# Patient Record
Sex: Male | Born: 1935 | Race: White | Hispanic: No | Smoking: Former smoker
Health system: Southern US, Community
[De-identification: ages and names within clinical notes are randomized; demographics above are authoritative.]

## PROBLEM LIST (undated history)

## (undated) DIAGNOSIS — N400 Enlarged prostate without lower urinary tract symptoms: Secondary | ICD-10-CM

## (undated) DIAGNOSIS — I10 Essential (primary) hypertension: Secondary | ICD-10-CM

## (undated) DIAGNOSIS — Z9981 Dependence on supplemental oxygen: Secondary | ICD-10-CM

## (undated) DIAGNOSIS — F32A Depression, unspecified: Secondary | ICD-10-CM

## (undated) DIAGNOSIS — K219 Gastro-esophageal reflux disease without esophagitis: Secondary | ICD-10-CM

## (undated) DIAGNOSIS — F329 Major depressive disorder, single episode, unspecified: Secondary | ICD-10-CM

## (undated) DIAGNOSIS — K269 Duodenal ulcer, unspecified as acute or chronic, without hemorrhage or perforation: Secondary | ICD-10-CM

## (undated) DIAGNOSIS — J449 Chronic obstructive pulmonary disease, unspecified: Secondary | ICD-10-CM

## (undated) DIAGNOSIS — J45909 Unspecified asthma, uncomplicated: Secondary | ICD-10-CM

## (undated) HISTORY — PX: HIP HARDWARE REMOVAL: SUR1127

## (undated) HISTORY — PX: GASTRIC ROUX-EN-Y: SHX5262

## (undated) HISTORY — PX: PARTIAL GASTRECTOMY: SHX2172

## (undated) HISTORY — PX: FRACTURE SURGERY: SHX138

## (undated) HISTORY — PX: CATARACT EXTRACTION, BILATERAL: SHX1313

---

## 1998-03-27 ENCOUNTER — Encounter: Payer: Self-pay | Admitting: Emergency Medicine

## 1998-03-28 ENCOUNTER — Encounter: Payer: Self-pay | Admitting: Specialist

## 1998-03-28 ENCOUNTER — Inpatient Hospital Stay (HOSPITAL_COMMUNITY): Admission: EM | Admit: 1998-03-28 | Discharge: 1998-03-30 | Payer: Self-pay | Admitting: Emergency Medicine

## 1998-03-30 ENCOUNTER — Inpatient Hospital Stay (HOSPITAL_COMMUNITY)
Admission: RE | Admit: 1998-03-30 | Discharge: 1998-04-12 | Payer: Self-pay | Admitting: Physical Medicine and Rehabilitation

## 1998-03-30 ENCOUNTER — Encounter: Payer: Self-pay | Admitting: Physical Medicine and Rehabilitation

## 1998-04-02 ENCOUNTER — Encounter: Payer: Self-pay | Admitting: Physical Medicine and Rehabilitation

## 1998-04-04 ENCOUNTER — Encounter: Payer: Self-pay | Admitting: Specialist

## 1998-04-10 ENCOUNTER — Encounter: Payer: Self-pay | Admitting: Physical Medicine and Rehabilitation

## 1998-08-29 ENCOUNTER — Encounter: Admission: RE | Admit: 1998-08-29 | Discharge: 1998-09-28 | Payer: Self-pay | Admitting: Specialist

## 1998-11-02 HISTORY — PX: HIP FRACTURE SURGERY: SHX118

## 2000-08-26 ENCOUNTER — Encounter: Payer: Self-pay | Admitting: Specialist

## 2000-08-28 ENCOUNTER — Encounter: Payer: Self-pay | Admitting: Specialist

## 2000-08-28 ENCOUNTER — Inpatient Hospital Stay (HOSPITAL_COMMUNITY): Admission: RE | Admit: 2000-08-28 | Discharge: 2000-09-01 | Payer: Self-pay | Admitting: Specialist

## 2000-10-13 ENCOUNTER — Encounter: Admission: RE | Admit: 2000-10-13 | Discharge: 2000-10-29 | Payer: Self-pay | Admitting: Specialist

## 2005-08-18 ENCOUNTER — Encounter: Admission: RE | Admit: 2005-08-18 | Discharge: 2005-08-18 | Payer: Self-pay | Admitting: Orthopaedic Surgery

## 2005-09-17 ENCOUNTER — Encounter (INDEPENDENT_AMBULATORY_CARE_PROVIDER_SITE_OTHER): Payer: Self-pay | Admitting: Specialist

## 2005-09-17 ENCOUNTER — Ambulatory Visit (HOSPITAL_COMMUNITY): Admission: RE | Admit: 2005-09-17 | Discharge: 2005-09-18 | Payer: Self-pay | Admitting: Orthopaedic Surgery

## 2007-02-12 ENCOUNTER — Encounter: Admission: RE | Admit: 2007-02-12 | Discharge: 2007-02-12 | Payer: Self-pay | Admitting: Unknown Physician Specialty

## 2009-01-09 ENCOUNTER — Encounter: Admission: RE | Admit: 2009-01-09 | Discharge: 2009-02-21 | Payer: Self-pay | Admitting: Sports Medicine

## 2010-03-24 ENCOUNTER — Encounter: Payer: Self-pay | Admitting: Unknown Physician Specialty

## 2010-05-30 ENCOUNTER — Other Ambulatory Visit: Payer: Self-pay | Admitting: Sports Medicine

## 2010-06-05 ENCOUNTER — Ambulatory Visit
Admission: RE | Admit: 2010-06-05 | Discharge: 2010-06-05 | Disposition: A | Discharge status: Home / Self Care | Payer: Medicare Other | Source: Ambulatory Visit | Attending: Sports Medicine | Admitting: Sports Medicine

## 2010-07-19 NOTE — H&P (Signed)
[place]. [facility]  Patient:    Neil Terry, Neil Terry                         MRN: [id number] Adm. Date:  08/28/00 Attending:  Kerrin Champagne, M.D. Dictator:   Verlin Fester, P.A.-C. CC:         Minna Antis, P.A.-C.; [facility]   History and Physical  DATE OF BIRTH:  12/21/1927  CHIEF COMPLAINT:  Left hip pain.  HISTORY OF PRESENT ILLNESS:  Left hip pain secondary to a compression screw and sideplate placement about 2 years and 4 months ago.  This has been causing him significant pain and irritation since that point and has not improved significantly.  It is now interfering with the quality of his living and he desires to have it removed.  ALLERGIES:  No known drug allergies.  MEDICATIONS:  Darvocet p.r.n. pain, over-the-counter laxatives.  PAST MEDICAL HISTORY:  Patient is healthy.  Denies any medical problems.  PAST SURGICAL HISTORY:  Hernia repair 30-40 years ago.  SOCIAL HISTORY:  He denies current tobacco use; however, he did quit smoking two years ago.  Previous to that he had smoked three to four packs per day. He denies any alcohol use.  He lives locally.  He has five children who live locally and he rotates between living with all of them.  They will all be around and willing to help him and support him after surgery with his needs.  FAMILY HISTORY:  Unknown secondary to his family living in [place] and it is unknown as to what the causes of death of his parents were.  All of his children are healthy.  REVIEW OF SYSTEMS:  Patient denies fever, chills, night sweats, or bleeding tendencies.  He denies any recent blurred vision, double vision, seizures, headaches, or paralysis.  Denies any shortness of breath, productive cough, or hemoptysis.  Denies chest pain, angina, orthopnea, or claudication.  Denies nausea, vomiting, diarrhea, constipation, melena, or blood stool.  Denies dysuria, hematuria, discharge.  Denies any  paraesthesias or numbness in the extremities or any weakness in his extremities.  PHYSICAL EXAMINATION  GENERAL:  Patient is a 77 year old male who is alert and oriented in no acute distress cooperative and pleasant to examination.  VITAL SIGNS:  Blood pressure 122/82, respirations 14 and unlabored, pulse 60 and irregular.  HEENT:  Head is normocephalic, nontraumatic.  Extraocular movements are intact.  NECK:  No bruits appreciated.  Soft and supple to palpation.  No thyromegaly.  CHEST:  Clear to auscultation bilaterally anterior and posterior.  BREASTS:  Not pertinent and not performed.  HEART:  Irregularly irregular rhythm.  No murmurs, gallops, or rubs appreciated.  Patient denied any knowledge of any irregular heartbeat.  ABDOMEN:  Positive bowel sounds throughout, nontender, nondistended.  No organomegaly noted.  Soft and supple to palpation.  GENITOURINARY:  Not pertinent and not performed.  EXTREMITIES:  Range of motion of the left hip is pain-free.  He is tender directly over the area of left compression screw and sideplate.  Reflexes at the knee are 2+ and equal bilaterally and at the ankle are present and equal bilaterally.  There is no motor deficits appreciated.  SKIN:  Intact without any rashes or lesions.  LABORATORIES:  X-rays reveal very prominent of plate over the lateral aspect of proximal femur on the hip and a healed intertrochanteric fracture.  PLAN:  Admit to [facility] for  removal of plate and screws and Allograft cancellous bone grafting to the hip.  This is to be done by Dr. Vira Browns.  I spoke with the patient and his son regarding the irregular heart rhythm and asked him to contact their primary care physician who I believe is Minna Antis, P.A.-C. to have his recent EKG faxed to [place] here so we can compare that to his pre admission EKG.  IMPRESSION: 1. Irritation secondary to a left hip compression screw and sideplate. 2.  Irregularly irregular heartbeat which was previously undiagnosed as far as    the patient is aware. DD:  08/24/00 TD:  08/24/00 Job: [id number]

## 2010-07-19 NOTE — Discharge Summary (Signed)
[place]. [facility]  Patient:    Neil Terry, Neil Terry                         MRN: [id number] Adm. Date:  08/28/00 Disc. Date: 09/01/00 Attending:  Kerrin Champagne, M.D. Dictator:   Dorie Rank, P.A.                           Discharge Summary  ADMISSION DIAGNOSES: 1. Painful hardware to the left lateral hip. 2. Gastroesophageal reflux disease.  DISCHARGE DIAGNOSES: 1. Status post removal of hardware to the left hip with allograft bone graft    to the left proximal femur. 2. Gastroesophageal reflux disease.  PROCEDURES:  On August 28, 2000, the patient underwent removal of deep hardware to the left proximal femur, compression screw and sideplate with allograft bone graft to the proximal femur.  The surgeon was Kerrin Champagne, M.D.  The anesthesia was general.  The assistant was ______, P.A.-C.  The estimated blood loss was 50 cc.  COMPLICATIONS:  None.  CONSULTS:  None.  BRIEF HISTORY:  The patient is a 77 year old male who sustained a fracture of the left hip almost two years ago.  He was treated initially with open reduction and internal fixation using compression screw and sideplate.  He did well postoperatively [place]  Several months later, he returned back with compression fractures and was noted to have osteoporosis.  He was then treated with IV ______.  He was admitted for the above-stated procedure due to the fact that he had persistent pain over the left proximal thigh in the region of his internal fixation with compression screw and sideplate.  After radiographs revealed healing of the intertrochanteric fracture, he was brought to the operating room for removal.  HOSPITAL COURSE:  The patient was brought to the operating room on August 28, 2000, and had the above-stated procedure without complications. Postoperatively, he was utilizing IV morphine for pain.  Prior to discharge, he was utilizing Percocet, which was  adequately controlling his pain.  He was 50% partial weightbearing to the left lower extremity postoperatively. Initially an IV of D5 normal saline was utilized.  This was KVO by postoperative day #2 when he was taking p.o. well.  He was given a regular diet and tolerated this well.  He utilized TED hose for DVT prophylaxis.  He was also placed on Lovenox for DVT prophylaxis while in the hospital. Physical therapy worked with him for ambulation.  The wound was dressed in a sterile fashion while in the operating room.  On postoperative day #1, the dressing was clean, dry, and intact.  The dressing was changed postoperative day #2 and daily thereafter and found to be free of any erythema or drainage. IV Ancef was used for 24 hours for postoperative infection control.  The patient progressed well with physical therapy and was ambulating up to 100 feet prior to discharge.  He did have some swelling to the left thigh, so a bilateral venous Doppler was obtained and was found to be negative. Discharge planning worked with him to obtain a home health agency for physical therapy and durable medical equipment.  On September 01, 2000, he was felt to be medically and orthopedically stable for discharge.  LABORATORY VALUES:  A CBC on August 26, 2000, was within normal limits.  PT and INR on August 26, 2000, were within normal  limits.  A BMET on August 26, 2000, was normal.  Liver functions on August 26, 2000, were normal.  A UA on August 26, 2000, was negative.  RADIOLOGY:  Left hip x-ray status post hardware removal on August 28, 2000, revealed satisfactory appearance of the left hip after hardware removal.  No EKG on chart.  DISCHARGE PLANS AND MEDICATIONS:  The patient was to be discharged home.  He was to have home health physical therapy for 50% partial weightbearing to the left lower extremity.  He is to follow up with Kerrin Champagne, M.D., in 10 days; call for an appointment.  Diet was regular.  He was  given prescriptions for Percocet and Robaxin.  He was to take over-the-counter aspirin for DVT prophylaxis.  Daily dressing changes.  He was to apply the Ace wrap to the left thigh as needed for swelling.  For any drainage or foul smell to the wound to the left hip, call the office immediately.  He was to take Tylenol for fevers and if greater than 101 degrees, call the office.  CONDITION ON DISCHARGE:  Improved. DD:  09/14/00 TD:  09/14/00 Job: [id number]

## 2010-07-19 NOTE — Op Note (Signed)
[name], [name] NO.:  [id number]   MEDICAL RECORD NO.:  [id number]          PATIENT TYPE:  OIB   LOCATION:  5041                         FACILITY:  [facility]   PHYSICIAN:  Sharolyn Douglas, M.D.        DATE OF BIRTH:  09-03-1927   DATE OF PROCEDURE:  DATE OF DISCHARGE:                                 OPERATIVE REPORT   DIAGNOSIS:  Lumbar compression fractures L2, L3 and L4.   PROCEDURE:  1.  Kyphoplasty L2, L3 and L4.  2.  Radiographic interpretation of fluoroscopic images used for instrument      placement during kyphoplasty.   SURGEON:  Sharolyn Douglas, MD   ASSISTANT:  None.   ANESTHESIA:  General endotracheal.   ESTIMATED BLOOD LOSS:  Minimal.   COMPLICATIONS:  None.   NEEDLE AND SPONGE COUNT:  Correct.   SPECIMEN TO PATHOLOGY:  From the L2, L3 and L4 vertebral bodies.   INDICATIONS:  The patient is a pleasant 77 year old ethnic male with  persistent back pain.  His radiographs and MRI scan show subacute  compression fractures at L2, L3 and L4.  Presents for kyphoplasty at these  levels in hopes of improving his symptoms.  Risk and benefits were reviewed.   PROCEDURE:  The patient was identified in the holding area, taken to the  operating room underwent general endotracheal anesthesia without difficulty,  given prophylactic IV antibiotics.  Carefully turned prone with bolsters  placed under his hips and pelvis and the chest to allow for a postural  reduction of his deformities.  The back was prepped, draped usual sterile  fashion.  Biplanar fluoroscopy was brought into the field.  True AP and  lateral images of the L2, L3 and L4 vertebral bodies were obtained.  A small  stab incisions were made to the lateral aspect of the left L3-L4 pedicles  and to the lateral aspect of the right L2 pedicle.  Jamshidi needles were  used to cannulate the pedicles of the L2-L3 and L4 vertebral bodies using  biplanar fluoroscopy.  A guidewire was placed through the  Jamshidi needle  and then a working cannula established.  Core bone biopsies were taken  through the working cannula and sent to pathology at each level.  We then  inserted the intervertebral bone tamp to the working cannula and inflated  each tamp to 6 mL.  We observed a partial reduction of vertebral endplates.  Partially hardened methyl methacrylate cement was then pushed through the  working cannulas into the vertebral bodies.  A total of 6 mL of bone cement  was utilized at each level.  The working cannulas were removed and final AP  and lateral images saved.  The skin was closed using a simple nylon suture at each stab incision site.  Sterile dressing was applied.  The patient was turned supine, extubated  without difficulty and transferred to recovery in stable condition able to  move his upper and lower extremities.      Sharolyn Douglas, M.D.  Electronically Signed     MC/MEDQ  D:  09/17/2005  T:  09/18/2005  Job:  [id number]

## 2010-07-19 NOTE — Op Note (Signed)
[place]. [facility]  Patient:    Neil Terry, Neil Terry                         MRN: [id number] Proc. Date: 08/28/00 Adm. Date:  [id number] Attending:  Lubertha South                           Operative Report  PREOPERATIVE DIAGNOSIS:  Left intertrochanteric hip fracture open reduction internal fixation on 03/28/00, now healed with painful hardware.  POSTOPERATIVE DIAGNOSIS:  Bursa overlying left intertrochanteric hip fracture open reduction internal fixation with plate and screws, healed left intertrochanteric hip fracture.  PROCEDURE PERFORMED: 1. Removal of deep hardware left proximal femur (compression plate    and side screw with five screws). 2. Allograft bone grafting to the proximal femur.  SURGEON:  Kerrin Champagne, M.D.  ASSISTANT:  Haze Rushing, P.A.-C.  ANESTHESIA:  General orotracheal tube.  ANESTHESIOLOGIST:  Halford Decamp, M.D.  ESTIMATED BLOOD LOSS:  50 cc.  INDICATIONS:  A 77 year old male who sustained a fracture of his left hip in a fall almost two years ago.  He was treated initially with open reduction internal fixation using compression screw and side plate.  He did well postoperatively [place] and several months later returned back with compression fractures and noted to have osteoporosis.  He is now treated with IV biphosphanate treatment.  He is brought to the operating room because of persistent pain over the left proximal thigh in the region of his internal fixation with compression screw and side plate.  He has an injection, which only temporarized his discomfort.  He is brought back to the operating room to have removal of hardware after radiographic demonstration of his healed intertrochanteric fracture.  FINDINGS:  The patient was found to have bursa overlying the left intertrochanteric fixation device.  This was resected as best as possible. Plates and screws were removed along the  compression screw without difficulty.  DESCRIPTION OF PROCEDURE:  After adequate general anesthesia with the patient in the right lateral decubitus position, left side up, chest roll, all pressure points well padded.  The standard prep with Duraprep solution from the left knee to the lower rib margin and draped in the usual manner.  The incision through the old previous incision scar approximately 10-12 cm in length.  Down through the skin and subcutaneous layers down to the tensor fascia lata laterally.  This was then incised in line with its fibers laterally.  The interval developed between the fascia lata and the superficial fascia layer of the vastus lateralis.  The vastus lateralis then incised along its posterior border and then blunt dissection used to carefully reflect the vastus lateralis anteriorly off the plate and screws.  An incision was then made over the plate and a screw through the bursa found to be present using electrocautery, and then a Cobb elevator used to elevate this circumferentially.  Each of the five screws were then individually removed with 4.5 screwdriver.  The plate then loosed off the lateral aspect of the femur using a Cobb elevator and mallet.  Care was taken not to cause any bony abnormality with this.  The plate was then carefully removed.  The compression screw was removed using the T-handle inserting screwdriver.  This was first turned one turn and then backed out of the proximal large hole in the femur without difficulty.  Irrigation was then performed, then the bursa cavity overlying the previous plate was resected using electrocautery and each of the individual screw holes were debrided using a bare rongeur.  The edges that had bone that had built around the plate were then smoothed with the bare rongeur, but basically left intact as they as they provided some degree of longitudinal support and I-beam type tensioning to the lateral column of the  hip.  The proximal hole for the compression screw was then treated with curettage and then irrigation, and then allograft bone graft packed through a 10 cc syringe with the end of it cut off, and impacted into place.  Excess bone then removed.  Irrigation performed and then the superficial fascia layer of the vastus lateralis reapproximated with a running stitch of 0 Vicryl.  The tensor fascia lata was approximated with a running stitch of #1 Vicryl.  The subcu layer was approximated with interrupted 0 Vicryl suture and the more superficial layers with interrupted 2-0 Vicryl sutures.  The skin was closed with a running stitch of 4-0 Monocryl buried at each end.  Tincture of Benzoin and Steri-Strips were applied with 4 x 4s, ABD pads fixed to the skin with tape.  All instrument and sponge counts were correct.  The plates and screws were flashed and then placed on the patients chart to be returned to the patient.  The patient was then reactivated and returned to the recovery room in satisfactory condition. DD:  08/28/00 TD:  08/28/00 Job: [id number]

## 2010-07-19 NOTE — Op Note (Signed)
[name], Neil Terry NO.:  [id number]   MEDICAL RECORD NO.:  [id number]          PATIENT TYPE:  OIB   LOCATION:  5041                         FACILITY:  [facility]   PHYSICIAN:  Sharolyn Douglas, M.D.        DATE OF BIRTH:  10-21-1927   DATE OF PROCEDURE:  DATE OF DISCHARGE:                                 OPERATIVE REPORT      Max Noel Gerold, M.D.  Electronically Signed     MC/MEDQ  D:  09/17/2005  T:  09/18/2005  Job:  [id number]

## 2011-04-25 DIAGNOSIS — H04129 Dry eye syndrome of unspecified lacrimal gland: Secondary | ICD-10-CM | POA: Diagnosis not present

## 2011-05-28 DIAGNOSIS — M25569 Pain in unspecified knee: Secondary | ICD-10-CM | POA: Diagnosis not present

## 2011-05-28 DIAGNOSIS — M1710 Unilateral primary osteoarthritis, unspecified knee: Secondary | ICD-10-CM | POA: Diagnosis not present

## 2011-07-12 DIAGNOSIS — M13169 Monoarthritis, not elsewhere classified, unspecified knee: Secondary | ICD-10-CM | POA: Diagnosis not present

## 2012-02-23 HISTORY — PX: ABDOMINAL EXPLORATION SURGERY: SHX538

## 2013-03-10 DIAGNOSIS — R141 Gas pain: Secondary | ICD-10-CM | POA: Diagnosis not present

## 2013-03-10 DIAGNOSIS — K439 Ventral hernia without obstruction or gangrene: Secondary | ICD-10-CM | POA: Diagnosis not present

## 2013-03-17 DIAGNOSIS — R109 Unspecified abdominal pain: Secondary | ICD-10-CM | POA: Diagnosis not present

## 2013-03-17 DIAGNOSIS — K432 Incisional hernia without obstruction or gangrene: Secondary | ICD-10-CM | POA: Diagnosis not present

## 2013-03-25 DIAGNOSIS — N1330 Unspecified hydronephrosis: Secondary | ICD-10-CM | POA: Diagnosis not present

## 2013-03-25 DIAGNOSIS — R109 Unspecified abdominal pain: Secondary | ICD-10-CM | POA: Diagnosis not present

## 2013-03-25 DIAGNOSIS — K432 Incisional hernia without obstruction or gangrene: Secondary | ICD-10-CM | POA: Diagnosis not present

## 2013-04-01 DIAGNOSIS — N134 Hydroureter: Secondary | ICD-10-CM | POA: Diagnosis not present

## 2013-04-06 DIAGNOSIS — R339 Retention of urine, unspecified: Secondary | ICD-10-CM | POA: Diagnosis not present

## 2013-04-06 DIAGNOSIS — N401 Enlarged prostate with lower urinary tract symptoms: Secondary | ICD-10-CM | POA: Diagnosis not present

## 2013-04-06 DIAGNOSIS — N138 Other obstructive and reflux uropathy: Secondary | ICD-10-CM | POA: Diagnosis not present

## 2013-04-20 DIAGNOSIS — N390 Urinary tract infection, site not specified: Secondary | ICD-10-CM | POA: Diagnosis not present

## 2013-04-20 DIAGNOSIS — N401 Enlarged prostate with lower urinary tract symptoms: Secondary | ICD-10-CM | POA: Diagnosis not present

## 2013-04-26 DIAGNOSIS — R339 Retention of urine, unspecified: Secondary | ICD-10-CM | POA: Diagnosis not present

## 2013-05-06 DIAGNOSIS — R339 Retention of urine, unspecified: Secondary | ICD-10-CM | POA: Diagnosis not present

## 2013-05-06 DIAGNOSIS — N401 Enlarged prostate with lower urinary tract symptoms: Secondary | ICD-10-CM | POA: Diagnosis not present

## 2013-05-24 DIAGNOSIS — J449 Chronic obstructive pulmonary disease, unspecified: Secondary | ICD-10-CM | POA: Diagnosis not present

## 2013-05-24 DIAGNOSIS — Z87891 Personal history of nicotine dependence: Secondary | ICD-10-CM | POA: Diagnosis not present

## 2013-05-24 DIAGNOSIS — N401 Enlarged prostate with lower urinary tract symptoms: Secondary | ICD-10-CM | POA: Diagnosis not present

## 2013-05-24 DIAGNOSIS — G4730 Sleep apnea, unspecified: Secondary | ICD-10-CM | POA: Diagnosis not present

## 2013-05-24 DIAGNOSIS — R339 Retention of urine, unspecified: Secondary | ICD-10-CM | POA: Diagnosis not present

## 2013-05-24 DIAGNOSIS — K259 Gastric ulcer, unspecified as acute or chronic, without hemorrhage or perforation: Secondary | ICD-10-CM | POA: Diagnosis not present

## 2013-05-24 DIAGNOSIS — N3289 Other specified disorders of bladder: Secondary | ICD-10-CM | POA: Diagnosis not present

## 2013-05-24 DIAGNOSIS — N390 Urinary tract infection, site not specified: Secondary | ICD-10-CM | POA: Diagnosis not present

## 2013-05-24 DIAGNOSIS — N323 Diverticulum of bladder: Secondary | ICD-10-CM | POA: Diagnosis not present

## 2013-05-26 DIAGNOSIS — N390 Urinary tract infection, site not specified: Secondary | ICD-10-CM | POA: Diagnosis not present

## 2013-05-26 DIAGNOSIS — R339 Retention of urine, unspecified: Secondary | ICD-10-CM | POA: Diagnosis not present

## 2013-05-26 DIAGNOSIS — N401 Enlarged prostate with lower urinary tract symptoms: Secondary | ICD-10-CM | POA: Diagnosis not present

## 2013-05-26 DIAGNOSIS — K259 Gastric ulcer, unspecified as acute or chronic, without hemorrhage or perforation: Secondary | ICD-10-CM | POA: Diagnosis not present

## 2013-05-26 DIAGNOSIS — N138 Other obstructive and reflux uropathy: Secondary | ICD-10-CM | POA: Diagnosis not present

## 2013-05-26 DIAGNOSIS — N323 Diverticulum of bladder: Secondary | ICD-10-CM | POA: Diagnosis not present

## 2013-05-26 DIAGNOSIS — G4730 Sleep apnea, unspecified: Secondary | ICD-10-CM | POA: Diagnosis not present

## 2013-05-26 DIAGNOSIS — N3289 Other specified disorders of bladder: Secondary | ICD-10-CM | POA: Diagnosis not present

## 2013-05-27 DIAGNOSIS — N3289 Other specified disorders of bladder: Secondary | ICD-10-CM | POA: Diagnosis not present

## 2013-05-27 DIAGNOSIS — N401 Enlarged prostate with lower urinary tract symptoms: Secondary | ICD-10-CM | POA: Diagnosis not present

## 2013-05-27 DIAGNOSIS — N323 Diverticulum of bladder: Secondary | ICD-10-CM | POA: Diagnosis not present

## 2013-05-27 DIAGNOSIS — R339 Retention of urine, unspecified: Secondary | ICD-10-CM | POA: Diagnosis not present

## 2013-05-27 DIAGNOSIS — K259 Gastric ulcer, unspecified as acute or chronic, without hemorrhage or perforation: Secondary | ICD-10-CM | POA: Diagnosis not present

## 2013-05-27 DIAGNOSIS — G4730 Sleep apnea, unspecified: Secondary | ICD-10-CM | POA: Diagnosis not present

## 2013-06-14 DIAGNOSIS — N401 Enlarged prostate with lower urinary tract symptoms: Secondary | ICD-10-CM | POA: Diagnosis not present

## 2013-06-14 DIAGNOSIS — N138 Other obstructive and reflux uropathy: Secondary | ICD-10-CM | POA: Diagnosis not present

## 2013-06-14 DIAGNOSIS — N390 Urinary tract infection, site not specified: Secondary | ICD-10-CM | POA: Diagnosis not present

## 2013-06-14 DIAGNOSIS — N320 Bladder-neck obstruction: Secondary | ICD-10-CM | POA: Diagnosis not present

## 2013-07-05 DIAGNOSIS — H01029 Squamous blepharitis unspecified eye, unspecified eyelid: Secondary | ICD-10-CM | POA: Diagnosis not present

## 2013-07-05 DIAGNOSIS — H353190 Nonexudative age-related macular degeneration, unspecified eye, stage unspecified: Secondary | ICD-10-CM | POA: Diagnosis not present

## 2013-07-05 DIAGNOSIS — H04129 Dry eye syndrome of unspecified lacrimal gland: Secondary | ICD-10-CM | POA: Diagnosis not present

## 2013-07-05 DIAGNOSIS — H26499 Other secondary cataract, unspecified eye: Secondary | ICD-10-CM | POA: Diagnosis not present

## 2013-07-21 DIAGNOSIS — R339 Retention of urine, unspecified: Secondary | ICD-10-CM | POA: Diagnosis not present

## 2013-10-12 DIAGNOSIS — R109 Unspecified abdominal pain: Secondary | ICD-10-CM | POA: Diagnosis not present

## 2013-10-12 DIAGNOSIS — K432 Incisional hernia without obstruction or gangrene: Secondary | ICD-10-CM | POA: Diagnosis not present

## 2013-10-18 DIAGNOSIS — K432 Incisional hernia without obstruction or gangrene: Secondary | ICD-10-CM | POA: Diagnosis not present

## 2013-10-18 DIAGNOSIS — J449 Chronic obstructive pulmonary disease, unspecified: Secondary | ICD-10-CM | POA: Diagnosis not present

## 2013-10-18 DIAGNOSIS — R109 Unspecified abdominal pain: Secondary | ICD-10-CM | POA: Diagnosis not present

## 2013-10-18 DIAGNOSIS — Z8711 Personal history of peptic ulcer disease: Secondary | ICD-10-CM | POA: Diagnosis not present

## 2013-10-18 DIAGNOSIS — R1013 Epigastric pain: Secondary | ICD-10-CM | POA: Diagnosis not present

## 2013-10-18 DIAGNOSIS — Z87891 Personal history of nicotine dependence: Secondary | ICD-10-CM | POA: Diagnosis not present

## 2013-10-18 DIAGNOSIS — Z79899 Other long term (current) drug therapy: Secondary | ICD-10-CM | POA: Diagnosis not present

## 2013-10-18 DIAGNOSIS — R112 Nausea with vomiting, unspecified: Secondary | ICD-10-CM | POA: Diagnosis not present

## 2013-10-18 DIAGNOSIS — M810 Age-related osteoporosis without current pathological fracture: Secondary | ICD-10-CM | POA: Diagnosis not present

## 2013-10-18 DIAGNOSIS — R1310 Dysphagia, unspecified: Secondary | ICD-10-CM | POA: Diagnosis not present

## 2013-10-18 DIAGNOSIS — Z9889 Other specified postprocedural states: Secondary | ICD-10-CM | POA: Diagnosis not present

## 2013-11-17 DIAGNOSIS — R0601 Orthopnea: Secondary | ICD-10-CM | POA: Diagnosis not present

## 2013-11-17 DIAGNOSIS — K219 Gastro-esophageal reflux disease without esophagitis: Secondary | ICD-10-CM | POA: Diagnosis not present

## 2013-11-17 DIAGNOSIS — K432 Incisional hernia without obstruction or gangrene: Secondary | ICD-10-CM | POA: Diagnosis not present

## 2013-11-17 DIAGNOSIS — K5900 Constipation, unspecified: Secondary | ICD-10-CM | POA: Diagnosis not present

## 2014-02-16 DIAGNOSIS — M79604 Pain in right leg: Secondary | ICD-10-CM | POA: Diagnosis not present

## 2014-02-16 DIAGNOSIS — M25561 Pain in right knee: Secondary | ICD-10-CM | POA: Diagnosis not present

## 2014-03-03 HISTORY — PX: TRANSURETHRAL RESECTION OF PROSTATE: SHX73

## 2014-03-17 DIAGNOSIS — M25561 Pain in right knee: Secondary | ICD-10-CM | POA: Diagnosis not present

## 2014-03-17 DIAGNOSIS — M79604 Pain in right leg: Secondary | ICD-10-CM | POA: Diagnosis not present

## 2014-03-17 DIAGNOSIS — Z8781 Personal history of (healed) traumatic fracture: Secondary | ICD-10-CM | POA: Diagnosis not present

## 2014-04-13 DIAGNOSIS — F329 Major depressive disorder, single episode, unspecified: Secondary | ICD-10-CM | POA: Diagnosis not present

## 2014-04-13 DIAGNOSIS — I10 Essential (primary) hypertension: Secondary | ICD-10-CM | POA: Diagnosis not present

## 2014-04-13 DIAGNOSIS — R319 Hematuria, unspecified: Secondary | ICD-10-CM | POA: Diagnosis not present

## 2014-05-11 DIAGNOSIS — I10 Essential (primary) hypertension: Secondary | ICD-10-CM | POA: Diagnosis not present

## 2014-05-11 DIAGNOSIS — J449 Chronic obstructive pulmonary disease, unspecified: Secondary | ICD-10-CM | POA: Diagnosis not present

## 2014-08-01 DIAGNOSIS — H01004 Unspecified blepharitis left upper eyelid: Secondary | ICD-10-CM | POA: Diagnosis not present

## 2014-08-01 DIAGNOSIS — H01005 Unspecified blepharitis left lower eyelid: Secondary | ICD-10-CM | POA: Diagnosis not present

## 2014-08-16 DIAGNOSIS — H01001 Unspecified blepharitis right upper eyelid: Secondary | ICD-10-CM | POA: Diagnosis not present

## 2014-08-16 DIAGNOSIS — H01002 Unspecified blepharitis right lower eyelid: Secondary | ICD-10-CM | POA: Diagnosis not present

## 2014-08-16 DIAGNOSIS — H01004 Unspecified blepharitis left upper eyelid: Secondary | ICD-10-CM | POA: Diagnosis not present

## 2014-08-16 DIAGNOSIS — H01005 Unspecified blepharitis left lower eyelid: Secondary | ICD-10-CM | POA: Diagnosis not present

## 2014-08-16 DIAGNOSIS — H3531 Nonexudative age-related macular degeneration: Secondary | ICD-10-CM | POA: Diagnosis not present

## 2014-11-07 ENCOUNTER — Emergency Department (HOSPITAL_COMMUNITY): Payer: Medicare Other

## 2014-11-07 ENCOUNTER — Inpatient Hospital Stay (HOSPITAL_COMMUNITY)
Admission: EM | Admit: 2014-11-07 | Discharge: 2014-11-12 | DRG: 555 | Disposition: A | Discharge status: Home Health | Payer: Medicare Other | Attending: Internal Medicine | Admitting: Internal Medicine

## 2014-11-07 ENCOUNTER — Encounter (HOSPITAL_COMMUNITY): Payer: Self-pay | Admitting: Emergency Medicine

## 2014-11-07 DIAGNOSIS — N400 Enlarged prostate without lower urinary tract symptoms: Secondary | ICD-10-CM | POA: Diagnosis present

## 2014-11-07 DIAGNOSIS — K8020 Calculus of gallbladder without cholecystitis without obstruction: Secondary | ICD-10-CM | POA: Diagnosis not present

## 2014-11-07 DIAGNOSIS — R918 Other nonspecific abnormal finding of lung field: Secondary | ICD-10-CM | POA: Diagnosis not present

## 2014-11-07 DIAGNOSIS — R330 Drug induced retention of urine: Secondary | ICD-10-CM | POA: Diagnosis present

## 2014-11-07 DIAGNOSIS — I959 Hypotension, unspecified: Secondary | ICD-10-CM | POA: Diagnosis not present

## 2014-11-07 DIAGNOSIS — R109 Unspecified abdominal pain: Secondary | ICD-10-CM

## 2014-11-07 DIAGNOSIS — J9621 Acute and chronic respiratory failure with hypoxia: Secondary | ICD-10-CM

## 2014-11-07 DIAGNOSIS — I248 Other forms of acute ischemic heart disease: Secondary | ICD-10-CM | POA: Diagnosis present

## 2014-11-07 DIAGNOSIS — T40605A Adverse effect of unspecified narcotics, initial encounter: Secondary | ICD-10-CM | POA: Diagnosis present

## 2014-11-07 DIAGNOSIS — R0789 Other chest pain: Secondary | ICD-10-CM

## 2014-11-07 DIAGNOSIS — K56609 Unspecified intestinal obstruction, unspecified as to partial versus complete obstruction: Secondary | ICD-10-CM

## 2014-11-07 DIAGNOSIS — D72829 Elevated white blood cell count, unspecified: Secondary | ICD-10-CM | POA: Diagnosis present

## 2014-11-07 DIAGNOSIS — I714 Abdominal aortic aneurysm, without rupture, unspecified: Secondary | ICD-10-CM

## 2014-11-07 DIAGNOSIS — E871 Hypo-osmolality and hyponatremia: Secondary | ICD-10-CM | POA: Diagnosis not present

## 2014-11-07 DIAGNOSIS — M79604 Pain in right leg: Secondary | ICD-10-CM

## 2014-11-07 DIAGNOSIS — Z23 Encounter for immunization: Secondary | ICD-10-CM

## 2014-11-07 DIAGNOSIS — Z87891 Personal history of nicotine dependence: Secondary | ICD-10-CM

## 2014-11-07 DIAGNOSIS — I493 Ventricular premature depolarization: Secondary | ICD-10-CM | POA: Diagnosis not present

## 2014-11-07 DIAGNOSIS — I509 Heart failure, unspecified: Secondary | ICD-10-CM | POA: Diagnosis not present

## 2014-11-07 DIAGNOSIS — R262 Difficulty in walking, not elsewhere classified: Secondary | ICD-10-CM | POA: Diagnosis present

## 2014-11-07 DIAGNOSIS — K219 Gastro-esophageal reflux disease without esophagitis: Secondary | ICD-10-CM | POA: Diagnosis present

## 2014-11-07 DIAGNOSIS — R079 Chest pain, unspecified: Secondary | ICD-10-CM

## 2014-11-07 DIAGNOSIS — K7689 Other specified diseases of liver: Secondary | ICD-10-CM | POA: Diagnosis not present

## 2014-11-07 DIAGNOSIS — J9601 Acute respiratory failure with hypoxia: Secondary | ICD-10-CM

## 2014-11-07 DIAGNOSIS — R338 Other retention of urine: Secondary | ICD-10-CM

## 2014-11-07 DIAGNOSIS — J441 Chronic obstructive pulmonary disease with (acute) exacerbation: Secondary | ICD-10-CM | POA: Diagnosis present

## 2014-11-07 DIAGNOSIS — I10 Essential (primary) hypertension: Secondary | ICD-10-CM | POA: Diagnosis present

## 2014-11-07 DIAGNOSIS — J189 Pneumonia, unspecified organism: Secondary | ICD-10-CM | POA: Diagnosis not present

## 2014-11-07 DIAGNOSIS — I480 Paroxysmal atrial fibrillation: Secondary | ICD-10-CM | POA: Diagnosis not present

## 2014-11-07 DIAGNOSIS — N281 Cyst of kidney, acquired: Secondary | ICD-10-CM | POA: Diagnosis present

## 2014-11-07 DIAGNOSIS — R1011 Right upper quadrant pain: Secondary | ICD-10-CM | POA: Diagnosis not present

## 2014-11-07 DIAGNOSIS — R112 Nausea with vomiting, unspecified: Secondary | ICD-10-CM

## 2014-11-07 DIAGNOSIS — R1013 Epigastric pain: Secondary | ICD-10-CM | POA: Diagnosis not present

## 2014-11-07 DIAGNOSIS — K439 Ventral hernia without obstruction or gangrene: Secondary | ICD-10-CM

## 2014-11-07 DIAGNOSIS — R0902 Hypoxemia: Secondary | ICD-10-CM | POA: Insufficient documentation

## 2014-11-07 DIAGNOSIS — T380X5A Adverse effect of glucocorticoids and synthetic analogues, initial encounter: Secondary | ICD-10-CM | POA: Diagnosis present

## 2014-11-07 DIAGNOSIS — K838 Other specified diseases of biliary tract: Secondary | ICD-10-CM

## 2014-11-07 DIAGNOSIS — N1330 Unspecified hydronephrosis: Secondary | ICD-10-CM | POA: Diagnosis not present

## 2014-11-07 DIAGNOSIS — E860 Dehydration: Secondary | ICD-10-CM | POA: Diagnosis not present

## 2014-11-07 DIAGNOSIS — W19XXXA Unspecified fall, initial encounter: Secondary | ICD-10-CM | POA: Diagnosis present

## 2014-11-07 DIAGNOSIS — M25551 Pain in right hip: Secondary | ICD-10-CM | POA: Diagnosis not present

## 2014-11-07 DIAGNOSIS — I472 Ventricular tachycardia: Secondary | ICD-10-CM | POA: Diagnosis not present

## 2014-11-07 DIAGNOSIS — I4891 Unspecified atrial fibrillation: Secondary | ICD-10-CM

## 2014-11-07 DIAGNOSIS — R072 Precordial pain: Secondary | ICD-10-CM | POA: Diagnosis not present

## 2014-11-07 HISTORY — DX: Depression, unspecified: F32.A

## 2014-11-07 HISTORY — DX: Benign prostatic hyperplasia without lower urinary tract symptoms: N40.0

## 2014-11-07 HISTORY — DX: Dependence on supplemental oxygen: Z99.81

## 2014-11-07 HISTORY — DX: Gastro-esophageal reflux disease without esophagitis: K21.9

## 2014-11-07 HISTORY — DX: Chronic obstructive pulmonary disease, unspecified: J44.9

## 2014-11-07 HISTORY — DX: Duodenal ulcer, unspecified as acute or chronic, without hemorrhage or perforation: K26.9

## 2014-11-07 HISTORY — DX: Major depressive disorder, single episode, unspecified: F32.9

## 2014-11-07 HISTORY — DX: Essential (primary) hypertension: I10

## 2014-11-07 HISTORY — DX: Unspecified asthma, uncomplicated: J45.909

## 2014-11-07 LAB — COMPREHENSIVE METABOLIC PANEL
ALBUMIN: 4.1 g/dL (ref 3.5–5.0)
ALK PHOS: 89 U/L (ref 38–126)
ALT: 14 U/L — ABNORMAL LOW (ref 17–63)
AST: 26 U/L (ref 15–41)
Anion gap: 12 (ref 5–15)
BILIRUBIN TOTAL: 0.9 mg/dL (ref 0.3–1.2)
BUN: 13 mg/dL (ref 6–20)
CALCIUM: 9.1 mg/dL (ref 8.9–10.3)
CO2: 25 mmol/L (ref 22–32)
Chloride: 98 mmol/L — ABNORMAL LOW (ref 101–111)
Creatinine, Ser: 0.95 mg/dL (ref 0.61–1.24)
GFR calc Af Amer: >60 mL/min (ref >60)
GFR calc non Af Amer: >60 mL/min (ref >60)
GLUCOSE: 85 mg/dL (ref 65–99)
Potassium: 3.8 mmol/L (ref 3.5–5.1)
Sodium: 135 mmol/L (ref 135–145)
TOTAL PROTEIN: 8.2 g/dL — AB (ref 6.5–8.1)

## 2014-11-07 LAB — CBC WITH DIFFERENTIAL/PLATELET
BASOS ABS: 0 10*3/uL (ref 0.0–0.1)
BASOS PCT: 0 % (ref 0–1)
Eosinophils Absolute: 0 10*3/uL (ref 0.0–0.7)
Eosinophils Relative: 0 % (ref 0–5)
HEMATOCRIT: 49.1 % (ref 39.0–52.0)
HEMOGLOBIN: 16.1 g/dL (ref 13.0–17.0)
Lymphocytes Relative: 8 % — ABNORMAL LOW (ref 12–46)
Lymphs Abs: 1.2 10*3/uL (ref 0.7–4.0)
MCH: 29.4 pg (ref 26.0–34.0)
MCHC: 32.8 g/dL (ref 30.0–36.0)
MCV: 89.6 fL (ref 78.0–100.0)
MONOS PCT: 6 % (ref 3–12)
Monocytes Absolute: 0.8 10*3/uL (ref 0.1–1.0)
NEUTROS ABS: 13.1 10*3/uL — AB (ref 1.7–7.7)
NEUTROS PCT: 86 % — AB (ref 43–77)
Platelets: 225 10*3/uL (ref 150–400)
RBC: 5.48 MIL/uL (ref 4.22–5.81)
RDW: 13.5 % (ref 11.5–15.5)
WBC: 15.1 10*3/uL — ABNORMAL HIGH (ref 4.0–10.5)

## 2014-11-07 MED ORDER — HYDROMORPHONE HCL 1 MG/ML IJ SOLN
0.5000 mg | Freq: Once | INTRAMUSCULAR | Status: AC
  Administered 2014-11-07: 0.5 mg via INTRAVENOUS
  Filled 2014-11-07: qty 1

## 2014-11-07 MED ORDER — HYDROMORPHONE HCL 1 MG/ML IJ SOLN
1.0000 mg | Freq: Once | INTRAMUSCULAR | Status: AC
  Administered 2014-11-07: 1 mg via INTRAVENOUS
  Filled 2014-11-07: qty 1

## 2014-11-07 MED ORDER — FENTANYL CITRATE (PF) 100 MCG/2ML IJ SOLN
100.0000 ug | Freq: Once | INTRAMUSCULAR | Status: AC
  Administered 2014-11-07: 100 ug via INTRAVENOUS
  Filled 2014-11-07: qty 2

## 2014-11-07 MED ORDER — DIAZEPAM 5 MG/ML IJ SOLN
2.5000 mg | Freq: Once | INTRAMUSCULAR | Status: AC
  Administered 2014-11-07: 2.5 mg via INTRAVENOUS
  Filled 2014-11-07: qty 2

## 2014-11-07 NOTE — ED Notes (Signed)
PT unable to beear wt and take a step on hurt extrimity

## 2014-11-07 NOTE — ED Notes (Signed)
Patient transported to CT by CT tech

## 2014-11-07 NOTE — ED Provider Notes (Signed)
CSN: [id number]     Arrival date & time 11/07/14  1557 History   First MD Initiated Contact with Patient 11/07/14 1908     Chief Complaint  Patient presents with  . Hip Pain  . Fall     (Consider location/radiation/quality/duration/timing/severity/associated sxs/prior Treatment) Patient is a 81 y.o. male presenting with hip pain. The history is provided by the patient.  Hip Pain This is a new problem. The problem occurs constantly. The problem has not changed since onset.Pertinent negatives include no chest pain and no shortness of breath. Nothing aggravates the symptoms. Nothing relieves the symptoms. He has tried nothing for the symptoms. The treatment provided mild relief.    History reviewed. No pertinent past medical history. Past Surgical History  Procedure Laterality Date  . Joint replacement     History reviewed. No pertinent family history. Social History  Substance Use Topics  . Smoking status: Never Smoker   . Smokeless tobacco: None  . Alcohol Use: No    Review of Systems  Constitutional: Negative for activity change.  HENT: Negative for congestion, mouth sores and nosebleeds.   Eyes: Negative for pain and discharge.  Respiratory: Negative for shortness of breath.   Cardiovascular: Negative for chest pain.  Gastrointestinal: Negative for nausea and vomiting.  Endocrine: Negative for polydipsia and polyuria.  Musculoskeletal: Negative for myalgias and back pain.       Right hip and femur pain  Skin: Negative for pallor and rash.  Neurological: Negative for dizziness and numbness.      Allergies  Review of patient's allergies indicates no known allergies.  Home Medications   Prior to Admission medications   Medication Sig Start Date End Date Taking? Authorizing Provider  tobramycin-dexamethasone [facility]) ophthalmic solution Place 1 drop into both eyes every 4 (four) hours while awake.   Yes Historical Provider, MD   BP 125/61 mmHg  Pulse 82   Temp(Src) 97.7 F (36.5 C) (Oral)  Resp 23  SpO2 90% Physical Exam  Constitutional: He is oriented to person, place, and time. He appears well-developed and well-nourished.  HENT:  Head: Normocephalic and atraumatic.  Eyes: Conjunctivae and EOM are normal.  Neck: Normal range of motion. Neck supple.  Cardiovascular: Normal rate and regular rhythm.   Pulmonary/Chest: Effort normal. No respiratory distress.  Abdominal: Soft. There is no tenderness.  Musculoskeletal: Normal range of motion. He exhibits tenderness (right lateral hip and femur). He exhibits no edema.  Neurological: He is alert and oriented to person, place, and time.  Skin: Skin is warm and dry.  Nursing note and vitals reviewed.   ED Course  Procedures (including critical care time) Labs Review Labs Reviewed  CBC WITH DIFFERENTIAL/PLATELET - Abnormal; Notable for the following:    WBC 15.1 (*)    Neutrophils Relative % 86 (*)    Neutro Abs 13.1 (*)    Lymphocytes Relative 8 (*)    All other components within normal limits  COMPREHENSIVE METABOLIC PANEL - Abnormal; Notable for the following:    Chloride 98 (*)    Total Protein 8.2 (*)    ALT 14 (*)    All other components within normal limits    Imaging Review Ct Pelvis Wo Contrast  11/07/2014   CLINICAL DATA:  Right hip pain after a fall today.  EXAM: CT PELVIS AND RIGHT LOWER EXTREMITY WITHOUT CONTRAST  TECHNIQUE: Multidetector CT imaging of the pelvis and right lower extremity was performed according to the standard protocol without intravenous contrast. Multiplanar CT image  reconstructions were also generated.  COMPARISON:  Pelvis and right hip radiographs 11/07/2014  FINDINGS: CT PELVIS FINDINGS  Diffuse bone demineralization. The sacrum and SI joints appear intact and symmetrical. Pelvis appears intact. No acute displaced fractures identified. Symphysis pubis is not displaced. No focal bone lesion or bone destruction. Incidental note of cement at L3 consistent  with prior kyphoplasty. Compression of the superior and inferior endplates at L4. Changes are likely due to osteoporosis.  Soft tissue pelvis demonstrates vascular calcification in the abdominal aorta. There are multiple ventral abdominal wall hernias containing colon and small bowel. No visualized bowel obstruction. Bladder wall is mildly thickened which may be due to cystitis or hypertrophy from outlet obstruction. Prostate gland is enlarged and prostate calcifications are present.  CT RIGHT LOWER EXTREMITY FINDINGS  Diffuse bone demineralization. Previous intra medullary rod fixation of the femur with distal locking screw. No evidence of displacement of hardware components. Old healed fracture deformity of the inter trochanteric region. No evidence of acute fracture or dislocation of the right femur. No focal bone lesion or bone destruction. No significant effusion at the knee. Soft tissues of the right leg demonstrate diffuse muscular fatty atrophy. Scattered vascular calcifications in the superficial femoral artery. No focal fluid collection or hematoma.  IMPRESSION: Diffuse bone demineralization. Lower lumbar compression fractures consistent with osteoporosis. No acute fractures demonstrated in the pelvis or right femur. Previous intra medullary rod fixation of the right femur with and old healed fracture of the inter trochanteric region. Diffuse fatty atrophy of the right leg muscles.   Electronically Signed   By: Burman Nieves M.D.   On: 11/07/2014 22:16   Ct Femur Right Wo Contrast  11/07/2014   CLINICAL DATA:  Right hip pain after a fall today.  EXAM: CT PELVIS AND RIGHT LOWER EXTREMITY WITHOUT CONTRAST  TECHNIQUE: Multidetector CT imaging of the pelvis and right lower extremity was performed according to the standard protocol without intravenous contrast. Multiplanar CT image reconstructions were also generated.  COMPARISON:  Pelvis and right hip radiographs 11/07/2014  FINDINGS: CT PELVIS FINDINGS   Diffuse bone demineralization. The sacrum and SI joints appear intact and symmetrical. Pelvis appears intact. No acute displaced fractures identified. Symphysis pubis is not displaced. No focal bone lesion or bone destruction. Incidental note of cement at L3 consistent with prior kyphoplasty. Compression of the superior and inferior endplates at L4. Changes are likely due to osteoporosis.  Soft tissue pelvis demonstrates vascular calcification in the abdominal aorta. There are multiple ventral abdominal wall hernias containing colon and small bowel. No visualized bowel obstruction. Bladder wall is mildly thickened which may be due to cystitis or hypertrophy from outlet obstruction. Prostate gland is enlarged and prostate calcifications are present.  CT RIGHT LOWER EXTREMITY FINDINGS  Diffuse bone demineralization. Previous intra medullary rod fixation of the femur with distal locking screw. No evidence of displacement of hardware components. Old healed fracture deformity of the inter trochanteric region. No evidence of acute fracture or dislocation of the right femur. No focal bone lesion or bone destruction. No significant effusion at the knee. Soft tissues of the right leg demonstrate diffuse muscular fatty atrophy. Scattered vascular calcifications in the superficial femoral artery. No focal fluid collection or hematoma.  IMPRESSION: Diffuse bone demineralization. Lower lumbar compression fractures consistent with osteoporosis. No acute fractures demonstrated in the pelvis or right femur. Previous intra medullary rod fixation of the right femur with and old healed fracture of the inter trochanteric region. Diffuse fatty atrophy of the right  leg muscles.   Electronically Signed   By: Burman Nieves M.D.   On: 11/07/2014 22:16   Dg Hip Unilat With Pelvis 2-3 Views Right  11/07/2014   CLINICAL DATA:  Status post fall today with right-sided hip pain, history of hip fracture 6 years ago.  EXAM: DG HIP (WITH OR  WITHOUT PELVIS) 2-3V RIGHT  COMPARISON:  None.  FINDINGS: The bones are osteopenic. There is old deformity of the intertrochanteric region of the right hip. The femoral head is intact. The metallic rod is in reasonable position. The observed portions of the right hemipelvis exhibit no acute fracture. There is chronic deformity of the superior pubic ramus on the left.  IMPRESSION: There is no acute bony abnormality of the right hip or the visualized portions of the right hemipelvis.   Electronically Signed   By: David  [place] M.D.   On: 11/07/2014 17:25   I have personally reviewed and evaluated these images and lab results as part of my medical decision-making.   EKG Interpretation None      MDM   Final diagnoses:  Right leg pain    81 year old male with a right testes is secondary to her previous intertrochanteric fracture had a witnessed mechanical fall today landing on his right side severe pain since that time where he can't stand. On exam he has significant hip and upper right leg pain with any range of motion of his hip but no pain with axial loading. No obvious tendon defects. X-rays were negative, discussed the case with radiology to see if an MRI would be appropriate and this is CT would be better and have less scatter so CT scan was done which did not show any periprosthetic fractures. Gave 4 separate pain medications and the pain is improved when he is relaxed, but he cannot still bear weight. Discussed medicine about admission and orthopedics to see him in the AM.    Marily Memos, MD 11/08/14 0120

## 2014-11-07 NOTE — ED Notes (Signed)
Rt leg appears to be externally rotated and shortened.

## 2014-11-07 NOTE — ED Notes (Signed)
Pt with right hip pain after fall today per family; pt with hx of hip replacement

## 2014-11-08 ENCOUNTER — Observation Stay (HOSPITAL_COMMUNITY): Payer: Medicare Other

## 2014-11-08 DIAGNOSIS — R338 Other retention of urine: Secondary | ICD-10-CM

## 2014-11-08 DIAGNOSIS — W19XXXA Unspecified fall, initial encounter: Secondary | ICD-10-CM | POA: Insufficient documentation

## 2014-11-08 DIAGNOSIS — R0902 Hypoxemia: Secondary | ICD-10-CM | POA: Diagnosis not present

## 2014-11-08 DIAGNOSIS — J9621 Acute and chronic respiratory failure with hypoxia: Secondary | ICD-10-CM

## 2014-11-08 DIAGNOSIS — M25562 Pain in left knee: Secondary | ICD-10-CM | POA: Diagnosis not present

## 2014-11-08 DIAGNOSIS — W19XXXD Unspecified fall, subsequent encounter: Secondary | ICD-10-CM | POA: Diagnosis not present

## 2014-11-08 DIAGNOSIS — R2689 Other abnormalities of gait and mobility: Secondary | ICD-10-CM | POA: Diagnosis not present

## 2014-11-08 DIAGNOSIS — M25551 Pain in right hip: Secondary | ICD-10-CM | POA: Diagnosis present

## 2014-11-08 LAB — URINE MICROSCOPIC-ADD ON

## 2014-11-08 LAB — URINALYSIS, ROUTINE W REFLEX MICROSCOPIC
Glucose, UA: NEGATIVE mg/dL
KETONES UR: NEGATIVE mg/dL
NITRITE: NEGATIVE
Protein, ur: 100 mg/dL — AB
Specific Gravity, Urine: 1.026 (ref 1.005–1.030)
UROBILINOGEN UA: 1 mg/dL (ref 0.0–1.0)
pH: 5 (ref 5.0–8.0)

## 2014-11-08 LAB — LIPASE, BLOOD: LIPASE: 17 U/L — AB (ref 22–51)

## 2014-11-08 MED ORDER — HEPARIN SODIUM (PORCINE) 5000 UNIT/ML IJ SOLN
5000.0000 [IU] | Freq: Three times a day (TID) | INTRAMUSCULAR | Status: DC
  Administered 2014-11-08 – 2014-11-10 (×6): 5000 [IU] via SUBCUTANEOUS
  Filled 2014-11-08 (×6): qty 1

## 2014-11-08 MED ORDER — TAMSULOSIN HCL 0.4 MG PO CAPS
0.4000 mg | ORAL_CAPSULE | Freq: Every day | ORAL | Status: DC
  Administered 2014-11-08 – 2014-11-12 (×3): 0.4 mg via ORAL
  Filled 2014-11-08 (×5): qty 1

## 2014-11-08 MED ORDER — HYDROMORPHONE HCL 1 MG/ML IJ SOLN
0.5000 mg | INTRAMUSCULAR | Status: DC | PRN
  Administered 2014-11-08 (×2): 0.5 mg via INTRAVENOUS
  Filled 2014-11-08 (×2): qty 1

## 2014-11-08 MED ORDER — KETOROLAC TROMETHAMINE 30 MG/ML IJ SOLN
15.0000 mg | Freq: Once | INTRAMUSCULAR | Status: AC
  Administered 2014-11-08: 15 mg via INTRAVENOUS
  Filled 2014-11-08: qty 1

## 2014-11-08 MED ORDER — ACETAMINOPHEN 500 MG PO TABS
1000.0000 mg | ORAL_TABLET | Freq: Three times a day (TID) | ORAL | Status: DC
  Administered 2014-11-08 (×2): 1000 mg via ORAL
  Filled 2014-11-08 (×2): qty 2

## 2014-11-08 MED ORDER — HYDROCODONE-ACETAMINOPHEN 5-325 MG PO TABS: 1.0000 | ORAL_TABLET | ORAL | Status: DC | PRN

## 2014-11-08 MED ORDER — HYDROMORPHONE HCL 1 MG/ML IJ SOLN
1.0000 mg | Freq: Once | INTRAMUSCULAR | Status: AC
  Administered 2014-11-08: 1 mg via INTRAVENOUS
  Filled 2014-11-08: qty 1

## 2014-11-08 MED ORDER — MELOXICAM 7.5 MG PO TABS
7.5000 mg | ORAL_TABLET | Freq: Every day | ORAL | Status: DC
  Administered 2014-11-08: 7.5 mg via ORAL
  Filled 2014-11-08 (×2): qty 1

## 2014-11-08 MED ORDER — PNEUMOCOCCAL VAC POLYVALENT 25 MCG/0.5ML IJ INJ: 0.5000 mL | INJECTION | INTRAMUSCULAR | Status: DC

## 2014-11-08 MED ORDER — ONDANSETRON HCL 4 MG/2ML IJ SOLN
4.0000 mg | Freq: Three times a day (TID) | INTRAMUSCULAR | Status: DC | PRN
  Administered 2014-11-08: 4 mg via INTRAVENOUS
  Filled 2014-11-08: qty 2

## 2014-11-08 MED ORDER — OXYCODONE HCL 5 MG PO TABS
10.0000 mg | ORAL_TABLET | ORAL | Status: DC | PRN
  Administered 2014-11-08 – 2014-11-09 (×4): 10 mg via ORAL
  Filled 2014-11-08 (×4): qty 2

## 2014-11-08 NOTE — ED Notes (Signed)
Wallet given to son to take home.

## 2014-11-08 NOTE — H&P (Signed)
[facility] History and Physical  Neil Terry WUJ:[id number] DOB: [birth year] DOA: 11/07/2014  Referring physician: EDP PCP: No primary care provider on file.   Chief Complaint: Fall, Right hip pain   HPI: Neil Terry is a 81 y.o. male with history of orthopaedic procedure to his R femur.  Patient suffered a mechanical fall today at home and landed on his R hip.  Since the fall he has been having severe R hip pain, worse with attempt to move the right hip, nothing makes symptoms better.  He is unable to bear weight on the R hip.  R leg appears to be externally rotated and shortened.  Surprisingly on X ray there was no hip fracture.  His pain persisted so a CT scan was performed which also showed no fracture.  He is being admitted due to inability to walk.  Review of Systems: Systems reviewed.  As above, otherwise negative  History reviewed. No pertinent past medical history. Past Surgical History  Procedure Laterality Date  . Joint replacement     Social History:  reports that he has never smoked. He does not have any smokeless tobacco history on file. He reports that he does not drink alcohol or use illicit drugs.  No Known Allergies  History reviewed. No pertinent family history.   Prior to Admission medications   Medication Sig Start Date End Date Taking? Authorizing Provider  tobramycin-dexamethasone [facility]) ophthalmic solution Place 1 drop into both eyes every 4 (four) hours while awake.   Yes Historical Provider, MD   Physical Exam: Filed Vitals:   11/08/14 0130  BP: 112/69  Pulse: 75  Temp:   Resp:     BP 112/69 mmHg  Pulse 75  Temp(Src) 97.7 F (36.5 C) (Oral)  Resp 18  SpO2 88%  General Appearance:    Alert, oriented, no distress, appears stated age  Head:    Normocephalic, atraumatic  Eyes:    PERRL, EOMI, sclera non-icteric        Nose:   Nares without drainage or epistaxis. Mucosa, turbinates normal  Throat:   Moist mucous membranes.  Oropharynx without erythema or exudate.  Neck:   Supple. No carotid bruits.  No thyromegaly.  No lymphadenopathy.   Back:     No CVA tenderness, no spinal tenderness  Lungs:     Clear to auscultation bilaterally, without wheezes, rhonchi or rales  Chest wall:    No tenderness to palpitation  Heart:    Regular rate and rhythm without murmurs, gallops, rubs  Abdomen:     Soft, non-tender, nondistended, normal bowel sounds, no organomegaly  Genitalia:    deferred  Rectal:    deferred  Extremities:   Severe pain with attempted motion of the R hip.  Pulses:   2+ and symmetric all extremities  Skin:   Skin color, texture, turgor normal, no rashes or lesions  Lymph nodes:   Cervical, supraclavicular, and axillary nodes normal  Neurologic:   CNII-XII intact. Normal strength, sensation and reflexes      throughout    Labs on Admission:  Basic Metabolic Panel:  Recent Labs Lab 11/07/14 1945  NA 135  K 3.8  CL 98*  CO2 25  GLUCOSE 85  BUN 13  CREATININE 0.95  CALCIUM 9.1   Liver Function Tests:  Recent Labs Lab 11/07/14 1945  AST 26  ALT 14*  ALKPHOS 89  BILITOT 0.9  PROT 8.2*  ALBUMIN 4.1   No results for input(s): LIPASE, AMYLASE in the last  168 hours. No results for input(s): AMMONIA in the last 168 hours. CBC:  Recent Labs Lab 11/07/14 1945  WBC 15.1*  NEUTROABS 13.1*  HGB 16.1  HCT 49.1  MCV 89.6  PLT 225   Cardiac Enzymes: No results for input(s): CKTOTAL, CKMB, CKMBINDEX, TROPONINI in the last 168 hours.  BNP (last 3 results) No results for input(s): PROBNP in the last 8760 hours. CBG: No results for input(s): GLUCAP in the last 168 hours.  Radiological Exams on Admission: Ct Pelvis Wo Contrast  11/07/2014   CLINICAL DATA:  Right hip pain after a fall today.  EXAM: CT PELVIS AND RIGHT LOWER EXTREMITY WITHOUT CONTRAST  TECHNIQUE: Multidetector CT imaging of the pelvis and right lower extremity was performed according to the standard protocol without  intravenous contrast. Multiplanar CT image reconstructions were also generated.  COMPARISON:  Pelvis and right hip radiographs 11/07/2014  FINDINGS: CT PELVIS FINDINGS  Diffuse bone demineralization. The sacrum and SI joints appear intact and symmetrical. Pelvis appears intact. No acute displaced fractures identified. Symphysis pubis is not displaced. No focal bone lesion or bone destruction. Incidental note of cement at L3 consistent with prior kyphoplasty. Compression of the superior and inferior endplates at L4. Changes are likely due to osteoporosis.  Soft tissue pelvis demonstrates vascular calcification in the abdominal aorta. There are multiple ventral abdominal wall hernias containing colon and small bowel. No visualized bowel obstruction. Bladder wall is mildly thickened which may be due to cystitis or hypertrophy from outlet obstruction. Prostate gland is enlarged and prostate calcifications are present.  CT RIGHT LOWER EXTREMITY FINDINGS  Diffuse bone demineralization. Previous intra medullary rod fixation of the femur with distal locking screw. No evidence of displacement of hardware components. Old healed fracture deformity of the inter trochanteric region. No evidence of acute fracture or dislocation of the right femur. No focal bone lesion or bone destruction. No significant effusion at the knee. Soft tissues of the right leg demonstrate diffuse muscular fatty atrophy. Scattered vascular calcifications in the superficial femoral artery. No focal fluid collection or hematoma.  IMPRESSION: Diffuse bone demineralization. Lower lumbar compression fractures consistent with osteoporosis. No acute fractures demonstrated in the pelvis or right femur. Previous intra medullary rod fixation of the right femur with and old healed fracture of the inter trochanteric region. Diffuse fatty atrophy of the right leg muscles.   Electronically Signed   By: Burman Nieves M.D.   On: 11/07/2014 22:16   Ct Femur Right  Wo Contrast  11/07/2014   CLINICAL DATA:  Right hip pain after a fall today.  EXAM: CT PELVIS AND RIGHT LOWER EXTREMITY WITHOUT CONTRAST  TECHNIQUE: Multidetector CT imaging of the pelvis and right lower extremity was performed according to the standard protocol without intravenous contrast. Multiplanar CT image reconstructions were also generated.  COMPARISON:  Pelvis and right hip radiographs 11/07/2014  FINDINGS: CT PELVIS FINDINGS  Diffuse bone demineralization. The sacrum and SI joints appear intact and symmetrical. Pelvis appears intact. No acute displaced fractures identified. Symphysis pubis is not displaced. No focal bone lesion or bone destruction. Incidental note of cement at L3 consistent with prior kyphoplasty. Compression of the superior and inferior endplates at L4. Changes are likely due to osteoporosis.  Soft tissue pelvis demonstrates vascular calcification in the abdominal aorta. There are multiple ventral abdominal wall hernias containing colon and small bowel. No visualized bowel obstruction. Bladder wall is mildly thickened which may be due to cystitis or hypertrophy from outlet obstruction. Prostate gland is enlarged and  prostate calcifications are present.  CT RIGHT LOWER EXTREMITY FINDINGS  Diffuse bone demineralization. Previous intra medullary rod fixation of the femur with distal locking screw. No evidence of displacement of hardware components. Old healed fracture deformity of the inter trochanteric region. No evidence of acute fracture or dislocation of the right femur. No focal bone lesion or bone destruction. No significant effusion at the knee. Soft tissues of the right leg demonstrate diffuse muscular fatty atrophy. Scattered vascular calcifications in the superficial femoral artery. No focal fluid collection or hematoma.  IMPRESSION: Diffuse bone demineralization. Lower lumbar compression fractures consistent with osteoporosis. No acute fractures demonstrated in the pelvis or right  femur. Previous intra medullary rod fixation of the right femur with and old healed fracture of the inter trochanteric region. Diffuse fatty atrophy of the right leg muscles.   Electronically Signed   By: Burman Nieves M.D.   On: 11/07/2014 22:16   Dg Hip Unilat With Pelvis 2-3 Views Right  11/07/2014   CLINICAL DATA:  Status post fall today with right-sided hip pain, history of hip fracture 6 years ago.  EXAM: DG HIP (WITH OR WITHOUT PELVIS) 2-3V RIGHT  COMPARISON:  None.  FINDINGS: The bones are osteopenic. There is old deformity of the intertrochanteric region of the right hip. The femoral head is intact. The metallic rod is in reasonable position. The observed portions of the right hemipelvis exhibit no acute fracture. There is chronic deformity of the superior pubic ramus on the left.  IMPRESSION: There is no acute bony abnormality of the right hip or the visualized portions of the right hemipelvis.   Electronically Signed   By: David  [place] M.D.   On: 11/07/2014 17:25    EKG: Independently reviewed.  Assessment/Plan Principal Problem:   Right hip pain   1. R hip pain - 1. No evidence of fracture on X ray nor CT scan 2. Will do pain control with dilaudid at this point and admit 3. Orthopaedic surgery to see patient in AM for further evaluation.  Orthopaedics consulted and will see patient in AM.  Code Status: Full  Family Communication: Family at bedside Disposition Plan: Admit to obs   Time spent: 50 min  GARDNER, JARED M. [facility] Pager [phone]  If 7AM-7PM, please contact the day team taking care of the patient [email or web address] Password [email or web address] 11/08/2014, 1:40 AM

## 2014-11-08 NOTE — Consult Note (Signed)
No acute signs of fracture noted.  Will transition to weight bearing as tolerated and see how he mobilizes with PT.  No plans for surgical intervention at this time.  ORTHOPAEDIC CONSULTATION  REQUESTING PHYSICIAN: Etta Quill, DO  Chief Complaint: R hip pain HPI: Neil Terry is a 81 y.o. male who complains of R hip pain and inability to ambulate after a mechanical fall at home.  Per the patient's report he landed on the R hip.  Prior to that he was ambulatory without assistance at home and a walker in the community.  He has a hx of a [place] of the R [place]  He reports that he had no pain or difficulty with the leg until he fell.  He denies numbness or tingling that radiate into the foot.   History reviewed. No pertinent past medical history. Past Surgical History  Procedure Laterality Date  . Joint replacement     Social History   Social History  . Marital Status: Married    Spouse Name: N/A  . Number of Children: N/A  . Years of Education: N/A   Social History Main Topics  . Smoking status: Never Smoker   . Smokeless tobacco: None  . Alcohol Use: No  . Drug Use: No  . Sexual Activity: Not Asked   Other Topics Concern  . None   Social History Narrative   History reviewed. No pertinent family history. No Known Allergies Prior to Admission medications   Medication Sig Start Date End Date Taking? Authorizing Provider  tobramycin-dexamethasone [facility]) ophthalmic solution Place 1 drop into both eyes every 4 (four) hours while awake.   Yes Historical Provider, MD   Ct Pelvis Wo Contrast  11/07/2014   CLINICAL DATA:  Right hip pain after a fall today.  EXAM: CT PELVIS AND RIGHT LOWER EXTREMITY WITHOUT CONTRAST  TECHNIQUE: Multidetector CT imaging of the pelvis and right lower extremity was performed according to the standard protocol without intravenous contrast. Multiplanar CT image reconstructions were also generated.  COMPARISON:   Pelvis and right hip radiographs 11/07/2014  FINDINGS: CT PELVIS FINDINGS  Diffuse bone demineralization. The sacrum and SI joints appear intact and symmetrical. Pelvis appears intact. No acute displaced fractures identified. Symphysis pubis is not displaced. No focal bone lesion or bone destruction. Incidental note of cement at L3 consistent with prior kyphoplasty. Compression of the superior and inferior endplates at L4. Changes are likely due to osteoporosis.  Soft tissue pelvis demonstrates vascular calcification in the abdominal aorta. There are multiple ventral abdominal wall hernias containing colon and small bowel. No visualized bowel obstruction. Bladder wall is mildly thickened which may be due to cystitis or hypertrophy from outlet obstruction. Prostate gland is enlarged and prostate calcifications are present.  CT RIGHT LOWER EXTREMITY FINDINGS  Diffuse bone demineralization. Previous intra medullary rod fixation of the femur with distal locking screw. No evidence of displacement of hardware components. Old healed fracture deformity of the inter trochanteric region. No evidence of acute fracture or dislocation of the right femur. No focal bone lesion or bone destruction. No significant effusion at the knee. Soft tissues of the right leg demonstrate diffuse muscular fatty atrophy. Scattered vascular calcifications in the superficial femoral artery. No focal fluid collection or hematoma.  IMPRESSION: Diffuse bone demineralization. Lower lumbar compression fractures consistent with osteoporosis. No acute fractures demonstrated in the pelvis or right femur. Previous intra medullary rod fixation of the right femur with and old healed fracture  of the inter trochanteric region. Diffuse fatty atrophy of the right leg muscles.   Electronically Signed   By: Lucienne Capers M.D.   On: 11/07/2014 22:16   Ct Femur Right Wo Contrast  11/07/2014   CLINICAL DATA:  Right hip pain after a fall today.  EXAM: CT PELVIS  AND RIGHT LOWER EXTREMITY WITHOUT CONTRAST  TECHNIQUE: Multidetector CT imaging of the pelvis and right lower extremity was performed according to the standard protocol without intravenous contrast. Multiplanar CT image reconstructions were also generated.  COMPARISON:  Pelvis and right hip radiographs 11/07/2014  FINDINGS: CT PELVIS FINDINGS  Diffuse bone demineralization. The sacrum and SI joints appear intact and symmetrical. Pelvis appears intact. No acute displaced fractures identified. Symphysis pubis is not displaced. No focal bone lesion or bone destruction. Incidental note of cement at L3 consistent with prior kyphoplasty. Compression of the superior and inferior endplates at L4. Changes are likely due to osteoporosis.  Soft tissue pelvis demonstrates vascular calcification in the abdominal aorta. There are multiple ventral abdominal wall hernias containing colon and small bowel. No visualized bowel obstruction. Bladder wall is mildly thickened which may be due to cystitis or hypertrophy from outlet obstruction. Prostate gland is enlarged and prostate calcifications are present.  CT RIGHT LOWER EXTREMITY FINDINGS  Diffuse bone demineralization. Previous intra medullary rod fixation of the femur with distal locking screw. No evidence of displacement of hardware components. Old healed fracture deformity of the inter trochanteric region. No evidence of acute fracture or dislocation of the right femur. No focal bone lesion or bone destruction. No significant effusion at the knee. Soft tissues of the right leg demonstrate diffuse muscular fatty atrophy. Scattered vascular calcifications in the superficial femoral artery. No focal fluid collection or hematoma.  IMPRESSION: Diffuse bone demineralization. Lower lumbar compression fractures consistent with osteoporosis. No acute fractures demonstrated in the pelvis or right femur. Previous intra medullary rod fixation of the right femur with and old healed fracture  of the inter trochanteric region. Diffuse fatty atrophy of the right leg muscles.   Electronically Signed   By: Lucienne Capers M.D.   On: 11/07/2014 22:16   Dg Hip Unilat With Pelvis 2-3 Views Right  11/07/2014   CLINICAL DATA:  Status post fall today with right-sided hip pain, history of hip fracture 6 years ago.  EXAM: DG HIP (WITH OR WITHOUT PELVIS) 2-3V RIGHT  COMPARISON:  None.  FINDINGS: The bones are osteopenic. There is old deformity of the intertrochanteric region of the right hip. The femoral head is intact. The metallic rod is in reasonable position. The observed portions of the right hemipelvis exhibit no acute fracture. There is chronic deformity of the superior pubic ramus on the left.  IMPRESSION: There is no acute bony abnormality of the right hip or the visualized portions of the right hemipelvis.   Electronically Signed   By: David  Martinique M.D.   On: 11/07/2014 17:25   Dg Femur, Min 2 Views Right  11/08/2014   CLINICAL DATA:  81 year old male unable to bear weight on the right leg.  EXAM: RIGHT FEMUR 2 VIEWS  COMPARISON:  CT of the right lower extremity dated 11/07/2014  FINDINGS: An intra medullary rod is noted in the right femur. There is old healed fracture of the right intertrochanteric region. The bones are osteopenic. No acute fracture identified.  IMPRESSION: No acute fracture or dislocation. Right femoral intra medullary rod.   Electronically Signed   By: Anner Crete M.D.   On:  11/08/2014 02:01    Positive ROS: All other systems have been reviewed and were otherwise negative with the exception of those mentioned in the HPI and as above.  Labs cbc  Recent Labs  11/07/14 1945  WBC 15.1*  HGB 16.1  HCT 49.1  PLT 225    Labs inflam No results for input(s): CRP in the last 72 hours.  Invalid input(s): ESR  Labs coag No results for input(s): INR, PTT in the last 72 hours.  Invalid input(s): PT   Recent Labs  11/07/14 1945  NA 135  K 3.8  CL 98*  CO2 25    GLUCOSE 85  BUN 13  CREATININE 0.95  CALCIUM 9.1    Physical Exam: Filed Vitals:   11/08/14 0233  BP: 158/96  Pulse: 87  Temp: 97.9 F (36.6 C)  Resp: 17   General: Alert, no acute distress Cardiovascular: No pedal edema Respiratory: No cyanosis, no use of accessory musculature GI: No organomegaly, abdomen is soft and non-tender Skin: No lesions in the area of chief complaint other than those listed below in MSK exam.  Neurologic: Sensation intact distally Psychiatric: Patient is competent for consent with normal mood and affect Lymphatic: No axillary or cervical lymphadenopathy  MUSCULOSKELETAL:  Right hip has no swelling or erythema. Leg appears to be shortened and externally rotated.  Mild tenderness to palpation over the greater troch region.  Very limited ROM of the hip due to pain.  Pain with log roll.  Sensation intact with 2+ distal pulses.  Other extremities are atraumatic with painless ROM and NVI.  Assessment: Right hip pain with a history of a retrograde nail to the R femur 6 years ago  Plan: CT and xray are negative for hip fracture despite the clinical picture of pain with ROM and WB.  Patient reports that he was have no difficulties with the R leg until the fall.  Will remain on bedrest until cause of pain can be determined.   Gae Dry, PA-C Cell [phone]   11/08/2014 7:32 AM

## 2014-11-08 NOTE — Progress Notes (Signed)
[facility] PROGRESS NOTE  Neil Terry WUJ:[id number] DOB: 02/17/1928 DOA: 11/07/2014 PCP: No primary care provider on file.  Brief Summary  Neil Terry is a 81 y.o. male with history of orthopaedic procedure to his R femur. Patient suffered a mechanical fall and landed on his R hip resulting in severe R hip pain, worse with attempt to move the right hip. He was unable to bear weight on the R hip. Surprisingly on X ray there was no hip fracture. His pain persisted so a CT scan was performed which also showed no fracture. He was admitted due to inability to walk.  Assessment/Plan  Right hip pain without obvious fracture on x-ray or CT scan.  Despite limited range of motion of the right hip and leukocytosis, I doubt that he has septic arthritis given the tenderness to palpation over the left lateral hip, and pain with attempts at flexion of the knee.  -  Physical therapy evaluation -  Appreciate orthopedics assistance, awaiting official recommendations -  Start tylenol, mobic -  Consider addition of gabapentin -  Continue pain control with emphasis on oral pain medication  Acute urinary retention likely secondary to narcotics -  Post void residual was greater than 500 mL -  Foley catheter placement -  Minimize narcotics if possible -  Start Flomax  Intermittent hypoxia with evidence of bilateral lower lobe scarring on chest x-ray -  IS -  OOB to chair as tolerated  Leukocytosis, no evidence of pneumonia on chest x-ray -  Check urine, urinalysis pending  Abdominal pain, likely related to urinary retention -  Foley catheter placed -  Liver function tests within normal limits, lipase added, also within normal limits -  Urinalysis pending  Diet:  Regular Access:  PIV IVF:  off Proph:  heparin  Code Status: full Family Communication: patient and his niece Disposition Plan: pending PT evaluation and able to tolerate oral pain medication. Likely will be unable to  transition directly to skilled nursing facility secondary to observation status, no definite fracture.   Consultants:   Orthopedics  Procedures:   Hip x-ray  CT femur right side  CT pelvis  X-ray femur right  Chest x-ray  Antibiotics:   None   HPI/Subjective:  States that he is continuing to have severe pain in the right lower extremity from the lateral right hip down to the knee on the lateral side. He denies swelling, calf pain, redness. He has also noticed some abdominal pain which happens intermittently at home. Pain is located in the epigastrium and slightly to the left and lasts for approximately 6 hours when it comes on. Nothing obviously triggers his pain or relieves it.    Objective: Filed Vitals:   11/08/14 0130 11/08/14 0233 11/08/14 0929 11/08/14 1306  BP: 112/69 158/96    Pulse: 75 87    Temp:  97.9 F (36.6 C) 98.1 F (36.7 C) 98.1 F (36.7 C)  TempSrc:  Oral Oral Oral  Resp:  17    SpO2: 88% 92%     No intake or output data in the 24 hours ending 11/08/14 1355 There were no vitals filed for this visit. There is no height or weight on file to calculate BMI.  Exam:   General:  Adult male, No acute distress  HEENT:  NCAT, MMM  Cardiovascular:  RRR, nl S1, S2 no mrg, 2+ pulses, warm extremities  Respiratory:  CTAB, no increased WOB  Abdomen:   NABS, soft, ventral hernias which are reducible,  tender to palpation in the epigastrium and slightly to the left without rebound or guarding. Also has some mild tenderness with a palpable bladder and the suprapubic area   MSK:   Normal tone and bulk, no LEE. Limited range of motion of the right hip both flexion and extension and also the right knee flexion secondary to pain. No obvious swelling, joint effusion, calf tenderness, cords. He is tender to palpation over the left lateral hip/greater trochanter.    Neuro:  Grossly intact  Data Reviewed: Basic Metabolic Panel:  Recent Labs Lab 11/07/14 1945   NA 135  K 3.8  CL 98*  CO2 25  GLUCOSE 85  BUN 13  CREATININE 0.95  CALCIUM 9.1   Liver Function Tests:  Recent Labs Lab 11/07/14 1945  AST 26  ALT 14*  ALKPHOS 89  BILITOT 0.9  PROT 8.2*  ALBUMIN 4.1    Recent Labs Lab 11/08/14 1110  LIPASE 17*   No results for input(s): AMMONIA in the last 168 hours. CBC:  Recent Labs Lab 11/07/14 1945  WBC 15.1*  NEUTROABS 13.1*  HGB 16.1  HCT 49.1  MCV 89.6  PLT 225    No results found for this or any previous visit (from the past 240 hour(s)).   Studies: Ct Pelvis Wo Contrast  11/07/2014   CLINICAL DATA:  Right hip pain after a fall today.  EXAM: CT PELVIS AND RIGHT LOWER EXTREMITY WITHOUT CONTRAST  TECHNIQUE: Multidetector CT imaging of the pelvis and right lower extremity was performed according to the standard protocol without intravenous contrast. Multiplanar CT image reconstructions were also generated.  COMPARISON:  Pelvis and right hip radiographs 11/07/2014  FINDINGS: CT PELVIS FINDINGS  Diffuse bone demineralization. The sacrum and SI joints appear intact and symmetrical. Pelvis appears intact. No acute displaced fractures identified. Symphysis pubis is not displaced. No focal bone lesion or bone destruction. Incidental note of cement at L3 consistent with prior kyphoplasty. Compression of the superior and inferior endplates at L4. Changes are likely due to osteoporosis.  Soft tissue pelvis demonstrates vascular calcification in the abdominal aorta. There are multiple ventral abdominal wall hernias containing colon and small bowel. No visualized bowel obstruction. Bladder wall is mildly thickened which may be due to cystitis or hypertrophy from outlet obstruction. Prostate gland is enlarged and prostate calcifications are present.  CT RIGHT LOWER EXTREMITY FINDINGS  Diffuse bone demineralization. Previous intra medullary rod fixation of the femur with distal locking screw. No evidence of displacement of hardware components.  Old healed fracture deformity of the inter trochanteric region. No evidence of acute fracture or dislocation of the right femur. No focal bone lesion or bone destruction. No significant effusion at the knee. Soft tissues of the right leg demonstrate diffuse muscular fatty atrophy. Scattered vascular calcifications in the superficial femoral artery. No focal fluid collection or hematoma.  IMPRESSION: Diffuse bone demineralization. Lower lumbar compression fractures consistent with osteoporosis. No acute fractures demonstrated in the pelvis or right femur. Previous intra medullary rod fixation of the right femur with and old healed fracture of the inter trochanteric region. Diffuse fatty atrophy of the right leg muscles.   Electronically Signed   By: Burman Nieves M.D.   On: 11/07/2014 22:16   Ct Femur Right Wo Contrast  11/07/2014   CLINICAL DATA:  Right hip pain after a fall today.  EXAM: CT PELVIS AND RIGHT LOWER EXTREMITY WITHOUT CONTRAST  TECHNIQUE: Multidetector CT imaging of the pelvis and right lower extremity was performed according  to the standard protocol without intravenous contrast. Multiplanar CT image reconstructions were also generated.  COMPARISON:  Pelvis and right hip radiographs 11/07/2014  FINDINGS: CT PELVIS FINDINGS  Diffuse bone demineralization. The sacrum and SI joints appear intact and symmetrical. Pelvis appears intact. No acute displaced fractures identified. Symphysis pubis is not displaced. No focal bone lesion or bone destruction. Incidental note of cement at L3 consistent with prior kyphoplasty. Compression of the superior and inferior endplates at L4. Changes are likely due to osteoporosis.  Soft tissue pelvis demonstrates vascular calcification in the abdominal aorta. There are multiple ventral abdominal wall hernias containing colon and small bowel. No visualized bowel obstruction. Bladder wall is mildly thickened which may be due to cystitis or hypertrophy from outlet  obstruction. Prostate gland is enlarged and prostate calcifications are present.  CT RIGHT LOWER EXTREMITY FINDINGS  Diffuse bone demineralization. Previous intra medullary rod fixation of the femur with distal locking screw. No evidence of displacement of hardware components. Old healed fracture deformity of the inter trochanteric region. No evidence of acute fracture or dislocation of the right femur. No focal bone lesion or bone destruction. No significant effusion at the knee. Soft tissues of the right leg demonstrate diffuse muscular fatty atrophy. Scattered vascular calcifications in the superficial femoral artery. No focal fluid collection or hematoma.  IMPRESSION: Diffuse bone demineralization. Lower lumbar compression fractures consistent with osteoporosis. No acute fractures demonstrated in the pelvis or right femur. Previous intra medullary rod fixation of the right femur with and old healed fracture of the inter trochanteric region. Diffuse fatty atrophy of the right leg muscles.   Electronically Signed   By: Burman Nieves M.D.   On: 11/07/2014 22:16   Dg Chest Port 1 View  11/08/2014   CLINICAL DATA:  Patient with hypoxia.  EXAM: PORTABLE CHEST - 1 VIEW  COMPARISON:  Chest radiograph 09/17/2005  FINDINGS: Monitoring leads overlie the patient. Stable enlarged cardiac and mediastinal contours. Tortuosity of the thoracic aorta. Elevation right hemidiaphragm. No consolidative pulmonary opacities. Chronic bilateral predominantly lower lung interstitial opacities, likely represent scarring. Abnormal appearance to the proximal humerus/humeral head, incompletely visualized.  IMPRESSION: Abnormal appearance to the proximal humerus/humeral which is incompletely visualized. Recommend dedicated views as this may potentially represent degenerative changes, abnormal appearance from positioning, old fracture or acute injury.  No acute cardiopulmonary process.   Electronically Signed   By: Annia Belt M.D.   On:  11/08/2014 11:30   Dg Hip Unilat With Pelvis 2-3 Views Right  11/07/2014   CLINICAL DATA:  Status post fall today with right-sided hip pain, history of hip fracture 6 years ago.  EXAM: DG HIP (WITH OR WITHOUT PELVIS) 2-3V RIGHT  COMPARISON:  None.  FINDINGS: The bones are osteopenic. There is old deformity of the intertrochanteric region of the right hip. The femoral head is intact. The metallic rod is in reasonable position. The observed portions of the right hemipelvis exhibit no acute fracture. There is chronic deformity of the superior pubic ramus on the left.  IMPRESSION: There is no acute bony abnormality of the right hip or the visualized portions of the right hemipelvis.   Electronically Signed   By: David  [place] M.D.   On: 11/07/2014 17:25   Dg Femur, Min 2 Views Right  11/08/2014   CLINICAL DATA:  81 year old male unable to bear weight on the right leg.  EXAM: RIGHT FEMUR 2 VIEWS  COMPARISON:  CT of the right lower extremity dated 11/07/2014  FINDINGS: An intra medullary  rod is noted in the right femur. There is old healed fracture of the right intertrochanteric region. The bones are osteopenic. No acute fracture identified.  IMPRESSION: No acute fracture or dislocation. Right femoral intra medullary rod.   Electronically Signed   By: Elgie Collard M.D.   On: 11/08/2014 02:01    Scheduled Meds: . heparin  5,000 Units Subcutaneous 3 times per day  . tamsulosin  0.4 mg Oral Daily   Continuous Infusions:   Principal Problem:   Right hip pain    Time spent: 30 min    Dublin Grayer, [facility]  [facility] Pager [phone]. If 7PM-7AM, please contact night-coverage at [email or web address], password [email or web address] 11/08/2014, 1:55 PM

## 2014-11-09 ENCOUNTER — Encounter (HOSPITAL_COMMUNITY): Payer: Self-pay

## 2014-11-09 ENCOUNTER — Inpatient Hospital Stay (HOSPITAL_COMMUNITY): Payer: Medicare Other

## 2014-11-09 DIAGNOSIS — R112 Nausea with vomiting, unspecified: Secondary | ICD-10-CM

## 2014-11-09 DIAGNOSIS — J189 Pneumonia, unspecified organism: Secondary | ICD-10-CM

## 2014-11-09 DIAGNOSIS — I248 Other forms of acute ischemic heart disease: Secondary | ICD-10-CM | POA: Diagnosis present

## 2014-11-09 DIAGNOSIS — N400 Enlarged prostate without lower urinary tract symptoms: Secondary | ICD-10-CM | POA: Diagnosis present

## 2014-11-09 DIAGNOSIS — J441 Chronic obstructive pulmonary disease with (acute) exacerbation: Secondary | ICD-10-CM | POA: Diagnosis present

## 2014-11-09 DIAGNOSIS — J9601 Acute respiratory failure with hypoxia: Secondary | ICD-10-CM | POA: Diagnosis not present

## 2014-11-09 DIAGNOSIS — I480 Paroxysmal atrial fibrillation: Secondary | ICD-10-CM | POA: Diagnosis not present

## 2014-11-09 DIAGNOSIS — I714 Abdominal aortic aneurysm, without rupture: Secondary | ICD-10-CM | POA: Diagnosis not present

## 2014-11-09 DIAGNOSIS — R1013 Epigastric pain: Secondary | ICD-10-CM

## 2014-11-09 DIAGNOSIS — I959 Hypotension, unspecified: Secondary | ICD-10-CM | POA: Diagnosis not present

## 2014-11-09 DIAGNOSIS — I4891 Unspecified atrial fibrillation: Secondary | ICD-10-CM | POA: Diagnosis not present

## 2014-11-09 DIAGNOSIS — R338 Other retention of urine: Secondary | ICD-10-CM

## 2014-11-09 DIAGNOSIS — I472 Ventricular tachycardia: Secondary | ICD-10-CM | POA: Diagnosis not present

## 2014-11-09 DIAGNOSIS — K219 Gastro-esophageal reflux disease without esophagitis: Secondary | ICD-10-CM | POA: Diagnosis present

## 2014-11-09 DIAGNOSIS — T40605A Adverse effect of unspecified narcotics, initial encounter: Secondary | ICD-10-CM | POA: Diagnosis present

## 2014-11-09 DIAGNOSIS — R262 Difficulty in walking, not elsewhere classified: Secondary | ICD-10-CM | POA: Diagnosis present

## 2014-11-09 DIAGNOSIS — K8020 Calculus of gallbladder without cholecystitis without obstruction: Secondary | ICD-10-CM | POA: Diagnosis not present

## 2014-11-09 DIAGNOSIS — E860 Dehydration: Secondary | ICD-10-CM | POA: Diagnosis not present

## 2014-11-09 DIAGNOSIS — I509 Heart failure, unspecified: Secondary | ICD-10-CM | POA: Diagnosis not present

## 2014-11-09 DIAGNOSIS — E871 Hypo-osmolality and hyponatremia: Secondary | ICD-10-CM | POA: Diagnosis not present

## 2014-11-09 DIAGNOSIS — R079 Chest pain, unspecified: Secondary | ICD-10-CM | POA: Diagnosis not present

## 2014-11-09 DIAGNOSIS — R330 Drug induced retention of urine: Secondary | ICD-10-CM | POA: Diagnosis present

## 2014-11-09 DIAGNOSIS — M25551 Pain in right hip: Secondary | ICD-10-CM | POA: Diagnosis not present

## 2014-11-09 DIAGNOSIS — D72829 Elevated white blood cell count, unspecified: Secondary | ICD-10-CM | POA: Diagnosis present

## 2014-11-09 DIAGNOSIS — N281 Cyst of kidney, acquired: Secondary | ICD-10-CM | POA: Diagnosis present

## 2014-11-09 DIAGNOSIS — Z23 Encounter for immunization: Secondary | ICD-10-CM | POA: Diagnosis not present

## 2014-11-09 DIAGNOSIS — R072 Precordial pain: Secondary | ICD-10-CM | POA: Diagnosis not present

## 2014-11-09 DIAGNOSIS — R918 Other nonspecific abnormal finding of lung field: Secondary | ICD-10-CM | POA: Diagnosis not present

## 2014-11-09 DIAGNOSIS — R1011 Right upper quadrant pain: Secondary | ICD-10-CM | POA: Diagnosis not present

## 2014-11-09 DIAGNOSIS — W19XXXA Unspecified fall, initial encounter: Secondary | ICD-10-CM | POA: Diagnosis present

## 2014-11-09 DIAGNOSIS — I493 Ventricular premature depolarization: Secondary | ICD-10-CM | POA: Diagnosis not present

## 2014-11-09 DIAGNOSIS — Z87891 Personal history of nicotine dependence: Secondary | ICD-10-CM | POA: Diagnosis not present

## 2014-11-09 DIAGNOSIS — T380X5A Adverse effect of glucocorticoids and synthetic analogues, initial encounter: Secondary | ICD-10-CM | POA: Diagnosis present

## 2014-11-09 DIAGNOSIS — K7689 Other specified diseases of liver: Secondary | ICD-10-CM | POA: Diagnosis not present

## 2014-11-09 DIAGNOSIS — N1330 Unspecified hydronephrosis: Secondary | ICD-10-CM | POA: Diagnosis not present

## 2014-11-09 DIAGNOSIS — I10 Essential (primary) hypertension: Secondary | ICD-10-CM | POA: Diagnosis present

## 2014-11-09 LAB — URINE CULTURE: Culture: NO GROWTH

## 2014-11-09 LAB — CBC
HEMATOCRIT: 41.1 % (ref 39.0–52.0)
HEMOGLOBIN: 12.9 g/dL — AB (ref 13.0–17.0)
MCH: 28.7 pg (ref 26.0–34.0)
MCHC: 31.4 g/dL (ref 30.0–36.0)
MCV: 91.5 fL (ref 78.0–100.0)
Platelets: 176 10*3/uL (ref 150–400)
RBC: 4.49 MIL/uL (ref 4.22–5.81)
RDW: 13.8 % (ref 11.5–15.5)
WBC: 9.1 10*3/uL (ref 4.0–10.5)

## 2014-11-09 LAB — BLOOD GAS, ARTERIAL
ACID-BASE EXCESS: 3.8 mmol/L — AB (ref 0.0–2.0)
Bicarbonate: 29 mEq/L — ABNORMAL HIGH (ref 20.0–24.0)
DRAWN BY: 445891
O2 CONTENT: 3 L/min
O2 SAT: 91.1 %
PATIENT TEMPERATURE: 98.6
PCO2 ART: 54.5 mmHg — AB (ref 35.0–45.0)
TCO2: 30.7 mmol/L (ref 0–100)
pH, Arterial: 7.346 — ABNORMAL LOW (ref 7.350–7.450)
pO2, Arterial: 60.8 mmHg — ABNORMAL LOW (ref 80.0–100.0)

## 2014-11-09 LAB — BASIC METABOLIC PANEL
ANION GAP: 5 (ref 5–15)
BUN: 19 mg/dL (ref 6–20)
CO2: 32 mmol/L (ref 22–32)
Calcium: 8.2 mg/dL — ABNORMAL LOW (ref 8.9–10.3)
Chloride: 96 mmol/L — ABNORMAL LOW (ref 101–111)
Creatinine, Ser: 1.16 mg/dL (ref 0.61–1.24)
GFR, EST NON AFRICAN AMERICAN: 56 mL/min — AB (ref >60)
GLUCOSE: 95 mg/dL (ref 65–99)
POTASSIUM: 4.2 mmol/L (ref 3.5–5.1)
Sodium: 133 mmol/L — ABNORMAL LOW (ref 135–145)

## 2014-11-09 LAB — BRAIN NATRIURETIC PEPTIDE: B NATRIURETIC PEPTIDE 5: 172.8 pg/mL — AB (ref 0.0–100.0)

## 2014-11-09 MED ORDER — IPRATROPIUM-ALBUTEROL 0.5-2.5 (3) MG/3ML IN SOLN
3.0000 mL | Freq: Three times a day (TID) | RESPIRATORY_TRACT | Status: DC
  Administered 2014-11-09 – 2014-11-10 (×4): 3 mL via RESPIRATORY_TRACT
  Filled 2014-11-09 (×4): qty 3

## 2014-11-09 MED ORDER — DOXYCYCLINE HYCLATE 100 MG PO TABS: 100.0000 mg | ORAL_TABLET | Freq: Two times a day (BID) | ORAL | Status: DC

## 2014-11-09 MED ORDER — ONDANSETRON HCL 4 MG/2ML IJ SOLN
4.0000 mg | Freq: Four times a day (QID) | INTRAMUSCULAR | Status: DC | PRN
  Administered 2014-11-09: 4 mg via INTRAVENOUS
  Filled 2014-11-09: qty 2

## 2014-11-09 MED ORDER — IOHEXOL 300 MG/ML  SOLN
100.0000 mL | Freq: Once | INTRAMUSCULAR | Status: AC | PRN
  Administered 2014-11-09: 100 mL via INTRAVENOUS

## 2014-11-09 MED ORDER — AZITHROMYCIN 500 MG PO TABS
500.0000 mg | ORAL_TABLET | Freq: Every day | ORAL | Status: DC
  Filled 2014-11-09: qty 1

## 2014-11-09 MED ORDER — PANTOPRAZOLE SODIUM 40 MG IV SOLR
40.0000 mg | Freq: Two times a day (BID) | INTRAVENOUS | Status: DC
  Administered 2014-11-09 – 2014-11-10 (×3): 40 mg via INTRAVENOUS
  Filled 2014-11-09 (×5): qty 40

## 2014-11-09 MED ORDER — KCL IN DEXTROSE-NACL 10-5-0.45 MEQ/L-%-% IV SOLN
INTRAVENOUS | Status: DC
  Administered 2014-11-09: 11:00:00 via INTRAVENOUS
  Administered 2014-11-10 (×2): 100 mL/h via INTRAVENOUS
  Administered 2014-11-10: 01:00:00 via INTRAVENOUS
  Administered 2014-11-11: 1000 mL via INTRAVENOUS
  Administered 2014-11-11 – 2014-11-12 (×2): via INTRAVENOUS
  Filled 2014-11-09 (×11): qty 1000

## 2014-11-09 MED ORDER — METHYLPREDNISOLONE SODIUM SUCC 125 MG IJ SOLR
60.0000 mg | Freq: Two times a day (BID) | INTRAMUSCULAR | Status: DC
  Administered 2014-11-09 – 2014-11-11 (×5): 60 mg via INTRAVENOUS
  Filled 2014-11-09 (×5): qty 2

## 2014-11-09 MED ORDER — DEXTROSE 5 % IV SOLN
500.0000 mg | INTRAVENOUS | Status: AC
  Administered 2014-11-09 – 2014-11-11 (×3): 500 mg via INTRAVENOUS
  Filled 2014-11-09 (×3): qty 500

## 2014-11-09 MED ORDER — BUDESONIDE 0.25 MG/2ML IN SUSP
0.2500 mg | Freq: Two times a day (BID) | RESPIRATORY_TRACT | Status: DC
  Administered 2014-11-09 – 2014-11-12 (×6): 0.25 mg via RESPIRATORY_TRACT
  Filled 2014-11-09 (×8): qty 2

## 2014-11-09 MED ORDER — SODIUM CHLORIDE 0.9 % IV SOLN
1.5000 g | Freq: Four times a day (QID) | INTRAVENOUS | Status: DC
  Administered 2014-11-09 – 2014-11-12 (×14): 1.5 g via INTRAVENOUS
  Filled 2014-11-09 (×18): qty 1.5

## 2014-11-09 MED ORDER — IOHEXOL 300 MG/ML  SOLN: 25.0000 mL | Freq: Once | INTRAMUSCULAR | Status: DC | PRN

## 2014-11-09 MED ORDER — ARFORMOTEROL TARTRATE 15 MCG/2ML IN NEBU
15.0000 ug | INHALATION_SOLUTION | Freq: Two times a day (BID) | RESPIRATORY_TRACT | Status: DC
  Administered 2014-11-09 – 2014-11-10 (×2): 15 ug via RESPIRATORY_TRACT
  Filled 2014-11-09 (×6): qty 2

## 2014-11-09 MED ORDER — MORPHINE SULFATE (PF) 2 MG/ML IV SOLN
1.0000 mg | INTRAVENOUS | Status: DC | PRN
  Administered 2014-11-09 (×3): 1 mg via INTRAVENOUS
  Filled 2014-11-09 (×3): qty 1

## 2014-11-09 NOTE — Progress Notes (Signed)
[facility] PROGRESS NOTE  Neil Terry XBJ:[id number] DOB: [birth year] DOA: 11/07/2014 PCP: No primary care provider on file.  Brief Summary  Neil Terry is a 81 y.o. male with history of orthopaedic procedure to his R femur. Patient suffered a mechanical fall and landed on his R hip resulting in severe R hip pain, worse with attempt to move the right hip. He was unable to bear weight on the R hip. Surprisingly on X ray there was no hip fracture. His pain persisted so a CT scan was performed which also showed no fracture. He was admitted due to inability to walk.  9/8:  Progressive SOB and hypoxia.  Started on tx for pneumonia, COPD.  Also with increasing abdominal pain.  CT c/a/p ordered.     Assessment/Plan  Right hip pain without obvious fracture on x-ray or CT scan.  Despite limited range of motion of the right hip and leukocytosis, I doubt that he has septic arthritis given the tenderness to palpation over the left lateral hip, and pain with attempts at flexion of the knee.  -  Physical therapy evaluation  -  Appreciate orthopedics assistance, awaiting official recommendations -  D/c tylenol, mobic -  Convert to IV pain medication pending further treatment of breathing  Acute hypoxic respiratory failure with evidence of bilateral lower lobe "scarring" on CXR.  Per my interpretation, he is hyperinflated and on exam he has course rales at teh bilateral bases.  ddx includes CAP, atypical pneumonia including PCP, COPD with exac, acute heart failure exacerbation (less likely since no JVP or LEE), pulmonary fibrosis -  IS -  OOB to chair as tolerated -  Solumedrol -  Start brovana, budesonide, and duonebs -  Start unasyn (for aspiration given vomiting) and azithromycin (CAP) -  Sputum culture -  Legionella/s. pneumo -  HIV -  CT chest to eval "scarring" -  ABG -  ECHO and BNP  Abdominal pain, worsening and now having vomiting.  Hx of duodenal ulcer and extensive abdominal  [facility].  No flatus or BM in 48 hours, gassy on exam concerning for SBO -  Foley catheter placed without relief of pain -  Liver function tests and lipase within normal limits -  Urinalysis without obvious infection -  CT abd/pelvis   Acute urinary retention likely secondary to narcotics -  Post void residual was greater than 500 mL -  Foley catheter placement -  Minimize narcotics if possible -  Start Flomax  Leukocytosis, no evidence of pneumonia on chest x-ray, resolved   Mild hyponatremia due to dehydration -  Start IVF  Diet:  CLD Access:  PIV IVF:  yes Proph:  heparin  Code Status: full Family Communication: patient and his niece Disposition Plan:  Work up for respiratory distress ongoing  Consultants:   Orthopedics  Procedures:   Hip x-ray  CT femur right side  CT pelvis  X-ray femur right  Chest x-ray  Antibiotics:   None   HPI/Subjective:  (used pacific interpreter Arabic)  Continues to have severe pain in the right lower extremity and abdomen.  More SOB and states he does not think that he can work with PT today because he is so SOB.  No BM in 2 days.    Objective: Filed Vitals:   11/08/14 0929 11/08/14 1306 11/08/14 2107 11/09/14 0519  BP:   98/61 92/55  Pulse:   80 62  Temp: 98.1 F (36.7 C) 98.1 F (36.7 C) 98.2 F (36.8  C) 98.3 F (36.8 C)  TempSrc: Oral Oral    Resp:   17 18  SpO2:   90% 82%    Intake/Output Summary (Last 24 hours) at 11/09/14 0957 Last data filed at 11/09/14 1610  Gross per 24 hour  Intake    585 ml  Output    750 ml  Net   -165 ml   There were no vitals filed for this visit. There is no height or weight on file to calculate BMI.  Exam:   General:  Adult male, ill-appearing, mild respiratory distress with SCM, subcostal and supraclavicular retractions.    HEENT:  NCAT, MMM  Cardiovascular:  RRR, nl S1, S2 no mrg, 2+ pulses, warm extremities  Respiratory:  Course rales at the bilateral  bases, no wheezes, + rhonchi  Abdomen:   NABS, soft, ventral hernias which are reducible, tender to palpation in the epigastrium and slightly to the left without rebound or guarding.  MSK:   Normal tone and bulk, no LEE. Pain with ROM of the right hip, but no swelling  Neuro:  Grossly intact  Data Reviewed: Basic Metabolic Panel:  Recent Labs Lab 11/07/14 1945 11/09/14 0530  NA 135 133*  K 3.8 4.2  CL 98* 96*  CO2 25 32  GLUCOSE 85 95  BUN 13 19  CREATININE 0.95 1.16  CALCIUM 9.1 8.2*   Liver Function Tests:  Recent Labs Lab 11/07/14 1945  AST 26  ALT 14*  ALKPHOS 89  BILITOT 0.9  PROT 8.2*  ALBUMIN 4.1    Recent Labs Lab 11/08/14 1110  LIPASE 17*   No results for input(s): AMMONIA in the last 168 hours. CBC:  Recent Labs Lab 11/07/14 1945 11/09/14 0530  WBC 15.1* 9.1  NEUTROABS 13.1*  --   HGB 16.1 12.9*  HCT 49.1 41.1  MCV 89.6 91.5  PLT 225 176    No results found for this or any previous visit (from the past 240 hour(s)).   Studies: Ct Pelvis Wo Contrast  11/07/2014   CLINICAL DATA:  Right hip pain after a fall today.  EXAM: CT PELVIS AND RIGHT LOWER EXTREMITY WITHOUT CONTRAST  TECHNIQUE: Multidetector CT imaging of the pelvis and right lower extremity was performed according to the standard protocol without intravenous contrast. Multiplanar CT image reconstructions were also generated.  COMPARISON:  Pelvis and right hip radiographs 11/07/2014  FINDINGS: CT PELVIS FINDINGS  Diffuse bone demineralization. The sacrum and SI joints appear intact and symmetrical. Pelvis appears intact. No acute displaced fractures identified. Symphysis pubis is not displaced. No focal bone lesion or bone destruction. Incidental note of cement at L3 consistent with prior kyphoplasty. Compression of the superior and inferior endplates at L4. Changes are likely due to osteoporosis.  Soft tissue pelvis demonstrates vascular calcification in the abdominal aorta. There are  multiple ventral abdominal wall hernias containing colon and small bowel. No visualized bowel obstruction. Bladder wall is mildly thickened which may be due to cystitis or hypertrophy from outlet obstruction. Prostate gland is enlarged and prostate calcifications are present.  CT RIGHT LOWER EXTREMITY FINDINGS  Diffuse bone demineralization. Previous intra medullary rod fixation of the femur with distal locking screw. No evidence of displacement of hardware components. Old healed fracture deformity of the inter trochanteric region. No evidence of acute fracture or dislocation of the right femur. No focal bone lesion or bone destruction. No significant effusion at the knee. Soft tissues of the right leg demonstrate diffuse muscular fatty atrophy. Scattered vascular calcifications  in the superficial femoral artery. No focal fluid collection or hematoma.  IMPRESSION: Diffuse bone demineralization. Lower lumbar compression fractures consistent with osteoporosis. No acute fractures demonstrated in the pelvis or right femur. Previous intra medullary rod fixation of the right femur with and old healed fracture of the inter trochanteric region. Diffuse fatty atrophy of the right leg muscles.   Electronically Signed   By: Burman Nieves M.D.   On: 11/07/2014 22:16   Ct Femur Right Wo Contrast  11/07/2014   CLINICAL DATA:  Right hip pain after a fall today.  EXAM: CT PELVIS AND RIGHT LOWER EXTREMITY WITHOUT CONTRAST  TECHNIQUE: Multidetector CT imaging of the pelvis and right lower extremity was performed according to the standard protocol without intravenous contrast. Multiplanar CT image reconstructions were also generated.  COMPARISON:  Pelvis and right hip radiographs 11/07/2014  FINDINGS: CT PELVIS FINDINGS  Diffuse bone demineralization. The sacrum and SI joints appear intact and symmetrical. Pelvis appears intact. No acute displaced fractures identified. Symphysis pubis is not displaced. No focal bone lesion or  bone destruction. Incidental note of cement at L3 consistent with prior kyphoplasty. Compression of the superior and inferior endplates at L4. Changes are likely due to osteoporosis.  Soft tissue pelvis demonstrates vascular calcification in the abdominal aorta. There are multiple ventral abdominal wall hernias containing colon and small bowel. No visualized bowel obstruction. Bladder wall is mildly thickened which may be due to cystitis or hypertrophy from outlet obstruction. Prostate gland is enlarged and prostate calcifications are present.  CT RIGHT LOWER EXTREMITY FINDINGS  Diffuse bone demineralization. Previous intra medullary rod fixation of the femur with distal locking screw. No evidence of displacement of hardware components. Old healed fracture deformity of the inter trochanteric region. No evidence of acute fracture or dislocation of the right femur. No focal bone lesion or bone destruction. No significant effusion at the knee. Soft tissues of the right leg demonstrate diffuse muscular fatty atrophy. Scattered vascular calcifications in the superficial femoral artery. No focal fluid collection or hematoma.  IMPRESSION: Diffuse bone demineralization. Lower lumbar compression fractures consistent with osteoporosis. No acute fractures demonstrated in the pelvis or right femur. Previous intra medullary rod fixation of the right femur with and old healed fracture of the inter trochanteric region. Diffuse fatty atrophy of the right leg muscles.   Electronically Signed   By: Burman Nieves M.D.   On: 11/07/2014 22:16   Dg Chest Port 1 View  11/08/2014   CLINICAL DATA:  Patient with hypoxia.  EXAM: PORTABLE CHEST - 1 VIEW  COMPARISON:  Chest radiograph 09/17/2005  FINDINGS: Monitoring leads overlie the patient. Stable enlarged cardiac and mediastinal contours. Tortuosity of the thoracic aorta. Elevation right hemidiaphragm. No consolidative pulmonary opacities. Chronic bilateral predominantly lower lung  interstitial opacities, likely represent scarring. Abnormal appearance to the proximal humerus/humeral head, incompletely visualized.  IMPRESSION: Abnormal appearance to the proximal humerus/humeral which is incompletely visualized. Recommend dedicated views as this may potentially represent degenerative changes, abnormal appearance from positioning, old fracture or acute injury.  No acute cardiopulmonary process.   Electronically Signed   By: Annia Belt M.D.   On: 11/08/2014 11:30   Dg Hip Unilat With Pelvis 2-3 Views Right  11/07/2014   CLINICAL DATA:  Status post fall today with right-sided hip pain, history of hip fracture 6 years ago.  EXAM: DG HIP (WITH OR WITHOUT PELVIS) 2-3V RIGHT  COMPARISON:  None.  FINDINGS: The bones are osteopenic. There is old deformity of the intertrochanteric region  of the right hip. The femoral head is intact. The metallic rod is in reasonable position. The observed portions of the right hemipelvis exhibit no acute fracture. There is chronic deformity of the superior pubic ramus on the left.  IMPRESSION: There is no acute bony abnormality of the right hip or the visualized portions of the right hemipelvis.   Electronically Signed   By: David  [place] M.D.   On: 11/07/2014 17:25   Dg Femur, Min 2 Views Right  11/08/2014   CLINICAL DATA:  81 year old male unable to bear weight on the right leg.  EXAM: RIGHT FEMUR 2 VIEWS  COMPARISON:  CT of the right lower extremity dated 11/07/2014  FINDINGS: An intra medullary rod is noted in the right femur. There is old healed fracture of the right intertrochanteric region. The bones are osteopenic. No acute fracture identified.  IMPRESSION: No acute fracture or dislocation. Right femoral intra medullary rod.   Electronically Signed   By: Elgie Collard M.D.   On: 11/08/2014 02:01    Scheduled Meds: . acetaminophen  1,000 mg Oral TID  . arformoterol  15 mcg Nebulization BID  . azithromycin  500 mg Oral Daily  . budesonide (PULMICORT)  nebulizer solution  0.25 mg Nebulization BID  . heparin  5,000 Units Subcutaneous 3 times per day  . ipratropium-albuterol  3 mL Nebulization TID  . meloxicam  7.5 mg Oral Daily  . methylPREDNISolone (SOLU-MEDROL) injection  60 mg Intravenous BID  . pneumococcal 23 valent vaccine  0.5 mL Intramuscular Tomorrow-1000  . tamsulosin  0.4 mg Oral Daily   Continuous Infusions:   Principal Problem:   Right hip pain Active Problems:   Acute urinary retention   Acute respiratory failure with hypoxia   Fall   Hypoxia    Time spent: 30 min    Keyoni Lapinski, [facility]  [facility] Pager [phone]. If 7PM-7AM, please contact night-coverage at [email or web address], password [email or web address] 11/09/2014, 9:57 AM

## 2014-11-09 NOTE — Consult Note (Signed)
ANTIBIOTIC CONSULT NOTE - INITIAL  Pharmacy Consult for Unasyn Indication: pneumonia  No Known Allergies    Vital Signs: Temp: 98.3 F (36.8 C) (09/08 0519) BP: 92/55 mmHg (09/08 0519) Pulse Rate: 62 (09/08 0519) Intake/Output from previous day: 09/07 0701 - 09/08 0700 In: 135 [P.O.:135] Out: 750 [Urine:750] Intake/Output from this shift: Total I/O In: 450 [P.O.:450] Out: -   Labs:  Recent Labs  11/07/14 1945 11/09/14 0530  WBC 15.1* 9.1  HGB 16.1 12.9*  PLT 225 176  CREATININE 0.95 1.16   CrCl cannot be calculated (Unknown ideal weight.). No results for input(s): VANCOTROUGH, VANCOPEAK, VANCORANDOM, GENTTROUGH, GENTPEAK, GENTRANDOM, TOBRATROUGH, TOBRAPEAK, TOBRARND, AMIKACINPEAK, AMIKACINTROU, AMIKACIN in the last 72 hours.   Microbiology: No results found for this or any previous visit (from the past 720 hour(s)).  Medical History: History reviewed. No pertinent past medical history.  Assessment: 81 yo male presenting after a wall with right hip pain  PMH: Joint replacement  ID: aspiration pna with intermittent hypoxia. WBC 9.1. AF  Unasyn 9/8>>  Blood x 2  9/7 Urine  Renal: SCr 1.16  Plan:  Unasyn 1.5 gm q6h Monitor clinical status, culture results, LOT  Neil Terry, PharmD, BCPS Clinical Pharmacist Pager [phone] 11/09/2014 10:24 AM

## 2014-11-09 NOTE — Progress Notes (Signed)
PT Cancellation Note  Patient Details Name: Sayan Aldava MRN: [id number] DOB: 07-30-1927   Cancelled Treatment:    Reason Eval/Treat Not Completed: Medical issues which prohibited therapy. Nursing refusing PT at this time due to hypoxia and pain. Will continue to follow.    Christiane Ha, PT, CSCS Pager [phone] Office [phone]  11/09/2014, 11:27 AM

## 2014-11-09 NOTE — Progress Notes (Signed)
     Subjective:  Patient had R hip pain after a mechanical fall at home.  No fracture on xray or CT scan.  Patient reports pain as moderate.  Resting comfortably in bed.  Has not been evaluated by PT yet, but will allow for WBAT at this time given no obvious fractures on imaging studies.  Objective:   VITALS:   Filed Vitals:   11/08/14 0929 11/08/14 1306 11/08/14 2107 11/09/14 0519  BP:   98/61 92/55  Pulse:   80 62  Temp: 98.1 F (36.7 C) 98.1 F (36.7 C) 98.2 F (36.8 C) 98.3 F (36.8 C)  TempSrc: Oral Oral    Resp:   17 18  SpO2:   90% 82%    Neurologically intact ABD soft Neurovascular intact Sensation intact distally Intact pulses distally Dorsiflexion/Plantar flexion intact R leg is shortened and externally rotated but the patient's son reports that this is his baseline  Lab Results  Component Value Date   WBC 9.1 11/09/2014   HGB 12.9* 11/09/2014   HCT 41.1 11/09/2014   MCV 91.5 11/09/2014   PLT 176 11/09/2014   BMET    Component Value Date/Time   NA 133* 11/09/2014 0530   K 4.2 11/09/2014 0530   CL 96* 11/09/2014 0530   CO2 32 11/09/2014 0530   GLUCOSE 95 11/09/2014 0530   BUN 19 11/09/2014 0530   CREATININE 1.16 11/09/2014 0530   CALCIUM 8.2* 11/09/2014 0530   GFRNONAA 56* 11/09/2014 0530   GFRAA >60 11/09/2014 0530     Assessment/Plan:     Principal Problem:   Right hip pain Active Problems:   Acute urinary retention   Acute respiratory failure with hypoxia   Fall   Hypoxia   Up with therapy WBAT in the RLE No fracture noted on imaging studies.  No indication for orthopedic surgical intervention.  Will see how things go with PT today.    Jessie Cowher Hilda Lias 11/09/2014, 8:50 AM Cell [phone]

## 2014-11-10 ENCOUNTER — Encounter (HOSPITAL_COMMUNITY): Payer: Self-pay | Admitting: Internal Medicine

## 2014-11-10 ENCOUNTER — Ambulatory Visit (HOSPITAL_COMMUNITY): Payer: Medicare Other

## 2014-11-10 ENCOUNTER — Inpatient Hospital Stay (HOSPITAL_COMMUNITY): Payer: Medicare Other

## 2014-11-10 DIAGNOSIS — I714 Abdominal aortic aneurysm, without rupture, unspecified: Secondary | ICD-10-CM

## 2014-11-10 DIAGNOSIS — R1011 Right upper quadrant pain: Secondary | ICD-10-CM

## 2014-11-10 DIAGNOSIS — R1013 Epigastric pain: Secondary | ICD-10-CM

## 2014-11-10 DIAGNOSIS — R079 Chest pain, unspecified: Secondary | ICD-10-CM

## 2014-11-10 DIAGNOSIS — N400 Enlarged prostate without lower urinary tract symptoms: Secondary | ICD-10-CM

## 2014-11-10 DIAGNOSIS — N281 Cyst of kidney, acquired: Secondary | ICD-10-CM

## 2014-11-10 DIAGNOSIS — J441 Chronic obstructive pulmonary disease with (acute) exacerbation: Secondary | ICD-10-CM

## 2014-11-10 DIAGNOSIS — R072 Precordial pain: Secondary | ICD-10-CM

## 2014-11-10 DIAGNOSIS — J189 Pneumonia, unspecified organism: Secondary | ICD-10-CM

## 2014-11-10 DIAGNOSIS — I480 Paroxysmal atrial fibrillation: Secondary | ICD-10-CM

## 2014-11-10 DIAGNOSIS — I4891 Unspecified atrial fibrillation: Secondary | ICD-10-CM

## 2014-11-10 DIAGNOSIS — K439 Ventral hernia without obstruction or gangrene: Secondary | ICD-10-CM

## 2014-11-10 LAB — LIPID PANEL
Cholesterol: 185 mg/dL (ref 0–200)
HDL: 48 mg/dL (ref >40)
LDL Cholesterol: 121 mg/dL — ABNORMAL HIGH (ref 0–99)
TRIGLYCERIDES: 78 mg/dL (ref <150)
Total CHOL/HDL Ratio: 3.9 RATIO
VLDL: 16 mg/dL (ref 0–40)

## 2014-11-10 LAB — BASIC METABOLIC PANEL
ANION GAP: 6 (ref 5–15)
BUN: 10 mg/dL (ref 6–20)
CALCIUM: 8.5 mg/dL — AB (ref 8.9–10.3)
CO2: 29 mmol/L (ref 22–32)
Chloride: 99 mmol/L — ABNORMAL LOW (ref 101–111)
Creatinine, Ser: 0.81 mg/dL (ref 0.61–1.24)
Glucose, Bld: 185 mg/dL — ABNORMAL HIGH (ref 65–99)
Potassium: 4.5 mmol/L (ref 3.5–5.1)
Sodium: 134 mmol/L — ABNORMAL LOW (ref 135–145)

## 2014-11-10 LAB — TROPONIN I: TROPONIN I: 0.05 ng/mL — AB (ref <0.031)

## 2014-11-10 LAB — TSH: TSH: 0.383 u[IU]/mL (ref 0.350–4.500)

## 2014-11-10 LAB — CBC
HCT: 40 % (ref 39.0–52.0)
HEMOGLOBIN: 13.2 g/dL (ref 13.0–17.0)
MCH: 29.5 pg (ref 26.0–34.0)
MCHC: 33 g/dL (ref 30.0–36.0)
MCV: 89.3 fL (ref 78.0–100.0)
PLATELETS: 173 10*3/uL (ref 150–400)
RBC: 4.48 MIL/uL (ref 4.22–5.81)
RDW: 13.5 % (ref 11.5–15.5)
WBC: 9.1 10*3/uL (ref 4.0–10.5)

## 2014-11-10 LAB — MAGNESIUM: MAGNESIUM: 2 mg/dL (ref 1.7–2.4)

## 2014-11-10 LAB — HIV ANTIBODY (ROUTINE TESTING W REFLEX): HIV SCREEN 4TH GENERATION: NONREACTIVE

## 2014-11-10 LAB — HEPARIN LEVEL (UNFRACTIONATED): HEPARIN UNFRACTIONATED: 0.52 [IU]/mL (ref 0.30–0.70)

## 2014-11-10 MED ORDER — ASPIRIN 81 MG PO CHEW
324.0000 mg | CHEWABLE_TABLET | Freq: Once | ORAL | Status: AC
  Administered 2014-11-10: 324 mg via ORAL
  Filled 2014-11-10: qty 4

## 2014-11-10 MED ORDER — HEPARIN (PORCINE) IN NACL 100-0.45 UNIT/ML-% IJ SOLN
1050.0000 [IU]/h | INTRAMUSCULAR | Status: DC
  Administered 2014-11-10 – 2014-11-11 (×2): 1050 [IU]/h via INTRAVENOUS
  Filled 2014-11-10 (×3): qty 250

## 2014-11-10 MED ORDER — SODIUM CHLORIDE 0.9 % IV BOLUS (SEPSIS)
500.0000 mL | Freq: Once | INTRAVENOUS | Status: AC
  Administered 2014-11-10: 500 mL via INTRAVENOUS

## 2014-11-10 MED ORDER — METOPROLOL TARTRATE 12.5 MG HALF TABLET
12.5000 mg | ORAL_TABLET | Freq: Two times a day (BID) | ORAL | Status: DC
  Administered 2014-11-10 – 2014-11-12 (×3): 12.5 mg via ORAL
  Filled 2014-11-10 (×3): qty 1

## 2014-11-10 MED ORDER — DIGOXIN 0.25 MG/ML IJ SOLN
0.2500 mg | Freq: Once | INTRAMUSCULAR | Status: AC
  Administered 2014-11-10: 0.25 mg via INTRAVENOUS
  Filled 2014-11-10: qty 1

## 2014-11-10 MED ORDER — LEVALBUTEROL HCL 1.25 MG/0.5ML IN NEBU
1.2500 mg | INHALATION_SOLUTION | Freq: Three times a day (TID) | RESPIRATORY_TRACT | Status: DC
  Administered 2014-11-11 – 2014-11-12 (×5): 1.25 mg via RESPIRATORY_TRACT
  Filled 2014-11-10 (×9): qty 0.5

## 2014-11-10 MED ORDER — ASPIRIN EC 325 MG PO TBEC
325.0000 mg | DELAYED_RELEASE_TABLET | Freq: Every day | ORAL | Status: DC
  Administered 2014-11-11 – 2014-11-12 (×2): 325 mg via ORAL
  Filled 2014-11-10 (×2): qty 1

## 2014-11-10 MED ORDER — NITROGLYCERIN 0.4 MG SL SUBL
0.4000 mg | SUBLINGUAL_TABLET | SUBLINGUAL | Status: DC | PRN
  Administered 2014-11-10 (×2): 0.4 mg via SUBLINGUAL
  Filled 2014-11-10: qty 1

## 2014-11-10 MED ORDER — NITROGLYCERIN IN D5W 200-5 MCG/ML-% IV SOLN
0.0000 ug/min | INTRAVENOUS | Status: DC
  Filled 2014-11-10: qty 250

## 2014-11-10 MED ORDER — HEPARIN BOLUS VIA INFUSION
3000.0000 [IU] | Freq: Once | INTRAVENOUS | Status: AC
  Administered 2014-11-10: 3000 [IU] via INTRAVENOUS
  Filled 2014-11-10: qty 3000

## 2014-11-10 MED ORDER — METOPROLOL TARTRATE 1 MG/ML IV SOLN
5.0000 mg | Freq: Once | INTRAVENOUS | Status: AC
  Administered 2014-11-10: 5 mg via INTRAVENOUS
  Filled 2014-11-10: qty 5

## 2014-11-10 MED ORDER — PANTOPRAZOLE SODIUM 40 MG PO TBEC
40.0000 mg | DELAYED_RELEASE_TABLET | Freq: Every day | ORAL | Status: DC
  Administered 2014-11-11 – 2014-11-12 (×2): 40 mg via ORAL
  Filled 2014-11-10 (×3): qty 1

## 2014-11-10 MED ORDER — GI COCKTAIL ~~LOC~~
30.0000 mL | Freq: Once | ORAL | Status: AC
  Administered 2014-11-10: 30 mL via ORAL
  Filled 2014-11-10: qty 30

## 2014-11-10 MED ORDER — METOPROLOL TARTRATE 12.5 MG HALF TABLET: 12.5000 mg | ORAL_TABLET | Freq: Two times a day (BID) | ORAL | Status: DC

## 2014-11-10 NOTE — Consult Note (Addendum)
ANTICOAGULATION CONSULT NOTE - Initial Consult  Pharmacy Consult for heparin Indication: atrial fibrillation  No Known Allergies  Patient Measurements: Height:  (167.6 cm) Weight: 160 lb 4.4 oz (72.7 kg) IBW/kg (Calculated) : 63.8  Vital Signs: Temp: 97.6 F (36.4 C) (09/09 0515) BP: 134/68 mmHg (09/09 0515) Pulse Rate: 65 (09/09 0941)  Labs:  Recent Labs  11/07/14 1945 11/09/14 0530 11/10/14 0520  HGB 16.1 12.9* 13.2  HCT 49.1 41.1 40.0  PLT 225 176 173  CREATININE 0.95 1.16 0.81    Estimated Creatinine Clearance: 61.3 mL/min (by C-G formula based on Cr of 0.81).  Assessment: 81 yo male presenting after a wall with right hip pain  PMH: Joint replacement  AC: new onset a fib (9/9). No AC PTA  ID: aspiration pna with intermittent hypoxia. WBC 9.1. AF  Unasyn 9/8>>  Blood x 2  9/7 Urine  Renal: SCr 1.16  Heme: H&H 13.2/40, plt 173  Goal of Therapy:  Heparin level 0.3-0.7 units/ml Monitor platelets by anticoagulation protocol: Yes   Plan:  Heparin bolus of 3000 units followed by 1050 units/hr 2000 HL Daily HL, CBC Monitor for s/sx of bleeding F/U plans for long-term [facility]  Isaac Bliss, PharmD, [facility] Clinical Pharmacist Pager [phone] 11/10/2014 2:31 PM

## 2014-11-10 NOTE — Evaluation (Signed)
Clinical/Bedside Swallow Evaluation Patient Details  Name: Neil Terry MRN: [id number] Date of Birth: 1927/09/21  Today's Date: 11/10/2014 Time: SLP Start Time (ACUTE ONLY): 1515 SLP Stop Time (ACUTE ONLY): 1530 SLP Time Calculation (min) (ACUTE ONLY): 15 min  Past Medical History:  Past Medical History  Diagnosis Date  . Duodenal ulcer   . BPH (benign prostatic hyperplasia)   . COPD (chronic obstructive pulmonary disease)   . GERD (gastroesophageal reflux disease)   . HTN (hypertension)    Past Surgical History:  Past Surgical History  Procedure Laterality Date  . Joint replacement    . Transurethral resection of prostate    . Ventral hernia repair    . Gastrojejunostomy      for a perf ulcer in 2013-14   HPI:  Neil Terry is a 81 y.o. male with history of orthopaedic procedure to his R femur. Patient suffered a mechanical fall today at home and landed on his R hip.Per RN, patient had nausea yesterday and there was concern for dysphagia.   Assessment / Plan / Recommendation Clinical Impression    Patient presents with a functional oral and pharyngeal swallow with no overt s/s aspiration or penetration. Swallow initiation was timely and laryngeal elevation was [facility]. Patient did not c/o any discomfort or difficulty with swallowing puree solids or thin liquids.   Per RN and son's report, patient appears with an esophageal dysphagia (GERD).    Aspiration Risk  Mild  Diet Recommendation Age appropriate regular solids;Thin   Medication Administration: Whole meds with liquid    Other  Recommendations   follow for GERD  Follow Up Recommendations    N/A   Frequency and Duration   N/A     Pertinent Vitals/Pain     SLP Swallow Goals     Swallow Study Prior Functional Status       General Date of Onset: 11/09/14 Other Pertinent Information: Neil Terry is a 81 y.o. male with history of orthopaedic procedure to his R femur. Patient suffered a mechanical fall today  at home and landed on his R hip.Per RN, patient had nausea yesterday and there was concern for dysphagia.    Oral/Motor/Sensory Function Overall Oral Motor/Sensory Function: Appears within functional limits for tasks assessed   Ice Chips Ice chips: Not tested   Thin Liquid Thin Liquid: Within functional limits Presentation: Cup;Self Fed Other Comments: No overt s/s aspiration with thin liquids    Nectar Thick Nectar Thick Liquid: Not tested   Honey Thick Honey Thick Liquid: Not tested   Puree Puree: Within functional limits Presentation: Self Fed;Spoon Other Comments: No overt s/s aspiration or difficulty with puree solids   Solid   GO    Solid: Not tested       Neil Terry 11/10/2014,4:04 PM    Angela Nevin, MA, CCC-SLP 11/10/2014 4:04 PM

## 2014-11-10 NOTE — Consult Note (Signed)
Reason for Consult: gallbladder wall thickening  Referring Physician: Dr. Janece Canterbury   HPI: Neil Terry is a 81 year old male with a history of COPD, BPH and gastrojejunostomy for a perforated ulcer 2-3 years ago who came in on 9/616 after a fall.  Since admission, he developed abdominal pain and leukocytosis.  He was noted to have urinary retention.  Urine culture has not yielded any growth.  WBC has normalized.  The patient mostly complains of epigastric abdominal pain which worsens after he eat or if he eats too much.  No RUQ abdominal pain, no nausea or vomiting.  He also complains of soreness to lower abdomen.  He is having bowel movements.  He reports incisional hernia which have been ongoing since his surgery.  He has seen a Dr. Jarvis Newcomer in [place] who did not feel an a hernia repair was indicated given his age.  Appetite has been good. Denies fevers.  A CT of abdomen and pelvis on 9/8 showed pneumobilia, but no pneumatosis, minimal thickening of gallbladder wall.  We have therefore been asked to evaluate.   Today, the patient was noted to have hypotension, tachycardia and went into AF.  Currently, he is hemodynamically stable.  Heart rate is regular.    Note: patient and family did not wish to utilize interpreter.  Offered several times.  LFTs normal.    Past Medical History  Diagnosis Date  . Duodenal ulcer   . BPH (benign prostatic hyperplasia)   . COPD (chronic obstructive pulmonary disease)     Past Surgical History  Procedure Laterality Date  . Joint replacement    . Transurethral resection of prostate    . Ventral hernia repair    . Gastrojejunostomy      for a perf ulcer in 2013-14    History reviewed. No pertinent family history.  Social History:  reports that he has quit smoking. He does not have any smokeless tobacco history on file. He reports that he does not drink alcohol or use illicit drugs.  Allergies: No Known Allergies  Medications: I/O last 3 completed  shifts: In: 1423.3 [P.O.:705; I.V.:668.3; IV Piggyback:50] Out: 2355 [Urine:3550]       Results for orders placed or performed during the hospital encounter of 11/07/14 (from the past 48 hour(s))  Urinalysis, Routine w reflex microscopic (not at [facility])     Status: Abnormal   Collection Time: 11/08/14  4:08 PM  Result Value Ref Range   Color, Urine AMBER (A) YELLOW    Comment: BIOCHEMICALS MAY BE AFFECTED BY COLOR   APPearance CLOUDY (A) CLEAR   Specific Gravity, Urine 1.026 1.005 - 1.030   pH 5.0 5.0 - 8.0   Glucose, UA NEGATIVE NEGATIVE mg/dL   Hgb urine dipstick LARGE (A) NEGATIVE   Bilirubin Urine SMALL (A) NEGATIVE   Ketones, ur NEGATIVE NEGATIVE mg/dL   Protein, ur 100 (A) NEGATIVE mg/dL   Urobilinogen, UA 1.0 0.0 - 1.0 mg/dL   Nitrite NEGATIVE NEGATIVE   Leukocytes, UA SMALL (A) NEGATIVE  Culture, Urine     Status: None   Collection Time: 11/08/14  4:08 PM  Result Value Ref Range   Specimen Description URINE, RANDOM    Special Requests NONE    Culture NO GROWTH 1 DAY    Report Status 11/09/2014 FINAL   Urine microscopic-add on     Status: Abnormal   Collection Time: 11/08/14  4:08 PM  Result Value Ref Range   Squamous Epithelial / LPF FEW (A) RARE  WBC, UA 3-6 <3 WBC/hpf   RBC / HPF 21-50 <3 RBC/hpf   Bacteria, UA FEW (A) RARE   Casts HYALINE CASTS (A) NEGATIVE   Urine-Other MUCOUS PRESENT   Basic metabolic panel     Status: Abnormal   Collection Time: 11/09/14  5:30 AM  Result Value Ref Range   Sodium 133 (L) 135 - 145 mmol/L   Potassium 4.2 3.5 - 5.1 mmol/L   Chloride 96 (L) 101 - 111 mmol/L   CO2 32 22 - 32 mmol/L   Glucose, Bld 95 65 - 99 mg/dL   BUN 19 6 - 20 mg/dL   Creatinine, Ser 1.16 0.61 - 1.24 mg/dL   Calcium 8.2 (L) 8.9 - 10.3 mg/dL   GFR calc non Af Amer 56 (L) >60 mL/min   GFR calc Af Amer >60 >60 mL/min    Comment: (NOTE) The eGFR has been calculated using the CKD EPI equation. This calculation has not been validated in all clinical  situations. eGFR's persistently <60 mL/min signify possible Chronic Kidney Disease.    Anion gap 5 5 - 15  CBC     Status: Abnormal   Collection Time: 11/09/14  5:30 AM  Result Value Ref Range   WBC 9.1 4.0 - 10.5 K/uL   RBC 4.49 4.22 - 5.81 MIL/uL   Hemoglobin 12.9 (L) 13.0 - 17.0 g/dL   HCT 41.1 39.0 - 52.0 %   MCV 91.5 78.0 - 100.0 fL   MCH 28.7 26.0 - 34.0 pg   MCHC 31.4 30.0 - 36.0 g/dL   RDW 13.8 11.5 - 15.5 %   Platelets 176 150 - 400 K/uL  Blood gas, arterial     Status: Abnormal   Collection Time: 11/09/14 10:30 AM  Result Value Ref Range   O2 Content 3.0 L/min   Delivery systems NASAL CANNULA    pH, Arterial 7.346 (L) 7.350 - 7.450   pCO2 arterial 54.5 (H) 35.0 - 45.0 mmHg   pO2, Arterial 60.8 (L) 80.0 - 100.0 mmHg   Bicarbonate 29.0 (H) 20.0 - 24.0 mEq/L   TCO2 30.7 0 - 100 mmol/L   Acid-Base Excess 3.8 (H) 0.0 - 2.0 mmol/L   O2 Saturation 91.1 %   Patient temperature 98.6    Collection site RIGHT RADIAL    Drawn by [phone]    Sample type ARTERIAL DRAW    Allens test (pass/fail) PASS PASS  Culture, blood (routine x 2)     Status: None (Preliminary result)   Collection Time: 11/09/14 11:15 AM  Result Value Ref Range   Specimen Description BLOOD RIGHT ARM    Special Requests BOTTLES DRAWN AEROBIC AND ANAEROBIC 10CC    Culture NO GROWTH 1 DAY    Report Status PENDING   HIV antibody     Status: None   Collection Time: 11/09/14 11:25 AM  Result Value Ref Range   HIV Screen 4th Generation wRfx Non Reactive Non Reactive    Comment: (NOTE) Performed At: [facility] [place], [place] [id number] Lindon Romp MD KN:[id number]   Brain natriuretic peptide     Status: Abnormal   Collection Time: 11/09/14 11:25 AM  Result Value Ref Range   B Natriuretic Peptide 172.8 (H) 0.0 - 100.0 pg/mL  Culture, blood (routine x 2)     Status: None (Preliminary result)   Collection Time: 11/09/14 11:25 AM  Result Value Ref Range   Specimen Description  BLOOD LEFT ARM    Special Requests BOTTLES DRAWN  AEROBIC AND ANAEROBIC 5CC    Culture NO GROWTH 1 DAY    Report Status PENDING   Basic metabolic panel     Status: Abnormal   Collection Time: 11/10/14  5:20 AM  Result Value Ref Range   Sodium 134 (L) 135 - 145 mmol/L   Potassium 4.5 3.5 - 5.1 mmol/L   Chloride 99 (L) 101 - 111 mmol/L   CO2 29 22 - 32 mmol/L   Glucose, Bld 185 (H) 65 - 99 mg/dL   BUN 10 6 - 20 mg/dL   Creatinine, Ser 4.08 0.61 - 1.24 mg/dL   Calcium 8.5 (L) 8.9 - 10.3 mg/dL   GFR calc non Af Amer >60 >60 mL/min   GFR calc Af Amer >60 >60 mL/min    Comment: (NOTE) The eGFR has been calculated using the CKD EPI equation. This calculation has not been validated in all clinical situations. eGFR's persistently <60 mL/min signify possible Chronic Kidney Disease.    Anion gap 6 5 - 15  CBC     Status: None   Collection Time: 11/10/14  5:20 AM  Result Value Ref Range   WBC 9.1 4.0 - 10.5 K/uL   RBC 4.48 4.22 - 5.81 MIL/uL   Hemoglobin 13.2 13.0 - 17.0 g/dL   HCT 90.9 75.2 - 95.5 %   MCV 89.3 78.0 - 100.0 fL   MCH 29.5 26.0 - 34.0 pg   MCHC 33.0 30.0 - 36.0 g/dL   RDW 39.7 14.1 - 06.7 %   Platelets 173 150 - 400 K/uL  Troponin I (q 6hr x 3)     Status: None   Collection Time: 11/10/14 12:20 PM  Result Value Ref Range   Troponin I <0.03 <0.031 ng/mL    Comment:        NO INDICATION OF MYOCARDIAL INJURY.   Lipid panel     Status: Abnormal   Collection Time: 11/10/14 12:20 PM  Result Value Ref Range   Cholesterol 185 0 - 200 mg/dL   Triglycerides 78 <761 mg/dL   HDL 48 >60 mg/dL   Total CHOL/HDL Ratio 3.9 RATIO   VLDL 16 0 - 40 mg/dL   LDL Cholesterol 760 (H) 0 - 99 mg/dL    Comment:        Total Cholesterol/HDL:CHD Risk Coronary Heart Disease Risk Table                     Men   Women  1/2 Average Risk   3.4   3.3  Average Risk       5.0   4.4  2 X Average Risk   9.6   7.1  3 X Average Risk  23.4   11.0        Use the calculated Patient Ratio above  and the CHD Risk Table to determine the patient's CHD Risk.        ATP III CLASSIFICATION (LDL):  <100     mg/dL   Optimal  667-855  mg/dL   Near or Above                    Optimal  130-159  mg/dL   Borderline  476-891  mg/dL   High  >552     mg/dL   Very High   TSH     Status: None   Collection Time: 11/10/14 12:20 PM  Result Value Ref Range   TSH 0.383 0.350 - 4.500 uIU/mL  Ct Chest W Contrast  11/09/2014   CLINICAL DATA:  acute respiratory failure, hypoxia and pulmonary infiltrates seen on CXR.  EXAM: CT CHEST, ABDOMEN, AND PELVIS WITH CONTRAST  TECHNIQUE: Multidetector CT imaging of the chest, abdomen and pelvis was performed following the standard protocol during bolus administration of intravenous contrast.  CONTRAST:  146mL OMNIPAQUE IOHEXOL 300 MG/ML  SOLN  COMPARISON:  None.  FINDINGS: CT CHEST FINDINGS  Mediastinum/Nodes: Mild cardiac enlargement. No pericardial effusion. Patulous esophagus containing oral contrast.  No significant hilar or mediastinal adenopathy. Thoracic aorta shows mild calcification.  Lungs/Pleura: Study suffers from mild motion artifact. Mild dependent infiltrate bilaterally likely atelectasis. Mild atelectasis inferior right middle lobe and inferior lingula. Trace pleural effusions.  Musculoskeletal: Deformity surgical neck right humerus partially visualized without acute fracture line. Correlate for history of prior trauma. Compression deformities throughout the thoracic spine ranging from mild 2 moderate severity. No retropulsion causing significant canal narrowing.  CT ABDOMEN PELVIS FINDINGS  Hepatobiliary: 1.5 cm cyst lateral left lobe of liver. Trace pneumobilia left lobe of liver. Minimal thickening of gallbladder wall with gallbladder wall enhancement. No gallstones identified. Common bile duct normal at 6 mm.  Pancreas: Normal  Spleen: Normal  Adrenals/Urinary Tract: Mild dilatation right ureter. Moderate dilatation left ureter. Bilateral dilatation of  the renal pelvises. Normal bilateral renal enhancement. Exophytic 1.8 cm lesion midpole left kidney average attenuation of 27 possibly a complicated cyst. Mild right renal vascular calcification. Foley balloon catheter noted. Severe bladder wall thickening diffusely. A few foci of air are seen near the dome of the bladder.  Stomach/Bowel: Status post gastrojejunostomy. Dehiscent midline abdominal wall musculature. Numerous loops of small and large bowel are involved with this.Many loops show narrowing at the hernia site, but there is no surrounding inflammatory change or wall edema, nor pneumatosis, to indicate strangulation. There is a tiny focus of gas in the anterior abdominal wall to the left of the hernia, image 95, which could be related to recent medication injection, and is not clearly related to the hernia other than by proximity.  Vascular/Lymphatic: Atherosclerotic calcification abdominal aorta. Minimal aneurysmal dilatation infrarenal aorta to 3 cm. Mild iliac artery dilatation to 17 mm on the left and 15 mm on the right.  Reproductive: Calcification within the prostate  Other: No ascites  Musculoskeletal: Status post kyphoplasty L1, L2 and L3 with compression deformities at these levels. Mild L4 compression deformity as well. Medullary nail right femur. Evidence of prior a dynamic hip screw left femur. Severe diffuse osteopenia.  IMPRESSION: 1. Dependent infiltrate right lower lobe, more likely due to atelectasis rather than pneumonia. Follow up recommended.  2.Trace left hepatic pneumobilia, etiology uncertain. No visualized pneumatosis involving bowel.  3. Left renal lesion may be complex cyst but mass not excluded. Consider renal ultrasound.  4. Prior gastrojejunostomy. Numerous loops of small and large bowel involved with anterior abdominal wall hernia. No evidence of obstruction or strangulation. Tiny focus of subcutaneous air to left of hernia may be a medication injection site.  5. Severe bladder  wall thickening causing mild to moderate hydronephrosis; differential includes hypertrophy, malignancy, and cystitis  6. Mild abdominal aortic aneurysm. Recommend followup by ultrasound in 3 years. This recommendation follows ACR consensus guidelines: White Paper of the ACR Incidental Findings Committee II on Vascular Findings. J Am Coll Radiol 2013; 56:256-389  7. Severe diffuse osteoporosis with numerous spinal compression deformities, status post kyphoplasty at multiple levels, and evidence of prior trauma to femurs and right humerus.  8. Minimal thickening  and enhancement of the gallbladder wall, may be incidental. Consider right upper quadrant ultrasound if symptomatic.  9.  Other nonacute findings as described above.   Electronically Signed   By: Skipper Cliche M.D.   On: 11/09/2014 16:39   Ct Abdomen Pelvis W Contrast  11/09/2014   CLINICAL DATA:  acute respiratory failure, hypoxia and pulmonary infiltrates seen on CXR.  EXAM: CT CHEST, ABDOMEN, AND PELVIS WITH CONTRAST  TECHNIQUE: Multidetector CT imaging of the chest, abdomen and pelvis was performed following the standard protocol during bolus administration of intravenous contrast.  CONTRAST:  146mL OMNIPAQUE IOHEXOL 300 MG/ML  SOLN  COMPARISON:  None.  FINDINGS: CT CHEST FINDINGS  Mediastinum/Nodes: Mild cardiac enlargement. No pericardial effusion. Patulous esophagus containing oral contrast.  No significant hilar or mediastinal adenopathy. Thoracic aorta shows mild calcification.  Lungs/Pleura: Study suffers from mild motion artifact. Mild dependent infiltrate bilaterally likely atelectasis. Mild atelectasis inferior right middle lobe and inferior lingula. Trace pleural effusions.  Musculoskeletal: Deformity surgical neck right humerus partially visualized without acute fracture line. Correlate for history of prior trauma. Compression deformities throughout the thoracic spine ranging from mild 2 moderate severity. No retropulsion causing significant  canal narrowing.  CT ABDOMEN PELVIS FINDINGS  Hepatobiliary: 1.5 cm cyst lateral left lobe of liver. Trace pneumobilia left lobe of liver. Minimal thickening of gallbladder wall with gallbladder wall enhancement. No gallstones identified. Common bile duct normal at 6 mm.  Pancreas: Normal  Spleen: Normal  Adrenals/Urinary Tract: Mild dilatation right ureter. Moderate dilatation left ureter. Bilateral dilatation of the renal pelvises. Normal bilateral renal enhancement. Exophytic 1.8 cm lesion midpole left kidney average attenuation of 27 possibly a complicated cyst. Mild right renal vascular calcification. Foley balloon catheter noted. Severe bladder wall thickening diffusely. A few foci of air are seen near the dome of the bladder.  Stomach/Bowel: Status post gastrojejunostomy. Dehiscent midline abdominal wall musculature. Numerous loops of small and large bowel are involved with this.Many loops show narrowing at the hernia site, but there is no surrounding inflammatory change or wall edema, nor pneumatosis, to indicate strangulation. There is a tiny focus of gas in the anterior abdominal wall to the left of the hernia, image 95, which could be related to recent medication injection, and is not clearly related to the hernia other than by proximity.  Vascular/Lymphatic: Atherosclerotic calcification abdominal aorta. Minimal aneurysmal dilatation infrarenal aorta to 3 cm. Mild iliac artery dilatation to 17 mm on the left and 15 mm on the right.  Reproductive: Calcification within the prostate  Other: No ascites  Musculoskeletal: Status post kyphoplasty L1, L2 and L3 with compression deformities at these levels. Mild L4 compression deformity as well. Medullary nail right femur. Evidence of prior a dynamic hip screw left femur. Severe diffuse osteopenia.  IMPRESSION: 1. Dependent infiltrate right lower lobe, more likely due to atelectasis rather than pneumonia. Follow up recommended.  2.Trace left hepatic pneumobilia,  etiology uncertain. No visualized pneumatosis involving bowel.  3. Left renal lesion may be complex cyst but mass not excluded. Consider renal ultrasound.  4. Prior gastrojejunostomy. Numerous loops of small and large bowel involved with anterior abdominal wall hernia. No evidence of obstruction or strangulation. Tiny focus of subcutaneous air to left of hernia may be a medication injection site.  5. Severe bladder wall thickening causing mild to moderate hydronephrosis; differential includes hypertrophy, malignancy, and cystitis  6. Mild abdominal aortic aneurysm. Recommend followup by ultrasound in 3 years. This recommendation follows ACR consensus guidelines: White Paper of the  ACR Incidental Findings Committee II on Vascular Findings. J Am Coll Radiol 2013; 74:142-395  7. Severe diffuse osteoporosis with numerous spinal compression deformities, status post kyphoplasty at multiple levels, and evidence of prior trauma to femurs and right humerus.  8. Minimal thickening and enhancement of the gallbladder wall, may be incidental. Consider right upper quadrant ultrasound if symptomatic.  9.  Other nonacute findings as described above.   Electronically Signed   By: Skipper Cliche M.D.   On: 11/09/2014 16:39    ROS Blood pressure 107/57, pulse 78, temperature 98.2 F (36.8 C), temperature source Oral, resp. rate 18, height $RemoveBe'5\' 6"'MIAnhiIiZ$  (1.676 m), weight 72.7 kg (160 lb 4.4 oz), SpO2 97 %. Physical Exam  Constitutional: He is oriented to person, place, and time. He appears well-developed and well-nourished. No distress.  Cardiovascular: Normal rate, regular rhythm, normal heart sounds and intact distal pulses.  Exam reveals no gallop and no friction rub.   No murmur heard. Respiratory: Effort normal and breath sounds normal. No respiratory distress. He has no wheezes. He has no rales. He exhibits no tenderness.  GI: Soft. Bowel sounds are normal.  2 ventral hernias, soft and reducible without erythema.   No TTP to  RUQ.    Musculoskeletal: Normal range of motion. He exhibits no edema or tenderness.  Neurological: He is alert and oriented to person, place, and time.  Skin: Skin is warm and dry. No rash noted. He is not diaphoretic. No erythema. No pallor.  Psychiatric: He has a normal mood and affect. His behavior is normal. Judgment and thought content normal.    Assessment/Plan: Abdominal pain-with complaints of recurrent epigastric abdominal pain, hx of perforated ulcer and gastrojejunostomy, recommend a GI consult.  I doubt his symptoms are secondary to cholecystitis.  HIDA scan cannot be done until tomorrow, will discuss whether to obtain an abdominal US for better characterization of the gallbladder.  Further recs to follow.   Ventral hernia-soft and reducible, not the source of his pain.   Xoe Hoe ANP-BC 11/10/2014, 2:29 PM

## 2014-11-10 NOTE — Progress Notes (Addendum)
PT Cancellation Note  Patient Details Name: Neil Terry MRN: [id number] DOB: 02-12-1928   Cancelled Treatment:    Reason Eval/Treat Not Completed: Medical issues which prohibited therapy Pt getting EKG at this time, per RN pt tachycardic in mid 120s. Will follow up in PM as appropriate.  Attempted in PM, RN continues to request hold. Getting Cardiology consult. Will await clearance from cardiology.  Blake Divine A Quinlan Mcfall 11/10/2014, 11:07 AM Mylo Red, PT, DPT [phone]

## 2014-11-10 NOTE — Consult Note (Signed)
ANTICOAGULATION CONSULT NOTE - Initial Consult  Pharmacy Consult for heparin Indication: atrial fibrillation  No Known Allergies  Patient Measurements: Height:  (167.6 cm) Weight: 160 lb 4.4 oz (72.7 kg) IBW/kg (Calculated) : 63.8  Vital Signs: Temp: 98.6 F (37 C) (09/09 2111) Temp Source: Oral (09/09 2111) BP: 131/67 mmHg (09/09 2111) Pulse Rate: 65 (09/09 2111)  Labs:  Recent Labs  11/09/14 0530 11/10/14 0520 11/10/14 1220 11/10/14 2101  HGB 12.9* 13.2  --   --   HCT 41.1 40.0  --   --   PLT 176 173  --   --   HEPARINUNFRC  --   --   --  0.52  CREATININE 1.16 0.81  --   --   TROPONINI  --   --  <0.03  --     Estimated Creatinine Clearance: 61.3 mL/min (by C-G formula based on Cr of 0.81).  Assessment: 81 yo male who presented to [facility] on 11/07/14 PM with right hip pain after a mechanical fall at home. He was started on IV heparin infusion today for atrial fibrillation.  The 8 hr heparin level = 0.52, therapeutic on heparin drip rate 1050 units/hr. No bleeding noted.    Goal of Therapy:  Heparin level 0.3-0.7 units/ml Monitor platelets by anticoagulation protocol: Yes   Plan:  Continue IV Heparin drip at 1050 units/hr F/u 5 AM heparin level to confirm remains therapeutic Daily HL, CBC Monitor for s/sx of bleeding F/U plans for long-term [facility]  Noah Delaine, RPh Clinical Pharmacist Pager: [phone] 11/10/2014 9:29 PM

## 2014-11-10 NOTE — Consult Note (Signed)
CONSULTATION NOTE  Reason for Consult: Chest pain, atrial fibrillation with RVR  Requesting Physician: Dr. Sheran Fava  Cardiologist: None (NEW)  HPI: This is a 81 y.o. male with relatively little known past medical history other than prior orthopedic procedure to his right femur and apparently prior abdominal surgery. Apparently suffered a mechanical fall at home on 11/07/2014 landing on his right hip. He had severe right hip pain and was felt to possibly have a fracture however his x-ray and CT scan were negative. He was evaluated by orthopedics who did not see a clear cause of his pain other than musculoskeletal. He shortly developed acute hypoxic respiratory failure the following day with notable hypoxia. He was started on treatment for pneumonia and COPD exacerbation. He was also complaining of increasing abdominal pain and a CT scan of the chest, abdomen and pelvis was ordered. This demonstrated possible acute cholecystitis with gallbladder wall thickening and pneumobilia. There was no evidence of significant coronary artery calcium although there were calcified plaques in the distal aortic arch. He then developed new onset A. fib with RVR and was experiencing chest pain. EKG shows ST depression in the inferior and lateral leads associated with tachycardia. Initial troponin is negative. He had relief of some chest pain with nitroglycerin however became hypotensive. He was started on heparin and metoprolol. He is also noted to have a 3 beat run of nonsustained VT. Cardiology is asked to consult regarding chest pain, new onset A. fib with RVR and hypotension.     PMHx:  Past Medical History  Diagnosis Date  . Duodenal ulcer   . BPH (benign prostatic hyperplasia)   . COPD (chronic obstructive pulmonary disease)   . GERD (gastroesophageal reflux disease)   . HTN (hypertension)    Past Surgical History  Procedure Laterality Date  . Joint replacement    . Transurethral resection of prostate     . Ventral hernia repair    . Gastrojejunostomy      for a perf ulcer in 2013-14    FAMHx: Family History  Problem Relation Age of Onset  . Arrhythmia Son   . Stroke Brother     SOCHx:  reports that he has quit smoking. He does not have any smokeless tobacco history on file. He reports that he does not drink alcohol or use illicit drugs.  ALLERGIES: No Known Allergies  ROS: Review of systems not obtained due to patient factors.  HOME MEDICATIONS: Prescriptions prior to admission  Medication Sig Dispense Refill Last Dose  . tobramycin-dexamethasone (TOBRADEX) ophthalmic solution Place 1 drop into both eyes every 4 (four) hours while awake.   11/07/2014 at Unknown time    HOSPITAL MEDICATIONS: I have reviewed the patient's current medications.  VITALS: Blood pressure 107/57, pulse 78, temperature 98.2 F (36.8 C), temperature source Oral, resp. rate 18, height _0  (1.676 m), weight 160 lb 4.4 oz (72.7 kg), SpO2 97 %.  PHYSICAL EXAM: (2 sons in the room during the exam to help with translation) General appearance: alert and no distress Neck: no carotid bruit and no JVD Lungs: diminished breath sounds bibasilar and dullness to percussion RLL Heart: regular rate and rhythm Abdomen: soft, mild TTP in the midepigastrium, mild guarding,no rebound Extremities: extremities normal, atraumatic, no cyanosis or edema Pulses: 2+ and symmetric Skin: Skin color, texture, turgor normal. No rashes or lesions Neurologic: Grossly normal Psych: Pleasant  LABS: Results for orders placed or performed during the hospital encounter of 11/07/14 (from the past 48 hour(s))  Urinalysis, Routine w reflex microscopic (not at [facility])     Status: Abnormal   Collection Time: 11/08/14  4:08 PM  Result Value Ref Range   Color, Urine AMBER (A) YELLOW    Comment: BIOCHEMICALS MAY BE AFFECTED BY COLOR   APPearance CLOUDY (A) CLEAR   Specific Gravity, Urine 1.026 1.005 - 1.030   pH 5.0 5.0 - 8.0    Glucose, UA NEGATIVE NEGATIVE mg/dL   Hgb urine dipstick LARGE (A) NEGATIVE   Bilirubin Urine SMALL (A) NEGATIVE   Ketones, ur NEGATIVE NEGATIVE mg/dL   Protein, ur 100 (A) NEGATIVE mg/dL   Urobilinogen, UA 1.0 0.0 - 1.0 mg/dL   Nitrite NEGATIVE NEGATIVE   Leukocytes, UA SMALL (A) NEGATIVE  Culture, Urine     Status: None   Collection Time: 11/08/14  4:08 PM  Result Value Ref Range   Specimen Description URINE, RANDOM    Special Requests NONE    Culture NO GROWTH 1 DAY    Report Status 11/09/2014 FINAL   Urine microscopic-add on     Status: Abnormal   Collection Time: 11/08/14  4:08 PM  Result Value Ref Range   Squamous Epithelial / LPF FEW (A) RARE   WBC, UA 3-6 <3 WBC/hpf   RBC / HPF 21-50 <3 RBC/hpf   Bacteria, UA FEW (A) RARE   Casts HYALINE CASTS (A) NEGATIVE   Urine-Other MUCOUS PRESENT   Basic metabolic panel     Status: Abnormal   Collection Time: 11/09/14  5:30 AM  Result Value Ref Range   Sodium 133 (L) 135 - 145 mmol/L   Potassium 4.2 3.5 - 5.1 mmol/L   Chloride 96 (L) 101 - 111 mmol/L   CO2 32 22 - 32 mmol/L   Glucose, Bld 95 65 - 99 mg/dL   BUN 19 6 - 20 mg/dL   Creatinine, Ser 1.16 0.61 - 1.24 mg/dL   Calcium 8.2 (L) 8.9 - 10.3 mg/dL   GFR calc non Af Amer 56 (L) >60 mL/min   GFR calc Af Amer >60 >60 mL/min    Comment: (NOTE) The eGFR has been calculated using the CKD EPI equation. This calculation has not been validated in all clinical situations. eGFR's persistently <60 mL/min signify possible Chronic Kidney Disease.    Anion gap 5 5 - 15  CBC     Status: Abnormal   Collection Time: 11/09/14  5:30 AM  Result Value Ref Range   WBC 9.1 4.0 - 10.5 K/uL   RBC 4.49 4.22 - 5.81 MIL/uL   Hemoglobin 12.9 (L) 13.0 - 17.0 g/dL   HCT 41.1 39.0 - 52.0 %   MCV 91.5 78.0 - 100.0 fL   MCH 28.7 26.0 - 34.0 pg   MCHC 31.4 30.0 - 36.0 g/dL   RDW 13.8 11.5 - 15.5 %   Platelets 176 150 - 400 K/uL  Blood gas, arterial     Status: Abnormal   Collection Time:  11/09/14 10:30 AM  Result Value Ref Range   O2 Content 3.0 L/min   Delivery systems NASAL CANNULA    pH, Arterial 7.346 (L) 7.350 - 7.450   pCO2 arterial 54.5 (H) 35.0 - 45.0 mmHg   pO2, Arterial 60.8 (L) 80.0 - 100.0 mmHg   Bicarbonate 29.0 (H) 20.0 - 24.0 mEq/L   TCO2 30.7 0 - 100 mmol/L   Acid-Base Excess 3.8 (H) 0.0 - 2.0 mmol/L   O2 Saturation 91.1 %   Patient temperature 98.6    Collection site RIGHT  RADIAL    Drawn by [phone]    Sample type ARTERIAL DRAW    Allens test (pass/fail) PASS PASS  Culture, blood (routine x 2)     Status: None (Preliminary result)   Collection Time: 11/09/14 11:15 AM  Result Value Ref Range   Specimen Description BLOOD RIGHT ARM    Special Requests BOTTLES DRAWN AEROBIC AND ANAEROBIC 10CC    Culture NO GROWTH 1 DAY    Report Status PENDING   HIV antibody     Status: None   Collection Time: 11/09/14 11:25 AM  Result Value Ref Range   HIV Screen 4th Generation wRfx Non Reactive Non Reactive    Comment: (NOTE) Performed At: [facility] [place], [place] [id number] Lindon Romp MD LK:[id number]   Brain natriuretic peptide     Status: Abnormal   Collection Time: 11/09/14 11:25 AM  Result Value Ref Range   B Natriuretic Peptide 172.8 (H) 0.0 - 100.0 pg/mL  Culture, blood (routine x 2)     Status: None (Preliminary result)   Collection Time: 11/09/14 11:25 AM  Result Value Ref Range   Specimen Description BLOOD LEFT ARM    Special Requests BOTTLES DRAWN AEROBIC AND ANAEROBIC 5CC    Culture NO GROWTH 1 DAY    Report Status PENDING   Basic metabolic panel     Status: Abnormal   Collection Time: 11/10/14  5:20 AM  Result Value Ref Range   Sodium 134 (L) 135 - 145 mmol/L   Potassium 4.5 3.5 - 5.1 mmol/L   Chloride 99 (L) 101 - 111 mmol/L   CO2 29 22 - 32 mmol/L   Glucose, Bld 185 (H) 65 - 99 mg/dL   BUN 10 6 - 20 mg/dL   Creatinine, Ser 0.81 0.61 - 1.24 mg/dL   Calcium 8.5 (L) 8.9 - 10.3 mg/dL   GFR calc non Af  Amer >60 >60 mL/min   GFR calc Af Amer >60 >60 mL/min    Comment: (NOTE) The eGFR has been calculated using the CKD EPI equation. This calculation has not been validated in all clinical situations. eGFR's persistently <60 mL/min signify possible Chronic Kidney Disease.    Anion gap 6 5 - 15  CBC     Status: None   Collection Time: 11/10/14  5:20 AM  Result Value Ref Range   WBC 9.1 4.0 - 10.5 K/uL   RBC 4.48 4.22 - 5.81 MIL/uL   Hemoglobin 13.2 13.0 - 17.0 g/dL   HCT 40.0 39.0 - 52.0 %   MCV 89.3 78.0 - 100.0 fL   MCH 29.5 26.0 - 34.0 pg   MCHC 33.0 30.0 - 36.0 g/dL   RDW 13.5 11.5 - 15.5 %   Platelets 173 150 - 400 K/uL  Troponin I (q 6hr x 3)     Status: None   Collection Time: 11/10/14 12:20 PM  Result Value Ref Range   Troponin I <0.03 <0.031 ng/mL    Comment:        NO INDICATION OF MYOCARDIAL INJURY.   Lipid panel     Status: Abnormal   Collection Time: 11/10/14 12:20 PM  Result Value Ref Range   Cholesterol 185 0 - 200 mg/dL   Triglycerides 78 <150 mg/dL   HDL 48 >40 mg/dL   Total CHOL/HDL Ratio 3.9 RATIO   VLDL 16 0 - 40 mg/dL   LDL Cholesterol 121 (H) 0 - 99 mg/dL    Comment:  Total Cholesterol/HDL:CHD Risk Coronary Heart Disease Risk Table                     Men   Women  1/2 Average Risk   3.4   3.3  Average Risk       5.0   4.4  2 X Average Risk   9.6   7.1  3 X Average Risk  23.4   11.0        Use the calculated Patient Ratio above and the CHD Risk Table to determine the patient's CHD Risk.        ATP III CLASSIFICATION (LDL):  <100     mg/dL   Optimal  100-129  mg/dL   Near or Above                    Optimal  130-159  mg/dL   Borderline  160-189  mg/dL   High  >190     mg/dL   Very High   TSH     Status: None   Collection Time: 11/10/14 12:20 PM  Result Value Ref Range   TSH 0.383 0.350 - 4.500 uIU/mL    IMAGING: Ct Chest W Contrast  11/09/2014   CLINICAL DATA:  acute respiratory failure, hypoxia and pulmonary infiltrates seen on  CXR.  EXAM: CT CHEST, ABDOMEN, AND PELVIS WITH CONTRAST  TECHNIQUE: Multidetector CT imaging of the chest, abdomen and pelvis was performed following the standard protocol during bolus administration of intravenous contrast.  CONTRAST:  133m OMNIPAQUE IOHEXOL 300 MG/ML  SOLN  COMPARISON:  None.  FINDINGS: CT CHEST FINDINGS  Mediastinum/Nodes: Mild cardiac enlargement. No pericardial effusion. Patulous esophagus containing oral contrast.  No significant hilar or mediastinal adenopathy. Thoracic aorta shows mild calcification.  Lungs/Pleura: Study suffers from mild motion artifact. Mild dependent infiltrate bilaterally likely atelectasis. Mild atelectasis inferior right middle lobe and inferior lingula. Trace pleural effusions.  Musculoskeletal: Deformity surgical neck right humerus partially visualized without acute fracture line. Correlate for history of prior trauma. Compression deformities throughout the thoracic spine ranging from mild 2 moderate severity. No retropulsion causing significant canal narrowing.  CT ABDOMEN PELVIS FINDINGS  Hepatobiliary: 1.5 cm cyst lateral left lobe of liver. Trace pneumobilia left lobe of liver. Minimal thickening of gallbladder wall with gallbladder wall enhancement. No gallstones identified. Common bile duct normal at 6 mm.  Pancreas: Normal  Spleen: Normal  Adrenals/Urinary Tract: Mild dilatation right ureter. Moderate dilatation left ureter. Bilateral dilatation of the renal pelvises. Normal bilateral renal enhancement. Exophytic 1.8 cm lesion midpole left kidney average attenuation of 27 possibly a complicated cyst. Mild right renal vascular calcification. Foley balloon catheter noted. Severe bladder wall thickening diffusely. A few foci of air are seen near the dome of the bladder.  Stomach/Bowel: Status post gastrojejunostomy. Dehiscent midline abdominal wall musculature. Numerous loops of small and large bowel are involved with this.Many loops show narrowing at the hernia  site, but there is no surrounding inflammatory change or wall edema, nor pneumatosis, to indicate strangulation. There is a tiny focus of gas in the anterior abdominal wall to the left of the hernia, image 95, which could be related to recent medication injection, and is not clearly related to the hernia other than by proximity.  Vascular/Lymphatic: Atherosclerotic calcification abdominal aorta. Minimal aneurysmal dilatation infrarenal aorta to 3 cm. Mild iliac artery dilatation to 17 mm on the left and 15 mm on the right.  Reproductive: Calcification within the prostate  Other:  No ascites  Musculoskeletal: Status post kyphoplasty L1, L2 and L3 with compression deformities at these levels. Mild L4 compression deformity as well. Medullary nail right femur. Evidence of prior a dynamic hip screw left femur. Severe diffuse osteopenia.  IMPRESSION: 1. Dependent infiltrate right lower lobe, more likely due to atelectasis rather than pneumonia. Follow up recommended.  2.Trace left hepatic pneumobilia, etiology uncertain. No visualized pneumatosis involving bowel.  3. Left renal lesion may be complex cyst but mass not excluded. Consider renal ultrasound.  4. Prior gastrojejunostomy. Numerous loops of small and large bowel involved with anterior abdominal wall hernia. No evidence of obstruction or strangulation. Tiny focus of subcutaneous air to left of hernia may be a medication injection site.  5. Severe bladder wall thickening causing mild to moderate hydronephrosis; differential includes hypertrophy, malignancy, and cystitis  6. Mild abdominal aortic aneurysm. Recommend followup by ultrasound in 3 years. This recommendation follows ACR consensus guidelines: White Paper of the ACR Incidental Findings Committee II on Vascular Findings. J Am Coll Radiol 2013; 12:878-676  7. Severe diffuse osteoporosis with numerous spinal compression deformities, status post kyphoplasty at multiple levels, and evidence of prior trauma to  femurs and right humerus.  8. Minimal thickening and enhancement of the gallbladder wall, may be incidental. Consider right upper quadrant ultrasound if symptomatic.  9.  Other nonacute findings as described above.   Electronically Signed   By: Skipper Cliche M.D.   On: 11/09/2014 16:39   Ct Abdomen Pelvis W Contrast  11/09/2014   CLINICAL DATA:  acute respiratory failure, hypoxia and pulmonary infiltrates seen on CXR.  EXAM: CT CHEST, ABDOMEN, AND PELVIS WITH CONTRAST  TECHNIQUE: Multidetector CT imaging of the chest, abdomen and pelvis was performed following the standard protocol during bolus administration of intravenous contrast.  CONTRAST:  131mL OMNIPAQUE IOHEXOL 300 MG/ML  SOLN  COMPARISON:  None.  FINDINGS: CT CHEST FINDINGS  Mediastinum/Nodes: Mild cardiac enlargement. No pericardial effusion. Patulous esophagus containing oral contrast.  No significant hilar or mediastinal adenopathy. Thoracic aorta shows mild calcification.  Lungs/Pleura: Study suffers from mild motion artifact. Mild dependent infiltrate bilaterally likely atelectasis. Mild atelectasis inferior right middle lobe and inferior lingula. Trace pleural effusions.  Musculoskeletal: Deformity surgical neck right humerus partially visualized without acute fracture line. Correlate for history of prior trauma. Compression deformities throughout the thoracic spine ranging from mild 2 moderate severity. No retropulsion causing significant canal narrowing.  CT ABDOMEN PELVIS FINDINGS  Hepatobiliary: 1.5 cm cyst lateral left lobe of liver. Trace pneumobilia left lobe of liver. Minimal thickening of gallbladder wall with gallbladder wall enhancement. No gallstones identified. Common bile duct normal at 6 mm.  Pancreas: Normal  Spleen: Normal  Adrenals/Urinary Tract: Mild dilatation right ureter. Moderate dilatation left ureter. Bilateral dilatation of the renal pelvises. Normal bilateral renal enhancement. Exophytic 1.8 cm lesion midpole left kidney  average attenuation of 27 possibly a complicated cyst. Mild right renal vascular calcification. Foley balloon catheter noted. Severe bladder wall thickening diffusely. A few foci of air are seen near the dome of the bladder.  Stomach/Bowel: Status post gastrojejunostomy. Dehiscent midline abdominal wall musculature. Numerous loops of small and large bowel are involved with this.Many loops show narrowing at the hernia site, but there is no surrounding inflammatory change or wall edema, nor pneumatosis, to indicate strangulation. There is a tiny focus of gas in the anterior abdominal wall to the left of the hernia, image 95, which could be related to recent medication injection, and is not clearly related to  the hernia other than by proximity.  Vascular/Lymphatic: Atherosclerotic calcification abdominal aorta. Minimal aneurysmal dilatation infrarenal aorta to 3 cm. Mild iliac artery dilatation to 17 mm on the left and 15 mm on the right.  Reproductive: Calcification within the prostate  Other: No ascites  Musculoskeletal: Status post kyphoplasty L1, L2 and L3 with compression deformities at these levels. Mild L4 compression deformity as well. Medullary nail right femur. Evidence of prior a dynamic hip screw left femur. Severe diffuse osteopenia.  IMPRESSION: 1. Dependent infiltrate right lower lobe, more likely due to atelectasis rather than pneumonia. Follow up recommended.  2.Trace left hepatic pneumobilia, etiology uncertain. No visualized pneumatosis involving bowel.  3. Left renal lesion may be complex cyst but mass not excluded. Consider renal ultrasound.  4. Prior gastrojejunostomy. Numerous loops of small and large bowel involved with anterior abdominal wall hernia. No evidence of obstruction or strangulation. Tiny focus of subcutaneous air to left of hernia may be a medication injection site.  5. Severe bladder wall thickening causing mild to moderate hydronephrosis; differential includes hypertrophy,  malignancy, and cystitis  6. Mild abdominal aortic aneurysm. Recommend followup by ultrasound in 3 years. This recommendation follows ACR consensus guidelines: White Paper of the ACR Incidental Findings Committee II on Vascular Findings. J Am Coll Radiol 2013; 89:169-450  7. Severe diffuse osteoporosis with numerous spinal compression deformities, status post kyphoplasty at multiple levels, and evidence of prior trauma to femurs and right humerus.  8. Minimal thickening and enhancement of the gallbladder wall, may be incidental. Consider right upper quadrant ultrasound if symptomatic.  9.  Other nonacute findings as described above.   Electronically Signed   By: Skipper Cliche M.D.   On: 11/09/2014 16:39    HOSPITAL DIAGNOSES: Principal Problem:   Right hip pain Active Problems:   Acute urinary retention   Acute respiratory failure with hypoxia   Fall   Hypoxia   Nausea and vomiting   CAP (community acquired pneumonia)   COPD with acute exacerbation   Abdominal pain, epigastric   Atrial fibrillation, new onset   Atrial fibrillation with RVR   Chest pain   RUQ pain   Renal cyst, right   AAA (abdominal aortic aneurysm) without rupture   Ventral hernia   BPH (benign prostatic hyperplasia)   IMPRESSION: 1. Atrial fibrillation with rapid ventricular response-new onset (CHADSVASC score 2) 2. Ischemic EKG changes with chest pain  RECOMMENDATION: Mr. Tibbitts had recent onset atrial fibrillation with rapid ventricular response which I think is related to infection, pain and likely metabolic derangement. Fortunately he converted to sinus after being loaded with beta blockers and the dose of digoxin. Blood pressure was low however it has improved. He did respond with improvement in chest pain to nitroglycerin. His EKG showed anterolateral ST segment depression of 2-3 mm when in RVR. There is concern for underlying ischemia. Initial troponin is negative. He is currently on aspirin and IV heparin.  He is not complaining of chest pain. I agree with reevaluating troponins and if they continue to be negative would recommend a Lexiscan nuclear stress test tomorrow. He should be restarted on low-dose metoprolol 12.5 mg twice daily. I agree with an echocardiogram, especially since he is back in sinus rhythm.  Cardiology will follow closely with you. Thank you for the consult.  Time Spent Directly with Patient: 45 minutes  Pixie Casino, MD, [facility] Attending Cardiologist [place] 11/10/2014, 2:49 PM

## 2014-11-10 NOTE — Progress Notes (Signed)
Patient has converted to NSR on telemetry.  Chest pain and SOB have improved.  First troponin is negative.  Will repeat ECG.  Continue to cycle troponins.  General surgery at bedside.  Less concerned about findings on CT that suggest possible acute cholecystitis and feel that his symptoms are more likely coming from possible duodenitis/gastritis or ulcer.  Advised GI consultation which I have called.  PA from GI has asked that family remain at bedside until she can see the patient which will be in several hours.  I have relayed this request to the RN currently taking care of the patient.

## 2014-11-10 NOTE — Progress Notes (Signed)
Patient seems to be improving in pain level but has not been able to mobilize with PT due to medical issues.  We will sign off at this time and plan to see the patient back in our clinic in 2 weeks for recheck.  If his condition changes, please notify.  Thanks  Levi Strauss PA-C

## 2014-11-10 NOTE — Progress Notes (Addendum)
[facility] PROGRESS NOTE  Bronislaw Terry AVW:[id number] DOB: March 30, 1927 DOA: 11/07/2014 PCP: No primary care provider on file.  Brief Summary  Neil Terry is a 81 y.o. male with history of orthopaedic procedure to his R femur. Patient suffered a mechanical fall and landed on his R hip resulting in severe R hip pain, worse with attempt to move the right hip. He was unable to bear weight on the R hip. Surprisingly on X ray there was no hip fracture. His pain persisted so a CT scan was performed which also showed no fracture. He was admitted due to inability to walk.  9/8:  Progressive SOB and hypoxia.  Started on tx for pneumonia, COPD.  Also with increasing abdominal pain.  CT c/a/p ordered.   9/9:  HIDA pending >> had to defer due to acute onset chest pain with a-fib with RVR.  Cards and General Surgery consulted  Assessment/Plan  Right hip pain without obvious fracture on x-ray or CT scan.  Despite limited range of motion of the right hip and leukocytosis, I doubt that he has septic arthritis given the tenderness to palpation over the left lateral hip, and pain with attempts at flexion of the knee.  -  Physical therapy evaluation  -  Appreciate orthopedics assistance, awaiting official recommendations -  D/c tylenol, mobic -  Convert to IV pain medication pending further treatment of breathing  Acute hypoxic respiratory failure with evidence of bilateral lower lobe "scarring" on CXR.  Likely a combination of acute COPD exacerbation and RLL pneumonia/aspiration pneumonia.  Markedly improved this morning. -  Continue Solumedrol -  D/c brovana and duonebs which may worsen his tachycardia (see below -  Continue budesonide and start xopenex -  Continue unasyn (for aspiration given vomiting) and azithromycin (CAP) -  Legionella/s. pneumo pending -  HIV NR  Chest pain with ST-segment depressions in the lateral leads and inferior leads which may also be due to tachycardia.  No ECG for  comparison.   -  Telemetry and transfer to stepdown -  Cycle troponins -  A1c and lipid panel -  ASA now and daily -  NTG prn >> chest pain has started to get better after two doses, but now hypotensive -  Start NTG gtt -  Cardiology consult  New-onset a-fib with RVR, likely triggered by pneumonia, possible acute cholecystitis.  CHA2DS2vasc score minimum of 2 based on age.  Awaiting A1c, ECHO.   -  troponins as above -  TSH -  ECHO pending -  Start heparin gtt -  Metoprolol  IV once and metoprolol 12.5mg  po BID for now (may need to start dilt gtt but will try to wait until patient transferred to a more monitored unit)  Nonsustained V-tach, 3 beats -  Potassium wnl -  Magnesium level added to stat labs -  ECHO ordered yesterday but still not complete  RUQ pain, Hx of duodenal ulcer and extensive abdominal surgery [facility].   -  CT abd/pelvis demonstrates possible acute cholecystitis with gallbladder wall thickening and pneumobilia. -  HIDA ordered, but on hold until HR and chest pain stabilized -  Foley catheter placed without relief of pain -  Liver function tests and lipase within normal limits -  Urinalysis without obvious infection  Acute urinary retention likely secondary to narcotics -  Post void residual was greater than 500 mL -  Foley catheter placement -  Minimize narcotics if possible -  Start Flomax  Leukocytosis, no evidence of  pneumonia on chest x-ray, resolved   Mild hyponatremia due to dehydration  Left renal cyst, possible complete.  Needs outpatient RUS which can be ordered by his Urologist  AAA, mild and needs repeat US in 11/2017  Bladder wall thickening likely secondary to BPH and chronic retention.  Had TURP last year and has urologist in [place].   -  F/u with Urology  Diet:  NPO Access:  PIV IVF:  yes Proph:  heparin  Code Status: full Family Communication: patient alone via interpreter  Disposition Plan:  Work up for a-fib with RVR,  chest pain, RUQ pain  Consultants:   Orthopedics  Procedures:   Hip x-ray  CT femur right side  CT pelvis  X-ray femur right  Chest x-ray  Antibiotics:   None   HPI/Subjective:  (used pacific interpreter Arabic)  Pain in the right lower extremity and abdomen.  Much less SOB today and talking comfortably.    Reexamination:  Sudden onset chest pain that radiates to the right shoulder and upper right arm with increased SOB.  Improved slightly after two doses of NTG.    Objective: Filed Vitals:   11/09/14 2000 11/09/14 2029 11/10/14 0515 11/10/14 0941  BP:  123/58 134/68   Pulse:  95 65 65  Temp:  99 F (37.2 C) 97.6 F (36.4 C)   TempSrc:  Oral    Resp:  19 18 18   SpO2: 98% 96% 96% 97%    Intake/Output Summary (Last 24 hours) at 11/10/14 1103 Last data filed at 11/10/14 0522  Gross per 24 hour  Intake 958.33 ml  Output   3400 ml  Net -2441.67 ml   There were no vitals filed for this visit. There is no height or weight on file to calculate BMI.  Exam:   General:  Adult male, NAD  HEENT:  NCAT, MMM  Cardiovascular:  RRR, nl S1, S2 no mrg, 2+ pulses, warm extremities   Respiratory:  Diminished at the right base, no wheezes, rales or rhonchi  Abdomen:   NABS, soft, ventral hernias which are reducible, tender to palpation in the epigastrium and now on the RUQ (previous was TTP in the LUQ)  MSK:   Normal tone and bulk, no LEE. Pain with ROM of the right hip, but no swelling  Neuro:  Grossly intact  Repeat exam after chest pain onset: GEN:  Pale, diaphoretic, ill-appearing CV:  IRRR and tachycardic PULM:  Diminished at right base and now wheezing some, no rhonchi ABD:  Persistent RUQ with positive Murphy sign  Data Reviewed: Basic Metabolic Panel:  Recent Labs Lab 11/07/14 1945 11/09/14 0530 11/10/14 0520  NA 135 133* 134*  K 3.8 4.2 4.5  CL 98* 96* 99*  CO2 25 32 29  GLUCOSE 85 95 185*  BUN 13 19 10   CREATININE 0.95 1.16 0.81  CALCIUM  9.1 8.2* 8.5*   Liver Function Tests:  Recent Labs Lab 11/07/14 1945  AST 26  ALT 14*  ALKPHOS 89  BILITOT 0.9  PROT 8.2*  ALBUMIN 4.1    Recent Labs Lab 11/08/14 1110  LIPASE 17*   No results for input(s): AMMONIA in the last 168 hours. CBC:  Recent Labs Lab 11/07/14 1945 11/09/14 0530 11/10/14 0520  WBC 15.1* 9.1 9.1  NEUTROABS 13.1*  --   --   HGB 16.1 12.9* 13.2  HCT 49.1 41.1 40.0  MCV 89.6 91.5 89.3  PLT 225 176 173    Recent Results (from the past 240 hour(s))  Culture, Urine     Status: None   Collection Time: 11/08/14  4:08 PM  Result Value Ref Range Status   Specimen Description URINE, RANDOM  Final   Special Requests NONE  Final   Culture NO GROWTH 1 DAY  Final   Report Status 11/09/2014 FINAL  Final  Culture, blood (routine x 2)     Status: None (Preliminary result)   Collection Time: 11/09/14 11:15 AM  Result Value Ref Range Status   Specimen Description BLOOD RIGHT ARM  Final   Special Requests BOTTLES DRAWN AEROBIC AND ANAEROBIC 10CC  Final   Culture PENDING  Incomplete   Report Status PENDING  Incomplete  Culture, blood (routine x 2)     Status: None (Preliminary result)   Collection Time: 11/09/14 11:25 AM  Result Value Ref Range Status   Specimen Description BLOOD LEFT ARM  Final   Special Requests BOTTLES DRAWN AEROBIC AND ANAEROBIC 5CC  Final   Culture PENDING  Incomplete   Report Status PENDING  Incomplete     Studies: Ct Chest W Contrast  11/09/2014   CLINICAL DATA:  acute respiratory failure, hypoxia and pulmonary infiltrates seen on CXR.  EXAM: CT CHEST, ABDOMEN, AND PELVIS WITH CONTRAST  TECHNIQUE: Multidetector CT imaging of the chest, abdomen and pelvis was performed following the standard protocol during bolus administration of intravenous contrast.  CONTRAST:  OMNIPAQUE IOHEXOL 300 MG/ML  SOLN  COMPARISON:  None.  FINDINGS: CT CHEST FINDINGS  Mediastinum/Nodes: Mild cardiac enlargement. No pericardial effusion. Patulous  esophagus containing oral contrast.  No significant hilar or mediastinal adenopathy. Thoracic aorta shows mild calcification.  Lungs/Pleura: Study suffers from mild motion artifact. Mild dependent infiltrate bilaterally likely atelectasis. Mild atelectasis inferior right middle lobe and inferior lingula. Trace pleural effusions.  Musculoskeletal: Deformity surgical neck right humerus partially visualized without acute fracture line. Correlate for history of prior trauma. Compression deformities throughout the thoracic spine ranging from mild 2 moderate severity. No retropulsion causing significant canal narrowing.  CT ABDOMEN PELVIS FINDINGS  Hepatobiliary: 1.5 cm cyst lateral left lobe of liver. Trace pneumobilia left lobe of liver. Minimal thickening of gallbladder wall with gallbladder wall enhancement. No gallstones identified. Common bile duct normal at 6 mm.  Pancreas: Normal  Spleen: Normal  Adrenals/Urinary Tract: Mild dilatation right ureter. Moderate dilatation left ureter. Bilateral dilatation of the renal pelvises. Normal bilateral renal enhancement. Exophytic 1.8 cm lesion midpole left kidney average attenuation of 27 possibly a complicated cyst. Mild right renal vascular calcification. Foley balloon catheter noted. Severe bladder wall thickening diffusely. A few foci of air are seen near the dome of the bladder.  Stomach/Bowel: Status post gastrojejunostomy. Dehiscent midline abdominal wall musculature. Numerous loops of small and large bowel are involved with this.Many loops show narrowing at the hernia site, but there is no surrounding inflammatory change or wall edema, nor pneumatosis, to indicate strangulation. There is a tiny focus of gas in the anterior abdominal wall to the left of the hernia, image 95, which could be related to recent medication injection, and is not clearly related to the hernia other than by proximity.  Vascular/Lymphatic: Atherosclerotic calcification abdominal aorta.  Minimal aneurysmal dilatation infrarenal aorta to 3 cm. Mild iliac artery dilatation to 17 mm on the left and 15 mm on the right.  Reproductive: Calcification within the prostate  Other: No ascites  Musculoskeletal: Status post kyphoplasty L1, L2 and L3 with compression deformities at these levels. Mild L4 compression deformity as well. Medullary nail right femur.  Evidence of prior a dynamic hip screw left femur. Severe diffuse osteopenia.  IMPRESSION: 1. Dependent infiltrate right lower lobe, more likely due to atelectasis rather than pneumonia. Follow up recommended.  2.Trace left hepatic pneumobilia, etiology uncertain. No visualized pneumatosis involving bowel.  3. Left renal lesion may be complex cyst but mass not excluded. Consider renal ultrasound.  4. Prior gastrojejunostomy. Numerous loops of small and large bowel involved with anterior abdominal wall hernia. No evidence of obstruction or strangulation. Tiny focus of subcutaneous air to left of hernia may be a medication injection site.  5. Severe bladder wall thickening causing mild to moderate hydronephrosis; differential includes hypertrophy, malignancy, and cystitis  6. Mild abdominal aortic aneurysm. Recommend followup by ultrasound in 3 years. This recommendation follows ACR consensus guidelines: White Paper of the ACR Incidental Findings Committee II on Vascular Findings. J Am Coll Radiol 2013; 46:962-952  7. Severe diffuse osteoporosis with numerous spinal compression deformities, status post kyphoplasty at multiple levels, and evidence of prior trauma to femurs and right humerus.  8. Minimal thickening and enhancement of the gallbladder wall, may be incidental. Consider right upper quadrant ultrasound if symptomatic.  9.  Other nonacute findings as described above.   Electronically Signed   By: Esperanza Heir M.D.   On: 11/09/2014 16:39   Ct Abdomen Pelvis W Contrast  11/09/2014   CLINICAL DATA:  acute respiratory failure, hypoxia and pulmonary  infiltrates seen on CXR.  EXAM: CT CHEST, ABDOMEN, AND PELVIS WITH CONTRAST  TECHNIQUE: Multidetector CT imaging of the chest, abdomen and pelvis was performed following the standard protocol during bolus administration of intravenous contrast.  CONTRAST:  OMNIPAQUE IOHEXOL 300 MG/ML  SOLN  COMPARISON:  None.  FINDINGS: CT CHEST FINDINGS  Mediastinum/Nodes: Mild cardiac enlargement. No pericardial effusion. Patulous esophagus containing oral contrast.  No significant hilar or mediastinal adenopathy. Thoracic aorta shows mild calcification.  Lungs/Pleura: Study suffers from mild motion artifact. Mild dependent infiltrate bilaterally likely atelectasis. Mild atelectasis inferior right middle lobe and inferior lingula. Trace pleural effusions.  Musculoskeletal: Deformity surgical neck right humerus partially visualized without acute fracture line. Correlate for history of prior trauma. Compression deformities throughout the thoracic spine ranging from mild 2 moderate severity. No retropulsion causing significant canal narrowing.  CT ABDOMEN PELVIS FINDINGS  Hepatobiliary: 1.5 cm cyst lateral left lobe of liver. Trace pneumobilia left lobe of liver. Minimal thickening of gallbladder wall with gallbladder wall enhancement. No gallstones identified. Common bile duct normal at 6 mm.  Pancreas: Normal  Spleen: Normal  Adrenals/Urinary Tract: Mild dilatation right ureter. Moderate dilatation left ureter. Bilateral dilatation of the renal pelvises. Normal bilateral renal enhancement. Exophytic 1.8 cm lesion midpole left kidney average attenuation of 27 possibly a complicated cyst. Mild right renal vascular calcification. Foley balloon catheter noted. Severe bladder wall thickening diffusely. A few foci of air are seen near the dome of the bladder.  Stomach/Bowel: Status post gastrojejunostomy. Dehiscent midline abdominal wall musculature. Numerous loops of small and large bowel are involved with this.Many loops show  narrowing at the hernia site, but there is no surrounding inflammatory change or wall edema, nor pneumatosis, to indicate strangulation. There is a tiny focus of gas in the anterior abdominal wall to the left of the hernia, image 95, which could be related to recent medication injection, and is not clearly related to the hernia other than by proximity.  Vascular/Lymphatic: Atherosclerotic calcification abdominal aorta. Minimal aneurysmal dilatation infrarenal aorta to 3 cm. Mild iliac artery dilatation to 17 mm  on the left and 15 mm on the right.  Reproductive: Calcification within the prostate  Other: No ascites  Musculoskeletal: Status post kyphoplasty L1, L2 and L3 with compression deformities at these levels. Mild L4 compression deformity as well. Medullary nail right femur. Evidence of prior a dynamic hip screw left femur. Severe diffuse osteopenia.  IMPRESSION: 1. Dependent infiltrate right lower lobe, more likely due to atelectasis rather than pneumonia. Follow up recommended.  2.Trace left hepatic pneumobilia, etiology uncertain. No visualized pneumatosis involving bowel.  3. Left renal lesion may be complex cyst but mass not excluded. Consider renal ultrasound.  4. Prior gastrojejunostomy. Numerous loops of small and large bowel involved with anterior abdominal wall hernia. No evidence of obstruction or strangulation. Tiny focus of subcutaneous air to left of hernia may be a medication injection site.  5. Severe bladder wall thickening causing mild to moderate hydronephrosis; differential includes hypertrophy, malignancy, and cystitis  6. Mild abdominal aortic aneurysm. Recommend followup by ultrasound in 3 years. This recommendation follows ACR consensus guidelines: White Paper of the ACR Incidental Findings Committee II on Vascular Findings. J Am Coll Radiol 2013; 16:109-604  7. Severe diffuse osteoporosis with numerous spinal compression deformities, status post kyphoplasty at multiple levels, and  evidence of prior trauma to femurs and right humerus.  8. Minimal thickening and enhancement of the gallbladder wall, may be incidental. Consider right upper quadrant ultrasound if symptomatic.  9.  Other nonacute findings as described above.   Electronically Signed   By: Esperanza Heir M.D.   On: 11/09/2014 16:39   Dg Chest Port 1 View  11/08/2014   CLINICAL DATA:  Patient with hypoxia.  EXAM: PORTABLE CHEST - 1 VIEW  COMPARISON:  Chest radiograph 09/17/2005  FINDINGS: Monitoring leads overlie the patient. Stable enlarged cardiac and mediastinal contours. Tortuosity of the thoracic aorta. Elevation right hemidiaphragm. No consolidative pulmonary opacities. Chronic bilateral predominantly lower lung interstitial opacities, likely represent scarring. Abnormal appearance to the proximal humerus/humeral head, incompletely visualized.  IMPRESSION: Abnormal appearance to the proximal humerus/humeral which is incompletely visualized. Recommend dedicated views as this may potentially represent degenerative changes, abnormal appearance from positioning, old fracture or acute injury.  No acute cardiopulmonary process.   Electronically Signed   By: Annia Belt M.D.   On: 11/08/2014 11:30    Scheduled Meds: . ampicillin-sulbactam (UNASYN) IV  1.5 g Intravenous Q6H  . aspirin  324 mg Oral Once  . aspirin EC  325 mg Oral Daily  . azithromycin  500 mg Intravenous Q24H  . budesonide (PULMICORT) nebulizer solution  0.25 mg Nebulization BID  . gi cocktail  30 mL Oral Once  . levalbuterol  1.25 mg Nebulization TID  . methylPREDNISolone (SOLU-MEDROL) injection  60 mg Intravenous BID  . metoprolol  5 mg Intravenous Once  . metoprolol tartrate  12.5 mg Oral BID  . pantoprazole (PROTONIX) IV  40 mg Intravenous BID  . pneumococcal 23 valent vaccine  0.5 mL Intramuscular Tomorrow-1000  . tamsulosin  0.4 mg Oral Daily   Continuous Infusions: . dextrose 5 % and 0.45 % NaCl with KCl 10 mEq/L 100 mL/hr at 11/10/14 0037     Principal Problem:   Right hip pain Active Problems:   Acute urinary retention   Acute respiratory failure with hypoxia   Fall   Hypoxia   Nausea and vomiting   CAP (community acquired pneumonia)   COPD with acute exacerbation   Abdominal pain, epigastric   Atrial fibrillation, new onset   Atrial fibrillation  with RVR   Chest pain   RUQ pain   Renal cyst, right   AAA (abdominal aortic aneurysm) without rupture   Ventral hernia   BPH (benign prostatic hyperplasia)    Time spent: 30 min    Jahmia Berrett, [facility]  [facility] Pager [phone]. If 7PM-7AM, please contact night-coverage at [email or web address], password [facility] 11/10/2014, 11:03 AM  LOS: 1 day

## 2014-11-10 NOTE — Progress Notes (Addendum)
Cardiology requests that if patient have HIDA scan it be deferred until Monday because he would like to obtain a NM stress test Saturday morning.  I see that the surgical PA is suggesting RUQ [place] in place of HIDA and will be discussed with attending soon.  I will await recommendations.

## 2014-11-10 NOTE — Progress Notes (Signed)
OT Cancellation Note  Patient Details Name: Neil Terry MRN: [id number] DOB: [birth year]   Cancelled Treatment:    Reason Eval/Treat Not Completed: Medical issues which prohibited therapy. Pt with tachycardia per RN.  RN requesting to hold. Will continue to follow.  Evern Bio 11/10/2014, 10:58 AM  [phone]

## 2014-11-10 NOTE — Progress Notes (Signed)
Blood pressure remains borderline low to hypotensive.  Stop metoprolol.  Will give one dose of digoxin pending cardiology evaluation.  IV access remains an issue.  Awaiting PICC line since hard stick and will require frequent blood draws.

## 2014-11-10 NOTE — Consult Note (Signed)
[facility] Consult: 3:31 PM 11/10/2014  LOS: 1 day    Referring Provider: Dr Malachi Bonds  Primary Care Physician:   Primary Gastroenterologist:  none    Reason for Consultation:  Abdominal pain    HPI: Neil Terry is a 81 y.o. male.  Hx COPD, gastrojejunostomy for perforated ulcer in [place] 3 yrs ago. ? Subsequent ventral hernia repair.    Pt is generally ambulaorty and able to climb stirs to his bedroom on second floor.  Lives between 4 sons, some in [place] and some in [place] area.  Chronic epigastric pain, somewhat intermittent since the gastric surgery.  On daily PPI.  No pain meds or NSAIDS. Will awaken him at night, not worse with po. Stays on PPI but occasional heartburn.  No nausea Great appetite and no weight loss. Daily BM.  No blood in stool.  Has incisional hernia which a surgeon, ? Dr Forestine Chute, in [place] has said he would not repair. Had unremarkable EGD within last 8 to 12 months in [place]. . Colonoscopy was done in [place] > 3 yrs ago.  .    Admitted yesterday after falling ( no syncope, or dizziness), gave hx of the epig pain.  CT shows pneumobilia in left lobe of liver, minimal GB wall thickening, left liver cyst. 6 mm CBD.  No pneumatosis. Previous gastrojejunostomy. R lower lobe infiltrate. Thick urinary bladder. Mild AAA LFTs normal. Not anemic but Hgb 16 on admit and 13.2, 3 days later.  + urinary RBCs.   Transfer today to 3 west due to rapid afib rate in 130s. This resolved.   Speech bedside swallow study today shows no s/s of aspiration.  Ok for solids and thin liquids.   Surgery has recommended HIDA but unable to do this over weekend.  They ordered ultrasound.   Also has nuc cardiac stress test tomorrow.     Past Medical History  Diagnosis Date  . Duodenal ulcer   . BPH  (benign prostatic hyperplasia)   . COPD (chronic obstructive pulmonary disease)   . GERD (gastroesophageal reflux disease)   . HTN (hypertension)     Past Surgical History  Procedure Laterality Date  . Joint replacement    . Transurethral resection of prostate    . Ventral hernia repair    . Gastrojejunostomy      for a perf ulcer in 2013-14    Prior to Admission medications   Medication Sig Start Date End Date Taking? Authorizing Provider  tobramycin-dexamethasone [facility]) ophthalmic solution Place 1 drop into both eyes every 4 (four) hours while awake.   Yes Historical Provider, MD  Pt takes a lot of meds, but family did not bring.  Plan is to bring tomorrow AM sot staff can record.   Scheduled Meds: . ampicillin-sulbactam (UNASYN) IV  1.5 g Intravenous Q6H  . [START ON 11/11/2014] aspirin EC  325 mg Oral Daily  . azithromycin  500 mg Intravenous Q24H  . budesonide (PULMICORT) nebulizer solution  0.25 mg Nebulization BID  . levalbuterol  1.25 mg Nebulization TID  .  methylPREDNISolone (SOLU-MEDROL) injection  60 mg Intravenous BID  . metoprolol tartrate  12.5 mg Oral BID  . pantoprazole (PROTONIX) IV  40 mg Intravenous BID  . pneumococcal 23 valent vaccine  0.5 mL Intramuscular Tomorrow-1000  . tamsulosin  0.4 mg Oral Daily   Infusions: . dextrose 5 % and 0.45 % NaCl with KCl 10 mEq/L 100 mL/hr (11/10/14 1141)  . heparin 1,050 Units/hr (11/10/14 1131)   PRN Meds: iohexol, morphine injection, nitroGLYCERIN, ondansetron (ZOFRAN) IV   Allergies as of 11/07/2014  . (No Known Allergies)    Family History  Problem Relation Age of Onset  . Arrhythmia Son   . Stroke Brother     Social History   Social History  . Marital Status: Married    Spouse Name: N/A  . Number of Children: N/A  . Years of Education: N/A   Occupational History  . Not on file.   Social History Main Topics  . Smoking status: Former Games developer  . Smokeless tobacco: Not on file  . Alcohol Use: No   . Drug Use: No  . Sexual Activity: Not on file   Other Topics Concern  . Not on file   Social History Narrative    REVIEW OF SYSTEMS: Constitutional:  Per HPI ENT:  No nose bleeds Pulm:  + sob, improved CV:  No palpitations, no LE edema.  GU:  No hematuria, no frequency GI:  Per HPI Heme:  No issues with excessive bleeding or bruising.    Transfusions:  none Neuro:  No headaches, no peripheral tingling or numbness Derm:  No itching, no rash or sores.  Endocrine:  No sweats or chills.  No polyuria or dysuria Immunization:  Not queried Travel:  None beyond local counties in last few months.    PHYSICAL EXAM: Vital signs in last 24 hours: Filed Vitals:   11/10/14 1359  BP: 107/57  Pulse: 78  Temp: 98.2 F (36.8 C)  Resp:    Wt Readings from Last 3 Encounters:  11/10/14 160 lb 4.4 oz (72.7 kg)    General: Pleasant, frail, elderly Head:  No swelling, no trauma  Eyes:  No icterus or pallor.  Post cataract surgery appearance to pupils.  Ears:  Not HOH  Nose:  No discharge Mouth:  Clear, moist, no sores Neck:  No mass, no TMG, no JVD Lungs:  Crackles left base, diminished throughout but more so on right.  Heart: RRR.  No mrg Abdomen:  Soft, NT, several incisional, ventral hernias.   Rectal: soft, brown, FOBT negative stool.  No mass   Musc/Skeltl: no joint deformity, ? Slight swelling but no bruising in left knee.  No bruises on left hip Extremities:  No CCE, feet deforemed with arch collapse. Pressure bandages on both heels  Neurologic:  Appropriate moves all 4.  No tremor.  Skin:  No rash, no telangectasia Psych:  Calm, plaeasant  LAB RESULTS:  Recent Labs  11/07/14 1945 11/09/14 0530 11/10/14 0520  WBC 15.1* 9.1 9.1  HGB 16.1 12.9* 13.2  HCT 49.1 41.1 40.0  PLT 225 176 173   BMET Lab Results  Component Value Date   NA 134* 11/10/2014   NA 133* 11/09/2014   NA 135 11/07/2014   K 4.5 11/10/2014   K 4.2 11/09/2014   K 3.8 11/07/2014   CL 99*  11/10/2014   CL 96* 11/09/2014   CL 98* 11/07/2014   CO2 29 11/10/2014   CO2 32 11/09/2014   CO2 25 11/07/2014  GLUCOSE 185* 11/10/2014   GLUCOSE 95 11/09/2014   GLUCOSE 85 11/07/2014   BUN 10 11/10/2014   BUN 19 11/09/2014   BUN 13 11/07/2014   CREATININE 0.81 11/10/2014   CREATININE 1.16 11/09/2014   CREATININE 0.95 11/07/2014   CALCIUM 8.5* 11/10/2014   CALCIUM 8.2* 11/09/2014   CALCIUM 9.1 11/07/2014   LFT  Recent Labs  11/07/14 1945  PROT 8.2*  ALBUMIN 4.1  AST 26  ALT 14*  ALKPHOS 89  BILITOT 0.9   PT/INR No results found for: INR, PROTIME Hepatitis Panel No results for input(s): HEPBSAG, HCVAB, HEPAIGM, HEPBIGM in the last 72 hours. C-Diff No components found for: CDIFF Lipase     Component Value Date/Time   LIPASE 17* 11/08/2014 1110    Drugs of Abuse  No results found for: LABOPIA, COCAINSCRNUR, LABBENZ, AMPHETMU, THCU, LABBARB   RADIOLOGY STUDIES: Ct Chest W Contrast  11/09/2014   CLINICAL DATA:  acute respiratory failure, hypoxia and pulmonary infiltrates seen on CXR.  EXAM: CT CHEST, ABDOMEN, AND PELVIS WITH CONTRAST  TECHNIQUE: Multidetector CT imaging of the chest, abdomen and pelvis was performed following the standard protocol during bolus administration of intravenous contrast.  CONTRAST:  OMNIPAQUE IOHEXOL 300 MG/ML  SOLN  COMPARISON:  None.  FINDINGS: CT CHEST FINDINGS  Mediastinum/Nodes: Mild cardiac enlargement. No pericardial effusion. Patulous esophagus containing oral contrast.  No significant hilar or mediastinal adenopathy. Thoracic aorta shows mild calcification.  Lungs/Pleura: Study suffers from mild motion artifact. Mild dependent infiltrate bilaterally likely atelectasis. Mild atelectasis inferior right middle lobe and inferior lingula. Trace pleural effusions.  Musculoskeletal: Deformity surgical neck right humerus partially visualized without acute fracture line. Correlate for history of prior trauma. Compression deformities  throughout the thoracic spine ranging from mild 2 moderate severity. No retropulsion causing significant canal narrowing.  CT ABDOMEN PELVIS FINDINGS  Hepatobiliary: 1.5 cm cyst lateral left lobe of liver. Trace pneumobilia left lobe of liver. Minimal thickening of gallbladder wall with gallbladder wall enhancement. No gallstones identified. Common bile duct normal at 6 mm.  Pancreas: Normal  Spleen: Normal  Adrenals/Urinary Tract: Mild dilatation right ureter. Moderate dilatation left ureter. Bilateral dilatation of the renal pelvises. Normal bilateral renal enhancement. Exophytic 1.8 cm lesion midpole left kidney average attenuation of 27 possibly a complicated cyst. Mild right renal vascular calcification. Foley balloon catheter noted. Severe bladder wall thickening diffusely. A few foci of air are seen near the dome of the bladder.  Stomach/Bowel: Status post gastrojejunostomy. Dehiscent midline abdominal wall musculature. Numerous loops of small and large bowel are involved with this.Many loops show narrowing at the hernia site, but there is no surrounding inflammatory change or wall edema, nor pneumatosis, to indicate strangulation. There is a tiny focus of gas in the anterior abdominal wall to the left of the hernia, image 95, which could be related to recent medication injection, and is not clearly related to the hernia other than by proximity.  Vascular/Lymphatic: Atherosclerotic calcification abdominal aorta. Minimal aneurysmal dilatation infrarenal aorta to 3 cm. Mild iliac artery dilatation to 17 mm on the left and 15 mm on the right.  Reproductive: Calcification within the prostate  Other: No ascites  Musculoskeletal: Status post kyphoplasty L1, L2 and L3 with compression deformities at these levels. Mild L4 compression deformity as well. Medullary nail right femur. Evidence of prior a dynamic hip screw left femur. Severe diffuse osteopenia.  IMPRESSION: 1. Dependent infiltrate right lower lobe, more  likely due to atelectasis rather than pneumonia. Follow up recommended.  2.Trace left hepatic pneumobilia, etiology uncertain. No visualized pneumatosis involving bowel.  3. Left renal lesion may be complex cyst but mass not excluded. Consider renal ultrasound.  4. Prior gastrojejunostomy. Numerous loops of small and large bowel involved with anterior abdominal wall hernia. No evidence of obstruction or strangulation. Tiny focus of subcutaneous air to left of hernia may be a medication injection site.  5. Severe bladder wall thickening causing mild to moderate hydronephrosis; differential includes hypertrophy, malignancy, and cystitis  6. Mild abdominal aortic aneurysm. Recommend followup by ultrasound in 3 years. This recommendation follows ACR consensus guidelines: White Paper of the ACR Incidental Findings Committee II on Vascular Findings. J Am Coll Radiol 2013; 96:045-409  7. Severe diffuse osteoporosis with numerous spinal compression deformities, status post kyphoplasty at multiple levels, and evidence of prior trauma to femurs and right humerus.  8. Minimal thickening and enhancement of the gallbladder wall, may be incidental. Consider right upper quadrant ultrasound if symptomatic.  9.  Other nonacute findings as described above.   Electronically Signed   By: Esperanza Heir M.D.   On: 11/09/2014 16:39   Ct Abdomen Pelvis W Contrast  11/09/2014   CLINICAL DATA:  acute respiratory failure, hypoxia and pulmonary infiltrates seen on CXR.  EXAM: CT CHEST, ABDOMEN, AND PELVIS WITH CONTRAST  TECHNIQUE: Multidetector CT imaging of the chest, abdomen and pelvis was performed following the standard protocol during bolus administration of intravenous contrast.  CONTRAST:  OMNIPAQUE IOHEXOL 300 MG/ML  SOLN  COMPARISON:  None.  FINDINGS: CT CHEST FINDINGS  Mediastinum/Nodes: Mild cardiac enlargement. No pericardial effusion. Patulous esophagus containing oral contrast.  No significant hilar or mediastinal  adenopathy. Thoracic aorta shows mild calcification.  Lungs/Pleura: Study suffers from mild motion artifact. Mild dependent infiltrate bilaterally likely atelectasis. Mild atelectasis inferior right middle lobe and inferior lingula. Trace pleural effusions.  Musculoskeletal: Deformity surgical neck right humerus partially visualized without acute fracture line. Correlate for history of prior trauma. Compression deformities throughout the thoracic spine ranging from mild 2 moderate severity. No retropulsion causing significant canal narrowing.  CT ABDOMEN PELVIS FINDINGS  Hepatobiliary: 1.5 cm cyst lateral left lobe of liver. Trace pneumobilia left lobe of liver. Minimal thickening of gallbladder wall with gallbladder wall enhancement. No gallstones identified. Common bile duct normal at 6 mm.  Pancreas: Normal  Spleen: Normal  Adrenals/Urinary Tract: Mild dilatation right ureter. Moderate dilatation left ureter. Bilateral dilatation of the renal pelvises. Normal bilateral renal enhancement. Exophytic 1.8 cm lesion midpole left kidney average attenuation of 27 possibly a complicated cyst. Mild right renal vascular calcification. Foley balloon catheter noted. Severe bladder wall thickening diffusely. A few foci of air are seen near the dome of the bladder.  Stomach/Bowel: Status post gastrojejunostomy. Dehiscent midline abdominal wall musculature. Numerous loops of small and large bowel are involved with this.Many loops show narrowing at the hernia site, but there is no surrounding inflammatory change or wall edema, nor pneumatosis, to indicate strangulation. There is a tiny focus of gas in the anterior abdominal wall to the left of the hernia, image 95, which could be related to recent medication injection, and is not clearly related to the hernia other than by proximity.  Vascular/Lymphatic: Atherosclerotic calcification abdominal aorta. Minimal aneurysmal dilatation infrarenal aorta to 3 cm. Mild iliac artery  dilatation to 17 mm on the left and 15 mm on the right.  Reproductive: Calcification within the prostate  Other: No ascites  Musculoskeletal: Status post kyphoplasty L1, L2 and L3 with compression deformities at  these levels. Mild L4 compression deformity as well. Medullary nail right femur. Evidence of prior a dynamic hip screw left femur. Severe diffuse osteopenia.  IMPRESSION: 1. Dependent infiltrate right lower lobe, more likely due to atelectasis rather than pneumonia. Follow up recommended.  2.Trace left hepatic pneumobilia, etiology uncertain. No visualized pneumatosis involving bowel.  3. Left renal lesion may be complex cyst but mass not excluded. Consider renal ultrasound.  4. Prior gastrojejunostomy. Numerous loops of small and large bowel involved with anterior abdominal wall hernia. No evidence of obstruction or strangulation. Tiny focus of subcutaneous air to left of hernia may be a medication injection site.  5. Severe bladder wall thickening causing mild to moderate hydronephrosis; differential includes hypertrophy, malignancy, and cystitis  6. Mild abdominal aortic aneurysm. Recommend followup by ultrasound in 3 years. This recommendation follows ACR consensus guidelines: White Paper of the ACR Incidental Findings Committee II on Vascular Findings. J Am Coll Radiol 2013; 11:914-782  7. Severe diffuse osteoporosis with numerous spinal compression deformities, status post kyphoplasty at multiple levels, and evidence of prior trauma to femurs and right humerus.  8. Minimal thickening and enhancement of the gallbladder wall, may be incidental. Consider right upper quadrant ultrasound if symptomatic.  9.  Other nonacute findings as described above.   Electronically Signed   By: Esperanza Heir M.D.   On: 11/09/2014 16:39    ENDOSCOPIC STUDIES: Per HPI  IMPRESSION:   *  Chronic epigastric pain in pt s/p gastrojejunostomy on daily PPI. CT with pneumobilia in left liver.  LFTs normal.  GB intact.    Ultrasound abdomen ordered for tomorrow.   *  Pulmonary infiltrate, hx COPD. . On unasyn, zitromax. . On Solumedrol   *  Rapid a fib.  Resolved.  On Heparin GTT.  nuc stress study tomorrow.    *  Fall.  Landed on left hip/knee: no trauma per xray.     PLAN:     *  Heart healthy diet tonight.  Npo for ultrasound after midnight. Once daily oral PPI.    Jennye Moccasin  11/10/2014, 3:31 PM Pager: [phone]    ________________________________________________________________________  [facility] MD note:  I personally examined the patient, reviewed the data and agree with the assessment and plan described above. Language barrier exists.  He clearly has had chronic abdominal pains, not sure if they've been worse since admitted after fall, injury to hip. Narcotics seem to have cause urinary issues and could contribute to worsening of GI distress as well.  CT scan pretty underwhelming except ? Pneumobilia.  I agree with [place] evaluation of GB and ongoing cardiac testing for rapid afib.  He's had EGD in the past 8 months elsewhere, seems that it was normal.  Probably will not need invasive GI testing this admission unless significant worsening of his symptoms or clear lab/imaging issues.   Rob Bunting, MD [facility] Pager [phone]

## 2014-11-11 ENCOUNTER — Inpatient Hospital Stay (HOSPITAL_COMMUNITY): Payer: Medicare Other

## 2014-11-11 DIAGNOSIS — M25551 Pain in right hip: Principal | ICD-10-CM

## 2014-11-11 DIAGNOSIS — I509 Heart failure, unspecified: Secondary | ICD-10-CM

## 2014-11-11 LAB — BASIC METABOLIC PANEL
ANION GAP: 5 (ref 5–15)
BUN: 12 mg/dL (ref 6–20)
CHLORIDE: 100 mmol/L — AB (ref 101–111)
CO2: 29 mmol/L (ref 22–32)
CREATININE: 0.91 mg/dL (ref 0.61–1.24)
Calcium: 8.1 mg/dL — ABNORMAL LOW (ref 8.9–10.3)
GFR calc non Af Amer: >60 mL/min (ref >60)
Glucose, Bld: 148 mg/dL — ABNORMAL HIGH (ref 65–99)
POTASSIUM: 4.1 mmol/L (ref 3.5–5.1)
SODIUM: 134 mmol/L — AB (ref 135–145)

## 2014-11-11 LAB — CBC
HCT: 38 % — ABNORMAL LOW (ref 39.0–52.0)
HEMOGLOBIN: 12.5 g/dL — AB (ref 13.0–17.0)
MCH: 29.4 pg (ref 26.0–34.0)
MCHC: 32.9 g/dL (ref 30.0–36.0)
MCV: 89.4 fL (ref 78.0–100.0)
PLATELETS: 188 10*3/uL (ref 150–400)
RBC: 4.25 MIL/uL (ref 4.22–5.81)
RDW: 13.6 % (ref 11.5–15.5)
WBC: 13 10*3/uL — AB (ref 4.0–10.5)

## 2014-11-11 LAB — HEPARIN LEVEL (UNFRACTIONATED): Heparin Unfractionated: 0.56 IU/mL (ref 0.30–0.70)

## 2014-11-11 LAB — TROPONIN I: Troponin I: 0.05 ng/mL — ABNORMAL HIGH (ref <0.031)

## 2014-11-11 LAB — HEMOGLOBIN A1C
Hgb A1c MFr Bld: 5.6 % (ref 4.8–5.6)
Mean Plasma Glucose: 114 mg/dL

## 2014-11-11 MED ORDER — OXYCODONE HCL 5 MG PO TABS: 5.0000 mg | ORAL_TABLET | ORAL | Status: DC | PRN

## 2014-11-11 MED ORDER — PREDNISONE 20 MG PO TABS
40.0000 mg | ORAL_TABLET | Freq: Every day | ORAL | Status: DC
  Administered 2014-11-12: 40 mg via ORAL
  Filled 2014-11-11: qty 2

## 2014-11-11 NOTE — Consult Note (Signed)
ANTICOAGULATION CONSULT NOTE - Follow-Up  Pharmacy Consult for heparin Indication: atrial fibrillation  No Known Allergies  Patient Measurements: Height:  (167.6 cm) Weight: 167 lb 3.2 oz (75.841 kg) IBW/kg (Calculated) : 63.8  Vital Signs: Temp: 97.7 F (36.5 C) (09/10 0525) Temp Source: Oral (09/10 0525) BP: 133/78 mmHg (09/10 0525) Pulse Rate: 60 (09/10 0525)  Labs:  Recent Labs  11/09/14 0530 11/10/14 0520 11/10/14 1220 11/10/14 2101 11/10/14 2357 11/11/14 0351  HGB 12.9* 13.2  --   --   --  12.5*  HCT 41.1 40.0  --   --   --  38.0*  PLT 176 173  --   --   --  188  HEPARINUNFRC  --   --   --  0.52  --  0.56  CREATININE 1.16 0.81  --   --   --  0.91  TROPONINI  --   --  <0.03 0.05* 0.05*  --     Estimated Creatinine Clearance: 54.5 mL/min (by C-G formula based on Cr of 0.91).  Assessment: 81 yo male who [facility] to [facility] on [facility]/07[facility]14 PM with right hip pain after a mechanical fall at home. He was started on IV heparin infusion on 9/9 for new onset atrial fibrillation.   The 8 hr heparin level was 0.52, repeat HL remains therapeutic at 0.56 on heparin drip rate 1050 units/hr.   Hgb 12.5 (down from 16.1 3d ago, +Urinary RBCs). No overt bleeding noted. Pt does have hx of perforated ulcer, currently with abd pain d/t hernias.  Goal of Therapy:  Heparin level 0.3-0.7 units/ml Monitor platelets by anticoagulation protocol: Yes   Plan:  Continue IV Heparin at 1050 units/hr Daily HL, CBC Monitor for s/sx of bleeding, watch Hgb closely F/U plans for long-term [facility] following nuclear stress test  [place]a Jansky, PharmD Clinical Pharmacy Resident Pager: [phone] 11/11/2014 8:28 AM

## 2014-11-11 NOTE — Progress Notes (Addendum)
Subjective: No real abd pain  Objective: Vital signs in last 24 hours: Temp:  [97.7 F (36.5 C)-98.6 F (37 C)] 97.7 F (36.5 C) (09/10 0525) Pulse Rate:  [60-101] 60 (09/10 0525) Resp:  [18-20] 20 (09/10 0525) BP: (82-133)/(53-78) 133/78 mmHg (09/10 0525) SpO2:  [94 %-97 %] 97 % (09/10 0525) Weight:  [72.7 kg (160 lb 4.4 oz)-75.841 kg (167 lb 3.2 oz)] 75.841 kg (167 lb 3.2 oz) (09/10 0525) Last BM Date: 11/07/14  Intake/Output from previous day: 09/09 0701 - 09/10 0700 In: 1447 [P.O.:360; I.V.:1037; IV Piggyback:50] Out: 1700 [Urine:1700] Intake/Output this shift:    GI: reducible ventral hernias, no ruq pain  Lab Results:   Recent Labs  11/10/14 0520 11/11/14 0351  WBC 9.1 13.0*  HGB 13.2 12.5*  HCT 40.0 38.0*  PLT 173 188   BMET  Recent Labs  11/10/14 0520 11/11/14 0351  NA 134* 134*  K 4.5 4.1  CL 99* 100*  CO2 29 29  GLUCOSE 185* 148*  BUN 10 12  CREATININE 0.81 0.91  CALCIUM 8.5* 8.1*   PT/INR No results for input(s): LABPROT, INR in the last 72 hours. ABG  Recent Labs  11/09/14 1030  PHART 7.346*  HCO3 29.0*    Studies/Results: Ct Chest W Contrast  11/09/2014   CLINICAL DATA:  acute respiratory failure, hypoxia and pulmonary infiltrates seen on CXR.  EXAM: CT CHEST, ABDOMEN, AND PELVIS WITH CONTRAST  TECHNIQUE: Multidetector CT imaging of the chest, abdomen and pelvis was performed following the standard protocol during bolus administration of intravenous contrast.  CONTRAST:  OMNIPAQUE IOHEXOL 300 MG/ML  SOLN  COMPARISON:  None.  FINDINGS: CT CHEST FINDINGS  Mediastinum/Nodes: Mild cardiac enlargement. No pericardial effusion. Patulous esophagus containing oral contrast.  No significant hilar or mediastinal adenopathy. Thoracic aorta shows mild calcification.  Lungs/Pleura: Study suffers from mild motion artifact. Mild dependent infiltrate bilaterally likely atelectasis. Mild atelectasis inferior right middle lobe and inferior lingula.  Trace pleural effusions.  Musculoskeletal: Deformity surgical neck right humerus partially visualized without acute fracture line. Correlate for history of prior trauma. Compression deformities throughout the thoracic spine ranging from mild 2 moderate severity. No retropulsion causing significant canal narrowing.  CT ABDOMEN PELVIS FINDINGS  Hepatobiliary: 1.5 cm cyst lateral left lobe of liver. Trace pneumobilia left lobe of liver. Minimal thickening of gallbladder wall with gallbladder wall enhancement. No gallstones identified. Common bile duct normal at 6 mm.  Pancreas: Normal  Spleen: Normal  Adrenals/Urinary Tract: Mild dilatation right ureter. Moderate dilatation left ureter. Bilateral dilatation of the renal pelvises. Normal bilateral renal enhancement. Exophytic 1.8 cm lesion midpole left kidney average attenuation of 27 possibly a complicated cyst. Mild right renal vascular calcification. Foley balloon catheter noted. Severe bladder wall thickening diffusely. A few foci of air are seen near the dome of the bladder.  Stomach/Bowel: Status post gastrojejunostomy. Dehiscent midline abdominal wall musculature. Numerous loops of small and large bowel are involved with this.Many loops show narrowing at the hernia site, but there is no surrounding inflammatory change or wall edema, nor pneumatosis, to indicate strangulation. There is a tiny focus of gas in the anterior abdominal wall to the left of the hernia, image 95, which could be related to recent medication injection, and is not clearly related to the hernia other than by proximity.  Vascular/Lymphatic: Atherosclerotic calcification abdominal aorta. Minimal aneurysmal dilatation infrarenal aorta to 3 cm. Mild iliac artery dilatation to 17 mm on the left and 15 mm on the right.  Reproductive:  Calcification within the prostate  Other: No ascites  Musculoskeletal: Status post kyphoplasty L1, L2 and L3 with compression deformities at these levels. Mild L4  compression deformity as well. Medullary nail right femur. Evidence of prior a dynamic hip screw left femur. Severe diffuse osteopenia.  IMPRESSION: 1. Dependent infiltrate right lower lobe, more likely due to atelectasis rather than pneumonia. Follow up recommended.  2.Trace left hepatic pneumobilia, etiology uncertain. No visualized pneumatosis involving bowel.  3. Left renal lesion may be complex cyst but mass not excluded. Consider renal ultrasound.  4. Prior gastrojejunostomy. Numerous loops of small and large bowel involved with anterior abdominal wall hernia. No evidence of obstruction or strangulation. Tiny focus of subcutaneous air to left of hernia may be a medication injection site.  5. Severe bladder wall thickening causing mild to moderate hydronephrosis; differential includes hypertrophy, malignancy, and cystitis  6. Mild abdominal aortic aneurysm. Recommend followup by ultrasound in 3 years. This recommendation follows ACR consensus guidelines: White Paper of the ACR Incidental Findings Committee II on Vascular Findings. J Am Coll Radiol 2013; 16:109-604  7. Severe diffuse osteoporosis with numerous spinal compression deformities, status post kyphoplasty at multiple levels, and evidence of prior trauma to femurs and right humerus.  8. Minimal thickening and enhancement of the gallbladder wall, may be incidental. Consider right upper quadrant ultrasound if symptomatic.  9.  Other nonacute findings as described above.   Electronically Signed   By: Esperanza Heir M.D.   On: 11/09/2014 16:39   Ct Abdomen Pelvis W Contrast  11/09/2014   CLINICAL DATA:  acute respiratory failure, hypoxia and pulmonary infiltrates seen on CXR.  EXAM: CT CHEST, ABDOMEN, AND PELVIS WITH CONTRAST  TECHNIQUE: Multidetector CT imaging of the chest, abdomen and pelvis was performed following the standard protocol during bolus administration of intravenous contrast.  CONTRAST:  OMNIPAQUE IOHEXOL 300 MG/ML  SOLN   COMPARISON:  None.  FINDINGS: CT CHEST FINDINGS  Mediastinum/Nodes: Mild cardiac enlargement. No pericardial effusion. Patulous esophagus containing oral contrast.  No significant hilar or mediastinal adenopathy. Thoracic aorta shows mild calcification.  Lungs/Pleura: Study suffers from mild motion artifact. Mild dependent infiltrate bilaterally likely atelectasis. Mild atelectasis inferior right middle lobe and inferior lingula. Trace pleural effusions.  Musculoskeletal: Deformity surgical neck right humerus partially visualized without acute fracture line. Correlate for history of prior trauma. Compression deformities throughout the thoracic spine ranging from mild 2 moderate severity. No retropulsion causing significant canal narrowing.  CT ABDOMEN PELVIS FINDINGS  Hepatobiliary: 1.5 cm cyst lateral left lobe of liver. Trace pneumobilia left lobe of liver. Minimal thickening of gallbladder wall with gallbladder wall enhancement. No gallstones identified. Common bile duct normal at 6 mm.  Pancreas: Normal  Spleen: Normal  Adrenals/Urinary Tract: Mild dilatation right ureter. Moderate dilatation left ureter. Bilateral dilatation of the renal pelvises. Normal bilateral renal enhancement. Exophytic 1.8 cm lesion midpole left kidney average attenuation of 27 possibly a complicated cyst. Mild right renal vascular calcification. Foley balloon catheter noted. Severe bladder wall thickening diffusely. A few foci of air are seen near the dome of the bladder.  Stomach/Bowel: Status post gastrojejunostomy. Dehiscent midline abdominal wall musculature. Numerous loops of small and large bowel are involved with this.Many loops show narrowing at the hernia site, but there is no surrounding inflammatory change or wall edema, nor pneumatosis, to indicate strangulation. There is a tiny focus of gas in the anterior abdominal wall to the left of the hernia, image 95, which could be related to recent medication injection,  and is not  clearly related to the hernia other than by proximity.  Vascular/Lymphatic: Atherosclerotic calcification abdominal aorta. Minimal aneurysmal dilatation infrarenal aorta to 3 cm. Mild iliac artery dilatation to 17 mm on the left and 15 mm on the right.  Reproductive: Calcification within the prostate  Other: No ascites  Musculoskeletal: Status post kyphoplasty L1, L2 and L3 with compression deformities at these levels. Mild L4 compression deformity as well. Medullary nail right femur. Evidence of prior a dynamic hip screw left femur. Severe diffuse osteopenia.  IMPRESSION: 1. Dependent infiltrate right lower lobe, more likely due to atelectasis rather than pneumonia. Follow up recommended.  2.Trace left hepatic pneumobilia, etiology uncertain. No visualized pneumatosis involving bowel.  3. Left renal lesion may be complex cyst but mass not excluded. Consider renal ultrasound.  4. Prior gastrojejunostomy. Numerous loops of small and large bowel involved with anterior abdominal wall hernia. No evidence of obstruction or strangulation. Tiny focus of subcutaneous air to left of hernia may be a medication injection site.  5. Severe bladder wall thickening causing mild to moderate hydronephrosis; differential includes hypertrophy, malignancy, and cystitis  6. Mild abdominal aortic aneurysm. Recommend followup by ultrasound in 3 years. This recommendation follows ACR consensus guidelines: White Paper of the ACR Incidental Findings Committee II on Vascular Findings. J Am Coll Radiol 2013; 13:244-010  7. Severe diffuse osteoporosis with numerous spinal compression deformities, status post kyphoplasty at multiple levels, and evidence of prior trauma to femurs and right humerus.  8. Minimal thickening and enhancement of the gallbladder wall, may be incidental. Consider right upper quadrant ultrasound if symptomatic.  9.  Other nonacute findings as described above.   Electronically Signed   By: Esperanza Heir M.D.   On:  11/09/2014 16:39   US Abdomen Limited  11/10/2014   CLINICAL DATA:  Acute right upper quadrant abdominal pain.  EXAM: US ABDOMEN LIMITED - RIGHT UPPER QUADRANT  COMPARISON:  None.  FINDINGS: Gallbladder:  No gallstones or wall thickening visualized. No sonographic Murphy sign noted.  Common bile duct:  Diameter: 6.1 mm which is within normal limits.  Liver:  No focal lesion identified. Within normal limits in parenchymal echogenicity.  IMPRESSION: No abnormality seen in the right upper quadrant of the abdomen.   Electronically Signed   By: Lupita Raider, M.D.   On: 11/10/2014 21:30    Anti-infectives: Anti-infectives    Start     Dose/Rate Route Frequency Ordered Stop   11/09/14 1200  ampicillin-sulbactam (UNASYN) 1.5 g in sodium chloride 0.9 % 50 mL IVPB     1.5 g 100 mL/hr over 30 Minutes Intravenous Every 6 hours 11/09/14 1024     11/09/14 1200  azithromycin (ZITHROMAX) 500 mg in dextrose 5 % 250 mL IVPB     500 mg 250 mL/hr over 60 Minutes Intravenous Every 24 hours 11/09/14 1046 11/12/14 1159   11/09/14 1000  doxycycline (VIBRA-TABS) tablet 100 mg  Status:  Discontinued     100 mg Oral Every 12 hours 11/09/14 0943 11/09/14 0955   11/09/14 1000  azithromycin (ZITHROMAX) tablet 500 mg  Status:  Discontinued     500 mg Oral Daily 11/09/14 0955 11/09/14 1046      Assessment/Plan: abd pain, ventral hernias  I agree with Dr Christella Hartigan. [place] normal. No indication for further testing.  Will write for abd binder prn for hernias. Would not repair these. Will sign off, can advance diet as tolerated   Neil Terry 11/11/2014

## 2014-11-11 NOTE — Progress Notes (Signed)
Occupational Therapy Evaluation Patient Details Name: Tovia Kisner MRN: [id number] DOB: [birth year] Today's Date: 11/11/2014    History of Present Illness 81 yo male post fall at home, with R hip pain, complicated by urinary retention, respiratory failure, and A-fib.   Clinical Impression   PTA, pt mod I with ADL and mobility at cane level. Pt currently requires min A for mobility and ADL tasks. Ambulated @ 150 ft 2LO@ with VSS. End of session, O2 96-100 RA. Pt very motivated to return to PLOF. Recommend pt ambulate with staff with RW. Will follow acutely to address established goals. Recommend follow up with HHOT to facilitate return to PLOF.    Follow Up Recommendations  Home health OT;Supervision/Assistance - 24 hour    Equipment Recommendations  3 in 1 bedside comode    Recommendations for Other Services       Precautions / Restrictions Precautions Precautions: Fall Restrictions Weight Bearing Restrictions: Yes RLE Weight Bearing: Weight bearing as tolerated      Mobility Bed Mobility Overal bed mobility: Needs Assistance Bed Mobility: Supine to Sit     Supine to sit: Supervision        Transfers Overall transfer level: Needs assistance Equipment used: Rolling walker (2 wheeled) Transfers: Sit to/from Stand Sit to Stand: Min assist              Balance     Sitting balance-Leahy Scale: Good       Standing balance-Leahy Scale: Poor                              ADL Overall ADL's : Needs assistance/impaired     Grooming: Min guard;Standing   Upper Body Bathing: Set up;Supervision/ safety;Sitting   Lower Body Bathing: Minimal assistance;Sit to/from stand   Upper Body Dressing : Set up;Supervision/safety;Sitting   Lower Body Dressing: Minimal assistance;Sit to/from stand   Toilet Transfer: Minimal assistance;Ambulation;BSC   Toileting- Clothing Manipulation and Hygiene: Minimal assistance;Sit to/from stand       Functional  mobility during ADLs: Minimal assistance;Rolling walker;Cueing for safety General ADL Comments: limited by pain R hip                     Pertinent Vitals/Pain Pain Assessment: 0-10 Pain Score: 6  Pain Location: R hip Pain Descriptors / Indicators: Aching;Discomfort Pain Intervention(s): Limited activity within patient's tolerance;Monitored during session     Hand Dominance     Extremity/Trunk Assessment Upper Extremity Assessment Upper Extremity Assessment: Defer to OT evaluation   Lower Extremity Assessment Lower Extremity Assessment: Defer to PT evaluation RLE Deficits / Details: Pain and weakness   Cervical / Trunk Assessment Cervical / Trunk Assessment: Normal   Communication Communication Communication: Prefers language other than English (Arabic)   Cognition Arousal/Alertness: Awake/alert Behavior During Therapy: WFL for tasks assessed/performed Overall Cognitive Status: Within Functional Limits for tasks assessed                     General Comments       Exercises Exercises: Other exercises Other Exercises Other Exercises: encouraged BUE AROM Other Exercises: encouraged incentive spirometer   Shoulder Instructions      Home Living Family/patient expects to be discharged to:: Private residence Living Arrangements: Children;Other relatives (patient lives with sons ) Available Help at Discharge: Family Type of Home: House Home Access: Stairs to enter     Home Layout: Two level Alternate Level Stairs-Number of Steps:  12   Bathroom Shower/Tub: Producer, television/film/video: Standard Bathroom Accessibility: Yes How Accessible: Accessible via walker Home Equipment: Cane - single point;Walker - 2 wheels   Additional Comments: History of frequent falls, normally uses cane or nothing.      Prior Functioning/Environment Level of Independence: Independent with assistive device(s)        Comments: History of falls    OT Diagnosis:  Generalized weakness;Acute pain   OT Problem List: Decreased strength;Decreased range of motion;Decreased activity tolerance;Impaired balance (sitting and/or standing);Decreased knowledge of use of DME or AE;Cardiopulmonary status limiting activity;Pain   OT Treatment/Interventions: Self-care/ADL training;Therapeutic exercise;Energy conservation;DME and/or AE instruction;Therapeutic activities;Patient/family education;Balance training    OT Goals(Current goals can be found in the care plan section) Acute Rehab OT Goals Patient Stated Goal: to be independent again OT Goal Formulation: With patient Time For Goal Achievement: 11/25/14 Potential to Achieve Goals: Good ADL Goals Pt Will Perform Lower Body Bathing: with supervision;with caregiver independent in assisting;sit to/from stand;with adaptive equipment Pt Will Perform Lower Body Dressing: with supervision;with adaptive equipment;with caregiver independent in assisting;sit to/from stand Pt Will Transfer to Toilet: with supervision;ambulating;bedside commode Pt Will Perform Toileting - Clothing Manipulation and hygiene: sit to/from stand;with modified independence Pt Will Perform Tub/Shower Transfer: with supervision;3 in 1;ambulating;with caregiver independent in assisting;rolling walker  OT Frequency: Min 3X/week   Barriers to D/C:            Co-evaluation              End of Session Equipment Utilized During Treatment: Gait belt;Rolling walker;Oxygen (2 L) Nurse Communication: Mobility status  Activity Tolerance: Patient tolerated treatment well Patient left: in chair;with call bell/phone within reach;with chair alarm set;with family/visitor present   Time: 1750-1818 OT Time Calculation (min): 28 min Charges:  OT General Charges $OT Visit: 1 Procedure OT Treatments $Self Care/Home Management : 8-22 mins G-Codes:    Obadiah Dennard,HILLARY 11-20-2014, 6:37 PM   [facility], OTR/L  [phone] 11-20-2014

## 2014-11-11 NOTE — Progress Notes (Signed)
  Echocardiogram 2D Echocardiogram has been performed.  Janalyn Harder 11/11/2014, 9:07 AM

## 2014-11-11 NOTE — Progress Notes (Signed)
[facility] Progress Note    Since last GI note: With family in room, seems that abd pain is better.  He still has pain in hip. Would like to eat. Currently getting echo.  Objective: Vital signs in last 24 hours: Temp:  [97.7 F (36.5 C)-98.6 F (37 C)] 97.7 F (36.5 C) (09/10 0525) Pulse Rate:  [60-101] 60 (09/10 0525) Resp:  [18-20] 20 (09/10 0525) BP: (82-133)/(53-78) 133/78 mmHg (09/10 0525) SpO2:  [94 %-97 %] 97 % (09/10 0525) Weight:  [160 lb 4.4 oz (72.7 kg)-167 lb 3.2 oz (75.841 kg)] 167 lb 3.2 oz (75.841 kg) (09/10 0525) Last BM Date: 11/07/14 General: alert and oriented times 3 Heart: regular rate and rythm Abdomen: soft, non-tender, non-distended, normal bowel sounds   Lab Results:  Recent Labs  11/09/14 0530 11/10/14 0520 11/11/14 0351  WBC 9.1 9.1 13.0*  HGB 12.9* 13.2 12.5*  PLT 176 173 188  MCV 91.5 89.3 89.4    Recent Labs  11/09/14 0530 11/10/14 0520 11/11/14 0351  NA 133* 134* 134*  K 4.2 4.5 4.1  CL 96* 99* 100*  CO2 32 29 29  GLUCOSE 95 185* 148*  BUN 19 10 12   CREATININE 1.16 0.81 0.91  CALCIUM 8.2* 8.5* 8.1*   Studies/Results: Ct Chest W Contrast  11/09/2014   CLINICAL DATA:  acute respiratory failure, hypoxia and pulmonary infiltrates seen on CXR.  EXAM: CT CHEST, ABDOMEN, AND PELVIS WITH CONTRAST  TECHNIQUE: Multidetector CT imaging of the chest, abdomen and pelvis was performed following the standard protocol during bolus administration of intravenous contrast.  CONTRAST:  OMNIPAQUE IOHEXOL 300 MG/ML  SOLN  COMPARISON:  None.  FINDINGS: CT CHEST FINDINGS  Mediastinum/Nodes: Mild cardiac enlargement. No pericardial effusion. Patulous esophagus containing oral contrast.  No significant hilar or mediastinal adenopathy. Thoracic aorta shows mild calcification.  Lungs/Pleura: Study suffers from mild motion artifact. Mild dependent infiltrate bilaterally likely atelectasis. Mild atelectasis inferior right middle lobe and  inferior lingula. Trace pleural effusions.  Musculoskeletal: Deformity surgical neck right humerus partially visualized without acute fracture line. Correlate for history of prior trauma. Compression deformities throughout the thoracic spine ranging from mild 2 moderate severity. No retropulsion causing significant canal narrowing.  CT ABDOMEN PELVIS FINDINGS  Hepatobiliary: 1.5 cm cyst lateral left lobe of liver. Trace pneumobilia left lobe of liver. Minimal thickening of gallbladder wall with gallbladder wall enhancement. No gallstones identified. Common bile duct normal at 6 mm.  Pancreas: Normal  Spleen: Normal  Adrenals/Urinary Tract: Mild dilatation right ureter. Moderate dilatation left ureter. Bilateral dilatation of the renal pelvises. Normal bilateral renal enhancement. Exophytic 1.8 cm lesion midpole left kidney average attenuation of 27 possibly a complicated cyst. Mild right renal vascular calcification. Foley balloon catheter noted. Severe bladder wall thickening diffusely. A few foci of air are seen near the dome of the bladder.  Stomach/Bowel: Status post gastrojejunostomy. Dehiscent midline abdominal wall musculature. Numerous loops of small and large bowel are involved with this.Many loops show narrowing at the hernia site, but there is no surrounding inflammatory change or wall edema, nor pneumatosis, to indicate strangulation. There is a tiny focus of gas in the anterior abdominal wall to the left of the hernia, image 95, which could be related to recent medication injection, and is not clearly related to the hernia other than by proximity.  Vascular/Lymphatic: Atherosclerotic calcification abdominal aorta. Minimal aneurysmal dilatation infrarenal aorta to 3 cm. Mild iliac artery dilatation to 17 mm on the left and 15 mm on the  right.  Reproductive: Calcification within the prostate  Other: No ascites  Musculoskeletal: Status post kyphoplasty L1, L2 and L3 with compression deformities at these  levels. Mild L4 compression deformity as well. Medullary nail right femur. Evidence of prior a dynamic hip screw left femur. Severe diffuse osteopenia.  IMPRESSION: 1. Dependent infiltrate right lower lobe, more likely due to atelectasis rather than pneumonia. Follow up recommended.  2.Trace left hepatic pneumobilia, etiology uncertain. No visualized pneumatosis involving bowel.  3. Left renal lesion may be complex cyst but mass not excluded. Consider renal ultrasound.  4. Prior gastrojejunostomy. Numerous loops of small and large bowel involved with anterior abdominal wall hernia. No evidence of obstruction or strangulation. Tiny focus of subcutaneous air to left of hernia may be a medication injection site.  5. Severe bladder wall thickening causing mild to moderate hydronephrosis; differential includes hypertrophy, malignancy, and cystitis  6. Mild abdominal aortic aneurysm. Recommend followup by ultrasound in 3 years. This recommendation follows ACR consensus guidelines: White Paper of the ACR Incidental Findings Committee II on Vascular Findings. J Am Coll Radiol 2013; 11:914-782  7. Severe diffuse osteoporosis with numerous spinal compression deformities, status post kyphoplasty at multiple levels, and evidence of prior trauma to femurs and right humerus.  8. Minimal thickening and enhancement of the gallbladder wall, may be incidental. Consider right upper quadrant ultrasound if symptomatic.  9.  Other nonacute findings as described above.   Electronically Signed   By: Esperanza Heir M.D.   On: 11/09/2014 16:39   Ct Abdomen Pelvis W Contrast  11/09/2014   CLINICAL DATA:  acute respiratory failure, hypoxia and pulmonary infiltrates seen on CXR.  EXAM: CT CHEST, ABDOMEN, AND PELVIS WITH CONTRAST  TECHNIQUE: Multidetector CT imaging of the chest, abdomen and pelvis was performed following the standard protocol during bolus administration of intravenous contrast.  CONTRAST:  OMNIPAQUE IOHEXOL 300 MG/ML   SOLN  COMPARISON:  None.  FINDINGS: CT CHEST FINDINGS  Mediastinum/Nodes: Mild cardiac enlargement. No pericardial effusion. Patulous esophagus containing oral contrast.  No significant hilar or mediastinal adenopathy. Thoracic aorta shows mild calcification.  Lungs/Pleura: Study suffers from mild motion artifact. Mild dependent infiltrate bilaterally likely atelectasis. Mild atelectasis inferior right middle lobe and inferior lingula. Trace pleural effusions.  Musculoskeletal: Deformity surgical neck right humerus partially visualized without acute fracture line. Correlate for history of prior trauma. Compression deformities throughout the thoracic spine ranging from mild 2 moderate severity. No retropulsion causing significant canal narrowing.  CT ABDOMEN PELVIS FINDINGS  Hepatobiliary: 1.5 cm cyst lateral left lobe of liver. Trace pneumobilia left lobe of liver. Minimal thickening of gallbladder wall with gallbladder wall enhancement. No gallstones identified. Common bile duct normal at 6 mm.  Pancreas: Normal  Spleen: Normal  Adrenals/Urinary Tract: Mild dilatation right ureter. Moderate dilatation left ureter. Bilateral dilatation of the renal pelvises. Normal bilateral renal enhancement. Exophytic 1.8 cm lesion midpole left kidney average attenuation of 27 possibly a complicated cyst. Mild right renal vascular calcification. Foley balloon catheter noted. Severe bladder wall thickening diffusely. A few foci of air are seen near the dome of the bladder.  Stomach/Bowel: Status post gastrojejunostomy. Dehiscent midline abdominal wall musculature. Numerous loops of small and large bowel are involved with this.Many loops show narrowing at the hernia site, but there is no surrounding inflammatory change or wall edema, nor pneumatosis, to indicate strangulation. There is a tiny focus of gas in the anterior abdominal wall to the left of the hernia, image 95, which could be related to  recent medication injection, and is  not clearly related to the hernia other than by proximity.  Vascular/Lymphatic: Atherosclerotic calcification abdominal aorta. Minimal aneurysmal dilatation infrarenal aorta to 3 cm. Mild iliac artery dilatation to 17 mm on the left and 15 mm on the right.  Reproductive: Calcification within the prostate  Other: No ascites  Musculoskeletal: Status post kyphoplasty L1, L2 and L3 with compression deformities at these levels. Mild L4 compression deformity as well. Medullary nail right femur. Evidence of prior a dynamic hip screw left femur. Severe diffuse osteopenia.  IMPRESSION: 1. Dependent infiltrate right lower lobe, more likely due to atelectasis rather than pneumonia. Follow up recommended.  2.Trace left hepatic pneumobilia, etiology uncertain. No visualized pneumatosis involving bowel.  3. Left renal lesion may be complex cyst but mass not excluded. Consider renal ultrasound.  4. Prior gastrojejunostomy. Numerous loops of small and large bowel involved with anterior abdominal wall hernia. No evidence of obstruction or strangulation. Tiny focus of subcutaneous air to left of hernia may be a medication injection site.  5. Severe bladder wall thickening causing mild to moderate hydronephrosis; differential includes hypertrophy, malignancy, and cystitis  6. Mild abdominal aortic aneurysm. Recommend followup by ultrasound in 3 years. This recommendation follows ACR consensus guidelines: White Paper of the ACR Incidental Findings Committee II on Vascular Findings. J Am Coll Radiol 2013; 16:109-604  7. Severe diffuse osteoporosis with numerous spinal compression deformities, status post kyphoplasty at multiple levels, and evidence of prior trauma to femurs and right humerus.  8. Minimal thickening and enhancement of the gallbladder wall, may be incidental. Consider right upper quadrant ultrasound if symptomatic.  9.  Other nonacute findings as described above.   Electronically Signed   By: Esperanza Heir M.D.   On:  11/09/2014 16:39   US Abdomen Limited  11/10/2014   CLINICAL DATA:  Acute right upper quadrant abdominal pain.  EXAM: US ABDOMEN LIMITED - RIGHT UPPER QUADRANT  COMPARISON:  None.  FINDINGS: Gallbladder:  No gallstones or wall thickening visualized. No sonographic Murphy sign noted.  Common bile duct:  Diameter: 6.1 mm which is within normal limits.  Liver:  No focal lesion identified. Within normal limits in parenchymal echogenicity.  IMPRESSION: No abnormality seen in the right upper quadrant of the abdomen.   Electronically Signed   By: Lupita Raider, M.D.   On: 11/10/2014 21:30     Medications: Scheduled Meds: . ampicillin-sulbactam (UNASYN) IV  1.5 g Intravenous Q6H  . aspirin EC  325 mg Oral Daily  . azithromycin  500 mg Intravenous Q24H  . budesonide (PULMICORT) nebulizer solution  0.25 mg Nebulization BID  . levalbuterol  1.25 mg Nebulization TID  . methylPREDNISolone (SOLU-MEDROL) injection  60 mg Intravenous BID  . metoprolol tartrate  12.5 mg Oral BID  . pantoprazole  40 mg Oral Q0600  . pneumococcal 23 valent vaccine  0.5 mL Intramuscular Tomorrow-1000  . tamsulosin  0.4 mg Oral Daily   Continuous Infusions: . dextrose 5 % and 0.45 % NaCl with KCl 10 mEq/L 100 mL/hr at 11/11/14 0707  . heparin 1,050 Units/hr (11/10/14 1131)   PRN Meds:.iohexol, morphine injection, nitroGLYCERIN, ondansetron (ZOFRAN) IV    Assessment/Plan: 81 y.o. male with chronic abd pains, transiently worse after fall/pain meds  US shows normal GB, biliary tree. LFTs were normal.  His abd pains seem to have improved and don't warrant further testing at this point.  I am suspicious that his GI symptoms flared because of narcotics needed for his hip  pain, similar to his urologic issues after narcotics.  Unclear clinical significance of the "trace pneumobilia in left lobe of liver," but with normal [place] and normal LFTs and improved pain I don't think any workup is necessary.  Please call, page is any further  questions or concerns.  I am going to advance his diet.    Rachael Fee, MD  11/11/2014, 8:36 AM [facility] Pager [phone]

## 2014-11-11 NOTE — Progress Notes (Signed)
[facility] PROGRESS NOTE  Neil Terry ION:[id number] DOB: Jul 22, 1927 DOA: 11/07/2014 PCP: No primary care provider on file.  Brief Summary  Neil Terry is a 81 y.o. male with history of orthopaedic procedure to his R femur. Patient suffered a mechanical fall and landed on his R hip resulting in severe R hip pain, worse with attempt to move the right hip. He was unable to bear weight on the R hip. Surprisingly on X ray there was no hip fracture. His pain persisted so a CT scan was performed which also showed no fracture. He was admitted due to inability to walk.  9/8:  Progressive SOB and hypoxia.  Started on tx for pneumonia, COPD.  Also with increasing abdominal pain.  CT c/a/p ordered.   9/9:  HIDA pending >> had to defer due to acute onset chest pain with a-fib with RVR.  Cards and General Surgery consulted.  Heparin gtt started 9/10:  Stress test deferred to 9/11, feeling better  Assessment/Plan  Right hip pain without obvious fracture on x-ray or CT scan.  Despite limited range of motion of the right hip and leukocytosis, I doubt that he has septic arthritis given the tenderness to palpation over the left lateral hip, and pain with attempts at flexion of the knee.  -  Physical therapy recommending home health physical therapy with 24-hour assistance -  Transition to oral pain medication  Acute hypoxic respiratory failure with evidence of bilateral lower lobe "scarring" on CXR.  Likely a combination of acute COPD exacerbation and RLL pneumonia/aspiration pneumonia.  Markedly improved this morning. -  Discontinue Solumedrol and start prednisone tomorrow -  Continue budesonide and xopenex -  Continue unasyn to complete a seven-day course of antibiotics -  Legionella/s. pneumo never obtained by nursing staff -  HIV NR  Chest pain due to A. fib with RVR, resolved -  Troponins mildly elevated but flat, consistent with demand ischemia secondary to A. fib with RVR  -  Continue  heparin drip for now  -  Pending stress test, a stress test and straight no evidence of reversible ischemia, will plan to start Apixaban  -  Continue aspirin, statin, beta blocker  -  Continue when necessary nitroglycerin   New-onset paroxysmal  a-fib with RVR, likely triggered by pneumonia, possible acute cholecystitis.  CHA2DS2vasc score minimum of 2 based on age.  Awaiting A1c, ECHO.   -  troponins as above -  TSH 0.383, within normal limits  -  ECHO pending -  Continue heparin gtt -   Continue metoprolol 12.5mg  po BID -  Telemetry demonstrates normal sinus rhythm since yesterday with frequent PVCs and occasional sinus bradycardia to the 40s   Nonsustained V-tach, 3 beats -  Potassium and magnesium wnl -  ECHO report pending  RUQ pain Hx of duodenal ulcer and extensive abdominal surgery at [facility].   -  CT abd/pelvis demonstrates possible acute cholecystitis with gallbladder wall thickening and pneumobilia. -  Right upper quadrant ultrasound demonstrates no evidence of acute cholecystitis  -  patient had EGD a proximally 8 months ago that was within normal limits -  Continue PPI -  appreciate GI and general surgery assistance -  Liver function tests and lipase within normal limits -  Urinalysis without obvious infection -  Abdominal binder ordered for comfort  Acute urinary retention likely secondary to narcotics -  Post void residual was greater than 500 mL -  Foley catheter placement -  Minimize narcotics if possible -  Start Flomax -  Voiding trial   Leukocytosis, no evidence of pneumonia on chest x-ray, likely due to steroids  Mild hyponatremia stable and asymptomatic  Left renal cyst, possible complete.  Needs outpatient RUS which can be ordered by his Urologist  AAA, mild and needs repeat US in 11/2017  Bladder wall thickening likely secondary to BPH and chronic retention.  Had TURP last year and has urologist in [place].   -  F/u with Urology  Diet:   NPO Access:  PIV IVF:  yes Proph:  heparin  Code Status: full Family Communication:  Patient alone via interpreter Disposition: To home with home health services pending nuclear medicine stress test on 9/11   Consultants:   Orthopedics  Cardiology   Procedures:   Hip x-ray  CT femur right side  CT pelvis  X-ray femur right  Chest x-ray  Antibiotics:   Unasyn on 9/8   HPI/Subjective:  (used pacific interpreter Arabic)  persistent abdominal pain in the right upper quadrant that is no better or worse than prior. Denies nausea, vomiting. Chest pain has resolved since his episode of A. fib with RVR yesterday. Breathing remains much better. He continues to have pain in the lateral aspect of the upper thigh slightly anteriorly when moving his leg, particularly with hip flexion  Objective: Filed Vitals:   11/10/14 1359 11/10/14 2111 11/11/14 0525 11/11/14 1346  BP: 107/57 131/67 133/78 112/69  Pulse: 78 65 60 87  Temp: 98.2 F (36.8 C) 98.6 F (37 C) 97.7 F (36.5 C) 98.5 F (36.9 C)  TempSrc: Oral Oral Oral Oral  Resp:  Height:      Weight:   75.841 kg (167 lb 3.2 oz)   SpO2: 97% 97% 97% 97%    Intake/Output Summary (Last 24 hours) at 11/11/14 1550 Last data filed at 11/11/14 1350  Gross per 24 hour  Intake 1446.99 ml  Output   2650 ml  Net -1203.01 ml   Filed Weights   11/10/14 1100 11/11/14 0525  Weight: 72.7 kg (160 lb 4.4 oz) 75.841 kg (167 lb 3.2 oz)   Body mass index is 27 kg/(m^2).  Exam:   General:  Adult male, NAD  HEENT:  NCAT, MMM  Cardiovascular:  RRR, nl S1, S2 no mrg, 2+ pulses, warm extremities   Respiratory:  Diminished at the right base, no wheezes, rales or rhonchi  Abdomen:   NABS, soft, ventral hernias which are reducible, tender to palpation in the epigastrium without rebound or guarding   MSK:   Normal tone and bulk, no LEE. Pain with ROM of the right hip, but no swelling  Neuro:  Grossly intact   Data  Reviewed: Basic Metabolic Panel:  Recent Labs Lab 11/07/14 1945 11/09/14 0530 11/10/14 0520 11/10/14 1220 11/11/14 0351  NA 135 133* 134*  --  134*  K 3.8 4.2 4.5  --  4.1  CL 98* 96* 99*  --  100*  CO2 25 32 29  --  29  GLUCOSE 85 95 185*  --  148*  BUN --  12  CREATININE 0.95 1.16 0.81  --  0.91  CALCIUM 9.1 8.2* 8.5*  --  8.1*  MG  --   --   --  2.0  --    Liver Function Tests:  Recent Labs Lab 11/07/14 1945  AST 26  ALT 14*  ALKPHOS 89  BILITOT 0.9  PROT 8.2*  ALBUMIN 4.1  Recent Labs Lab 11/08/14 1110  LIPASE 17*   No results for input(s): AMMONIA in the last 168 hours. CBC:  Recent Labs Lab 11/07/14 1945 11/09/14 0530 11/10/14 0520 11/11/14 0351  WBC 15.1* 9.1 9.1 13.0*  NEUTROABS 13.1*  --   --   --   HGB 16.1 12.9* 13.2 12.5*  HCT 49.1 41.1 40.0 38.0*  MCV 89.6 91.5 89.3 89.4  PLT 225 176 173 188    Recent Results (from the past 240 hour(s))  Culture, Urine     Status: None   Collection Time: 11/08/14  4:08 PM  Result Value Ref Range Status   Specimen Description URINE, RANDOM  Final   Special Requests NONE  Final   Culture NO GROWTH 1 DAY  Final   Report Status 11/09/2014 FINAL  Final  Culture, blood (routine x 2)     Status: None (Preliminary result)   Collection Time: 11/09/14 11:15 AM  Result Value Ref Range Status   Specimen Description BLOOD RIGHT ARM  Final   Special Requests BOTTLES DRAWN AEROBIC AND ANAEROBIC 10CC  Final   Culture NO GROWTH 2 DAYS  Final   Report Status PENDING  Incomplete  Culture, blood (routine x 2)     Status: None (Preliminary result)   Collection Time: 11/09/14 11:25 AM  Result Value Ref Range Status   Specimen Description BLOOD LEFT ARM  Final   Special Requests BOTTLES DRAWN AEROBIC AND ANAEROBIC 5CC  Final   Culture NO GROWTH 2 DAYS  Final   Report Status PENDING  Incomplete     Studies: Ct Chest W Contrast  11/09/2014   CLINICAL DATA:  acute respiratory failure, hypoxia and  pulmonary infiltrates seen on CXR.  EXAM: CT CHEST, ABDOMEN, AND PELVIS WITH CONTRAST  TECHNIQUE: Multidetector CT imaging of the chest, abdomen and pelvis was performed following the standard protocol during bolus administration of intravenous contrast.  CONTRAST:  OMNIPAQUE IOHEXOL 300 MG/ML  SOLN  COMPARISON:  None.  FINDINGS: CT CHEST FINDINGS  Mediastinum/Nodes: Mild cardiac enlargement. No pericardial effusion. Patulous esophagus containing oral contrast.  No significant hilar or mediastinal adenopathy. Thoracic aorta shows mild calcification.  Lungs/Pleura: Study suffers from mild motion artifact. Mild dependent infiltrate bilaterally likely atelectasis. Mild atelectasis inferior right middle lobe and inferior lingula. Trace pleural effusions.  Musculoskeletal: Deformity surgical neck right humerus partially visualized without acute fracture line. Correlate for history of prior trauma. Compression deformities throughout the thoracic spine ranging from mild 2 moderate severity. No retropulsion causing significant canal narrowing.  CT ABDOMEN PELVIS FINDINGS  Hepatobiliary: 1.5 cm cyst lateral left lobe of liver. Trace pneumobilia left lobe of liver. Minimal thickening of gallbladder wall with gallbladder wall enhancement. No gallstones identified. Common bile duct normal at 6 mm.  Pancreas: Normal  Spleen: Normal  Adrenals/Urinary Tract: Mild dilatation right ureter. Moderate dilatation left ureter. Bilateral dilatation of the renal pelvises. Normal bilateral renal enhancement. Exophytic 1.8 cm lesion midpole left kidney average attenuation of 27 possibly a complicated cyst. Mild right renal vascular calcification. Foley balloon catheter noted. Severe bladder wall thickening diffusely. A few foci of air are seen near the dome of the bladder.  Stomach/Bowel: Status post gastrojejunostomy. Dehiscent midline abdominal wall musculature. Numerous loops of small and large bowel are involved with this.Many  loops show narrowing at the hernia site, but there is no surrounding inflammatory change or wall edema, nor pneumatosis, to indicate strangulation. There is a tiny focus of gas in the anterior abdominal wall to  the left of the hernia, image 95, which could be related to recent medication injection, and is not clearly related to the hernia other than by proximity.  Vascular/Lymphatic: Atherosclerotic calcification abdominal aorta. Minimal aneurysmal dilatation infrarenal aorta to 3 cm. Mild iliac artery dilatation to 17 mm on the left and 15 mm on the right.  Reproductive: Calcification within the prostate  Other: No ascites  Musculoskeletal: Status post kyphoplasty L1, L2 and L3 with compression deformities at these levels. Mild L4 compression deformity as well. Medullary nail right femur. Evidence of prior a dynamic hip screw left femur. Severe diffuse osteopenia.  IMPRESSION: 1. Dependent infiltrate right lower lobe, more likely due to atelectasis rather than pneumonia. Follow up recommended.  2.Trace left hepatic pneumobilia, etiology uncertain. No visualized pneumatosis involving bowel.  3. Left renal lesion may be complex cyst but mass not excluded. Consider renal ultrasound.  4. Prior gastrojejunostomy. Numerous loops of small and large bowel involved with anterior abdominal wall hernia. No evidence of obstruction or strangulation. Tiny focus of subcutaneous air to left of hernia may be a medication injection site.  5. Severe bladder wall thickening causing mild to moderate hydronephrosis; differential includes hypertrophy, malignancy, and cystitis  6. Mild abdominal aortic aneurysm. Recommend followup by ultrasound in 3 years. This recommendation follows ACR consensus guidelines: White Paper of the ACR Incidental Findings Committee II on Vascular Findings. J Am Coll Radiol 2013; 16:109-604  7. Severe diffuse osteoporosis with numerous spinal compression deformities, status post kyphoplasty at multiple levels,  and evidence of prior trauma to femurs and right humerus.  8. Minimal thickening and enhancement of the gallbladder wall, may be incidental. Consider right upper quadrant ultrasound if symptomatic.  9.  Other nonacute findings as described above.   Electronically Signed   By: Esperanza Heir M.D.   On: 11/09/2014 16:39   Ct Abdomen Pelvis W Contrast  11/09/2014   CLINICAL DATA:  acute respiratory failure, hypoxia and pulmonary infiltrates seen on CXR.  EXAM: CT CHEST, ABDOMEN, AND PELVIS WITH CONTRAST  TECHNIQUE: Multidetector CT imaging of the chest, abdomen and pelvis was performed following the standard protocol during bolus administration of intravenous contrast.  CONTRAST:  OMNIPAQUE IOHEXOL 300 MG/ML  SOLN  COMPARISON:  None.  FINDINGS: CT CHEST FINDINGS  Mediastinum/Nodes: Mild cardiac enlargement. No pericardial effusion. Patulous esophagus containing oral contrast.  No significant hilar or mediastinal adenopathy. Thoracic aorta shows mild calcification.  Lungs/Pleura: Study suffers from mild motion artifact. Mild dependent infiltrate bilaterally likely atelectasis. Mild atelectasis inferior right middle lobe and inferior lingula. Trace pleural effusions.  Musculoskeletal: Deformity surgical neck right humerus partially visualized without acute fracture line. Correlate for history of prior trauma. Compression deformities throughout the thoracic spine ranging from mild 2 moderate severity. No retropulsion causing significant canal narrowing.  CT ABDOMEN PELVIS FINDINGS  Hepatobiliary: 1.5 cm cyst lateral left lobe of liver. Trace pneumobilia left lobe of liver. Minimal thickening of gallbladder wall with gallbladder wall enhancement. No gallstones identified. Common bile duct normal at 6 mm.  Pancreas: Normal  Spleen: Normal  Adrenals/Urinary Tract: Mild dilatation right ureter. Moderate dilatation left ureter. Bilateral dilatation of the renal pelvises. Normal bilateral renal enhancement. Exophytic  1.8 cm lesion midpole left kidney average attenuation of 27 possibly a complicated cyst. Mild right renal vascular calcification. Foley balloon catheter noted. Severe bladder wall thickening diffusely. A few foci of air are seen near the dome of the bladder.  Stomach/Bowel: Status post gastrojejunostomy. Dehiscent midline abdominal wall musculature. Numerous loops  of small and large bowel are involved with this.Many loops show narrowing at the hernia site, but there is no surrounding inflammatory change or wall edema, nor pneumatosis, to indicate strangulation. There is a tiny focus of gas in the anterior abdominal wall to the left of the hernia, image 95, which could be related to recent medication injection, and is not clearly related to the hernia other than by proximity.  Vascular/Lymphatic: Atherosclerotic calcification abdominal aorta. Minimal aneurysmal dilatation infrarenal aorta to 3 cm. Mild iliac artery dilatation to 17 mm on the left and 15 mm on the right.  Reproductive: Calcification within the prostate  Other: No ascites  Musculoskeletal: Status post kyphoplasty L1, L2 and L3 with compression deformities at these levels. Mild L4 compression deformity as well. Medullary nail right femur. Evidence of prior a dynamic hip screw left femur. Severe diffuse osteopenia.  IMPRESSION: 1. Dependent infiltrate right lower lobe, more likely due to atelectasis rather than pneumonia. Follow up recommended.  2.Trace left hepatic pneumobilia, etiology uncertain. No visualized pneumatosis involving bowel.  3. Left renal lesion may be complex cyst but mass not excluded. Consider renal ultrasound.  4. Prior gastrojejunostomy. Numerous loops of small and large bowel involved with anterior abdominal wall hernia. No evidence of obstruction or strangulation. Tiny focus of subcutaneous air to left of hernia may be a medication injection site.  5. Severe bladder wall thickening causing mild to moderate hydronephrosis;  differential includes hypertrophy, malignancy, and cystitis  6. Mild abdominal aortic aneurysm. Recommend followup by ultrasound in 3 years. This recommendation follows ACR consensus guidelines: White Paper of the ACR Incidental Findings Committee II on Vascular Findings. J Am Coll Radiol 2013; 16:109-604  7. Severe diffuse osteoporosis with numerous spinal compression deformities, status post kyphoplasty at multiple levels, and evidence of prior trauma to femurs and right humerus.  8. Minimal thickening and enhancement of the gallbladder wall, may be incidental. Consider right upper quadrant ultrasound if symptomatic.  9.  Other nonacute findings as described above.   Electronically Signed   By: Esperanza Heir M.D.   On: 11/09/2014 16:39   US Abdomen Limited  11/10/2014   CLINICAL DATA:  Acute right upper quadrant abdominal pain.  EXAM: US ABDOMEN LIMITED - RIGHT UPPER QUADRANT  COMPARISON:  None.  FINDINGS: Gallbladder:  No gallstones or wall thickening visualized. No sonographic Murphy sign noted.  Common bile duct:  Diameter: 6.1 mm which is within normal limits.  Liver:  No focal lesion identified. Within normal limits in parenchymal echogenicity.  IMPRESSION: No abnormality seen in the right upper quadrant of the abdomen.   Electronically Signed   By: Lupita Raider, M.D.   On: 11/10/2014 21:30    Scheduled Meds: . ampicillin-sulbactam (UNASYN) IV  1.5 g Intravenous Q6H  . aspirin EC  325 mg Oral Daily  . budesonide (PULMICORT) nebulizer solution  0.25 mg Nebulization BID  . levalbuterol  1.25 mg Nebulization TID  . methylPREDNISolone (SOLU-MEDROL) injection  60 mg Intravenous BID  . metoprolol tartrate  12.5 mg Oral BID  . pantoprazole  40 mg Oral Q0600  . pneumococcal 23 valent vaccine  0.5 mL Intramuscular Tomorrow-1000  . tamsulosin  0.4 mg Oral Daily   Continuous Infusions: . dextrose 5 % and 0.45 % NaCl with KCl 10 mEq/L 100 mL/hr at 11/11/14 0707  . heparin 1,050 Units/hr (11/11/14  1154)    Principal Problem:   Right hip pain Active Problems:   Acute urinary retention   Acute respiratory failure  with hypoxia   Fall   Hypoxia   Nausea and vomiting   CAP (community acquired pneumonia)   COPD with acute exacerbation   Abdominal pain, epigastric   Atrial fibrillation, new onset   Atrial fibrillation with RVR   Chest pain   RUQ pain   Renal cyst, right   AAA (abdominal aortic aneurysm) without rupture   Ventral hernia   BPH (benign prostatic hyperplasia)    Time spent: 30 min    Nyaire Denbleyker, [facility]  [facility] Pager [phone]. If 7PM-7AM, please contact night-coverage at [email or web address], password [facility] 11/11/2014, 3:50 PM  LOS: 2 days

## 2014-11-11 NOTE — Progress Notes (Signed)
Orthopedic Tech Progress Note Patient Details:  Neil Terry 1928/01/16 [id number]  Ortho Devices Type of Ortho Device: Abdominal binder Ortho Device/Splint Interventions: Application   [name] 11/11/2014, 9:37 AM

## 2014-11-11 NOTE — Progress Notes (Signed)
Patient Name: Neil Terry Date of Encounter: 11/11/2014     Principal Problem:   Right hip pain Active Problems:   Acute urinary retention   Acute respiratory failure with hypoxia   Fall   Hypoxia   Nausea and vomiting   CAP (community acquired pneumonia)   COPD with acute exacerbation   Abdominal pain, epigastric   Atrial fibrillation, new onset   Atrial fibrillation with RVR   Chest pain   RUQ pain   Renal cyst, right   AAA (abdominal aortic aneurysm) without rupture   Ventral hernia   BPH (benign prostatic hyperplasia)    SUBJECTIVE  This morning the patient is not expressing any chest pain or increased dyspnea.  His abdominal pain is better.  General surgery and GI have seen him and do not anticipate any surgical procedures being needed. He has had his echo, results pending.  His Myoview stress test is scheduled for later today.  Remains nothing by mouth. Rhythm remains normal sinus rhythm CURRENT MEDS . ampicillin-sulbactam (UNASYN) IV  1.5 g Intravenous Q6H  . aspirin EC  325 mg Oral Daily  . azithromycin  500 mg Intravenous Q24H  . budesonide (PULMICORT) nebulizer solution  0.25 mg Nebulization BID  . levalbuterol  1.25 mg Nebulization TID  . methylPREDNISolone (SOLU-MEDROL) injection  60 mg Intravenous BID  . metoprolol tartrate  12.5 mg Oral BID  . pantoprazole  40 mg Oral Q0600  . pneumococcal 23 valent vaccine  0.5 mL Intramuscular Tomorrow-1000  . tamsulosin  0.4 mg Oral Daily    OBJECTIVE  Filed Vitals:   11/10/14 1251 11/10/14 1359 11/10/14 2111 11/11/14 0525  BP: 98/53 107/57 131/67 133/78  Pulse: 101 78 65 60  Temp:  98.2 F (36.8 C) 98.6 F (37 C) 97.7 F (36.5 C)  TempSrc:  Oral Oral Oral  Resp:   20 20  Height:      Weight:    167 lb 3.2 oz (75.841 kg)  SpO2: 96% 97% 97% 97%    Intake/Output Summary (Last 24 hours) at 11/11/14 1108 Last data filed at 11/11/14 1033  Gross per 24 hour  Intake 1446.99 ml  Output   2100 ml  Net  -653.01 ml   Filed Weights   11/10/14 1100 11/11/14 0525  Weight: 160 lb 4.4 oz (72.7 kg) 167 lb 3.2 oz (75.841 kg)    PHYSICAL EXAM  General: Pleasant, NAD. Neuro: Alert and oriented X 3. Moves all extremities spontaneously. Psych: Normal affect. HEENT:  Normal  Neck: Supple without bruits or JVD. Lungs:  Resp regular and unlabored, CTA. Heart: RRR no s3, s4, or murmurs. Abdomen: Soft, non-tender, non-distended, BS + x 4.  Extremities: No clubbing, cyanosis or edema. DP/PT/Radials 2+ and equal bilaterally.  Accessory Clinical Findings  CBC  Recent Labs  11/10/14 0520 11/11/14 0351  WBC 9.1 13.0*  HGB 13.2 12.5*  HCT 40.0 38.0*  MCV 89.3 89.4  PLT 173 188   Basic Metabolic Panel  Recent Labs  11/10/14 0520 11/10/14 1220 11/11/14 0351  NA 134*  --  134*  K 4.5  --  4.1  CL 99*  --  100*  CO2 29  --  29  GLUCOSE 185*  --  148*  BUN 10  --  12  CREATININE 0.81  --  0.91  CALCIUM 8.5*  --  8.1*  MG  --  2.0  --    Liver Function Tests No results for input(s): AST, ALT, ALKPHOS, BILITOT, PROT,  ALBUMIN in the last 72 hours.  Recent Labs  11/08/14 1110  LIPASE 17*   Cardiac Enzymes  Recent Labs  11/10/14 1220 11/10/14 2101 11/10/14 2357  TROPONINI <0.03 0.05* 0.05*   BNP Invalid input(s): POCBNP D-Dimer No results for input(s): DDIMER in the last 72 hours. Hemoglobin A1C  Recent Labs  11/10/14 1220  HGBA1C 5.6   Fasting Lipid Panel  Recent Labs  11/10/14 1220  CHOL 185  HDL 48  LDLCALC 121*  TRIG 78  CHOLHDL 3.9   Thyroid Function Tests  Recent Labs  11/10/14 1220  TSH 0.383    TELE   normal sinus rhythm  ECG  EKG now that he is in normal sinus rhythm shows no ischemic changes and is within normal limits.  Radiology/Studies  Ct Chest W Contrast  11/09/2014   CLINICAL DATA:  acute respiratory failure, hypoxia and pulmonary infiltrates seen on CXR.  EXAM: CT CHEST, ABDOMEN, AND PELVIS WITH CONTRAST  TECHNIQUE:  Multidetector CT imaging of the chest, abdomen and pelvis was performed following the standard protocol during bolus administration of intravenous contrast.  CONTRAST:  OMNIPAQUE IOHEXOL 300 MG/ML  SOLN  COMPARISON:  None.  FINDINGS: CT CHEST FINDINGS  Mediastinum/Nodes: Mild cardiac enlargement. No pericardial effusion. Patulous esophagus containing oral contrast.  No significant hilar or mediastinal adenopathy. Thoracic aorta shows mild calcification.  Lungs/Pleura: Study suffers from mild motion artifact. Mild dependent infiltrate bilaterally likely atelectasis. Mild atelectasis inferior right middle lobe and inferior lingula. Trace pleural effusions.  Musculoskeletal: Deformity surgical neck right humerus partially visualized without acute fracture line. Correlate for history of prior trauma. Compression deformities throughout the thoracic spine ranging from mild 2 moderate severity. No retropulsion causing significant canal narrowing.  CT ABDOMEN PELVIS FINDINGS  Hepatobiliary: 1.5 cm cyst lateral left lobe of liver. Trace pneumobilia left lobe of liver. Minimal thickening of gallbladder wall with gallbladder wall enhancement. No gallstones identified. Common bile duct normal at 6 mm.  Pancreas: Normal  Spleen: Normal  Adrenals/Urinary Tract: Mild dilatation right ureter. Moderate dilatation left ureter. Bilateral dilatation of the renal pelvises. Normal bilateral renal enhancement. Exophytic 1.8 cm lesion midpole left kidney average attenuation of 27 possibly a complicated cyst. Mild right renal vascular calcification. Foley balloon catheter noted. Severe bladder wall thickening diffusely. A few foci of air are seen near the dome of the bladder.  Stomach/Bowel: Status post gastrojejunostomy. Dehiscent midline abdominal wall musculature. Numerous loops of small and large bowel are involved with this.Many loops show narrowing at the hernia site, but there is no surrounding inflammatory change or wall  edema, nor pneumatosis, to indicate strangulation. There is a tiny focus of gas in the anterior abdominal wall to the left of the hernia, image 95, which could be related to recent medication injection, and is not clearly related to the hernia other than by proximity.  Vascular/Lymphatic: Atherosclerotic calcification abdominal aorta. Minimal aneurysmal dilatation infrarenal aorta to 3 cm. Mild iliac artery dilatation to 17 mm on the left and 15 mm on the right.  Reproductive: Calcification within the prostate  Other: No ascites  Musculoskeletal: Status post kyphoplasty L1, L2 and L3 with compression deformities at these levels. Mild L4 compression deformity as well. Medullary nail right femur. Evidence of prior a dynamic hip screw left femur. Severe diffuse osteopenia.  IMPRESSION: 1. Dependent infiltrate right lower lobe, more likely due to atelectasis rather than pneumonia. Follow up recommended.  2.Trace left hepatic pneumobilia, etiology uncertain. No visualized pneumatosis involving bowel.  3. Left  renal lesion may be complex cyst but mass not excluded. Consider renal ultrasound.  4. Prior gastrojejunostomy. Numerous loops of small and large bowel involved with anterior abdominal wall hernia. No evidence of obstruction or strangulation. Tiny focus of subcutaneous air to left of hernia may be a medication injection site.  5. Severe bladder wall thickening causing mild to moderate hydronephrosis; differential includes hypertrophy, malignancy, and cystitis  6. Mild abdominal aortic aneurysm. Recommend followup by ultrasound in 3 years. This recommendation follows ACR consensus guidelines: White Paper of the ACR Incidental Findings Committee II on Vascular Findings. J Am Coll Radiol 2013; 16:109-604  7. Severe diffuse osteoporosis with numerous spinal compression deformities, status post kyphoplasty at multiple levels, and evidence of prior trauma to femurs and right humerus.  8. Minimal thickening and enhancement  of the gallbladder wall, may be incidental. Consider right upper quadrant ultrasound if symptomatic.  9.  Other nonacute findings as described above.   Electronically Signed   By: Esperanza Heir M.D.   On: 11/09/2014 16:39   Ct Pelvis Wo Contrast  11/07/2014   CLINICAL DATA:  Right hip pain after a fall today.  EXAM: CT PELVIS AND RIGHT LOWER EXTREMITY WITHOUT CONTRAST  TECHNIQUE: Multidetector CT imaging of the pelvis and right lower extremity was performed according to the standard protocol without intravenous contrast. Multiplanar CT image reconstructions were also generated.  COMPARISON:  Pelvis and right hip radiographs 11/07/2014  FINDINGS: CT PELVIS FINDINGS  Diffuse bone demineralization. The sacrum and SI joints appear intact and symmetrical. Pelvis appears intact. No acute displaced fractures identified. Symphysis pubis is not displaced. No focal bone lesion or bone destruction. Incidental note of cement at L3 consistent with prior kyphoplasty. Compression of the superior and inferior endplates at L4. Changes are likely due to osteoporosis.  Soft tissue pelvis demonstrates vascular calcification in the abdominal aorta. There are multiple ventral abdominal wall hernias containing colon and small bowel. No visualized bowel obstruction. Bladder wall is mildly thickened which may be due to cystitis or hypertrophy from outlet obstruction. Prostate gland is enlarged and prostate calcifications are present.  CT RIGHT LOWER EXTREMITY FINDINGS  Diffuse bone demineralization. Previous intra medullary rod fixation of the femur with distal locking screw. No evidence of displacement of hardware components. Old healed fracture deformity of the inter trochanteric region. No evidence of acute fracture or dislocation of the right femur. No focal bone lesion or bone destruction. No significant effusion at the knee. Soft tissues of the right leg demonstrate diffuse muscular fatty atrophy. Scattered vascular calcifications  in the superficial femoral artery. No focal fluid collection or hematoma.  IMPRESSION: Diffuse bone demineralization. Lower lumbar compression fractures consistent with osteoporosis. No acute fractures demonstrated in the pelvis or right femur. Previous intra medullary rod fixation of the right femur with and old healed fracture of the inter trochanteric region. Diffuse fatty atrophy of the right leg muscles.   Electronically Signed   By: Burman Nieves M.D.   On: 11/07/2014 22:16   Ct Abdomen Pelvis W Contrast  11/09/2014   CLINICAL DATA:  acute respiratory failure, hypoxia and pulmonary infiltrates seen on CXR.  EXAM: CT CHEST, ABDOMEN, AND PELVIS WITH CONTRAST  TECHNIQUE: Multidetector CT imaging of the chest, abdomen and pelvis was performed following the standard protocol during bolus administration of intravenous contrast.  CONTRAST:  OMNIPAQUE IOHEXOL 300 MG/ML  SOLN  COMPARISON:  None.  FINDINGS: CT CHEST FINDINGS  Mediastinum/Nodes: Mild cardiac enlargement. No pericardial effusion. Patulous esophagus containing oral  contrast.  No significant hilar or mediastinal adenopathy. Thoracic aorta shows mild calcification.  Lungs/Pleura: Study suffers from mild motion artifact. Mild dependent infiltrate bilaterally likely atelectasis. Mild atelectasis inferior right middle lobe and inferior lingula. Trace pleural effusions.  Musculoskeletal: Deformity surgical neck right humerus partially visualized without acute fracture line. Correlate for history of prior trauma. Compression deformities throughout the thoracic spine ranging from mild 2 moderate severity. No retropulsion causing significant canal narrowing.  CT ABDOMEN PELVIS FINDINGS  Hepatobiliary: 1.5 cm cyst lateral left lobe of liver. Trace pneumobilia left lobe of liver. Minimal thickening of gallbladder wall with gallbladder wall enhancement. No gallstones identified. Common bile duct normal at 6 mm.  Pancreas: Normal  Spleen: Normal   Adrenals/Urinary Tract: Mild dilatation right ureter. Moderate dilatation left ureter. Bilateral dilatation of the renal pelvises. Normal bilateral renal enhancement. Exophytic 1.8 cm lesion midpole left kidney average attenuation of 27 possibly a complicated cyst. Mild right renal vascular calcification. Foley balloon catheter noted. Severe bladder wall thickening diffusely. A few foci of air are seen near the dome of the bladder.  Stomach/Bowel: Status post gastrojejunostomy. Dehiscent midline abdominal wall musculature. Numerous loops of small and large bowel are involved with this.Many loops show narrowing at the hernia site, but there is no surrounding inflammatory change or wall edema, nor pneumatosis, to indicate strangulation. There is a tiny focus of gas in the anterior abdominal wall to the left of the hernia, image 95, which could be related to recent medication injection, and is not clearly related to the hernia other than by proximity.  Vascular/Lymphatic: Atherosclerotic calcification abdominal aorta. Minimal aneurysmal dilatation infrarenal aorta to 3 cm. Mild iliac artery dilatation to 17 mm on the left and 15 mm on the right.  Reproductive: Calcification within the prostate  Other: No ascites  Musculoskeletal: Status post kyphoplasty L1, L2 and L3 with compression deformities at these levels. Mild L4 compression deformity as well. Medullary nail right femur. Evidence of prior a dynamic hip screw left femur. Severe diffuse osteopenia.  IMPRESSION: 1. Dependent infiltrate right lower lobe, more likely due to atelectasis rather than pneumonia. Follow up recommended.  2.Trace left hepatic pneumobilia, etiology uncertain. No visualized pneumatosis involving bowel.  3. Left renal lesion may be complex cyst but mass not excluded. Consider renal ultrasound.  4. Prior gastrojejunostomy. Numerous loops of small and large bowel involved with anterior abdominal wall hernia. No evidence of obstruction or  strangulation. Tiny focus of subcutaneous air to left of hernia may be a medication injection site.  5. Severe bladder wall thickening causing mild to moderate hydronephrosis; differential includes hypertrophy, malignancy, and cystitis  6. Mild abdominal aortic aneurysm. Recommend followup by ultrasound in 3 years. This recommendation follows ACR consensus guidelines: White Paper of the ACR Incidental Findings Committee II on Vascular Findings. J Am Coll Radiol 2013; 40:981-191  7. Severe diffuse osteoporosis with numerous spinal compression deformities, status post kyphoplasty at multiple levels, and evidence of prior trauma to femurs and right humerus.  8. Minimal thickening and enhancement of the gallbladder wall, may be incidental. Consider right upper quadrant ultrasound if symptomatic.  9.  Other nonacute findings as described above.   Electronically Signed   By: Esperanza Heir M.D.   On: 11/09/2014 16:39   Ct Femur Right Wo Contrast  11/07/2014   CLINICAL DATA:  Right hip pain after a fall today.  EXAM: CT PELVIS AND RIGHT LOWER EXTREMITY WITHOUT CONTRAST  TECHNIQUE: Multidetector CT imaging of the pelvis and right lower extremity  was performed according to the standard protocol without intravenous contrast. Multiplanar CT image reconstructions were also generated.  COMPARISON:  Pelvis and right hip radiographs 11/07/2014  FINDINGS: CT PELVIS FINDINGS  Diffuse bone demineralization. The sacrum and SI joints appear intact and symmetrical. Pelvis appears intact. No acute displaced fractures identified. Symphysis pubis is not displaced. No focal bone lesion or bone destruction. Incidental note of cement at L3 consistent with prior kyphoplasty. Compression of the superior and inferior endplates at L4. Changes are likely due to osteoporosis.  Soft tissue pelvis demonstrates vascular calcification in the abdominal aorta. There are multiple ventral abdominal wall hernias containing colon and small bowel. No  visualized bowel obstruction. Bladder wall is mildly thickened which may be due to cystitis or hypertrophy from outlet obstruction. Prostate gland is enlarged and prostate calcifications are present.  CT RIGHT LOWER EXTREMITY FINDINGS  Diffuse bone demineralization. Previous intra medullary rod fixation of the femur with distal locking screw. No evidence of displacement of hardware components. Old healed fracture deformity of the inter trochanteric region. No evidence of acute fracture or dislocation of the right femur. No focal bone lesion or bone destruction. No significant effusion at the knee. Soft tissues of the right leg demonstrate diffuse muscular fatty atrophy. Scattered vascular calcifications in the superficial femoral artery. No focal fluid collection or hematoma.  IMPRESSION: Diffuse bone demineralization. Lower lumbar compression fractures consistent with osteoporosis. No acute fractures demonstrated in the pelvis or right femur. Previous intra medullary rod fixation of the right femur with and old healed fracture of the inter trochanteric region. Diffuse fatty atrophy of the right leg muscles.   Electronically Signed   By: Burman Nieves M.D.   On: 11/07/2014 22:16   US Abdomen Limited  11/10/2014   CLINICAL DATA:  Acute right upper quadrant abdominal pain.  EXAM: US ABDOMEN LIMITED - RIGHT UPPER QUADRANT  COMPARISON:  None.  FINDINGS: Gallbladder:  No gallstones or wall thickening visualized. No sonographic Murphy sign noted.  Common bile duct:  Diameter: 6.1 mm which is within normal limits.  Liver:  No focal lesion identified. Within normal limits in parenchymal echogenicity.  IMPRESSION: No abnormality seen in the right upper quadrant of the abdomen.   Electronically Signed   By: Lupita Raider, M.D.   On: 11/10/2014 21:30   Dg Chest Port 1 View  11/08/2014   CLINICAL DATA:  Patient with hypoxia.  EXAM: PORTABLE CHEST - 1 VIEW  COMPARISON:  Chest radiograph 09/17/2005  FINDINGS: Monitoring  leads overlie the patient. Stable enlarged cardiac and mediastinal contours. Tortuosity of the thoracic aorta. Elevation right hemidiaphragm. No consolidative pulmonary opacities. Chronic bilateral predominantly lower lung interstitial opacities, likely represent scarring. Abnormal appearance to the proximal humerus/humeral head, incompletely visualized.  IMPRESSION: Abnormal appearance to the proximal humerus/humeral which is incompletely visualized. Recommend dedicated views as this may potentially represent degenerative changes, abnormal appearance from positioning, old fracture or acute injury.  No acute cardiopulmonary process.   Electronically Signed   By: Annia Belt M.D.   On: 11/08/2014 11:30   Dg Hip Unilat With Pelvis 2-3 Views Right  11/07/2014   CLINICAL DATA:  Status post fall today with right-sided hip pain, history of hip fracture 6 years ago.  EXAM: DG HIP (WITH OR WITHOUT PELVIS) 2-3V RIGHT  COMPARISON:  None.  FINDINGS: The bones are osteopenic. There is old deformity of the intertrochanteric region of the right hip. The femoral head is intact. The metallic rod is in reasonable position. The  observed portions of the right hemipelvis exhibit no acute fracture. There is chronic deformity of the superior pubic ramus on the left.  IMPRESSION: There is no acute bony abnormality of the right hip or the visualized portions of the right hemipelvis.   Electronically Signed   By: David  [place] M.D.   On: 11/07/2014 17:25   Dg Femur, Min 2 Views Right  11/08/2014   CLINICAL DATA:  81 year old male unable to bear weight on the right leg.  EXAM: RIGHT FEMUR 2 VIEWS  COMPARISON:  CT of the right lower extremity dated 11/07/2014  FINDINGS: An intra medullary rod is noted in the right femur. There is old healed fracture of the right intertrochanteric region. The bones are osteopenic. No acute fracture identified.  IMPRESSION: No acute fracture or dislocation. Right femoral intra medullary rod.    Electronically Signed   By: Elgie Collard M.D.   On: 11/08/2014 02:01    ASSESSMENT AND PLAN  1.  Paroxysmal atrial fibrillation, now in normal sinus rhythm.  Remains on IV heparin.  Chads vasc score of 2 for hypertension and age.  Awaiting results of echo and Myoview.  Consider long-term anticoagulation with Apixaban if no further invasive studies are needed following results of Myoview 2.  Borderline elevated troponin in a plateau pattern consistent with his atrial fibrillation with RVR.  Signed, Cassell Clement MD

## 2014-11-11 NOTE — Progress Notes (Signed)
Physical Therapy Evaluation Patient Details Name: Neil Terry MRN: [id number] DOB: 1927-05-26 Today's Date: 11/11/2014   History of Present Illness  81 yo male post fall at home, with R hip pain, complicated by urinary retention, respiratory failure, and A-fib.  Clinical Impression  Patient seen with family present.  Pain initially high, reduced with activity.  MIN assist for bed mobility and room level ambulation, O2 saturation maintained on supplemental air.  Patient has stairs at son's home and will benefit from additional training for stair technique.  Overall mobility limited by multiple line involvement.  Patient will also benefit from continued bed mobility, pain management, strength, balance and gait training, and will remain on PT caseload while in hospital, but anticipated to return home with family when medically stable.    Follow Up Recommendations Home health PT;Supervision/Assistance - 24 hour    Equipment Recommendations  None recommended by PT (Family reports have equipment already)    Recommendations for Other Services       Precautions / Restrictions Precautions Precautions: Fall Restrictions Weight Bearing Restrictions: Yes RLE Weight Bearing: Weight bearing as tolerated      Mobility  Bed Mobility Overal bed mobility: Needs Assistance Bed Mobility: Supine to Sit;Sit to Supine     Supine to sit: Min assist Sit to supine: Min assist   General bed mobility comments: due to pain  Transfers Overall transfer level: Needs assistance Equipment used: Rolling walker (2 wheeled) Transfers: Sit to/from Stand Sit to Stand: Min assist         General transfer comment: Multiple line assistance  Ambulation/Gait Ambulation/Gait assistance: Supervision;Min guard (multiple line management) Ambulation Distance (Feet): 20 Feet Assistive device: Rolling walker (2 wheeled) Gait Pattern/deviations: Decreased stride length;Antalgic   Gait velocity interpretation:  Below normal speed for age/gender    Stairs            Wheelchair Mobility    Modified Rankin (Stroke Patients Only)       Balance Overall balance assessment: Needs assistance;History of Falls   Sitting balance-Leahy Scale: Good     Standing balance support: Bilateral upper extremity supported Standing balance-Leahy Scale: Poor                               Pertinent Vitals/Pain Pain Assessment: 0-10 Pain Score: 8  Pain Location: Right hip/thigh Pain Descriptors / Indicators: Aching;Sore Pain Intervention(s): Repositioned;Limited activity within patient's tolerance;Monitored during session    Home Living Family/patient expects to be discharged to:: Private residence Living Arrangements: Children;Other relatives (patient lives with sons ) Available Help at Discharge: Family Type of Home: House Home Access: Stairs to enter     Home Layout: Two level Home Equipment: Cane - single point;Walker - 2 wheels Additional Comments: History of frequent falls, normally uses cane or nothing.    Prior Function Level of Independence: Independent with assistive device(s)         Comments: History of falls     Hand Dominance        Extremity/Trunk Assessment   Upper Extremity Assessment: Overall WFL for tasks assessed;Defer to OT evaluation           Lower Extremity Assessment: RLE deficits/detail;Overall WFL for tasks assessed RLE Deficits / Details: Pain and weakness       Communication   Communication: Prefers language other than English (Arabic)  Cognition Arousal/Alertness: Awake/alert Behavior During Therapy: WFL for tasks assessed/performed Overall Cognitive Status: Within Functional Limits for tasks  assessed                      General Comments General comments (skin integrity, edema, etc.): Foley catheter, supplemental O2, IV lines, telemetry    Exercises General Exercises - Lower Extremity Heel Slides:  AROM;AAROM;Both;10 reps;Supine Hip ABduction/ADduction: AROM;AAROM;Both;10 reps;Supine Straight Leg Raises: AROM;AAROM;Both;10 reps;Supine      Assessment/Plan    PT Assessment Patient needs continued PT services  PT Diagnosis Difficulty walking;Acute pain;Generalized weakness   PT Problem List Decreased activity tolerance;Decreased balance;Decreased mobility;Pain  PT Treatment Interventions Gait training;Stair training;Functional mobility training;Therapeutic activities;Therapeutic exercise;Balance training;Patient/family education   PT Goals (Current goals can be found in the Care Plan section) Acute Rehab PT Goals Patient Stated Goal: Go home, stop hurting PT Goal Formulation: With patient/family Time For Goal Achievement: 11/25/14 Potential to Achieve Goals: Good    Frequency Min 3X/week   Barriers to discharge Inaccessible home environment Stairs    Co-evaluation               End of Session Equipment Utilized During Treatment: Gait belt Activity Tolerance: Patient tolerated treatment well Patient left: in bed;with call bell/phone within reach;with bed alarm set;with family/visitor present Nurse Communication: Mobility status         Time: 1220-1245 PT Time Calculation (min) (ACUTE ONLY): 25 min   Charges:   PT Evaluation $Initial PT Evaluation Tier I: 1 Procedure PT Treatments $Therapeutic Activity: 8-22 mins   PT G CodesFreida Busman, Gladine Plude L November 22, 2014, 1:38 PM

## 2014-11-12 ENCOUNTER — Inpatient Hospital Stay (HOSPITAL_COMMUNITY): Payer: Medicare Other

## 2014-11-12 DIAGNOSIS — R079 Chest pain, unspecified: Secondary | ICD-10-CM

## 2014-11-12 LAB — BASIC METABOLIC PANEL
Anion gap: 7 (ref 5–15)
BUN: 12 mg/dL (ref 6–20)
CHLORIDE: 105 mmol/L (ref 101–111)
CO2: 26 mmol/L (ref 22–32)
CREATININE: 0.8 mg/dL (ref 0.61–1.24)
Calcium: 8 mg/dL — ABNORMAL LOW (ref 8.9–10.3)
GFR calc Af Amer: >60 mL/min (ref >60)
GFR calc non Af Amer: >60 mL/min (ref >60)
GLUCOSE: 117 mg/dL — AB (ref 65–99)
Potassium: 4.1 mmol/L (ref 3.5–5.1)
SODIUM: 138 mmol/L (ref 135–145)

## 2014-11-12 LAB — CBC
HCT: 37.1 % — ABNORMAL LOW (ref 39.0–52.0)
Hemoglobin: 12.3 g/dL — ABNORMAL LOW (ref 13.0–17.0)
MCH: 29.5 pg (ref 26.0–34.0)
MCHC: 33.2 g/dL (ref 30.0–36.0)
MCV: 89 fL (ref 78.0–100.0)
PLATELETS: 170 10*3/uL (ref 150–400)
RBC: 4.17 MIL/uL — ABNORMAL LOW (ref 4.22–5.81)
RDW: 13.5 % (ref 11.5–15.5)
WBC: 12.6 10*3/uL — AB (ref 4.0–10.5)

## 2014-11-12 LAB — HEPARIN LEVEL (UNFRACTIONATED): Heparin Unfractionated: 0.54 IU/mL (ref 0.30–0.70)

## 2014-11-12 MED ORDER — REGADENOSON 0.4 MG/5ML IV SOLN
0.4000 mg | Freq: Once | INTRAVENOUS | Status: AC
  Administered 2014-11-12: 0.4 mg via INTRAVENOUS

## 2014-11-12 MED ORDER — PREDNISONE 20 MG PO TABS
ORAL_TABLET | ORAL | Status: DC
Start: 2014-11-12 — End: 2014-11-24

## 2014-11-12 MED ORDER — TECHNETIUM TC 99M SESTAMIBI GENERIC - CARDIOLITE
10.4000 | Freq: Once | INTRAVENOUS | Status: AC | PRN
  Administered 2014-11-12: 10 via INTRAVENOUS

## 2014-11-12 MED ORDER — BUDESONIDE-FORMOTEROL FUMARATE 160-4.5 MCG/ACT IN AERO: 2.0000 | INHALATION_SPRAY | Freq: Two times a day (BID) | RESPIRATORY_TRACT | Status: DC

## 2014-11-12 MED ORDER — PANTOPRAZOLE SODIUM 40 MG PO TBEC: 40.0000 mg | DELAYED_RELEASE_TABLET | Freq: Every day | ORAL | Status: DC

## 2014-11-12 MED ORDER — ALBUTEROL SULFATE HFA 108 (90 BASE) MCG/ACT IN AERS: 2.0000 | INHALATION_SPRAY | Freq: Four times a day (QID) | RESPIRATORY_TRACT | Status: DC | PRN

## 2014-11-12 MED ORDER — OXYCODONE HCL 5 MG PO TABS: 5.0000 mg | ORAL_TABLET | ORAL | Status: DC | PRN

## 2014-11-12 MED ORDER — REGADENOSON 0.4 MG/5ML IV SOLN
INTRAVENOUS | Status: AC
  Filled 2014-11-12: qty 5

## 2014-11-12 MED ORDER — TECHNETIUM TC 99M SESTAMIBI - CARDIOLITE
30.0000 | Freq: Once | INTRAVENOUS | Status: AC | PRN
  Administered 2014-11-12: 12:00:00 30 via INTRAVENOUS

## 2014-11-12 MED ORDER — APIXABAN 5 MG PO TABS: 5.0000 mg | ORAL_TABLET | Freq: Two times a day (BID) | ORAL | Status: DC

## 2014-11-12 MED ORDER — METOPROLOL TARTRATE 25 MG PO TABS: 12.5000 mg | ORAL_TABLET | Freq: Two times a day (BID) | ORAL | Status: DC

## 2014-11-12 MED ORDER — TAMSULOSIN HCL 0.4 MG PO CAPS: 0.4000 mg | ORAL_CAPSULE | Freq: Every day | ORAL | Status: DC

## 2014-11-12 MED ORDER — AMOXICILLIN-POT CLAVULANATE 875-125 MG PO TABS: 1.0000 | ORAL_TABLET | Freq: Two times a day (BID) | ORAL | Status: DC

## 2014-11-12 MED ORDER — APIXABAN 5 MG PO TABS
5.0000 mg | ORAL_TABLET | Freq: Two times a day (BID) | ORAL | Status: DC
  Administered 2014-11-12: 5 mg via ORAL
  Filled 2014-11-12: qty 1

## 2014-11-12 NOTE — Progress Notes (Signed)
Pt converted back to NSR after receiving held AM dose of metoprolol.

## 2014-11-12 NOTE — Progress Notes (Signed)
Patient Name: Neil Terry Date of Encounter: 11/12/2014     Principal Problem:   Right hip pain Active Problems:   Acute urinary retention   Acute respiratory failure with hypoxia   Fall   Hypoxia   Nausea and vomiting   CAP (community acquired pneumonia)   COPD with acute exacerbation   Abdominal pain, epigastric   Atrial fibrillation, new onset   Atrial fibrillation with RVR   Chest pain   RUQ pain   Renal cyst, right   AAA (abdominal aortic aneurysm) without rupture   Ventral hernia   BPH (benign prostatic hyperplasia)    SUBJECTIVE  Seen in nuclear medicine for stress test. NO CP or SOB. Tolerated procedure very well. Await nuclear images.   CURRENT MEDS . ampicillin-sulbactam (UNASYN) IV  1.5 g Intravenous Q6H  . aspirin EC  325 mg Oral Daily  . budesonide (PULMICORT) nebulizer solution  0.25 mg Nebulization BID  . levalbuterol  1.25 mg Nebulization TID  . metoprolol tartrate  12.5 mg Oral BID  . pantoprazole  40 mg Oral Q0600  . pneumococcal 23 valent vaccine  0.5 mL Intramuscular Tomorrow-1000  . predniSONE  40 mg Oral Q breakfast  . regadenoson      . tamsulosin  0.4 mg Oral Daily    OBJECTIVE  Filed Vitals:   11/11/14 2040 11/12/14 0332 11/12/14 0726 11/12/14 0919  BP: 135/70 149/54  129/67  Pulse: 66 67    Temp: 98 F (36.7 C) 98.1 F (36.7 C)    TempSrc: Oral Oral    Resp: 16 18    Height:      Weight:  167 lb 4.8 oz (75.887 kg)    SpO2: 100% 96% 99%     Intake/Output Summary (Last 24 hours) at 11/12/14 0951 Last data filed at 11/12/14 0933  Gross per 24 hour  Intake    440 ml  Output   3475 ml  Net  -3035 ml   Filed Weights   11/10/14 1100 11/11/14 0525 11/12/14 0332  Weight: 160 lb 4.4 oz (72.7 kg) 167 lb 3.2 oz (75.841 kg) 167 lb 4.8 oz (75.887 kg)    PHYSICAL EXAM  General: Pleasant, NAD. Neuro: Alert and oriented X 3. Moves all extremities spontaneously. Psych: Normal affect. HEENT:  Normal  Neck: Supple without bruits  or JVD. Lungs:  Resp regular and unlabored, CTA. Heart: RRR no s3, s4, or murmurs. Abdomen: Soft, non-tender, non-distended, BS + x 4.  Extremities: No clubbing, cyanosis or edema. DP/PT/Radials 2+ and equal bilaterally.  Accessory Clinical Findings  CBC  Recent Labs  11/11/14 0351 11/12/14 0353  WBC 13.0* 12.6*  HGB 12.5* 12.3*  HCT 38.0* 37.1*  MCV 89.4 89.0  PLT 188 170   Basic Metabolic Panel  Recent Labs  11/10/14 1220 11/11/14 0351 11/12/14 0353  NA  --  134* 138  K  --  4.1 4.1  CL  --  100* 105  CO2  --  29 26  GLUCOSE  --  148* 117*  BUN  --  12 12  CREATININE  --  0.91 0.80  CALCIUM  --  8.1* 8.0*  MG 2.0  --   --    Liver Function Tests No results for input(s): AST, ALT, ALKPHOS, BILITOT, PROT, ALBUMIN in the last 72 hours. No results for input(s): LIPASE, AMYLASE in the last 72 hours. Cardiac Enzymes  Recent Labs  11/10/14 1220 11/10/14 2101 11/10/14 2357  TROPONINI <0.03 0.05* 0.05*  BNP Invalid input(s): POCBNP D-Dimer No results for input(s): DDIMER in the last 72 hours. Hemoglobin A1C  Recent Labs  11/10/14 1220  HGBA1C 5.6   Fasting Lipid Panel  Recent Labs  11/10/14 1220  CHOL 185  HDL 48  LDLCALC 121*  TRIG 78  CHOLHDL 3.9   Thyroid Function Tests  Recent Labs  11/10/14 1220  TSH 0.383    TELE  NSR   Radiology/Studies  Ct Chest W Contrast  11/09/2014   CLINICAL DATA:  acute respiratory failure, hypoxia and pulmonary infiltrates seen on CXR.  EXAM: CT CHEST, ABDOMEN, AND PELVIS WITH CONTRAST  TECHNIQUE: Multidetector CT imaging of the chest, abdomen and pelvis was performed following the standard protocol during bolus administration of intravenous contrast.  CONTRAST:  OMNIPAQUE IOHEXOL 300 MG/ML  SOLN  COMPARISON:  None.  FINDINGS: CT CHEST FINDINGS  Mediastinum/Nodes: Mild cardiac enlargement. No pericardial effusion. Patulous esophagus containing oral contrast.  No significant hilar or mediastinal  adenopathy. Thoracic aorta shows mild calcification.  Lungs/Pleura: Study suffers from mild motion artifact. Mild dependent infiltrate bilaterally likely atelectasis. Mild atelectasis inferior right middle lobe and inferior lingula. Trace pleural effusions.  Musculoskeletal: Deformity surgical neck right humerus partially visualized without acute fracture line. Correlate for history of prior trauma. Compression deformities throughout the thoracic spine ranging from mild 2 moderate severity. No retropulsion causing significant canal narrowing.  CT ABDOMEN PELVIS FINDINGS  Hepatobiliary: 1.5 cm cyst lateral left lobe of liver. Trace pneumobilia left lobe of liver. Minimal thickening of gallbladder wall with gallbladder wall enhancement. No gallstones identified. Common bile duct normal at 6 mm.  Pancreas: Normal  Spleen: Normal  Adrenals/Urinary Tract: Mild dilatation right ureter. Moderate dilatation left ureter. Bilateral dilatation of the renal pelvises. Normal bilateral renal enhancement. Exophytic 1.8 cm lesion midpole left kidney average attenuation of 27 possibly a complicated cyst. Mild right renal vascular calcification. Foley balloon catheter noted. Severe bladder wall thickening diffusely. A few foci of air are seen near the dome of the bladder.  Stomach/Bowel: Status post gastrojejunostomy. Dehiscent midline abdominal wall musculature. Numerous loops of small and large bowel are involved with this.Many loops show narrowing at the hernia site, but there is no surrounding inflammatory change or wall edema, nor pneumatosis, to indicate strangulation. There is a tiny focus of gas in the anterior abdominal wall to the left of the hernia, image 95, which could be related to recent medication injection, and is not clearly related to the hernia other than by proximity.  Vascular/Lymphatic: Atherosclerotic calcification abdominal aorta. Minimal aneurysmal dilatation infrarenal aorta to 3 cm. Mild iliac artery  dilatation to 17 mm on the left and 15 mm on the right.  Reproductive: Calcification within the prostate  Other: No ascites  Musculoskeletal: Status post kyphoplasty L1, L2 and L3 with compression deformities at these levels. Mild L4 compression deformity as well. Medullary nail right femur. Evidence of prior a dynamic hip screw left femur. Severe diffuse osteopenia.  IMPRESSION: 1. Dependent infiltrate right lower lobe, more likely due to atelectasis rather than pneumonia. Follow up recommended.  2.Trace left hepatic pneumobilia, etiology uncertain. No visualized pneumatosis involving bowel.  3. Left renal lesion may be complex cyst but mass not excluded. Consider renal ultrasound.  4. Prior gastrojejunostomy. Numerous loops of small and large bowel involved with anterior abdominal wall hernia. No evidence of obstruction or strangulation. Tiny focus of subcutaneous air to left of hernia may be a medication injection site.  5. Severe bladder wall thickening causing mild  to moderate hydronephrosis; differential includes hypertrophy, malignancy, and cystitis  6. Mild abdominal aortic aneurysm. Recommend followup by ultrasound in 3 years. This recommendation follows ACR consensus guidelines: White Paper of the ACR Incidental Findings Committee II on Vascular Findings. J Am Coll Radiol 2013; 16:109-604  7. Severe diffuse osteoporosis with numerous spinal compression deformities, status post kyphoplasty at multiple levels, and evidence of prior trauma to femurs and right humerus.  8. Minimal thickening and enhancement of the gallbladder wall, may be incidental. Consider right upper quadrant ultrasound if symptomatic.  9.  Other nonacute findings as described above.   Electronically Signed   By: Esperanza Heir M.D.   On: 11/09/2014 16:39   Ct Pelvis Wo Contrast  11/07/2014   CLINICAL DATA:  Right hip pain after a fall today.  EXAM: CT PELVIS AND RIGHT LOWER EXTREMITY WITHOUT CONTRAST  TECHNIQUE: Multidetector CT  imaging of the pelvis and right lower extremity was performed according to the standard protocol without intravenous contrast. Multiplanar CT image reconstructions were also generated.  COMPARISON:  Pelvis and right hip radiographs 11/07/2014  FINDINGS: CT PELVIS FINDINGS  Diffuse bone demineralization. The sacrum and SI joints appear intact and symmetrical. Pelvis appears intact. No acute displaced fractures identified. Symphysis pubis is not displaced. No focal bone lesion or bone destruction. Incidental note of cement at L3 consistent with prior kyphoplasty. Compression of the superior and inferior endplates at L4. Changes are likely due to osteoporosis.  Soft tissue pelvis demonstrates vascular calcification in the abdominal aorta. There are multiple ventral abdominal wall hernias containing colon and small bowel. No visualized bowel obstruction. Bladder wall is mildly thickened which may be due to cystitis or hypertrophy from outlet obstruction. Prostate gland is enlarged and prostate calcifications are present.  CT RIGHT LOWER EXTREMITY FINDINGS  Diffuse bone demineralization. Previous intra medullary rod fixation of the femur with distal locking screw. No evidence of displacement of hardware components. Old healed fracture deformity of the inter trochanteric region. No evidence of acute fracture or dislocation of the right femur. No focal bone lesion or bone destruction. No significant effusion at the knee. Soft tissues of the right leg demonstrate diffuse muscular fatty atrophy. Scattered vascular calcifications in the superficial femoral artery. No focal fluid collection or hematoma.  IMPRESSION: Diffuse bone demineralization. Lower lumbar compression fractures consistent with osteoporosis. No acute fractures demonstrated in the pelvis or right femur. Previous intra medullary rod fixation of the right femur with and old healed fracture of the inter trochanteric region. Diffuse fatty atrophy of the right leg  muscles.   Electronically Signed   By: Burman Nieves M.D.   On: 11/07/2014 22:16   Ct Abdomen Pelvis W Contrast  11/09/2014   CLINICAL DATA:  acute respiratory failure, hypoxia and pulmonary infiltrates seen on CXR.  EXAM: CT CHEST, ABDOMEN, AND PELVIS WITH CONTRAST  TECHNIQUE: Multidetector CT imaging of the chest, abdomen and pelvis was performed following the standard protocol during bolus administration of intravenous contrast.  CONTRAST:  OMNIPAQUE IOHEXOL 300 MG/ML  SOLN  COMPARISON:  None.  FINDINGS: CT CHEST FINDINGS  Mediastinum/Nodes: Mild cardiac enlargement. No pericardial effusion. Patulous esophagus containing oral contrast.  No significant hilar or mediastinal adenopathy. Thoracic aorta shows mild calcification.  Lungs/Pleura: Study suffers from mild motion artifact. Mild dependent infiltrate bilaterally likely atelectasis. Mild atelectasis inferior right middle lobe and inferior lingula. Trace pleural effusions.  Musculoskeletal: Deformity surgical neck right humerus partially visualized without acute fracture line. Correlate for history of prior trauma. Compression  deformities throughout the thoracic spine ranging from mild 2 moderate severity. No retropulsion causing significant canal narrowing.  CT ABDOMEN PELVIS FINDINGS  Hepatobiliary: 1.5 cm cyst lateral left lobe of liver. Trace pneumobilia left lobe of liver. Minimal thickening of gallbladder wall with gallbladder wall enhancement. No gallstones identified. Common bile duct normal at 6 mm.  Pancreas: Normal  Spleen: Normal  Adrenals/Urinary Tract: Mild dilatation right ureter. Moderate dilatation left ureter. Bilateral dilatation of the renal pelvises. Normal bilateral renal enhancement. Exophytic 1.8 cm lesion midpole left kidney average attenuation of 27 possibly a complicated cyst. Mild right renal vascular calcification. Foley balloon catheter noted. Severe bladder wall thickening diffusely. A few foci of air are seen near the  dome of the bladder.  Stomach/Bowel: Status post gastrojejunostomy. Dehiscent midline abdominal wall musculature. Numerous loops of small and large bowel are involved with this.Many loops show narrowing at the hernia site, but there is no surrounding inflammatory change or wall edema, nor pneumatosis, to indicate strangulation. There is a tiny focus of gas in the anterior abdominal wall to the left of the hernia, image 95, which could be related to recent medication injection, and is not clearly related to the hernia other than by proximity.  Vascular/Lymphatic: Atherosclerotic calcification abdominal aorta. Minimal aneurysmal dilatation infrarenal aorta to 3 cm. Mild iliac artery dilatation to 17 mm on the left and 15 mm on the right.  Reproductive: Calcification within the prostate  Other: No ascites  Musculoskeletal: Status post kyphoplasty L1, L2 and L3 with compression deformities at these levels. Mild L4 compression deformity as well. Medullary nail right femur. Evidence of prior a dynamic hip screw left femur. Severe diffuse osteopenia.  IMPRESSION: 1. Dependent infiltrate right lower lobe, more likely due to atelectasis rather than pneumonia. Follow up recommended.  2.Trace left hepatic pneumobilia, etiology uncertain. No visualized pneumatosis involving bowel.  3. Left renal lesion may be complex cyst but mass not excluded. Consider renal ultrasound.  4. Prior gastrojejunostomy. Numerous loops of small and large bowel involved with anterior abdominal wall hernia. No evidence of obstruction or strangulation. Tiny focus of subcutaneous air to left of hernia may be a medication injection site.  5. Severe bladder wall thickening causing mild to moderate hydronephrosis; differential includes hypertrophy, malignancy, and cystitis  6. Mild abdominal aortic aneurysm. Recommend followup by ultrasound in 3 years. This recommendation follows ACR consensus guidelines: White Paper of the ACR Incidental Findings  Committee II on Vascular Findings. J Am Coll Radiol 2013; 40:981-191  7. Severe diffuse osteoporosis with numerous spinal compression deformities, status post kyphoplasty at multiple levels, and evidence of prior trauma to femurs and right humerus.  8. Minimal thickening and enhancement of the gallbladder wall, may be incidental. Consider right upper quadrant ultrasound if symptomatic.  9.  Other nonacute findings as described above.   Electronically Signed   By: Esperanza Heir M.D.   On: 11/09/2014 16:39   Ct Femur Right Wo Contrast  11/07/2014   CLINICAL DATA:  Right hip pain after a fall today.  EXAM: CT PELVIS AND RIGHT LOWER EXTREMITY WITHOUT CONTRAST  TECHNIQUE: Multidetector CT imaging of the pelvis and right lower extremity was performed according to the standard protocol without intravenous contrast. Multiplanar CT image reconstructions were also generated.  COMPARISON:  Pelvis and right hip radiographs 11/07/2014  FINDINGS: CT PELVIS FINDINGS  Diffuse bone demineralization. The sacrum and SI joints appear intact and symmetrical. Pelvis appears intact. No acute displaced fractures identified. Symphysis pubis is not displaced. No focal  bone lesion or bone destruction. Incidental note of cement at L3 consistent with prior kyphoplasty. Compression of the superior and inferior endplates at L4. Changes are likely due to osteoporosis.  Soft tissue pelvis demonstrates vascular calcification in the abdominal aorta. There are multiple ventral abdominal wall hernias containing colon and small bowel. No visualized bowel obstruction. Bladder wall is mildly thickened which may be due to cystitis or hypertrophy from outlet obstruction. Prostate gland is enlarged and prostate calcifications are present.  CT RIGHT LOWER EXTREMITY FINDINGS  Diffuse bone demineralization. Previous intra medullary rod fixation of the femur with distal locking screw. No evidence of displacement of hardware components. Old healed fracture  deformity of the inter trochanteric region. No evidence of acute fracture or dislocation of the right femur. No focal bone lesion or bone destruction. No significant effusion at the knee. Soft tissues of the right leg demonstrate diffuse muscular fatty atrophy. Scattered vascular calcifications in the superficial femoral artery. No focal fluid collection or hematoma.  IMPRESSION: Diffuse bone demineralization. Lower lumbar compression fractures consistent with osteoporosis. No acute fractures demonstrated in the pelvis or right femur. Previous intra medullary rod fixation of the right femur with and old healed fracture of the inter trochanteric region. Diffuse fatty atrophy of the right leg muscles.   Electronically Signed   By: Burman Nieves M.D.   On: 11/07/2014 22:16   US Abdomen Limited  11/10/2014   CLINICAL DATA:  Acute right upper quadrant abdominal pain.  EXAM: US ABDOMEN LIMITED - RIGHT UPPER QUADRANT  COMPARISON:  None.  FINDINGS: Gallbladder:  No gallstones or wall thickening visualized. No sonographic Murphy sign noted.  Common bile duct:  Diameter: 6.1 mm which is within normal limits.  Liver:  No focal lesion identified. Within normal limits in parenchymal echogenicity.  IMPRESSION: No abnormality seen in the right upper quadrant of the abdomen.   Electronically Signed   By: Lupita Raider, M.D.   On: 11/10/2014 21:30   Dg Chest Port 1 View  11/08/2014   CLINICAL DATA:  Patient with hypoxia.  EXAM: PORTABLE CHEST - 1 VIEW  COMPARISON:  Chest radiograph 09/17/2005  FINDINGS: Monitoring leads overlie the patient. Stable enlarged cardiac and mediastinal contours. Tortuosity of the thoracic aorta. Elevation right hemidiaphragm. No consolidative pulmonary opacities. Chronic bilateral predominantly lower lung interstitial opacities, likely represent scarring. Abnormal appearance to the proximal humerus/humeral head, incompletely visualized.  IMPRESSION: Abnormal appearance to the proximal  humerus/humeral which is incompletely visualized. Recommend dedicated views as this may potentially represent degenerative changes, abnormal appearance from positioning, old fracture or acute injury.  No acute cardiopulmonary process.   Electronically Signed   By: Annia Belt M.D.   On: 11/08/2014 11:30   Dg Hip Unilat With Pelvis 2-3 Views Right  11/07/2014   CLINICAL DATA:  Status post fall today with right-sided hip pain, history of hip fracture 6 years ago.  EXAM: DG HIP (WITH OR WITHOUT PELVIS) 2-3V RIGHT  COMPARISON:  None.  FINDINGS: The bones are osteopenic. There is old deformity of the intertrochanteric region of the right hip. The femoral head is intact. The metallic rod is in reasonable position. The observed portions of the right hemipelvis exhibit no acute fracture. There is chronic deformity of the superior pubic ramus on the left.  IMPRESSION: There is no acute bony abnormality of the right hip or the visualized portions of the right hemipelvis.   Electronically Signed   By: David  [place] M.D.   On: 11/07/2014 17:25  Dg Femur, Min 2 Views Right  11/08/2014   CLINICAL DATA:  81 year old male unable to bear weight on the right leg.  EXAM: RIGHT FEMUR 2 VIEWS  COMPARISON:  CT of the right lower extremity dated 11/07/2014  FINDINGS: An intra medullary rod is noted in the right femur. There is old healed fracture of the right intertrochanteric region. The bones are osteopenic. No acute fracture identified.  IMPRESSION: No acute fracture or dislocation. Right femoral intra medullary rod.   Electronically Signed   By: Elgie Collard M.D.   On: 11/08/2014 02:01    ASSESSMENT AND PLAN  1. Paroxysmal atrial fibrillation, now in normal sinus rhythm. Remains on IV heparin. Chads vasc score of 2 for hypertension and age. Awaiting results of Myoview. Consider long-term anticoagulation with Apixaban if no further invasive studies are needed following results of Myoview -- 2D ECHO EF 60-65%, mod  LVH, mild focal basal hypertrophy of the septum. Mild LA dilation. PA pressure 35  -- Undergoing nuclear stress test today. Await images. If negative he can likely be discharged home on Eliquis.  2. Borderline elevated troponin in a plateau pattern consistent with his atrial fibrillation with RVR. Await stress test results.   Billy Fischer PA-C  Pager [phone] The patient had his Lexiscan Myoview stress test earlier today.  The results are pending.  Telemetry at present shows that he is back in atrial fibrillation.  He appears to be going in and out of atrial fibrillation.  The patient himself does not appear to be aware of his heart rate or rhythm.  He will need long-term anticoagulation.  Chadsvasc score of 2 for age and hypertension.  Possible discharge today if Myoview stress test shows no large area of ischemia.  2-D echo yesterday showed normal left ventricular systolic function with ejection fraction 60-65%

## 2014-11-12 NOTE — Care Management Note (Signed)
Case Management Note  Patient Details  Name: Anibal Quinby MRN: [id number] Date of Birth: 10-Sep-1927  Subjective/Objective:                    Action/Plan:   Expected Discharge Date:                  Expected Discharge Plan:  Home w Home Health Services  In-House Referral:     Discharge planning Services  CM Consult, Medication Assistance  Post Acute Care Choice:  Home Health Choice offered to:  Adult Children, Patient  DME Arranged:  3-N-1 DME Agency:  [facility]  HH Arranged:  PT HH Agency:  [facility]  Status of Service:  Completed, signed off  Medicare Important Message Given:  Yes-second notification given Date Medicare IM Given:    Medicare IM give by:    Date Additional Medicare IM Given:    Additional Medicare Important Message give by:     If discussed at Long Length of Stay Meetings, dates discussed:    Additional Comments: Patient/Family selected [facility] for HHPT. Tiffany at [facility] notified of the referral. Eliquis 30-day free card given to patient.  Antony Haste, RN 11/12/2014, 2:20 PM

## 2014-11-12 NOTE — Care Management Important Message (Signed)
Important Message  Patient Details  Name: Neil Terry MRN: [id number] Date of Birth: [birth year]   Medicare Important Message Given:  Yes-second notification given    Antony Haste, RN 11/12/2014, 12:30 PM

## 2014-11-12 NOTE — Consult Note (Addendum)
ANTICOAGULATION CONSULT NOTE - Follow-Up  Pharmacy Consult for heparin and Eliquis Indication: atrial fibrillation  No Known Allergies  Patient Measurements: Height:  (167.6 cm) Weight: 167 lb 4.8 oz (75.887 kg) IBW/kg (Calculated) : 63.8  Vital Signs: Temp: 98.1 F (36.7 C) (09/11 0332) Temp Source: Oral (09/11 0332) BP: 129/67 mmHg (09/11 0919) Pulse Rate: 67 (09/11 0332)  Labs:  Recent Labs  11/10/14 0520 11/10/14 1220 11/10/14 2101 11/10/14 2357 11/11/14 0351 11/12/14 0353  HGB 13.2  --   --   --  12.5* 12.3*  HCT 40.0  --   --   --  38.0* 37.1*  PLT 173  --   --   --  188 170  HEPARINUNFRC  --   --  0.52  --  0.56 0.54  CREATININE 0.81  --   --   --  0.91 0.80  TROPONINI  --  <0.03 0.05* 0.05*  --   --     Estimated Creatinine Clearance: 62 mL/min (by C-G formula based on Cr of 0.8).  Assessment: 81 yo male who presented to [facility] on 11/07[facility]14 PM with right hip pain after a mechanical fall at home. He was started on IV heparin infusion on 9/9 for new onset atrial fibrillation.   HL remains therapeutic at 0.54 on heparin drip rate 1050 units/hr.   Hgb 12.3 (down from 16.1 3d ago, +Urinary RBCs), Plt 170. No overt bleeding noted. Pt does have hx of perforated ulcer, currently with abd pain d/t hernias.  Goal of Therapy:  Heparin level 0.3-0.7 units/ml Monitor platelets by anticoagulation protocol: Yes   Plan:  Continue IV Heparin at 1050 units/hr until patient receives Eliquis Eliquis 5 mg BID Monitor for s/sx of bleeding, watch Hgb closely  Arcola Jansky, PharmD Clinical Pharmacy Resident Pager: [phone] 11/12/2014 9:47 AM

## 2014-11-12 NOTE — Discharge Summary (Signed)
Physician Discharge Summary  Neil [name]:[id number] DOB: [birth year] DOA: 11/07/2014  PCP: No primary care provider on file.  Admit date: 11/07/2014 Discharge date: 11/12/2014  Recommendations for Outpatient Follow-up:  1. F/u with cardiology in 1 week for reevaluation 2. Started metoprolol and apixaban  3. Advised him to continue oxygen 2L continuously at home pending further evaluation by his PCP 4. Given Rx for prednisone and azithromycin for COPD exacerbation and started symbicort with albuterol prn 5. Consider referral for pulmonary function tests 6. Urology in 1-2 weeks, evaluation of acute urinary retention and please refer for renal ultrasound to screen for renal cell carcinoma of the left kidney 7. PCP: Will need ultrasound to screen for AAA in 11/2017 8. Home health physical and occupational therapy  Discharge Diagnoses:  Principal Problem:   Atrial fibrillation with RVR Active Problems:   Right hip pain   Acute urinary retention   Acute respiratory failure with hypoxia   Fall   Hypoxia   Nausea and vomiting   CAP (community acquired pneumonia)   COPD with acute exacerbation   Abdominal pain, epigastric   Atrial fibrillation, new onset   Chest pain   RUQ pain   Renal cyst, right   AAA (abdominal aortic aneurysm) without rupture   Ventral hernia   BPH (benign prostatic hyperplasia)   Discharge Condition: Stable, improved  Diet recommendation: Healthy heart  Wt Readings from Last 3 Encounters:  11/12/14 75.887 kg (167 lb 4.8 oz)    History of present illness:   Neil Terry is a 81 y.o. male with history of orthopaedic procedure to his R femur. Patient suffered a mechanical fall and landed on his R hip resulting in severe R hip pain, worse with attempt to move the right hip. He was unable to bear weight on the R hip. Surprisingly on X ray there was no hip fracture. His pain persisted so a CT scan was performed which also showed no fracture. He was admitted  due to inability to walk.  9/7:  Cleared by orthopedics after CT scan negative for WBAT and PT eval 9/8: Progressive SOB and hypoxia. Started on tx for pneumonia, COPD. Also with increasing abdominal pain. CT c/a/p ordered.  9/9: HIDA pending >> had to defer due to acute onset chest pain with a-fib with RVR. Cards and General Surgery consulted. Heparin gtt started 9/10: Stress test deferred to 9/11, feeling better 9/11:  Stress test low risk.  Second episode of a-fib with RVR, this time asymptomatic and resolved quickly with metoprolol.  Started apixaban  Hospital Course:   Right hip pain without obvious fracture on x-ray or CT scan. Despite limited range of motion of the right hip and leukocytosis, I doubt that he had septic arthritis given the tenderness to palpation over the left lateral hip, and pain with attempts at flexion of the knee. His pain gradually improved.  Physical therapy recommended home health physical therapy with 24-hour assistance.  Transitioned to oral pain medication  Acute hypoxic respiratory failure with evidence of bilateral lower lobe "scarring" on CXR. Likely a combination of acute COPD exacerbation and RLL pneumonia/aspiration pneumonia. Markedly improved after starting steroids and breathing treatments.  He was initially started on Solu-Medrol, DuoNeb nebs, budesonide, and Brovana.  His breathing improved however he developed an episode of A. fib with RVR, so medications that could have contributed to his tachycardia were discontinued and he was started on Xopenex instead. Because of concern for aspiration pneumonia, he was started on Unasyn, and he  should continue Augmentin to complete a seven-day course of antibiotics. His HIV PCR was nonreactive. He will complete a prednisone taper.  Recommend outpatient pulmonary function tests if this has not already been completed.   Chest pain due to A. fib with RVR, resolved.  He had severe shortness of breath, chest  tightness, diaphoresis, nausea with his initial episode of A. fib with RVR. His chest pain resolved and his troponins were flat at 0.05.  He was seen by cardiology who recommended echocardiogram and stress test. His ejection fraction was within normal limits and his stress test demonstrated only a very small area of reversible ischemia at the apex. This was considered a low risk study.    New-onset paroxysmal a-fib with RVR, likely triggered by COPD.  CHADS2vasc score 2 based on age. Troponins not felt to represent acute ischemia.  TSH 0.383, within normal limits.  ECHO with EF 60-65%, moderate LVH but normal diastolic parameters.  He was started on metoprolol 12.5 mg by mouth twice a day and Apixaban.   Nonsustained V-tach, 3-12 beats.  Potassium and magnesium wnl.  Ejection fraction was normal. Recommend titrating up his beta blocker as tolerated as an outpatient. He will have close follow-up with cardiology.   RUQ pain Hx of duodenal ulcer and extensive abdominal surgery at [facility] CT abd/pelvis demonstrated possible acute cholecystitis with gallbladder wall thickening and pneumobilia so general surgery was consulted. They recommended right upper quadrant ultrasound which demonstrated no evidence of acute cholecystitis. His abdominal pain started to improve spontaneously. He was also seen by gastroenterology who felt that he should continue PPI but that since he had a normal EGD 8 months ago, that there was no need to repeat this. His liver function tests and lipase were within normal limits. His urinalysis was without obvious infection. He was given an abdominal binder to assist with comfort given his multiple ventral hernias.  Acute urinary retention likely secondary to narcotics. His postvoid residual was greater than 500 mL and had a Foley catheter placed. He was started on Flomax and his narcotics were reduced. His Foley catheter was removed and he was able to void spontaneously before  discharge. He has a urologist whom he follows, and he was advised to get a new appointment in the next 1-2 weeks in part for his urinary retention but also for his left renal cyst.  Leukocytosis, no evidence of pneumonia on chest x-ray, likely due to steroids  Mild hyponatremia stable and asymptomatic  Left renal cyst, possible complete. Needs outpatient RUS which can be ordered by his Urologist  AAA, mild and needs repeat US in 11/2017  Bladder wall thickening likely secondary to BPH and chronic retention. Had TURP last year and has urologist in [place]. F/u with Urology  Consultants:  Orthopedics  Cardiology  Procedures:  Hip x-ray  CT femur right side  CT pelvis  X-ray femur right  Chest x-ray  Echocardiogram  Nuclear medicine stress test  Antibiotics:  Unasyn on 9/8  Discharge Exam: Filed Vitals:   11/12/14 1445  BP: 112/66  Pulse: 74  Temp: 98.9 F (37.2 C)  Resp: 20   Filed Vitals:   11/12/14 1021 11/12/14 1241 11/12/14 1414 11/12/14 1445  BP: 112/53 127/64  112/66  Pulse:    74  Temp:    98.9 F (37.2 C)  TempSrc:    Oral  Resp:    20  Height:      Weight:      SpO2:  98%  95% 96%     General: Adult male, NAD  HEENT: NCAT, MMM  Cardiovascular: RRR, nl S1, S2 no mrg, 2+ pulses, warm extremities   Respiratory: Wheeze, no rales, rhonchi, or increased WOB  Abdomen: NABS, soft, ventral hernias which are reducible, mildly tender to palpation in the epigastrium without rebound or guarding   MSK: Normal tone and bulk, no LEE. Pain with ROM of the right hip, but no swelling  Neuro: Grossly intact  Discharge Instructions      Discharge Instructions    Call MD for:  difficulty breathing, headache or visual disturbances    Complete by:  As directed      Call MD for:  extreme fatigue    Complete by:  As directed      Call MD for:  hives    Complete by:  As directed      Call MD for:  persistant dizziness or  light-headedness    Complete by:  As directed      Call MD for:  persistant nausea and vomiting    Complete by:  As directed      Call MD for:  severe uncontrolled pain    Complete by:  As directed      Call MD for:  temperature >100.4    Complete by:  As directed      Diet - low sodium heart healthy    Complete by:  As directed      Discharge instructions    Complete by:  As directed   You do not have a fracture or other serious problem in your leg.  You may have some sciatica, or nerve-related pain in your leg.  Please talk to your primary care doctor if your pain does not get better in the next few weeks with physical therapy.  You had some abdominal pain, but you did not have any problems with your kidneys, liver, gallbladder, pancreas, or other internal organs.  For your chest pain, you had an ultrasound of your heart which showed no serious problems and your stress test was negative/normal.  You did have a heart rhythm problem called atrial fibrillation.  Please read the information I provided about atrial fibrillation to learn more about it.  This rhythm increases the risk of strokes.  To reduce the risk of stroke, please take apixaban (Eliquis) twice a day.  Eliquis is a blood thinner (that's how it works).  If you have severe bleeding or bleeding that does not stop, please return to the emergency department right away.  You have also been started on metoprolol which will help keep your heart from beating too fast.  The cardiologists would like to reevaluate you in a few days.  If you do not hear from the cardiology office in two days, please call their office to schedule an appointment.  For your breathing, you probably have COPD or chronic bronchitis.  Prednisone and antibiotics and inhalers should make this better.  Talk to your primary care doctor about having additional lung tests done in the next few weeks.  You had a small abdominal aneurysm, which is an enlargement of the blood vessel in  the belly.  You will need an ultrasound of this aneurysm in 3 year (September 2019).  You also had a cyst on your left kidney which will need to be reevaluated by your urologist with an ultrasound or by MRI in the next 1-2 weeks.  Please call your urologist to get this test arranged  right away.     Increase activity slowly    Complete by:  As directed             Medication List    TAKE these medications        albuterol 108 (90 BASE) MCG/ACT inhaler  Commonly known as:  PROVENTIL HFA;VENTOLIN HFA  Inhale 2 puffs into the lungs every 6 (six) hours as needed for wheezing or shortness of breath.     amoxicillin-clavulanate 875-125 MG per tablet  Commonly known as:  AUGMENTIN  Take 1 tablet by mouth 2 (two) times daily.     apixaban 5 MG Tabs tablet  Commonly known as:  ELIQUIS  Take 1 tablet (5 mg total) by mouth 2 (two) times daily.     budesonide-formoterol 160-4.5 MCG/ACT inhaler  Commonly known as:  SYMBICORT  Inhale 2 puffs into the lungs 2 (two) times daily.     metoprolol tartrate 25 MG tablet  Commonly known as:  LOPRESSOR  Take 0.5 tablets (12.5 mg total) by mouth 2 (two) times daily.     oxyCODONE 5 MG immediate release tablet  Commonly known as:  Oxy IR/ROXICODONE  Take 1 tablet (5 mg total) by mouth every 4 (four) hours as needed for moderate pain or severe pain.     pantoprazole 40 MG tablet  Commonly known as:  PROTONIX  Take 1 tablet (40 mg total) by mouth daily at 6 (six) AM.     predniSONE 20 MG tablet  Commonly known as:  DELTASONE  Take 2 tabs on day 1, 1 tab on day 2, and a half tab on days 3 and 4, then stop.     tamsulosin 0.4 MG Caps capsule  Commonly known as:  FLOMAX  Take 1 capsule (0.4 mg total) by mouth daily.     tobramycin-dexamethasone ophthalmic solution  Commonly known as:  TOBRADEX  Place 1 drop into both eyes every 4 (four) hours while awake.       Follow-up Information    Follow up with MURPHY, TIMOTHY D, MD In 2 weeks.    Specialty:  Orthopedic Surgery   Contact information:   [address] [place] [address] [phone]       Follow up with [facility].   Contact information:   [address] [place] [address] [phone]       Follow up with [facility]. Schedule an appointment as soon as possible for a visit in 1 week.   Contact information:   [address] [place] [address] [phone]       The results of significant diagnostics from this hospitalization (including imaging, microbiology, ancillary and laboratory) are listed below for reference.    Significant Diagnostic Studies: Ct Chest W Contrast  11/09/2014   CLINICAL DATA:  acute respiratory failure, hypoxia and pulmonary infiltrates seen on CXR.  EXAM: CT CHEST, ABDOMEN, AND PELVIS WITH CONTRAST  TECHNIQUE: Multidetector CT imaging of the chest, abdomen and pelvis was performed following the standard protocol during bolus administration of intravenous contrast.  CONTRAST:  OMNIPAQUE IOHEXOL 300 MG/ML  SOLN  COMPARISON:  None.  FINDINGS: CT CHEST FINDINGS  Mediastinum/Nodes: Mild cardiac enlargement. No pericardial effusion. Patulous esophagus containing oral contrast.  No significant hilar or mediastinal adenopathy. Thoracic aorta shows mild calcification.  Lungs/Pleura: Study suffers from mild motion artifact. Mild dependent infiltrate bilaterally likely atelectasis. Mild atelectasis inferior right middle lobe  and inferior lingula. Trace pleural effusions.  Musculoskeletal: Deformity surgical neck right humerus partially visualized without acute fracture line. Correlate for history of prior trauma. Compression deformities throughout the thoracic spine ranging from mild 2 moderate severity. No retropulsion causing significant canal narrowing.  CT ABDOMEN PELVIS FINDINGS  Hepatobiliary: 1.5 cm cyst lateral left lobe  of liver. Trace pneumobilia left lobe of liver. Minimal thickening of gallbladder wall with gallbladder wall enhancement. No gallstones identified. Common bile duct normal at 6 mm.  Pancreas: Normal  Spleen: Normal  Adrenals/Urinary Tract: Mild dilatation right ureter. Moderate dilatation left ureter. Bilateral dilatation of the renal pelvises. Normal bilateral renal enhancement. Exophytic 1.8 cm lesion midpole left kidney average attenuation of 27 possibly a complicated cyst. Mild right renal vascular calcification. Foley balloon catheter noted. Severe bladder wall thickening diffusely. A few foci of air are seen near the dome of the bladder.  Stomach/Bowel: Status post gastrojejunostomy. Dehiscent midline abdominal wall musculature. Numerous loops of small and large bowel are involved with this.Many loops show narrowing at the hernia site, but there is no surrounding inflammatory change or wall edema, nor pneumatosis, to indicate strangulation. There is a tiny focus of gas in the anterior abdominal wall to the left of the hernia, image 95, which could be related to recent medication injection, and is not clearly related to the hernia other than by proximity.  Vascular/Lymphatic: Atherosclerotic calcification abdominal aorta. Minimal aneurysmal dilatation infrarenal aorta to 3 cm. Mild iliac artery dilatation to 17 mm on the left and 15 mm on the right.  Reproductive: Calcification within the prostate  Other: No ascites  Musculoskeletal: Status post kyphoplasty L1, L2 and L3 with compression deformities at these levels. Mild L4 compression deformity as well. Medullary nail right femur. Evidence of prior a dynamic hip screw left femur. Severe diffuse osteopenia.  IMPRESSION: 1. Dependent infiltrate right lower lobe, more likely due to atelectasis rather than pneumonia. Follow up recommended.  2.Trace left hepatic pneumobilia, etiology uncertain. No visualized pneumatosis involving bowel.  3. Left renal lesion may be  complex cyst but mass not excluded. Consider renal ultrasound.  4. Prior gastrojejunostomy. Numerous loops of small and large bowel involved with anterior abdominal wall hernia. No evidence of obstruction or strangulation. Tiny focus of subcutaneous air to left of hernia may be a medication injection site.  5. Severe bladder wall thickening causing mild to moderate hydronephrosis; differential includes hypertrophy, malignancy, and cystitis  6. Mild abdominal aortic aneurysm. Recommend followup by ultrasound in 3 years. This recommendation follows ACR consensus guidelines: White Paper of the ACR Incidental Findings Committee II on Vascular Findings. J Am Coll Radiol 2013; 16:109-604  7. Severe diffuse osteoporosis with numerous spinal compression deformities, status post kyphoplasty at multiple levels, and evidence of prior trauma to femurs and right humerus.  8. Minimal thickening and enhancement of the gallbladder wall, may be incidental. Consider right upper quadrant ultrasound if symptomatic.  9.  Other nonacute findings as described above.   Electronically Signed   By: Esperanza Heir M.D.   On: 11/09/2014 16:39   Ct Pelvis Wo Contrast  11/07/2014   CLINICAL DATA:  Right hip pain after a fall today.  EXAM: CT PELVIS AND RIGHT LOWER EXTREMITY WITHOUT CONTRAST  TECHNIQUE: Multidetector CT imaging of the pelvis and right lower extremity was performed according to the standard protocol without intravenous contrast. Multiplanar CT image reconstructions were also generated.  COMPARISON:  Pelvis and right hip radiographs 11/07/2014  FINDINGS: CT PELVIS FINDINGS  Diffuse bone  demineralization. The sacrum and SI joints appear intact and symmetrical. Pelvis appears intact. No acute displaced fractures identified. Symphysis pubis is not displaced. No focal bone lesion or bone destruction. Incidental note of cement at L3 consistent with prior kyphoplasty. Compression of the superior and inferior endplates at L4. Changes  are likely due to osteoporosis.  Soft tissue pelvis demonstrates vascular calcification in the abdominal aorta. There are multiple ventral abdominal wall hernias containing colon and small bowel. No visualized bowel obstruction. Bladder wall is mildly thickened which may be due to cystitis or hypertrophy from outlet obstruction. Prostate gland is enlarged and prostate calcifications are present.  CT RIGHT LOWER EXTREMITY FINDINGS  Diffuse bone demineralization. Previous intra medullary rod fixation of the femur with distal locking screw. No evidence of displacement of hardware components. Old healed fracture deformity of the inter trochanteric region. No evidence of acute fracture or dislocation of the right femur. No focal bone lesion or bone destruction. No significant effusion at the knee. Soft tissues of the right leg demonstrate diffuse muscular fatty atrophy. Scattered vascular calcifications in the superficial femoral artery. No focal fluid collection or hematoma.  IMPRESSION: Diffuse bone demineralization. Lower lumbar compression fractures consistent with osteoporosis. No acute fractures demonstrated in the pelvis or right femur. Previous intra medullary rod fixation of the right femur with and old healed fracture of the inter trochanteric region. Diffuse fatty atrophy of the right leg muscles.   Electronically Signed   By: Burman Nieves M.D.   On: 11/07/2014 22:16   Ct Abdomen Pelvis W Contrast  11/09/2014   CLINICAL DATA:  acute respiratory failure, hypoxia and pulmonary infiltrates seen on CXR.  EXAM: CT CHEST, ABDOMEN, AND PELVIS WITH CONTRAST  TECHNIQUE: Multidetector CT imaging of the chest, abdomen and pelvis was performed following the standard protocol during bolus administration of intravenous contrast.  CONTRAST:  OMNIPAQUE IOHEXOL 300 MG/ML  SOLN  COMPARISON:  None.  FINDINGS: CT CHEST FINDINGS  Mediastinum/Nodes: Mild cardiac enlargement. No pericardial effusion. Patulous esophagus  containing oral contrast.  No significant hilar or mediastinal adenopathy. Thoracic aorta shows mild calcification.  Lungs/Pleura: Study suffers from mild motion artifact. Mild dependent infiltrate bilaterally likely atelectasis. Mild atelectasis inferior right middle lobe and inferior lingula. Trace pleural effusions.  Musculoskeletal: Deformity surgical neck right humerus partially visualized without acute fracture line. Correlate for history of prior trauma. Compression deformities throughout the thoracic spine ranging from mild 2 moderate severity. No retropulsion causing significant canal narrowing.  CT ABDOMEN PELVIS FINDINGS  Hepatobiliary: 1.5 cm cyst lateral left lobe of liver. Trace pneumobilia left lobe of liver. Minimal thickening of gallbladder wall with gallbladder wall enhancement. No gallstones identified. Common bile duct normal at 6 mm.  Pancreas: Normal  Spleen: Normal  Adrenals/Urinary Tract: Mild dilatation right ureter. Moderate dilatation left ureter. Bilateral dilatation of the renal pelvises. Normal bilateral renal enhancement. Exophytic 1.8 cm lesion midpole left kidney average attenuation of 27 possibly a complicated cyst. Mild right renal vascular calcification. Foley balloon catheter noted. Severe bladder wall thickening diffusely. A few foci of air are seen near the dome of the bladder.  Stomach/Bowel: Status post gastrojejunostomy. Dehiscent midline abdominal wall musculature. Numerous loops of small and large bowel are involved with this.Many loops show narrowing at the hernia site, but there is no surrounding inflammatory change or wall edema, nor pneumatosis, to indicate strangulation. There is a tiny focus of gas in the anterior abdominal wall to the left of the hernia, image 95, which could be related  to recent medication injection, and is not clearly related to the hernia other than by proximity.  Vascular/Lymphatic: Atherosclerotic calcification abdominal aorta. Minimal  aneurysmal dilatation infrarenal aorta to 3 cm. Mild iliac artery dilatation to 17 mm on the left and 15 mm on the right.  Reproductive: Calcification within the prostate  Other: No ascites  Musculoskeletal: Status post kyphoplasty L1, L2 and L3 with compression deformities at these levels. Mild L4 compression deformity as well. Medullary nail right femur. Evidence of prior a dynamic hip screw left femur. Severe diffuse osteopenia.  IMPRESSION: 1. Dependent infiltrate right lower lobe, more likely due to atelectasis rather than pneumonia. Follow up recommended.  2.Trace left hepatic pneumobilia, etiology uncertain. No visualized pneumatosis involving bowel.  3. Left renal lesion may be complex cyst but mass not excluded. Consider renal ultrasound.  4. Prior gastrojejunostomy. Numerous loops of small and large bowel involved with anterior abdominal wall hernia. No evidence of obstruction or strangulation. Tiny focus of subcutaneous air to left of hernia may be a medication injection site.  5. Severe bladder wall thickening causing mild to moderate hydronephrosis; differential includes hypertrophy, malignancy, and cystitis  6. Mild abdominal aortic aneurysm. Recommend followup by ultrasound in 3 years. This recommendation follows ACR consensus guidelines: White Paper of the ACR Incidental Findings Committee II on Vascular Findings. J Am Coll Radiol 2013; 13:244-010  7. Severe diffuse osteoporosis with numerous spinal compression deformities, status post kyphoplasty at multiple levels, and evidence of prior trauma to femurs and right humerus.  8. Minimal thickening and enhancement of the gallbladder wall, may be incidental. Consider right upper quadrant ultrasound if symptomatic.  9.  Other nonacute findings as described above.   Electronically Signed   By: Esperanza Heir M.D.   On: 11/09/2014 16:39   Ct Femur Right Wo Contrast  11/07/2014   CLINICAL DATA:  Right hip pain after a fall today.  EXAM: CT PELVIS AND  RIGHT LOWER EXTREMITY WITHOUT CONTRAST  TECHNIQUE: Multidetector CT imaging of the pelvis and right lower extremity was performed according to the standard protocol without intravenous contrast. Multiplanar CT image reconstructions were also generated.  COMPARISON:  Pelvis and right hip radiographs 11/07/2014  FINDINGS: CT PELVIS FINDINGS  Diffuse bone demineralization. The sacrum and SI joints appear intact and symmetrical. Pelvis appears intact. No acute displaced fractures identified. Symphysis pubis is not displaced. No focal bone lesion or bone destruction. Incidental note of cement at L3 consistent with prior kyphoplasty. Compression of the superior and inferior endplates at L4. Changes are likely due to osteoporosis.  Soft tissue pelvis demonstrates vascular calcification in the abdominal aorta. There are multiple ventral abdominal wall hernias containing colon and small bowel. No visualized bowel obstruction. Bladder wall is mildly thickened which may be due to cystitis or hypertrophy from outlet obstruction. Prostate gland is enlarged and prostate calcifications are present.  CT RIGHT LOWER EXTREMITY FINDINGS  Diffuse bone demineralization. Previous intra medullary rod fixation of the femur with distal locking screw. No evidence of displacement of hardware components. Old healed fracture deformity of the inter trochanteric region. No evidence of acute fracture or dislocation of the right femur. No focal bone lesion or bone destruction. No significant effusion at the knee. Soft tissues of the right leg demonstrate diffuse muscular fatty atrophy. Scattered vascular calcifications in the superficial femoral artery. No focal fluid collection or hematoma.  IMPRESSION: Diffuse bone demineralization. Lower lumbar compression fractures consistent with osteoporosis. No acute fractures demonstrated in the pelvis or right femur. Previous intra  medullary rod fixation of the right femur with and old healed fracture of  the inter trochanteric region. Diffuse fatty atrophy of the right leg muscles.   Electronically Signed   By: Burman Nieves M.D.   On: 11/07/2014 22:16   Nm Myocar Multi W/spect W/wall Motion / Ef  11/12/2014   CLINICAL DATA:  Atrial fibrillation and chest pain.  EXAM: MYOCARDIAL IMAGING WITH SPECT (REST AND PHARMACOLOGIC-STRESS)  GATED LEFT VENTRICULAR WALL MOTION STUDY  LEFT VENTRICULAR EJECTION FRACTION  TECHNIQUE: Standard myocardial SPECT imaging was performed after resting intravenous injection of 10 mCi Tc-75m sestamibi. Subsequently, intravenous infusion of Lexiscan was performed under the supervision of the Cardiology staff. At peak effect of the drug, 30 mCi Tc-50m sestamibi was injected intravenously and standard myocardial SPECT imaging was performed. Quantitative gated imaging was also performed to evaluate left ventricular wall motion, and estimate left ventricular ejection fraction.  COMPARISON:  None.  FINDINGS: Perfusion: Small reversible defect noted within the anterior apex.  Wall Motion: Normal left ventricular wall motion. No left ventricular dilation.  Left Ventricular Ejection Fraction: 68 %  End diastolic volume 89 ml  End systolic volume 29 ml  IMPRESSION: 1. Single small reversible defect noted within the anterior apex.  2. Normal left ventricular wall motion.  3. Left ventricular ejection fraction 68%  4. Low-risk stress test findings*.  *2012 Appropriate Use Criteria for Coronary Revascularization Focused Update: J Am Coll Cardiol. 2012;59(9):857-881. http://content.dementiazones.com.aspx?articleid=1201161  These results will be called to the ordering clinician or representative by the Radiologist Assistant, and communication documented in the PACS or zVision Dashboard.   Electronically Signed   By: Signa Kell M.D.   On: 11/12/2014 12:47   US Abdomen Limited  11/10/2014   CLINICAL DATA:  Acute right upper quadrant abdominal pain.  EXAM: US ABDOMEN LIMITED - RIGHT UPPER  QUADRANT  COMPARISON:  None.  FINDINGS: Gallbladder:  No gallstones or wall thickening visualized. No sonographic Murphy sign noted.  Common bile duct:  Diameter: 6.1 mm which is within normal limits.  Liver:  No focal lesion identified. Within normal limits in parenchymal echogenicity.  IMPRESSION: No abnormality seen in the right upper quadrant of the abdomen.   Electronically Signed   By: Lupita Raider, M.D.   On: 11/10/2014 21:30   Dg Chest Port 1 View  11/08/2014   CLINICAL DATA:  Patient with hypoxia.  EXAM: PORTABLE CHEST - 1 VIEW  COMPARISON:  Chest radiograph 09/17/2005  FINDINGS: Monitoring leads overlie the patient. Stable enlarged cardiac and mediastinal contours. Tortuosity of the thoracic aorta. Elevation right hemidiaphragm. No consolidative pulmonary opacities. Chronic bilateral predominantly lower lung interstitial opacities, likely represent scarring. Abnormal appearance to the proximal humerus/humeral head, incompletely visualized.  IMPRESSION: Abnormal appearance to the proximal humerus/humeral which is incompletely visualized. Recommend dedicated views as this may potentially represent degenerative changes, abnormal appearance from positioning, old fracture or acute injury.  No acute cardiopulmonary process.   Electronically Signed   By: Annia Belt M.D.   On: 11/08/2014 11:30   Dg Hip Unilat With Pelvis 2-3 Views Right  11/07/2014   CLINICAL DATA:  Status post fall today with right-sided hip pain, history of hip fracture 6 years ago.  EXAM: DG HIP (WITH OR WITHOUT PELVIS) 2-3V RIGHT  COMPARISON:  None.  FINDINGS: The bones are osteopenic. There is old deformity of the intertrochanteric region of the right hip. The femoral head is intact. The metallic rod is in reasonable position. The observed portions of the right  hemipelvis exhibit no acute fracture. There is chronic deformity of the superior pubic ramus on the left.  IMPRESSION: There is no acute bony abnormality of the right hip or  the visualized portions of the right hemipelvis.   Electronically Signed   By: David  [place] M.D.   On: 11/07/2014 17:25   Dg Femur, Min 2 Views Right  11/08/2014   CLINICAL DATA:  81 year old male unable to bear weight on the right leg.  EXAM: RIGHT FEMUR 2 VIEWS  COMPARISON:  CT of the right lower extremity dated 11/07/2014  FINDINGS: An intra medullary rod is noted in the right femur. There is old healed fracture of the right intertrochanteric region. The bones are osteopenic. No acute fracture identified.  IMPRESSION: No acute fracture or dislocation. Right femoral intra medullary rod.   Electronically Signed   By: Elgie Collard M.D.   On: 11/08/2014 02:01    Microbiology: Recent Results (from the past 240 hour(s))  Culture, Urine     Status: None   Collection Time: 11/08/14  4:08 PM  Result Value Ref Range Status   Specimen Description URINE, RANDOM  Final   Special Requests NONE  Final   Culture NO GROWTH 1 DAY  Final   Report Status 11/09/2014 FINAL  Final  Culture, blood (routine x 2)     Status: None (Preliminary result)   Collection Time: 11/09/14 11:15 AM  Result Value Ref Range Status   Specimen Description BLOOD RIGHT ARM  Final   Special Requests BOTTLES DRAWN AEROBIC AND ANAEROBIC 10CC  Final   Culture NO GROWTH 3 DAYS  Final   Report Status PENDING  Incomplete  Culture, blood (routine x 2)     Status: None (Preliminary result)   Collection Time: 11/09/14 11:25 AM  Result Value Ref Range Status   Specimen Description BLOOD LEFT ARM  Final   Special Requests BOTTLES DRAWN AEROBIC AND ANAEROBIC 5CC  Final   Culture NO GROWTH 3 DAYS  Final   Report Status PENDING  Incomplete     Labs: Basic Metabolic Panel:  Recent Labs Lab 11/07/14 1945 11/09/14 0530 11/10/14 0520 11/10/14 1220 11/11/14 0351 11/12/14 0353  NA 135 133* 134*  --  134* 138  K 3.8 4.2 4.5  --  4.1 4.1  CL 98* 96* 99*  --  100* 105  CO2 25 32 29  --  29 26  GLUCOSE 85 95 185*  --  148* 117*   BUN 13 19 10   --  12 12  CREATININE 0.95 1.16 0.81  --  0.91 0.80  CALCIUM 9.1 8.2* 8.5*  --  8.1* 8.0*  MG  --   --   --  2.0  --   --    Liver Function Tests:  Recent Labs Lab 11/07/14 1945  AST 26  ALT 14*  ALKPHOS 89  BILITOT 0.9  PROT 8.2*  ALBUMIN 4.1    Recent Labs Lab 11/08/14 1110  LIPASE 17*   No results for input(s): AMMONIA in the last 168 hours. CBC:  Recent Labs Lab 11/07/14 1945 11/09/14 0530 11/10/14 0520 11/11/14 0351 11/12/14 0353  WBC 15.1* 9.1 9.1 13.0* 12.6*  NEUTROABS 13.1*  --   --   --   --   HGB 16.1 12.9* 13.2 12.5* 12.3*  HCT 49.1 41.1 40.0 38.0* 37.1*  MCV 89.6 91.5 89.3 89.4 89.0  PLT 225 176 173 188 170   Cardiac Enzymes:  Recent Labs Lab 11/10/14 1220 11/10/14 2101  11/10/14 2357  TROPONINI <0.03 0.05* 0.05*   BNP: BNP (last 3 results)  Recent Labs  11/09/14 1125  BNP 172.8*    ProBNP (last 3 results) No results for input(s): PROBNP in the last 8760 hours.  CBG: No results for input(s): GLUCAP in the last 168 hours.  Time coordinating discharge: 35 minutes  Signed:  Kirbie Stodghill  [facility] 11/12/2014, 3:55 PM

## 2014-11-12 NOTE — Care Management Note (Signed)
Case Management Note  Patient Details  Name: Neil Terry MRN: [id number] Date of Birth: 1927-10-05  Subjective/Objective:                  Fall, Right hip pain  Action/Plan: Discharge home with home health   Expected Discharge Date:   11/13/14           Expected Discharge Plan:   Home with Home Health Services  In-House Referral:     Discharge planning Services  CM Consult  Post Acute Care Choice:  Home Health Choice offered to:     DME Arranged:  3-N-1 DME Agency:  [facility]  HH Arranged:    [facility] Agency:     Status of Service:  In process, will continue to follow  Medicare Important Message Given:    Date Medicare IM Given:    Medicare IM give by:    Date Additional Medicare IM Given:    Additional Medicare Important Message give by:     If discussed at Long Length of Stay Meetings, dates discussed:    Additional Comments: CM spoke with patient at the bedside. Patient has difficulty understanding [place]. Home Health Care list left in the room for family to review with patient when they arrive. Patient's nurse inform family of the list. [name] at [facility] notified of 3N1 order and will deliver to patient's room.  Antony Haste, RN 11/12/2014, 12:00 PM

## 2014-11-12 NOTE — Progress Notes (Signed)
PT Cancellation Note  Patient Details Name: Neil Terry MRN: [id number] DOB: [birth year]   Cancelled Treatment:    Reason Eval/Treat Not Completed: Patient at procedure or test/unavailable   Herta Hink 11/12/2014, 8:27 AM  Skip Mayer PT [phone]

## 2014-11-12 NOTE — Progress Notes (Signed)
Pt returned from stress test.  While helping pt to bsc, pt hr in 140's, asymptomatic.  12 bt run of vtach noted on the monitor.  Pt returned to bed and EKG obtained, vss, pt back in Afib.  Carlean Jews notified, no new orders received.  Will continue to monitor closely.

## 2014-11-12 NOTE — Consult Note (Signed)
ANTIBIOTIC CONSULT NOTE - Follow-Up  Pharmacy Consult for Unasyn Indication: pneumonia  No Known Allergies    Vital Signs: Temp: 98.1 F (36.7 C) (09/11 0332) Temp Source: Oral (09/11 0332) BP: 129/67 mmHg (09/11 0919) Pulse Rate: 67 (09/11 0332) Intake/Output from previous day: 09/10 0701 - 09/11 0700 In: 800 [P.O.:800] Out: 3125 [Urine:3125] Intake/Output from this shift: Total I/O In: -  Out: 350 [Urine:350]  Labs:  Recent Labs  11/10/14 0520 11/11/14 0351 11/12/14 0353  WBC 9.1 13.0* 12.6*  HGB 13.2 12.5* 12.3*  PLT 173 188 170  CREATININE 0.81 0.91 0.80   Estimated Creatinine Clearance: 62 mL/min (by C-G formula based on Cr of 0.8). No results for input(s): VANCOTROUGH, VANCOPEAK, VANCORANDOM, GENTTROUGH, GENTPEAK, GENTRANDOM, TOBRATROUGH, TOBRAPEAK, TOBRARND, AMIKACINPEAK, AMIKACINTROU, AMIKACIN in the last 72 hours.   Microbiology: Recent Results (from the past 720 hour(s))  Culture, Urine     Status: None   Collection Time: 11/08/14  4:08 PM  Result Value Ref Range Status   Specimen Description URINE, RANDOM  Final   Special Requests NONE  Final   Culture NO GROWTH 1 DAY  Final   Report Status 11/09/2014 FINAL  Final  Culture, blood (routine x 2)     Status: None (Preliminary result)   Collection Time: 11/09/14 11:15 AM  Result Value Ref Range Status   Specimen Description BLOOD RIGHT ARM  Final   Special Requests BOTTLES DRAWN AEROBIC AND ANAEROBIC 10CC  Final   Culture NO GROWTH 2 DAYS  Final   Report Status PENDING  Incomplete  Culture, blood (routine x 2)     Status: None (Preliminary result)   Collection Time: 11/09/14 11:25 AM  Result Value Ref Range Status   Specimen Description BLOOD LEFT ARM  Final   Special Requests BOTTLES DRAWN AEROBIC AND ANAEROBIC 5CC  Final   Culture NO GROWTH 2 DAYS  Final   Report Status PENDING  Incomplete    Medical History: Past Medical History  Diagnosis Date  . Duodenal ulcer     perforated ~ 2013  .  COPD (chronic obstructive pulmonary disease)   . GERD (gastroesophageal reflux disease)   . HTN (hypertension)   . BPH (benign prostatic hypertrophy)   . Bronchial asthma   . On home oxygen therapy     "8h/day; don't know how many liters"  . Depression     hx    Assessment: 81 yo male w/ PMH joint replacement presenting after a fall with right hip pain. Now being treated for aspiration PNA w/ intermittent hypoxia. Currently on Day #4 of abx.  WBC 12.6, afebrile  Unasyn 9/8 >> Azithromycin 9/8 >> 9/10  9/8 BCx x2 - NGx2d 9/7 UCx -NG  Plan:  Continue Unasyn 1.5 gm q6h Monitor clinical status, culture results, LOT  Neil Terry, PharmD Clinical Pharmacy Resident Pager: [phone]  11/12/2014 9:50 AM

## 2014-11-14 LAB — CULTURE, BLOOD (ROUTINE X 2)
Culture: NO GROWTH
Culture: NO GROWTH

## 2014-11-22 DIAGNOSIS — Z8701 Personal history of pneumonia (recurrent): Secondary | ICD-10-CM | POA: Diagnosis not present

## 2014-11-22 DIAGNOSIS — R339 Retention of urine, unspecified: Secondary | ICD-10-CM | POA: Diagnosis not present

## 2014-11-22 DIAGNOSIS — M25551 Pain in right hip: Secondary | ICD-10-CM | POA: Diagnosis not present

## 2014-11-22 DIAGNOSIS — Z9181 History of falling: Secondary | ICD-10-CM | POA: Diagnosis not present

## 2014-11-22 DIAGNOSIS — Z96641 Presence of right artificial hip joint: Secondary | ICD-10-CM | POA: Diagnosis not present

## 2014-11-22 DIAGNOSIS — J449 Chronic obstructive pulmonary disease, unspecified: Secondary | ICD-10-CM | POA: Diagnosis not present

## 2014-11-22 DIAGNOSIS — I4891 Unspecified atrial fibrillation: Secondary | ICD-10-CM | POA: Diagnosis not present

## 2014-11-24 ENCOUNTER — Encounter: Payer: Self-pay | Admitting: Cardiology

## 2014-11-24 ENCOUNTER — Ambulatory Visit (INDEPENDENT_AMBULATORY_CARE_PROVIDER_SITE_OTHER): Payer: Medicare Other | Admitting: Cardiology

## 2014-11-24 VITALS — BP 116/72 | HR 63 | Ht 60.0 in | Wt 164.2 lb

## 2014-11-24 DIAGNOSIS — M25551 Pain in right hip: Secondary | ICD-10-CM | POA: Diagnosis not present

## 2014-11-24 DIAGNOSIS — R339 Retention of urine, unspecified: Secondary | ICD-10-CM | POA: Diagnosis not present

## 2014-11-24 DIAGNOSIS — Z7901 Long term (current) use of anticoagulants: Secondary | ICD-10-CM | POA: Diagnosis not present

## 2014-11-24 DIAGNOSIS — I4891 Unspecified atrial fibrillation: Secondary | ICD-10-CM | POA: Diagnosis not present

## 2014-11-24 DIAGNOSIS — Z96641 Presence of right artificial hip joint: Secondary | ICD-10-CM | POA: Diagnosis not present

## 2014-11-24 DIAGNOSIS — Z9181 History of falling: Secondary | ICD-10-CM | POA: Diagnosis not present

## 2014-11-24 DIAGNOSIS — J449 Chronic obstructive pulmonary disease, unspecified: Secondary | ICD-10-CM | POA: Diagnosis not present

## 2014-11-24 NOTE — Assessment & Plan Note (Signed)
Eliquis 

## 2014-11-24 NOTE — Progress Notes (Signed)
11/24/2014 Neil Terry   [birth year]  [id number]  Primary Physician No primary care provider on file. Primary Cardiologist: Dr Rennis Golden  HPI:  81 y.o. male with relatively little known past medical history other than prior orthopedic procedure to his right femur and apparently prior abdominal surgery. Pt suffered a mechanical fall at home on 11/07/2014 landing on his right hip, the same hip that had been previously operated on years ago. Marland Kitchen He was evaluated by orthopedics who did not see a clear cause of his pain other than musculoskeletal. He then developed acute hypoxic respiratory failure the following day. He was started on treatment for pneumonia and COPD exacerbation. He was also complaining of increasing abdominal pain and a CT scan of the chest, abdomen and pelvis was ordered. This demonstrated possible acute cholecystitis with gallbladder wall thickening and pneumobilia.  He then developed new onset A. fib with RVR and was experiencing chest pain. EKG showed ST depression in the inferior and lateral leads associated with tachycardia. Troponin was negative. He had an echo and Myoview with results as noted below. He converted spontaneously to NSR. He is in the office today for follow up. His son accompanied him to interpret. The pt is still using a walker. No further falls. No tachycardia.    Current Outpatient Prescriptions  Medication Sig Dispense Refill  . albuterol (PROVENTIL HFA;VENTOLIN HFA) 108 (90 BASE) MCG/ACT inhaler Inhale 2 puffs into the lungs every 6 (six) hours as needed for wheezing or shortness of breath. 1 Inhaler 0  . apixaban (ELIQUIS) 5 MG TABS tablet Take 1 tablet (5 mg total) by mouth 2 (two) times daily. 60 tablet 0  . budesonide-formoterol (SYMBICORT) 160-4.5 MCG/ACT inhaler Inhale 2 puffs into the lungs 2 (two) times daily. 10.2 g 0  . metoprolol tartrate (LOPRESSOR) 25 MG tablet Take 0.5 tablets (12.5 mg total) by mouth 2 (two) times daily. 60 tablet 0  . oxyCODONE  (OXY IR/ROXICODONE) 5 MG immediate release tablet Take 1 tablet (5 mg total) by mouth every 4 (four) hours as needed for moderate pain or severe pain. 20 tablet 0  . pantoprazole (PROTONIX) 40 MG tablet Take 1 tablet (40 mg total) by mouth daily at 6 (six) AM. 30 tablet 0  . tamsulosin (FLOMAX) 0.4 MG CAPS capsule Take 1 capsule (0.4 mg total) by mouth daily. 30 capsule 0   No current facility-administered medications for this visit.    No Known Allergies  Social History   Social History  . Marital Status: Widowed    Spouse Name: N/A  . Number of Children: N/A  . Years of Education: N/A   Occupational History  . Not on file.   Social History Main Topics  . Smoking status: Former Smoker -- 3.00 packs/day for 50 years    Types: Cigarettes  . Smokeless tobacco: Never Used     Comment: "quit smoking cigarettes in the 1990's"  . Alcohol Use: No  . Drug Use: No  . Sexual Activity: Not on file   Other Topics Concern  . Not on file   Social History Narrative     Review of Systems: General: negative for chills, fever, night sweats or weight changes.  Cardiovascular: negative for chest pain, dyspnea on exertion, edema, orthopnea, palpitations, paroxysmal nocturnal dyspnea or shortness of breath Dermatological: negative for rash Respiratory: negative for cough or wheezing Urologic: negative for hematuria Abdominal: negative for nausea, vomiting, diarrhea, bright red blood per rectum, melena, or hematemesis Neurologic: negative for visual changes,  syncope, or dizziness All other systems reviewed and are otherwise negative except as noted above.    Blood pressure 116/72, pulse 63, height 5' (1.524 m), weight 164 lb 3.2 oz (74.481 kg).  General appearance: alert, cooperative, no distress and frail, kyphotic Lungs: clear to auscultation bilaterally Heart: regular rate and rhythm Skin: pale, cool, dry Neurologic: Grossly normal  EKG NSR, 63  ASSESSMENT AND PLAN:   Atrial  fibrillation, new onset Picked up while pt was hospitalized after a fall. Echo normal LVF and LA size, Myoview low risk CHADS VASc= 2 for age and HTN  Chronic anticoagulation Eliquis   PLAN  Same Rx, f/u in 6 months. If he has recurrent falls at home will have to reconsider anticoagulation.   Corine Shelter K PA-C 11/24/2014 3:28 PM

## 2014-11-24 NOTE — Patient Instructions (Signed)
Your physician recommends that you schedule a follow-up appointment in: 6 months Dr. Rennis Golden

## 2014-11-24 NOTE — Assessment & Plan Note (Signed)
Picked up while pt was hospitalized after a fall. Echo normal LVF and LA size, Myoview low risk CHADS VASc= 2 for age and HTN

## 2014-11-28 DIAGNOSIS — Z9181 History of falling: Secondary | ICD-10-CM | POA: Diagnosis not present

## 2014-11-28 DIAGNOSIS — J449 Chronic obstructive pulmonary disease, unspecified: Secondary | ICD-10-CM | POA: Diagnosis not present

## 2014-11-28 DIAGNOSIS — Z96641 Presence of right artificial hip joint: Secondary | ICD-10-CM | POA: Diagnosis not present

## 2014-11-28 DIAGNOSIS — I4891 Unspecified atrial fibrillation: Secondary | ICD-10-CM | POA: Diagnosis not present

## 2014-11-28 DIAGNOSIS — M25551 Pain in right hip: Secondary | ICD-10-CM | POA: Diagnosis not present

## 2014-11-28 DIAGNOSIS — R339 Retention of urine, unspecified: Secondary | ICD-10-CM | POA: Diagnosis not present

## 2014-11-30 DIAGNOSIS — Z96641 Presence of right artificial hip joint: Secondary | ICD-10-CM | POA: Diagnosis not present

## 2014-11-30 DIAGNOSIS — R339 Retention of urine, unspecified: Secondary | ICD-10-CM | POA: Diagnosis not present

## 2014-11-30 DIAGNOSIS — Z9181 History of falling: Secondary | ICD-10-CM | POA: Diagnosis not present

## 2014-11-30 DIAGNOSIS — I4891 Unspecified atrial fibrillation: Secondary | ICD-10-CM | POA: Diagnosis not present

## 2014-11-30 DIAGNOSIS — M25551 Pain in right hip: Secondary | ICD-10-CM | POA: Diagnosis not present

## 2014-11-30 DIAGNOSIS — J449 Chronic obstructive pulmonary disease, unspecified: Secondary | ICD-10-CM | POA: Diagnosis not present

## 2014-12-01 DIAGNOSIS — J449 Chronic obstructive pulmonary disease, unspecified: Secondary | ICD-10-CM | POA: Diagnosis not present

## 2014-12-01 DIAGNOSIS — R339 Retention of urine, unspecified: Secondary | ICD-10-CM | POA: Diagnosis not present

## 2014-12-01 DIAGNOSIS — Z9181 History of falling: Secondary | ICD-10-CM | POA: Diagnosis not present

## 2014-12-01 DIAGNOSIS — Z96641 Presence of right artificial hip joint: Secondary | ICD-10-CM | POA: Diagnosis not present

## 2014-12-01 DIAGNOSIS — I4891 Unspecified atrial fibrillation: Secondary | ICD-10-CM | POA: Diagnosis not present

## 2014-12-01 DIAGNOSIS — M25551 Pain in right hip: Secondary | ICD-10-CM | POA: Diagnosis not present

## 2014-12-01 DIAGNOSIS — M25562 Pain in left knee: Secondary | ICD-10-CM | POA: Diagnosis not present

## 2014-12-05 DIAGNOSIS — M25551 Pain in right hip: Secondary | ICD-10-CM | POA: Diagnosis not present

## 2014-12-05 DIAGNOSIS — I4891 Unspecified atrial fibrillation: Secondary | ICD-10-CM | POA: Diagnosis not present

## 2014-12-05 DIAGNOSIS — Z96641 Presence of right artificial hip joint: Secondary | ICD-10-CM | POA: Diagnosis not present

## 2014-12-05 DIAGNOSIS — R339 Retention of urine, unspecified: Secondary | ICD-10-CM | POA: Diagnosis not present

## 2014-12-05 DIAGNOSIS — Z9181 History of falling: Secondary | ICD-10-CM | POA: Diagnosis not present

## 2014-12-05 DIAGNOSIS — J449 Chronic obstructive pulmonary disease, unspecified: Secondary | ICD-10-CM | POA: Diagnosis not present

## 2014-12-06 DIAGNOSIS — Z96641 Presence of right artificial hip joint: Secondary | ICD-10-CM | POA: Diagnosis not present

## 2014-12-06 DIAGNOSIS — Z9181 History of falling: Secondary | ICD-10-CM | POA: Diagnosis not present

## 2014-12-06 DIAGNOSIS — R339 Retention of urine, unspecified: Secondary | ICD-10-CM | POA: Diagnosis not present

## 2014-12-06 DIAGNOSIS — I4891 Unspecified atrial fibrillation: Secondary | ICD-10-CM | POA: Diagnosis not present

## 2014-12-06 DIAGNOSIS — J449 Chronic obstructive pulmonary disease, unspecified: Secondary | ICD-10-CM | POA: Diagnosis not present

## 2014-12-06 DIAGNOSIS — M25551 Pain in right hip: Secondary | ICD-10-CM | POA: Diagnosis not present

## 2014-12-07 DIAGNOSIS — J449 Chronic obstructive pulmonary disease, unspecified: Secondary | ICD-10-CM | POA: Diagnosis not present

## 2014-12-07 DIAGNOSIS — Z9181 History of falling: Secondary | ICD-10-CM | POA: Diagnosis not present

## 2014-12-07 DIAGNOSIS — R339 Retention of urine, unspecified: Secondary | ICD-10-CM | POA: Diagnosis not present

## 2014-12-07 DIAGNOSIS — I4891 Unspecified atrial fibrillation: Secondary | ICD-10-CM | POA: Diagnosis not present

## 2014-12-07 DIAGNOSIS — Z96641 Presence of right artificial hip joint: Secondary | ICD-10-CM | POA: Diagnosis not present

## 2014-12-07 DIAGNOSIS — M25551 Pain in right hip: Secondary | ICD-10-CM | POA: Diagnosis not present

## 2014-12-11 DIAGNOSIS — R339 Retention of urine, unspecified: Secondary | ICD-10-CM | POA: Diagnosis not present

## 2014-12-11 DIAGNOSIS — Z9181 History of falling: Secondary | ICD-10-CM | POA: Diagnosis not present

## 2014-12-11 DIAGNOSIS — I4891 Unspecified atrial fibrillation: Secondary | ICD-10-CM | POA: Diagnosis not present

## 2014-12-11 DIAGNOSIS — M25551 Pain in right hip: Secondary | ICD-10-CM | POA: Diagnosis not present

## 2014-12-11 DIAGNOSIS — J449 Chronic obstructive pulmonary disease, unspecified: Secondary | ICD-10-CM | POA: Diagnosis not present

## 2014-12-11 DIAGNOSIS — Z96641 Presence of right artificial hip joint: Secondary | ICD-10-CM | POA: Diagnosis not present

## 2014-12-12 ENCOUNTER — Other Ambulatory Visit: Payer: Self-pay | Admitting: *Deleted

## 2014-12-12 ENCOUNTER — Telehealth: Payer: Self-pay | Admitting: Cardiology

## 2014-12-12 DIAGNOSIS — Z96641 Presence of right artificial hip joint: Secondary | ICD-10-CM | POA: Diagnosis not present

## 2014-12-12 DIAGNOSIS — I4891 Unspecified atrial fibrillation: Secondary | ICD-10-CM | POA: Diagnosis not present

## 2014-12-12 DIAGNOSIS — J449 Chronic obstructive pulmonary disease, unspecified: Secondary | ICD-10-CM | POA: Diagnosis not present

## 2014-12-12 DIAGNOSIS — R339 Retention of urine, unspecified: Secondary | ICD-10-CM | POA: Diagnosis not present

## 2014-12-12 DIAGNOSIS — M25551 Pain in right hip: Secondary | ICD-10-CM | POA: Diagnosis not present

## 2014-12-12 DIAGNOSIS — Z9181 History of falling: Secondary | ICD-10-CM | POA: Diagnosis not present

## 2014-12-12 MED ORDER — APIXABAN 5 MG PO TABS: 5.0000 mg | ORAL_TABLET | Freq: Two times a day (BID) | ORAL | Status: DC

## 2014-12-12 NOTE — Telephone Encounter (Signed)
Refill resent for updated pharmacy.

## 2014-12-12 NOTE — Telephone Encounter (Signed)
°  1. Which medications need to be refilled? Eliquis  2. Which pharmacy is medication to be sent to?[facility]  3. Do they need a 30 day or 90 day supply? 90 and refills  4. Would they like a call back once the medication has been sent to the pharmacy? yes

## 2014-12-12 NOTE — Telephone Encounter (Signed)
Called back, left message that Rx was sent to pharmacy, if questions or further needs to call back.

## 2014-12-14 DIAGNOSIS — I4891 Unspecified atrial fibrillation: Secondary | ICD-10-CM | POA: Diagnosis not present

## 2014-12-14 DIAGNOSIS — M25551 Pain in right hip: Secondary | ICD-10-CM | POA: Diagnosis not present

## 2014-12-14 DIAGNOSIS — J449 Chronic obstructive pulmonary disease, unspecified: Secondary | ICD-10-CM | POA: Diagnosis not present

## 2014-12-14 DIAGNOSIS — R339 Retention of urine, unspecified: Secondary | ICD-10-CM | POA: Diagnosis not present

## 2014-12-14 DIAGNOSIS — Z9181 History of falling: Secondary | ICD-10-CM | POA: Diagnosis not present

## 2014-12-14 DIAGNOSIS — Z96641 Presence of right artificial hip joint: Secondary | ICD-10-CM | POA: Diagnosis not present

## 2014-12-18 DIAGNOSIS — I4891 Unspecified atrial fibrillation: Secondary | ICD-10-CM | POA: Diagnosis not present

## 2014-12-18 DIAGNOSIS — Z96641 Presence of right artificial hip joint: Secondary | ICD-10-CM | POA: Diagnosis not present

## 2014-12-18 DIAGNOSIS — J449 Chronic obstructive pulmonary disease, unspecified: Secondary | ICD-10-CM | POA: Diagnosis not present

## 2014-12-18 DIAGNOSIS — M25551 Pain in right hip: Secondary | ICD-10-CM | POA: Diagnosis not present

## 2014-12-18 DIAGNOSIS — Z9181 History of falling: Secondary | ICD-10-CM | POA: Diagnosis not present

## 2014-12-18 DIAGNOSIS — R339 Retention of urine, unspecified: Secondary | ICD-10-CM | POA: Diagnosis not present

## 2014-12-23 DIAGNOSIS — Z96641 Presence of right artificial hip joint: Secondary | ICD-10-CM | POA: Diagnosis not present

## 2014-12-23 DIAGNOSIS — Z9181 History of falling: Secondary | ICD-10-CM | POA: Diagnosis not present

## 2014-12-23 DIAGNOSIS — M25551 Pain in right hip: Secondary | ICD-10-CM | POA: Diagnosis not present

## 2014-12-23 DIAGNOSIS — J449 Chronic obstructive pulmonary disease, unspecified: Secondary | ICD-10-CM | POA: Diagnosis not present

## 2014-12-23 DIAGNOSIS — R339 Retention of urine, unspecified: Secondary | ICD-10-CM | POA: Diagnosis not present

## 2014-12-23 DIAGNOSIS — I4891 Unspecified atrial fibrillation: Secondary | ICD-10-CM | POA: Diagnosis not present

## 2014-12-26 DIAGNOSIS — J449 Chronic obstructive pulmonary disease, unspecified: Secondary | ICD-10-CM | POA: Diagnosis not present

## 2014-12-26 DIAGNOSIS — Z96641 Presence of right artificial hip joint: Secondary | ICD-10-CM | POA: Diagnosis not present

## 2014-12-26 DIAGNOSIS — M25551 Pain in right hip: Secondary | ICD-10-CM | POA: Diagnosis not present

## 2014-12-26 DIAGNOSIS — R339 Retention of urine, unspecified: Secondary | ICD-10-CM | POA: Diagnosis not present

## 2014-12-26 DIAGNOSIS — Z9181 History of falling: Secondary | ICD-10-CM | POA: Diagnosis not present

## 2014-12-26 DIAGNOSIS — I4891 Unspecified atrial fibrillation: Secondary | ICD-10-CM | POA: Diagnosis not present

## 2014-12-27 DIAGNOSIS — J449 Chronic obstructive pulmonary disease, unspecified: Secondary | ICD-10-CM | POA: Diagnosis not present

## 2014-12-27 DIAGNOSIS — I4891 Unspecified atrial fibrillation: Secondary | ICD-10-CM | POA: Diagnosis not present

## 2014-12-27 DIAGNOSIS — M25551 Pain in right hip: Secondary | ICD-10-CM | POA: Diagnosis not present

## 2014-12-27 DIAGNOSIS — R339 Retention of urine, unspecified: Secondary | ICD-10-CM | POA: Diagnosis not present

## 2014-12-27 DIAGNOSIS — Z9181 History of falling: Secondary | ICD-10-CM | POA: Diagnosis not present

## 2014-12-27 DIAGNOSIS — Z96641 Presence of right artificial hip joint: Secondary | ICD-10-CM | POA: Diagnosis not present

## 2014-12-29 DIAGNOSIS — I4891 Unspecified atrial fibrillation: Secondary | ICD-10-CM | POA: Diagnosis not present

## 2014-12-29 DIAGNOSIS — J449 Chronic obstructive pulmonary disease, unspecified: Secondary | ICD-10-CM | POA: Diagnosis not present

## 2014-12-29 DIAGNOSIS — R339 Retention of urine, unspecified: Secondary | ICD-10-CM | POA: Diagnosis not present

## 2014-12-29 DIAGNOSIS — Z96641 Presence of right artificial hip joint: Secondary | ICD-10-CM | POA: Diagnosis not present

## 2014-12-29 DIAGNOSIS — Z9181 History of falling: Secondary | ICD-10-CM | POA: Diagnosis not present

## 2014-12-29 DIAGNOSIS — M25551 Pain in right hip: Secondary | ICD-10-CM | POA: Diagnosis not present

## 2015-01-03 DIAGNOSIS — Z96641 Presence of right artificial hip joint: Secondary | ICD-10-CM | POA: Diagnosis not present

## 2015-01-03 DIAGNOSIS — J449 Chronic obstructive pulmonary disease, unspecified: Secondary | ICD-10-CM | POA: Diagnosis not present

## 2015-01-03 DIAGNOSIS — R339 Retention of urine, unspecified: Secondary | ICD-10-CM | POA: Diagnosis not present

## 2015-01-03 DIAGNOSIS — Z9181 History of falling: Secondary | ICD-10-CM | POA: Diagnosis not present

## 2015-01-03 DIAGNOSIS — M25551 Pain in right hip: Secondary | ICD-10-CM | POA: Diagnosis not present

## 2015-01-03 DIAGNOSIS — I4891 Unspecified atrial fibrillation: Secondary | ICD-10-CM | POA: Diagnosis not present

## 2015-01-05 DIAGNOSIS — J449 Chronic obstructive pulmonary disease, unspecified: Secondary | ICD-10-CM | POA: Diagnosis not present

## 2015-01-05 DIAGNOSIS — M25551 Pain in right hip: Secondary | ICD-10-CM | POA: Diagnosis not present

## 2015-01-05 DIAGNOSIS — R339 Retention of urine, unspecified: Secondary | ICD-10-CM | POA: Diagnosis not present

## 2015-01-05 DIAGNOSIS — I4891 Unspecified atrial fibrillation: Secondary | ICD-10-CM | POA: Diagnosis not present

## 2015-01-05 DIAGNOSIS — Z9181 History of falling: Secondary | ICD-10-CM | POA: Diagnosis not present

## 2015-01-05 DIAGNOSIS — Z96641 Presence of right artificial hip joint: Secondary | ICD-10-CM | POA: Diagnosis not present

## 2015-01-09 DIAGNOSIS — J449 Chronic obstructive pulmonary disease, unspecified: Secondary | ICD-10-CM | POA: Diagnosis not present

## 2015-01-09 DIAGNOSIS — R339 Retention of urine, unspecified: Secondary | ICD-10-CM | POA: Diagnosis not present

## 2015-01-09 DIAGNOSIS — Z96641 Presence of right artificial hip joint: Secondary | ICD-10-CM | POA: Diagnosis not present

## 2015-01-09 DIAGNOSIS — Z9181 History of falling: Secondary | ICD-10-CM | POA: Diagnosis not present

## 2015-01-09 DIAGNOSIS — M25551 Pain in right hip: Secondary | ICD-10-CM | POA: Diagnosis not present

## 2015-01-09 DIAGNOSIS — I4891 Unspecified atrial fibrillation: Secondary | ICD-10-CM | POA: Diagnosis not present

## 2015-01-11 DIAGNOSIS — I4891 Unspecified atrial fibrillation: Secondary | ICD-10-CM | POA: Diagnosis not present

## 2015-01-11 DIAGNOSIS — Z9181 History of falling: Secondary | ICD-10-CM | POA: Diagnosis not present

## 2015-01-11 DIAGNOSIS — Z96641 Presence of right artificial hip joint: Secondary | ICD-10-CM | POA: Diagnosis not present

## 2015-01-11 DIAGNOSIS — M25551 Pain in right hip: Secondary | ICD-10-CM | POA: Diagnosis not present

## 2015-01-11 DIAGNOSIS — J449 Chronic obstructive pulmonary disease, unspecified: Secondary | ICD-10-CM | POA: Diagnosis not present

## 2015-01-11 DIAGNOSIS — R339 Retention of urine, unspecified: Secondary | ICD-10-CM | POA: Diagnosis not present

## 2015-01-18 DIAGNOSIS — Z96641 Presence of right artificial hip joint: Secondary | ICD-10-CM | POA: Diagnosis not present

## 2015-01-18 DIAGNOSIS — J449 Chronic obstructive pulmonary disease, unspecified: Secondary | ICD-10-CM | POA: Diagnosis not present

## 2015-01-18 DIAGNOSIS — I4891 Unspecified atrial fibrillation: Secondary | ICD-10-CM | POA: Diagnosis not present

## 2015-01-18 DIAGNOSIS — M25551 Pain in right hip: Secondary | ICD-10-CM | POA: Diagnosis not present

## 2015-01-18 DIAGNOSIS — R339 Retention of urine, unspecified: Secondary | ICD-10-CM | POA: Diagnosis not present

## 2015-01-18 DIAGNOSIS — Z9181 History of falling: Secondary | ICD-10-CM | POA: Diagnosis not present

## 2015-01-22 DIAGNOSIS — M79604 Pain in right leg: Secondary | ICD-10-CM | POA: Diagnosis not present

## 2015-01-22 DIAGNOSIS — M25561 Pain in right knee: Secondary | ICD-10-CM | POA: Diagnosis not present

## 2015-01-22 DIAGNOSIS — Z8781 Personal history of (healed) traumatic fracture: Secondary | ICD-10-CM | POA: Diagnosis not present

## 2015-01-23 ENCOUNTER — Telehealth: Payer: Self-pay | Admitting: Internal Medicine

## 2015-01-23 NOTE — Telephone Encounter (Signed)
New message       *STAT* If patient is at the pharmacy, call can be transferred to refill team.   1. Which medications need to be refilled? (please list name of each medication and dose if known) eliquis 5mg  (pt has 1 pill left)  2. Which pharmacy/location (including street and city if local pharmacy) is medication to be sent to?[facility] [place]  3. Do they need a 30 day or 90 day supply? 90 day

## 2015-01-23 NOTE — Telephone Encounter (Signed)
Called pt and left message informing him that he has refill of his Eliquis 5 mg tablets at his pharmacy and that he just has to call his pharmacy to request a refill.

## 2015-02-13 DIAGNOSIS — M79604 Pain in right leg: Secondary | ICD-10-CM | POA: Diagnosis not present

## 2015-02-20 DIAGNOSIS — H353122 Nonexudative age-related macular degeneration, left eye, intermediate dry stage: Secondary | ICD-10-CM | POA: Diagnosis not present

## 2015-03-08 DIAGNOSIS — R1013 Epigastric pain: Secondary | ICD-10-CM | POA: Diagnosis not present

## 2015-03-08 DIAGNOSIS — I10 Essential (primary) hypertension: Secondary | ICD-10-CM | POA: Diagnosis not present

## 2015-03-08 DIAGNOSIS — R0600 Dyspnea, unspecified: Secondary | ICD-10-CM | POA: Diagnosis not present

## 2015-03-28 DIAGNOSIS — R0902 Hypoxemia: Secondary | ICD-10-CM | POA: Diagnosis not present

## 2015-03-28 DIAGNOSIS — J449 Chronic obstructive pulmonary disease, unspecified: Secondary | ICD-10-CM | POA: Diagnosis not present

## 2015-03-28 DIAGNOSIS — R0602 Shortness of breath: Secondary | ICD-10-CM | POA: Diagnosis not present

## 2015-04-10 DIAGNOSIS — Z87891 Personal history of nicotine dependence: Secondary | ICD-10-CM | POA: Diagnosis not present

## 2015-04-10 DIAGNOSIS — M4854XA Collapsed vertebra, not elsewhere classified, thoracic region, initial encounter for fracture: Secondary | ICD-10-CM | POA: Diagnosis not present

## 2015-04-10 DIAGNOSIS — R61 Generalized hyperhidrosis: Secondary | ICD-10-CM | POA: Diagnosis not present

## 2015-04-10 DIAGNOSIS — R0902 Hypoxemia: Secondary | ICD-10-CM | POA: Diagnosis not present

## 2015-04-10 DIAGNOSIS — J439 Emphysema, unspecified: Secondary | ICD-10-CM | POA: Diagnosis not present

## 2015-04-10 DIAGNOSIS — R0602 Shortness of breath: Secondary | ICD-10-CM | POA: Diagnosis not present

## 2015-04-10 DIAGNOSIS — R918 Other nonspecific abnormal finding of lung field: Secondary | ICD-10-CM | POA: Diagnosis not present

## 2015-04-18 DIAGNOSIS — R911 Solitary pulmonary nodule: Secondary | ICD-10-CM | POA: Diagnosis not present

## 2015-04-18 DIAGNOSIS — R0602 Shortness of breath: Secondary | ICD-10-CM | POA: Diagnosis not present

## 2015-04-18 DIAGNOSIS — J449 Chronic obstructive pulmonary disease, unspecified: Secondary | ICD-10-CM | POA: Diagnosis not present

## 2015-04-18 DIAGNOSIS — R0902 Hypoxemia: Secondary | ICD-10-CM | POA: Diagnosis not present

## 2015-06-26 ENCOUNTER — Other Ambulatory Visit: Payer: Self-pay | Admitting: *Deleted

## 2015-06-26 MED ORDER — APIXABAN 5 MG PO TABS: 5.0000 mg | ORAL_TABLET | Freq: Two times a day (BID) | ORAL | Status: DC

## 2015-06-29 ENCOUNTER — Telehealth: Payer: Self-pay

## 2015-06-29 NOTE — Telephone Encounter (Signed)
Prior auth obtianed for Eliquis 5mg , and is good through 03/02/2016. [id number]. Local pharmacy notified.

## 2015-06-29 NOTE — Telephone Encounter (Signed)
Eliquis 5 mg approved by [facility]. Good through 03/02/2016. PA- [id number].

## 2016-01-25 ENCOUNTER — Inpatient Hospital Stay (HOSPITAL_COMMUNITY)
Admission: EM | Admit: 2016-01-25 | Discharge: 2016-01-30 | DRG: 394 | Disposition: A | Discharge status: Home Health | Payer: Medicare Other | Attending: Internal Medicine | Admitting: Internal Medicine

## 2016-01-25 ENCOUNTER — Emergency Department (HOSPITAL_COMMUNITY): Payer: Medicare Other

## 2016-01-25 ENCOUNTER — Encounter (HOSPITAL_COMMUNITY): Payer: Self-pay

## 2016-01-25 DIAGNOSIS — K430 Incisional hernia with obstruction, without gangrene: Secondary | ICD-10-CM

## 2016-01-25 DIAGNOSIS — K469 Unspecified abdominal hernia without obstruction or gangrene: Secondary | ICD-10-CM | POA: Diagnosis present

## 2016-01-25 DIAGNOSIS — R109 Unspecified abdominal pain: Secondary | ICD-10-CM | POA: Diagnosis not present

## 2016-01-25 DIAGNOSIS — K449 Diaphragmatic hernia without obstruction or gangrene: Secondary | ICD-10-CM | POA: Diagnosis not present

## 2016-01-25 DIAGNOSIS — R17 Unspecified jaundice: Secondary | ICD-10-CM | POA: Diagnosis present

## 2016-01-25 DIAGNOSIS — K432 Incisional hernia without obstruction or gangrene: Secondary | ICD-10-CM | POA: Diagnosis not present

## 2016-01-25 DIAGNOSIS — I4891 Unspecified atrial fibrillation: Secondary | ICD-10-CM | POA: Diagnosis not present

## 2016-01-25 DIAGNOSIS — Z87891 Personal history of nicotine dependence: Secondary | ICD-10-CM | POA: Diagnosis not present

## 2016-01-25 DIAGNOSIS — Z903 Acquired absence of stomach [part of]: Secondary | ICD-10-CM | POA: Diagnosis not present

## 2016-01-25 DIAGNOSIS — I480 Paroxysmal atrial fibrillation: Secondary | ICD-10-CM | POA: Diagnosis not present

## 2016-01-25 DIAGNOSIS — Z7901 Long term (current) use of anticoagulants: Secondary | ICD-10-CM | POA: Diagnosis not present

## 2016-01-25 DIAGNOSIS — N138 Other obstructive and reflux uropathy: Secondary | ICD-10-CM

## 2016-01-25 DIAGNOSIS — M6281 Muscle weakness (generalized): Secondary | ICD-10-CM

## 2016-01-25 DIAGNOSIS — Z9079 Acquired absence of other genital organ(s): Secondary | ICD-10-CM | POA: Diagnosis not present

## 2016-01-25 DIAGNOSIS — N401 Enlarged prostate with lower urinary tract symptoms: Secondary | ICD-10-CM | POA: Diagnosis not present

## 2016-01-25 DIAGNOSIS — I10 Essential (primary) hypertension: Secondary | ICD-10-CM | POA: Diagnosis present

## 2016-01-25 DIAGNOSIS — Z9981 Dependence on supplemental oxygen: Secondary | ICD-10-CM | POA: Diagnosis not present

## 2016-01-25 DIAGNOSIS — Z823 Family history of stroke: Secondary | ICD-10-CM | POA: Diagnosis not present

## 2016-01-25 DIAGNOSIS — R0602 Shortness of breath: Secondary | ICD-10-CM | POA: Diagnosis not present

## 2016-01-25 DIAGNOSIS — Z7951 Long term (current) use of inhaled steroids: Secondary | ICD-10-CM | POA: Diagnosis not present

## 2016-01-25 DIAGNOSIS — Z79899 Other long term (current) drug therapy: Secondary | ICD-10-CM

## 2016-01-25 DIAGNOSIS — I340 Nonrheumatic mitral (valve) insufficiency: Secondary | ICD-10-CM | POA: Diagnosis present

## 2016-01-25 DIAGNOSIS — J441 Chronic obstructive pulmonary disease with (acute) exacerbation: Secondary | ICD-10-CM | POA: Diagnosis not present

## 2016-01-25 DIAGNOSIS — K219 Gastro-esophageal reflux disease without esophagitis: Secondary | ICD-10-CM | POA: Diagnosis not present

## 2016-01-25 DIAGNOSIS — R1013 Epigastric pain: Secondary | ICD-10-CM | POA: Diagnosis not present

## 2016-01-25 DIAGNOSIS — N400 Enlarged prostate without lower urinary tract symptoms: Secondary | ICD-10-CM | POA: Diagnosis not present

## 2016-01-25 DIAGNOSIS — K429 Umbilical hernia without obstruction or gangrene: Secondary | ICD-10-CM | POA: Diagnosis not present

## 2016-01-25 DIAGNOSIS — K458 Other specified abdominal hernia without obstruction or gangrene: Secondary | ICD-10-CM | POA: Diagnosis not present

## 2016-01-25 LAB — CBC
HEMATOCRIT: 45.8 % (ref 39.0–52.0)
HEMOGLOBIN: 14.7 g/dL (ref 13.0–17.0)
MCH: 26.5 pg (ref 26.0–34.0)
MCHC: 32.1 g/dL (ref 30.0–36.0)
MCV: 82.5 fL (ref 78.0–100.0)
PLATELETS: 274 10*3/uL (ref 150–400)
RBC: 5.55 MIL/uL (ref 4.22–5.81)
RDW: 16.2 % — ABNORMAL HIGH (ref 11.5–15.5)
WBC: 7.3 10*3/uL (ref 4.0–10.5)

## 2016-01-25 LAB — COMPREHENSIVE METABOLIC PANEL
ALBUMIN: 3.7 g/dL (ref 3.5–5.0)
ALK PHOS: 87 U/L (ref 38–126)
ALT: 16 U/L — ABNORMAL LOW (ref 17–63)
ANION GAP: 8 (ref 5–15)
AST: 23 U/L (ref 15–41)
BUN: 10 mg/dL (ref 6–20)
CALCIUM: 9.2 mg/dL (ref 8.9–10.3)
CO2: 30 mmol/L (ref 22–32)
Chloride: 96 mmol/L — ABNORMAL LOW (ref 101–111)
Creatinine, Ser: 0.94 mg/dL (ref 0.61–1.24)
GFR calc Af Amer: >60 mL/min (ref >60)
GFR calc non Af Amer: >60 mL/min (ref >60)
GLUCOSE: 98 mg/dL (ref 65–99)
POTASSIUM: 5 mmol/L (ref 3.5–5.1)
SODIUM: 134 mmol/L — AB (ref 135–145)
Total Bilirubin: 0.5 mg/dL (ref 0.3–1.2)
Total Protein: 7.8 g/dL (ref 6.5–8.1)

## 2016-01-25 LAB — I-STAT TROPONIN, ED: Troponin i, poc: 0.01 ng/mL (ref 0.00–0.08)

## 2016-01-25 LAB — CBG MONITORING, ED
GLUCOSE-CAPILLARY: 91 mg/dL (ref 65–99)
Glucose-Capillary: 101 mg/dL — ABNORMAL HIGH (ref 65–99)

## 2016-01-25 LAB — LIPASE, BLOOD: Lipase: 34 U/L (ref 11–51)

## 2016-01-25 LAB — LACTIC ACID, PLASMA: LACTIC ACID, VENOUS: 0.7 mmol/L (ref 0.5–1.9)

## 2016-01-25 MED ORDER — MORPHINE SULFATE (PF) 4 MG/ML IV SOLN
4.0000 mg | Freq: Once | INTRAVENOUS | Status: AC
  Administered 2016-01-25: 4 mg via INTRAVENOUS
  Filled 2016-01-25: qty 1

## 2016-01-25 MED ORDER — ARFORMOTEROL TARTRATE 15 MCG/2ML IN NEBU: 15.0000 ug | INHALATION_SOLUTION | Freq: Two times a day (BID) | RESPIRATORY_TRACT | Status: DC

## 2016-01-25 MED ORDER — MOMETASONE FURO-FORMOTEROL FUM 200-5 MCG/ACT IN AERO
2.0000 | INHALATION_SPRAY | Freq: Two times a day (BID) | RESPIRATORY_TRACT | Status: DC
  Administered 2016-01-26 – 2016-01-30 (×9): 2 via RESPIRATORY_TRACT
  Filled 2016-01-25: qty 8.8

## 2016-01-25 MED ORDER — ALBUTEROL SULFATE (2.5 MG/3ML) 0.083% IN NEBU
2.5000 mg | INHALATION_SOLUTION | RESPIRATORY_TRACT | Status: DC
  Administered 2016-01-25 – 2016-01-26 (×3): 2.5 mg via RESPIRATORY_TRACT
  Filled 2016-01-25 (×2): qty 3

## 2016-01-25 MED ORDER — ONDANSETRON HCL 4 MG/2ML IJ SOLN
4.0000 mg | Freq: Four times a day (QID) | INTRAMUSCULAR | Status: DC | PRN
  Administered 2016-01-30: 4 mg via INTRAVENOUS
  Filled 2016-01-25: qty 2

## 2016-01-25 MED ORDER — DEXTROSE-NACL 5-0.45 % IV SOLN
INTRAVENOUS | Status: DC
  Administered 2016-01-25 – 2016-01-29 (×2): via INTRAVENOUS

## 2016-01-25 MED ORDER — METHYLPREDNISOLONE SODIUM SUCC 125 MG IJ SOLR
60.0000 mg | INTRAMUSCULAR | Status: DC
  Administered 2016-01-25 – 2016-01-28 (×4): 60 mg via INTRAVENOUS
  Filled 2016-01-25 (×5): qty 2

## 2016-01-25 MED ORDER — ALBUTEROL SULFATE (2.5 MG/3ML) 0.083% IN NEBU
INHALATION_SOLUTION | RESPIRATORY_TRACT | Status: AC
  Filled 2016-01-25: qty 3

## 2016-01-25 MED ORDER — ONDANSETRON HCL 4 MG PO TABS: 4.0000 mg | ORAL_TABLET | Freq: Four times a day (QID) | ORAL | Status: DC | PRN

## 2016-01-25 MED ORDER — FAMOTIDINE IN NACL 20-0.9 MG/50ML-% IV SOLN
20.0000 mg | Freq: Two times a day (BID) | INTRAVENOUS | Status: DC
  Administered 2016-01-25 – 2016-01-30 (×10): 20 mg via INTRAVENOUS
  Filled 2016-01-25 (×12): qty 50

## 2016-01-25 MED ORDER — PIPERACILLIN-TAZOBACTAM 3.375 G IVPB
3.3750 g | Freq: Three times a day (TID) | INTRAVENOUS | Status: DC
  Administered 2016-01-26 – 2016-01-29 (×11): 3.375 g via INTRAVENOUS
  Filled 2016-01-25 (×15): qty 50

## 2016-01-25 MED ORDER — PIPERACILLIN-TAZOBACTAM 3.375 G IVPB 30 MIN
3.3750 g | Freq: Once | INTRAVENOUS | Status: AC
  Administered 2016-01-25: 3.375 g via INTRAVENOUS
  Filled 2016-01-25: qty 50

## 2016-01-25 MED ORDER — INSULIN ASPART 100 UNIT/ML ~~LOC~~ SOLN: 0.0000 [IU] | Freq: Three times a day (TID) | SUBCUTANEOUS | Status: DC

## 2016-01-25 MED ORDER — IOPAMIDOL (ISOVUE-370) INJECTION 76%
INTRAVENOUS | Status: AC
  Administered 2016-01-25: 100 mL
  Filled 2016-01-25: qty 100

## 2016-01-25 NOTE — Consult Note (Signed)
Reason for Consult: Complex incisional hernia Referring Physician: Gilford Raid MD  Neil Terry is an 82 y.o. male.  HPI: Asked to see 82 year old male with a history of a chronic complex incisional hernia. He has had this for many years. He is from [place] he was supposed to have this repaired last month injury. His son had concerns about his care asked that he be transferred to [place] for medical care. He is in a two-week history of worsening abdominal pain. His bowels continue to move. Yesterday after Thanksgiving dinner, his abdominal pain worsened. He has had no nausea or vomiting but has more pain at his hernia site. He brought records from [place] but these are currently unavailable to review. CT scan shows complex incisional hernia with no obvious signs of obstruction. There is some thickened small bowel but no signs of perforation. His white count is normal. He has severe end-stage COPD and is on oxygen at home. He is short of breath today. He has a history of atrial fibrillation and had been on anticoagulation and this was stopped a couple months ago secondary to a GI bleed. His family says bedside. He complains of lower abdominal pain. He is also severely short of breath.  Past Medical History:  Diagnosis Date  . BPH (benign prostatic hypertrophy)   . Bronchial asthma   . COPD (chronic obstructive pulmonary disease) ([place])   . Depression    hx  . Duodenal ulcer    perforated ~ 2013  . GERD (gastroesophageal reflux disease)   . HTN (hypertension)   . On home oxygen therapy    "8h/day; don't know how many liters"    Past Surgical History:  Procedure Laterality Date  . ABDOMINAL EXPLORATION SURGERY  02/23/2012  . CATARACT EXTRACTION, BILATERAL    . FRACTURE SURGERY    . GASTRIC ROUX-EN-Y  ~ 1226/2013   closure of duodenal stump w/Roux-en-Y gastro jejunalanastomosis  . HIP FRACTURE SURGERY Right 2000's  . HIP HARDWARE REMOVAL Right ~ 2011  . PARTIAL GASTRECTOMY  2013-2014   distal; w/closure of duodenal stump; gastro jejunalanastomosis  . TRANSURETHRAL RESECTION OF PROSTATE  2016    Family History  Problem Relation Age of Onset  . Arrhythmia Son   . Stroke Brother     Social History:  reports that he has quit smoking. His smoking use included Cigarettes. He has a 150.00 pack-year smoking history. He has never used smokeless tobacco. He reports that he does not drink alcohol or use drugs.  Allergies: No Known Allergies  Medications: I have reviewed the patient's current medications.  Results for orders placed or performed during the hospital encounter of 01/25/16 (from the past 48 hour(s))  CBC     Status: Abnormal   Collection Time: 01/25/16  9:52 AM  Result Value Ref Range   WBC 7.3 4.0 - 10.5 K/uL   RBC 5.55 4.22 - 5.81 MIL/uL   Hemoglobin 14.7 13.0 - 17.0 g/dL   HCT 45.8 39.0 - 52.0 %   MCV 82.5 78.0 - 100.0 fL   MCH 26.5 26.0 - 34.0 pg   MCHC 32.1 30.0 - 36.0 g/dL   RDW 16.2 (H) 11.5 - 15.5 %   Platelets 274 150 - 400 K/uL  Comprehensive metabolic panel     Status: Abnormal   Collection Time: 01/25/16  9:52 AM  Result Value Ref Range   Sodium 134 (L) 135 - 145 mmol/L   Potassium 5.0 3.5 - 5.1 mmol/L   Chloride 96 (L) 101 -  111 mmol/L   CO2 30 22 - 32 mmol/L   Glucose, Bld 98 65 - 99 mg/dL   BUN 10 6 - 20 mg/dL   Creatinine, Ser 0.94 0.61 - 1.24 mg/dL   Calcium 9.2 8.9 - 10.3 mg/dL   Total Protein 7.8 6.5 - 8.1 g/dL   Albumin 3.7 3.5 - 5.0 g/dL   AST 23 15 - 41 U/L   ALT 16 (L) 17 - 63 U/L   Alkaline Phosphatase 87 38 - 126 U/L   Total Bilirubin 0.5 0.3 - 1.2 mg/dL   GFR calc non Af Amer >60 >60 mL/min   GFR calc Af Amer >60 >60 mL/min    Comment: (NOTE) The eGFR has been calculated using the CKD EPI equation. This calculation has not been validated in all clinical situations. eGFR's persistently <60 mL/min signify possible Chronic Kidney Disease.    Anion gap 8 5 - 15  Lipase, blood     Status: None   Collection Time: 01/25/16   9:52 AM  Result Value Ref Range   Lipase 34 11 - 51 U/L  I-stat troponin, ED     Status: None   Collection Time: 01/25/16 10:51 AM  Result Value Ref Range   Troponin i, poc 0.01 0.00 - 0.08 ng/mL   Comment 3            Comment: Due to the release kinetics of cTnI, a negative result within the first hours of the onset of symptoms does not rule out myocardial infarction with certainty. If myocardial infarction is still suspected, repeat the test at appropriate intervals.     Dg Chest 2 View  Result Date: 01/25/2016 CLINICAL DATA:  Shortness of Breath EXAM: CHEST  2 VIEW COMPARISON:  11/08/2014 FINDINGS: Cardiomegaly. Underline COPD. Linear scarring or atelectasis at the right lung base. No effusions. Multiple moderate mid and lower thoracic spine compression fractures, unchanged since prior CT IMPRESSION: COPD.  Cardiomegaly.  No active disease. Electronically Signed   By: Rolm Baptise M.D.   On: 01/25/2016 10:58   Ct Angio Chest Pe W Or Wo Contrast  Result Date: 01/25/2016 CLINICAL DATA:  Diffuse abdominal pain, shortness of breath, symptoms for last few days, history hypertension, GERD, COPD, partial gastrectomy for ulcer disease, BPH, atrial fibrillation EXAM: CT ANGIOGRAPHY CHEST CT ABDOMEN AND PELVIS WITH CONTRAST TECHNIQUE: Multidetector CT imaging of the chest was performed using the standard protocol during bolus administration of intravenous contrast. Multiplanar CT image reconstructions and MIPs were obtained to evaluate the vascular anatomy. Multidetector CT imaging of the abdomen and pelvis was performed using the standard protocol during bolus administration of intravenous contrast. CONTRAST:  100 cc Isovue 370 IV COMPARISON:  CT chest abdomen pelvis 11/09/2014 FINDINGS: CTA CHEST FINDINGS Cardiovascular: Atherosclerotic calcifications aorta and coronary arteries. Aorta normal caliber without aneurysm or dissection. Pulmonary arteries well opacified and patent. No evidence  pulmonary embolism. No pericardial effusion. Mediastinum/Nodes: Air-filled esophagus without gross wall thickening. No thoracic adenopathy. Beam hardening artifacts at base of cervical region from patient's arms. Lungs/Pleura: Subsegmental atelectasis RIGHT upper lobe and in both lower lobes. Central peribronchial thickening. No acute infiltrate, pleural effusion or pneumothorax. Musculoskeletal: Osseous demineralization. Numerous thoracic spine compression fractures. Old fractures posterior LEFT ninth through twelfth ribs. Review of the MIP images confirms the above findings. CT ABDOMEN and PELVIS FINDINGS Hepatobiliary: Stable small cyst lateral segment LEFT lobe liver 13 x 12 mm. Gallbladder well distended with areas of scattered slight gallbladder wall thickening/irregularity unchanged from previous exam.  No definite calcified gallstones in gallbladder. No biliary dilatation. Pancreas: Normal appearance Spleen: Normal appearance Adrenals/Urinary Tract: Small BILATERAL renal cysts. Adrenal glands, kidneys and ureters otherwise normal appearance. Prostatic enlargement, gland 5.1 x 4.5 x 4.6 cm, containing central calcifications and postsurgical changes of prior TURP. Bladder wall thickening which may reflect chronic outlet obstruction in the setting of prostatic enlargement. Stomach/Bowel: Postsurgical changes of antrectomy and gastrojejunostomy with Roux-en-Y anastamosis. Colon unremarkable. Smaal bowel loo within a small umbilical hernia without obstruction. Additional small bowel loops within an infraumbilical ventral hernia with questionable hyperemia nd thickening of the small bowel wall, unable to exclude enteritis from any etiology including ischemia. No evidence of bowel obstruction or perforation. Vascular/Lymphatic: Atherosclerotic calcifications aorta, aorta up to 2.9 x 2.9 cm diameter. No adenopathy. Reproductive: N/A Other: No free air free fluid. Musculoskeletal: Osseous demineralization with  numerous thoracolumbar spine compression fractures and prior vertebroplasties at L1, L2 and L3. Review of the MIP images confirms the above findings. IMPRESSION: Small umbilical hernia containing a nonobstructed small bowel loop. Infraumbilical ventral hernia containing small bowel loops which demonstrate hyperemia and suspected mild wall thickening, unable to exclude enteritis from any etiology including ischemia. No definite evidence of small bowel obstruction or perforation. Prostatic enlargement with mild bladder wall thickening which could represent muscular hypertrophy on the basis of chronic outlet obstruction despite prior TURP, recommend correlation with symptoms. Aortic atherosclerosis and coronary arterial calcification. No evidence of pulmonary embolism. Osteoporosis with numerous vertebral and rib fractures. Electronically Signed   By: Lavonia Dana M.D.   On: 01/25/2016 12:21   Ct Abdomen Pelvis W Contrast  Result Date: 01/25/2016 CLINICAL DATA:  Diffuse abdominal pain, shortness of breath, symptoms for last few days, history hypertension, GERD, COPD, partial gastrectomy for ulcer disease, BPH, atrial fibrillation EXAM: CT ANGIOGRAPHY CHEST CT ABDOMEN AND PELVIS WITH CONTRAST TECHNIQUE: Multidetector CT imaging of the chest was performed using the standard protocol during bolus administration of intravenous contrast. Multiplanar CT image reconstructions and MIPs were obtained to evaluate the vascular anatomy. Multidetector CT imaging of the abdomen and pelvis was performed using the standard protocol during bolus administration of intravenous contrast. CONTRAST:  100 cc Isovue 370 IV COMPARISON:  CT chest abdomen pelvis 11/09/2014 FINDINGS: CTA CHEST FINDINGS Cardiovascular: Atherosclerotic calcifications aorta and coronary arteries. Aorta normal caliber without aneurysm or dissection. Pulmonary arteries well opacified and patent. No evidence pulmonary embolism. No pericardial effusion.  Mediastinum/Nodes: Air-filled esophagus without gross wall thickening. No thoracic adenopathy. Beam hardening artifacts at base of cervical region from patient's arms. Lungs/Pleura: Subsegmental atelectasis RIGHT upper lobe and in both lower lobes. Central peribronchial thickening. No acute infiltrate, pleural effusion or pneumothorax. Musculoskeletal: Osseous demineralization. Numerous thoracic spine compression fractures. Old fractures posterior LEFT ninth through twelfth ribs. Review of the MIP images confirms the above findings. CT ABDOMEN and PELVIS FINDINGS Hepatobiliary: Stable small cyst lateral segment LEFT lobe liver 13 x 12 mm. Gallbladder well distended with areas of scattered slight gallbladder wall thickening/irregularity unchanged from previous exam. No definite calcified gallstones in gallbladder. No biliary dilatation. Pancreas: Normal appearance Spleen: Normal appearance Adrenals/Urinary Tract: Small BILATERAL renal cysts. Adrenal glands, kidneys and ureters otherwise normal appearance. Prostatic enlargement, gland 5.1 x 4.5 x 4.6 cm, containing central calcifications and postsurgical changes of prior TURP. Bladder wall thickening which may reflect chronic outlet obstruction in the setting of prostatic enlargement. Stomach/Bowel: Postsurgical changes of antrectomy and gastrojejunostomy with Roux-en-Y anastamosis. Colon unremarkable. Smaal bowel loo within a small umbilical hernia without obstruction. Additional small  bowel loops within an infraumbilical ventral hernia with questionable hyperemia nd thickening of the small bowel wall, unable to exclude enteritis from any etiology including ischemia. No evidence of bowel obstruction or perforation. Vascular/Lymphatic: Atherosclerotic calcifications aorta, aorta up to 2.9 x 2.9 cm diameter. No adenopathy. Reproductive: N/A Other: No free air free fluid. Musculoskeletal: Osseous demineralization with numerous thoracolumbar spine compression fractures  and prior vertebroplasties at L1, L2 and L3. Review of the MIP images confirms the above findings. IMPRESSION: Small umbilical hernia containing a nonobstructed small bowel loop. Infraumbilical ventral hernia containing small bowel loops which demonstrate hyperemia and suspected mild wall thickening, unable to exclude enteritis from any etiology including ischemia. No definite evidence of small bowel obstruction or perforation. Prostatic enlargement with mild bladder wall thickening which could represent muscular hypertrophy on the basis of chronic outlet obstruction despite prior TURP, recommend correlation with symptoms. Aortic atherosclerosis and coronary arterial calcification. No evidence of pulmonary embolism. Osteoporosis with numerous vertebral and rib fractures. Electronically Signed   By: Ulyses Southward M.D.   On: 01/25/2016 12:21    Review of Systems  Constitutional: Positive for diaphoresis and malaise/fatigue.  HENT: Positive for hearing loss and tinnitus.   Eyes: Positive for double vision. Negative for blurred vision.  Respiratory: Positive for shortness of breath and wheezing.   Cardiovascular: Positive for leg swelling. Negative for chest pain.  Gastrointestinal: Positive for abdominal pain, heartburn and nausea.  Genitourinary: Positive for dysuria and urgency.  Musculoskeletal: Negative for myalgias and neck pain.  Skin: Negative for itching and rash.  Neurological: Positive for dizziness, weakness and headaches.  Endo/Heme/Allergies: Bruises/bleeds easily.  Psychiatric/Behavioral: Positive for depression and suicidal ideas.   Blood pressure 128/70, pulse 66, temperature 97.7 F (36.5 C), temperature source Oral, resp. rate (!) 33, SpO2 94 %. Physical Exam  Constitutional: He is oriented to person, place, and time. He appears distressed.  HENT:  Head: Normocephalic and atraumatic.  Eyes: Pupils are equal, round, and reactive to light. Scleral icterus is present.  Neck: Normal  range of motion. Neck supple.  Cardiovascular: A regularly irregular rhythm present.  Respiratory: Accessory muscle usage present. Tachypnea noted. He has wheezes.  GI: There is generalized tenderness. There is no rigidity and no rebound. A hernia is present. Hernia confirmed positive in the ventral area.    Neurological: He is alert and oriented to person, place, and time.  Skin: Skin is warm. He is diaphoretic.  Psychiatric: He has a normal mood and affect. His behavior is normal.    Assessment/Plan: Complex chronic multiple incisional hernia  Recommend IV fluid resuscitation and nasogastric tube to see if this helps with his discomfort.  Comparison CT scan from September 2017 which shows hernia present at that time.  Complex fascial defect with very little abdominal wall left reconstruction  Extremely high operative risk given advanced age in severe pulmonary disease .  We'll need medical evaluation to determine underlying medical risks. Does not need emergency surgery at this point but  This a dynamic situation.   Discussed with family at bedside. Extreme high operative risk for repair. Potential mortality of anywhere from 20-30% with surgery given his underlying medical problems. If possible, medical tuneup would be beneficial if and when surgery can be done. Keep nothing by mouth for now. Family aware of severe underlying morbidity and potential palliative surgical intervention. Operation would  Be long with significant risk of injury to bowel given multiple previous operations. Will follow for now. Will nee  Lolita Faulds A. 01/25/2016,  1:37 PM

## 2016-01-25 NOTE — ED Provider Notes (Signed)
[facility]-EMERGENCY DEPT Provider Note   CSN: [id number] Arrival date & time: 01/25/16  4098     History   Chief Complaint Chief Complaint  Patient presents with  . Abdominal Pain  . Shortness of Breath    HPI Neil Terry is a 82 y.o. male.  The history is provided by the patient and medical records. Language interpreter used: Son at bedside aiding in translation as needed.   Neil Terry is a 82 y.o. male  with a PMH of afib, prior UGI bleed 4 months ago, AAA, ventral hernia who presents to the Emergency Department complaining of abdominal pain x 2 weeks, acutely worsening this morning associated with shortness of breath. Patient states pain is much worse with coughing or moving. Pain is improved when wearing his binder belt. Some nausea, but no emesis. No fevers. He was on eliquis for afib but not on any anticoagulation currently. Unsure if this is the medication he ran out of two days ago or if this was medication that was discontinued after GI bleed. No medication taken prior to arrival for symptoms. He did recently travel to [place] and had a 14 hour plane flight back to the [place] on November 14th.   Past Medical History:  Diagnosis Date  . BPH (benign prostatic hypertrophy)   . Bronchial asthma   . COPD (chronic obstructive pulmonary disease) (HCC)   . Depression    hx  . Duodenal ulcer    perforated ~ 2013  . GERD (gastroesophageal reflux disease)   . HTN (hypertension)   . On home oxygen therapy    "8h/day; don't know how many liters"    Patient Active Problem List   Diagnosis Date Noted  . Chronic anticoagulation 11/24/2014  . Atrial fibrillation, new onset (HCC) 11/10/2014  . Atrial fibrillation with RVR (HCC) 11/10/2014  . Chest pain 11/10/2014  . RUQ pain 11/10/2014  . Renal cyst, right 11/10/2014  . AAA (abdominal aortic aneurysm) without rupture (HCC) 11/10/2014  . Ventral hernia 11/10/2014  . BPH (benign prostatic hyperplasia) 11/10/2014  . Nausea and vomiting  11/09/2014  . CAP (community acquired pneumonia) 11/09/2014  . COPD with acute exacerbation (HCC) 11/09/2014  . Abdominal pain, epigastric 11/09/2014  . Right hip pain 11/08/2014  . Acute urinary retention 11/08/2014  . Acute respiratory failure with hypoxia (HCC) 11/08/2014  . Fall   . Hypoxia     Past Surgical History:  Procedure Laterality Date  . ABDOMINAL EXPLORATION SURGERY  02/23/2012  . CATARACT EXTRACTION, BILATERAL    . FRACTURE SURGERY    . GASTRIC ROUX-EN-Y  ~ 1226/2013   closure of duodenal stump w/Roux-en-Y gastro jejunalanastomosis  . HIP FRACTURE SURGERY Right 2000's  . HIP HARDWARE REMOVAL Right ~ 2011  . PARTIAL GASTRECTOMY  2013-2014   distal; w/closure of duodenal stump; gastro jejunalanastomosis  . TRANSURETHRAL RESECTION OF PROSTATE  2016       Home Medications    Prior to Admission medications   Medication Sig Start Date End Date Taking? Authorizing Provider  albuterol (PROVENTIL HFA;VENTOLIN HFA) 108 (90 BASE) MCG/ACT inhaler Inhale 2 puffs into the lungs every 6 (six) hours as needed for wheezing or shortness of breath. 11/12/14  Yes Renae Fickle, MD  apixaban (ELIQUIS) 5 MG TABS tablet Take 1 tablet (5 mg total) by mouth 2 (two) times daily. 06/26/15  Yes Luke K Kilroy, PA-C  budesonide-formoterol (SYMBICORT) 160-4.5 MCG/ACT inhaler Inhale 2 puffs into the lungs 2 (two) times daily. 11/12/14  Yes Renae Fickle, MD  esomeprazole (NEXIUM) 20 MG capsule Take 20 mg by mouth daily at 12 noon.   Yes Historical Provider, MD  formoterol (FORADIL) 12 MCG capsule for inhaler Place 12 mcg into inhaler and inhale every 12 (twelve) hours.   Yes Historical Provider, MD  metoprolol tartrate (LOPRESSOR) 25 MG tablet Take 0.5 tablets (12.5 mg total) by mouth 2 (two) times daily. 11/12/14  Yes Renae Fickle, MD  tamsulosin (FLOMAX) 0.4 MG CAPS capsule Take 1 capsule (0.4 mg total) by mouth daily. 11/12/14  Yes Renae Fickle, MD  oxyCODONE (OXY IR/ROXICODONE) 5 MG  immediate release tablet Take 1 tablet (5 mg total) by mouth every 4 (four) hours as needed for moderate pain or severe pain. Patient not taking: Reported on 01/25/2016 11/12/14   Renae Fickle, MD  pantoprazole (PROTONIX) 40 MG tablet Take 1 tablet (40 mg total) by mouth daily at 6 (six) AM. Patient not taking: Reported on 01/25/2016 11/12/14   Renae Fickle, MD    Family History Family History  Problem Relation Age of Onset  . Arrhythmia Son   . Stroke Brother     Social History Social History  Substance Use Topics  . Smoking status: Former Smoker    Packs/day: 3.00    Years: 50.00    Types: Cigarettes  . Smokeless tobacco: Never Used     Comment: "quit smoking cigarettes in the 1990's"  . Alcohol use No     Allergies   Patient has no known allergies.   Review of Systems Review of Systems  Constitutional: Negative for chills and fever.  HENT: Negative for congestion.   Eyes: Negative for visual disturbance.  Respiratory: Positive for cough and shortness of breath. Negative for wheezing.   Cardiovascular: Negative for chest pain, palpitations and leg swelling.  Gastrointestinal: Positive for abdominal pain and nausea. Negative for vomiting.  Genitourinary: Negative for dysuria.  Musculoskeletal: Negative for back pain and neck pain.  Skin: Negative for color change.  Neurological: Negative for headaches.     Physical Exam Updated Vital Signs BP 128/70   Pulse 66   Temp 97.7 F (36.5 C) (Oral)   Resp (!) 33   SpO2 94%   Physical Exam  Constitutional: He is oriented to person, place, and time. He appears well-developed and well-nourished.  HENT:  Head: Normocephalic and atraumatic.  Cardiovascular: Normal rate and normal heart sounds.   No murmur heard. Pulmonary/Chest: No respiratory distress. He has wheezes.  Increased effort in breathing.   Abdominal: Soft. Bowel sounds are normal. He exhibits no distension.  Midline surgical scar present. No focal  areas of tenderness but patient is tender in the epigastrium as well as left side of abdomen.   Musculoskeletal: He exhibits no edema.  No calf tenderness or leg swelling.   Neurological: He is alert and oriented to person, place, and time.  Skin: Skin is warm and dry.  Nursing note and vitals reviewed.    ED Treatments / Results  Labs (all labs ordered are listed, but only abnormal results are displayed) Labs Reviewed  CBC - Abnormal; Notable for the following:       Result Value   RDW 16.2 (*)    All other components within normal limits  COMPREHENSIVE METABOLIC PANEL - Abnormal; Notable for the following:    Sodium 134 (*)    Chloride 96 (*)    ALT 16 (*)    All other components within normal limits  LIPASE, BLOOD  LACTIC ACID, PLASMA  LACTIC ACID, PLASMA  Rosezena Sensor, ED    EKG  EKG Interpretation  Date/Time:  Friday January 25 2016 09:50:43 EST Ventricular Rate:  77 PR Interval:  148 QRS Duration: 80 QT Interval:  390 QTC Calculation: 441 R Axis:   81 Text Interpretation:  Sinus rhythm with frequent Premature ventricular complexes Otherwise normal ECG Confirmed by Particia Nearing MD, JULIE ([id number]) on 01/25/2016 1:34:03 PM       Radiology Dg Chest 2 View  Result Date: 01/25/2016 CLINICAL DATA:  Shortness of Breath EXAM: CHEST  2 VIEW COMPARISON:  11/08/2014 FINDINGS: Cardiomegaly. Underline COPD. Linear scarring or atelectasis at the right lung base. No effusions. Multiple moderate mid and lower thoracic spine compression fractures, unchanged since prior CT IMPRESSION: COPD.  Cardiomegaly.  No active disease. Electronically Signed   By: Charlett Nose M.D.   On: 01/25/2016 10:58   Ct Angio Chest Pe W Or Wo Contrast  Result Date: 01/25/2016 CLINICAL DATA:  Diffuse abdominal pain, shortness of breath, symptoms for last few days, history hypertension, GERD, COPD, partial gastrectomy for ulcer disease, BPH, atrial fibrillation EXAM: CT ANGIOGRAPHY CHEST CT ABDOMEN AND  PELVIS WITH CONTRAST TECHNIQUE: Multidetector CT imaging of the chest was performed using the standard protocol during bolus administration of intravenous contrast. Multiplanar CT image reconstructions and MIPs were obtained to evaluate the vascular anatomy. Multidetector CT imaging of the abdomen and pelvis was performed using the standard protocol during bolus administration of intravenous contrast. CONTRAST:  100 cc Isovue 370 IV COMPARISON:  CT chest abdomen pelvis 11/09/2014 FINDINGS: CTA CHEST FINDINGS Cardiovascular: Atherosclerotic calcifications aorta and coronary arteries. Aorta normal caliber without aneurysm or dissection. Pulmonary arteries well opacified and patent. No evidence pulmonary embolism. No pericardial effusion. Mediastinum/Nodes: Air-filled esophagus without gross wall thickening. No thoracic adenopathy. Beam hardening artifacts at base of cervical region from patient's arms. Lungs/Pleura: Subsegmental atelectasis RIGHT upper lobe and in both lower lobes. Central peribronchial thickening. No acute infiltrate, pleural effusion or pneumothorax. Musculoskeletal: Osseous demineralization. Numerous thoracic spine compression fractures. Old fractures posterior LEFT ninth through twelfth ribs. Review of the MIP images confirms the above findings. CT ABDOMEN and PELVIS FINDINGS Hepatobiliary: Stable small cyst lateral segment LEFT lobe liver 13 x 12 mm. Gallbladder well distended with areas of scattered slight gallbladder wall thickening/irregularity unchanged from previous exam. No definite calcified gallstones in gallbladder. No biliary dilatation. Pancreas: Normal appearance Spleen: Normal appearance Adrenals/Urinary Tract: Small BILATERAL renal cysts. Adrenal glands, kidneys and ureters otherwise normal appearance. Prostatic enlargement, gland 5.1 x 4.5 x 4.6 cm, containing central calcifications and postsurgical changes of prior TURP. Bladder wall thickening which may reflect chronic outlet  obstruction in the setting of prostatic enlargement. Stomach/Bowel: Postsurgical changes of antrectomy and gastrojejunostomy with Roux-en-Y anastamosis. Colon unremarkable. Smaal bowel loo within a small umbilical hernia without obstruction. Additional small bowel loops within an infraumbilical ventral hernia with questionable hyperemia nd thickening of the small bowel wall, unable to exclude enteritis from any etiology including ischemia. No evidence of bowel obstruction or perforation. Vascular/Lymphatic: Atherosclerotic calcifications aorta, aorta up to 2.9 x 2.9 cm diameter. No adenopathy. Reproductive: N/A Other: No free air free fluid. Musculoskeletal: Osseous demineralization with numerous thoracolumbar spine compression fractures and prior vertebroplasties at L1, L2 and L3. Review of the MIP images confirms the above findings. IMPRESSION: Small umbilical hernia containing a nonobstructed small bowel loop. Infraumbilical ventral hernia containing small bowel loops which demonstrate hyperemia and suspected mild wall thickening, unable to exclude enteritis from any etiology including ischemia. No definite evidence of small bowel  obstruction or perforation. Prostatic enlargement with mild bladder wall thickening which could represent muscular hypertrophy on the basis of chronic outlet obstruction despite prior TURP, recommend correlation with symptoms. Aortic atherosclerosis and coronary arterial calcification. No evidence of pulmonary embolism. Osteoporosis with numerous vertebral and rib fractures. Electronically Signed   By: Ulyses SouthwardMark  Boles M.D.   On: 01/25/2016 12:21   Ct Abdomen Pelvis W Contrast  Result Date: 01/25/2016 CLINICAL DATA:  Diffuse abdominal pain, shortness of breath, symptoms for last few days, history hypertension, GERD, COPD, partial gastrectomy for ulcer disease, BPH, atrial fibrillation EXAM: CT ANGIOGRAPHY CHEST CT ABDOMEN AND PELVIS WITH CONTRAST TECHNIQUE: Multidetector CT imaging of  the chest was performed using the standard protocol during bolus administration of intravenous contrast. Multiplanar CT image reconstructions and MIPs were obtained to evaluate the vascular anatomy. Multidetector CT imaging of the abdomen and pelvis was performed using the standard protocol during bolus administration of intravenous contrast. CONTRAST:  100 cc Isovue 370 IV COMPARISON:  CT chest abdomen pelvis 11/09/2014 FINDINGS: CTA CHEST FINDINGS Cardiovascular: Atherosclerotic calcifications aorta and coronary arteries. Aorta normal caliber without aneurysm or dissection. Pulmonary arteries well opacified and patent. No evidence pulmonary embolism. No pericardial effusion. Mediastinum/Nodes: Air-filled esophagus without gross wall thickening. No thoracic adenopathy. Beam hardening artifacts at base of cervical region from patient's arms. Lungs/Pleura: Subsegmental atelectasis RIGHT upper lobe and in both lower lobes. Central peribronchial thickening. No acute infiltrate, pleural effusion or pneumothorax. Musculoskeletal: Osseous demineralization. Numerous thoracic spine compression fractures. Old fractures posterior LEFT ninth through twelfth ribs. Review of the MIP images confirms the above findings. CT ABDOMEN and PELVIS FINDINGS Hepatobiliary: Stable small cyst lateral segment LEFT lobe liver 13 x 12 mm. Gallbladder well distended with areas of scattered slight gallbladder wall thickening/irregularity unchanged from previous exam. No definite calcified gallstones in gallbladder. No biliary dilatation. Pancreas: Normal appearance Spleen: Normal appearance Adrenals/Urinary Tract: Small BILATERAL renal cysts. Adrenal glands, kidneys and ureters otherwise normal appearance. Prostatic enlargement, gland 5.1 x 4.5 x 4.6 cm, containing central calcifications and postsurgical changes of prior TURP. Bladder wall thickening which may reflect chronic outlet obstruction in the setting of prostatic enlargement.  Stomach/Bowel: Postsurgical changes of antrectomy and gastrojejunostomy with Roux-en-Y anastamosis. Colon unremarkable. Smaal bowel loo within a small umbilical hernia without obstruction. Additional small bowel loops within an infraumbilical ventral hernia with questionable hyperemia nd thickening of the small bowel wall, unable to exclude enteritis from any etiology including ischemia. No evidence of bowel obstruction or perforation. Vascular/Lymphatic: Atherosclerotic calcifications aorta, aorta up to 2.9 x 2.9 cm diameter. No adenopathy. Reproductive: N/A Other: No free air free fluid. Musculoskeletal: Osseous demineralization with numerous thoracolumbar spine compression fractures and prior vertebroplasties at L1, L2 and L3. Review of the MIP images confirms the above findings. IMPRESSION: Small umbilical hernia containing a nonobstructed small bowel loop. Infraumbilical ventral hernia containing small bowel loops which demonstrate hyperemia and suspected mild wall thickening, unable to exclude enteritis from any etiology including ischemia. No definite evidence of small bowel obstruction or perforation. Prostatic enlargement with mild bladder wall thickening which could represent muscular hypertrophy on the basis of chronic outlet obstruction despite prior TURP, recommend correlation with symptoms. Aortic atherosclerosis and coronary arterial calcification. No evidence of pulmonary embolism. Osteoporosis with numerous vertebral and rib fractures. Electronically Signed   By: Ulyses SouthwardMark  Boles M.D.   On: 01/25/2016 12:21    Procedures Procedures (including critical care time)  Medications Ordered in ED Medications  morphine 4 MG/ML injection 4 mg (4 mg Intravenous  Given 01/25/16 1139)  iopamidol (ISOVUE-370) 76 % injection (100 mLs  Contrast Given 01/25/16 1145)  morphine 4 MG/ML injection 4 mg (4 mg Intravenous Given 01/25/16 1333)     Initial Impression / Assessment and Plan / ED Course  I have  reviewed the triage vital signs and the nursing notes.  Pertinent labs & imaging results that were available during my care of the patient were reviewed by me and considered in my medical decision making (see chart for details).  Clinical Course as of Jan 24 1400  Fri Jan 25, 2016  1333 SpO2: 94 % [JH]    Clinical Course User Index [JH] Jacalyn LefevreJulie Haviland, MD   Neil Terry is a 82 y.o. male who presents to ED for abdominal pain and shortness of breath. On exam, patient does have increased effort in breathing, wheezes and tachypnea as well as abdominal tenderness. No peritoneal signs on exam. Patient did have recent 14 hour plane flight - given shortness of breath will obtain CT angio chest to r/o PE. We will also obtain CT abdomen for further evaluation of abdominal pain.  Per medical records review from outside hospital which were brought by patient, patient was admitted on November 10 from the emergency room when he was found to have an incarcerated hernia which led to a small bowel obstruction.  NPO and NG tube, hernia reduced and patient improved. It appears patient was on Eliquis which was discontinued 4 months ago after he had an upper GI bleed. He underwent endoscopy and bleeding site was at the anastomosis between stomach and jejunum that was performed 4 years ago.   Labs reviewed and reassuring.   12:42 PM - Patient discussed with general surgery, Dr. Luisa Hartornett who will evaluate patient. He recommends medical admission for observation and will follow patient in consultation. See his note for further details.   Patient admitted to hospitalist.    Final Clinical Impressions(s) / ED Diagnoses   Final diagnoses:  Abdominal pain  Shortness of breath    New Prescriptions New Prescriptions   No medications on file       [facility] Hospital For ChildrenJaime Pilcher Ward, PA-C 01/25/16 1416    Jacalyn LefevreJulie Haviland, MD 01/25/16 1626

## 2016-01-25 NOTE — ED Triage Notes (Signed)
Pt here with family. They report he has abd pain. Hx of abd surgery. He also reports shortness of breath. Accessory muscle usage noted. Pt tachypnic in triage. Pt recently traveled to [place].

## 2016-01-25 NOTE — Progress Notes (Signed)
Pharmacy Antibiotic Note  Lucienne MinksFarid Burkley is a 82 y.o. male admitted on 01/25/2016 with copd and possible early intrabdominal infection.  Pharmacy has been consulted for zosyn dosing. WBC WNL, creat WNL, AF, Wt 74.5 kg 1 year ago.   Plan: zosyn 3.375 gm over 30 minutes now, followed by Zosyn 3.375g IV q8h (4 hour infusion). Pharmacy to sign off    Temp (24hrs), Avg:97.7 F (36.5 C), Min:97.7 F (36.5 C), Max:97.7 F (36.5 C)   Recent Labs Lab 01/25/16 0952 01/25/16 1541  WBC 7.3  --   CREATININE 0.94  --   LATICACIDVEN  --  0.7    CrCl cannot be calculated (Unknown ideal weight.).    No Known Allergies  Herby AbrahamMichelle T. Starling Jessie, Pharm.D. [phone] 01/25/2016 5:08 PM

## 2016-01-25 NOTE — H&P (Signed)
History and Physical    Neil Terry UJW:[id number] DOB: 04-26-1927 DOA: 01/25/2016  PCP: No PCP Per Patient Patient coming from: home  Chief Complaint: abdominal pain  HPI: Neil Terry is a 82 y.o. male with medical history significant of GI bleed, BPH, COPD, GERD, oxygen dependent, abdominal exploratory laparoscopy and abdominal hernias presenting with two-week history of abdominal pain. Pain started while patient was in [place] visiting the family. Take the hospital there and per report was diagnosed with a bowel obstruction. This was relieved after several day stay in the hospital and remaining but sounds like NPO without aggressive management. Upon returning to the [place] patient's abdominal pain was resolved. This returned however a few hours after eating a very large 6 getting dinner on 01/24/2016. The patient is fairly constant with waxing and waning nature. Throughout entire abdomen. Pain is improved by wearing his binder belt. Associated with some nausea but denies emesis, diarrhea, fevers, chest pain. Of note patients return to the [place] was on 01/15/2016. Of note patient also with persistent intermittently productive cough over the last 2 weeks. Started after arriving in [place]. Patient does use home O2 but is unsure of how much. Associated with some shortness of breath.  History obtained on limited basis by the patient. But, he obtained by patient's son who acts as his primary interpreter.  ED Course: Objective findings outlined below. Morphine given for pain. Surgical consult requested.  Review of Systems: As per HPI otherwise 10 point review of systems negative.   Ambulatory Status: uses a cain   Past Medical History:  Diagnosis Date  . BPH (benign prostatic hypertrophy)   . Bronchial asthma   . COPD (chronic obstructive pulmonary disease) (HCC)   . Depression    hx  . Duodenal ulcer    perforated ~ 2013  . GERD (gastroesophageal reflux disease)   . HTN  (hypertension)   . On home oxygen therapy    "8h/day; don't know how many liters"    Past Surgical History:  Procedure Laterality Date  . ABDOMINAL EXPLORATION SURGERY  02/23/2012  . CATARACT EXTRACTION, BILATERAL    . FRACTURE SURGERY    . GASTRIC ROUX-EN-Y  ~ 1226/2013   closure of duodenal stump w/Roux-en-Y gastro jejunalanastomosis  . HIP FRACTURE SURGERY Right 2000's  . HIP HARDWARE REMOVAL Right ~ 2011  . PARTIAL GASTRECTOMY  2013-2014   distal; w/closure of duodenal stump; gastro jejunalanastomosis  . TRANSURETHRAL RESECTION OF PROSTATE  2016    Social History   Social History  . Marital status: Widowed    Spouse name: N/A  . Number of children: N/A  . Years of education: N/A   Occupational History  . Not on file.   Social History Main Topics  . Smoking status: Former Smoker    Packs/day: 3.00    Years: 50.00    Types: Cigarettes  . Smokeless tobacco: Never Used     Comment: "quit smoking cigarettes in the 1990's"  . Alcohol use No  . Drug use: No  . Sexual activity: Not on file   Other Topics Concern  . Not on file   Social History Narrative  . No narrative on file    No Known Allergies  Family History  Problem Relation Age of Onset  . Arrhythmia Son   . Stroke Brother     Prior to Admission medications   Medication Sig Start Date End Date Taking? Authorizing Provider  albuterol (PROVENTIL HFA;VENTOLIN HFA) 108 (90 BASE) MCG/ACT inhaler  Inhale 2 puffs into the lungs every 6 (six) hours as needed for wheezing or shortness of breath. 11/12/14  Yes Renae Fickle, MD  apixaban (ELIQUIS) 5 MG TABS tablet Take 1 tablet (5 mg total) by mouth 2 (two) times daily. 06/26/15  Yes Luke K Kilroy, PA-C  budesonide-formoterol (SYMBICORT) 160-4.5 MCG/ACT inhaler Inhale 2 puffs into the lungs 2 (two) times daily. 11/12/14  Yes Renae Fickle, MD  esomeprazole (NEXIUM) 20 MG capsule Take 20 mg by mouth daily at 12 noon.   Yes Historical Provider, MD  formoterol  (FORADIL) 12 MCG capsule for inhaler Place 12 mcg into inhaler and inhale every 12 (twelve) hours.   Yes Historical Provider, MD  metoprolol tartrate (LOPRESSOR) 25 MG tablet Take 0.5 tablets (12.5 mg total) by mouth 2 (two) times daily. 11/12/14  Yes Renae Fickle, MD  tamsulosin (FLOMAX) 0.4 MG CAPS capsule Take 1 capsule (0.4 mg total) by mouth daily. 11/12/14  Yes Renae Fickle, MD  oxyCODONE (OXY IR/ROXICODONE) 5 MG immediate release tablet Take 1 tablet (5 mg total) by mouth every 4 (four) hours as needed for moderate pain or severe pain. Patient not taking: Reported on 01/25/2016 11/12/14   Renae Fickle, MD  pantoprazole (PROTONIX) 40 MG tablet Take 1 tablet (40 mg total) by mouth daily at 6 (six) AM. Patient not taking: Reported on 01/25/2016 11/12/14   Renae Fickle, MD    Physical Exam: Vitals:   01/25/16 1100 01/25/16 1230 01/25/16 1300 01/25/16 1330  BP: 116/78 121/73 (!) 128/102 128/70  Pulse: 65 60 68 66  Resp: 25 20 25  (!) 33  Temp:      TempSrc:      SpO2: 97% 93% 93% 94%     General: Mild distress, resting in bed Eyes:  PERRL, EOMI, normal lids, iris ENT: Dry mm, poor dentition Neck:  no LAD, masses or thyromegaly Cardiovascular:  RRR, no m/r/g. No LE edema.  Respiratory: Intermittent rhonchi and wheezes. Diminished breath sounds. Increased effort.  Abdomen: Slight distention. Minimal tenderness with palpation. Normoactive bowel sounds.  Skin:  no rash or induration seen on limited exam Musculoskeletal:  grossly normal tone BUE/BLE, good ROM, no bony abnormality Psychiatric:  grossly normal mood and affect, speech fluent and appropriate, AOx3 Neurologic:  CN 2-12 grossly intact, moves all extremities in coordinated fashion, sensation intact  Labs on Admission: I have personally reviewed following labs and imaging studies  CBC:  Recent Labs Lab 01/25/16 0952  WBC 7.3  HGB 14.7  HCT 45.8  MCV 82.5  PLT 274   Basic Metabolic Panel:  Recent Labs Lab  01/25/16 0952  NA 134*  K 5.0  CL 96*  CO2 30  GLUCOSE 98  BUN 10  CREATININE 0.94  CALCIUM 9.2   GFR: CrCl cannot be calculated (Unknown ideal weight.). Liver Function Tests:  Recent Labs Lab 01/25/16 0952  AST 23  ALT 16*  ALKPHOS 87  BILITOT 0.5  PROT 7.8  ALBUMIN 3.7    Recent Labs Lab 01/25/16 0952  LIPASE 34   No results for input(s): AMMONIA in the last 168 hours. Coagulation Profile: No results for input(s): INR, PROTIME in the last 168 hours. Cardiac Enzymes: No results for input(s): CKTOTAL, CKMB, CKMBINDEX, TROPONINI in the last 168 hours. BNP (last 3 results) No results for input(s): PROBNP in the last 8760 hours. HbA1C: No results for input(s): HGBA1C in the last 72 hours. CBG: No results for input(s): GLUCAP in the last 168 hours. Lipid Profile: No results for  input(s): CHOL, HDL, LDLCALC, TRIG, CHOLHDL, LDLDIRECT in the last 72 hours. Thyroid Function Tests: No results for input(s): TSH, T4TOTAL, FREET4, T3FREE, THYROIDAB in the last 72 hours. Anemia Panel: No results for input(s): VITAMINB12, FOLATE, FERRITIN, TIBC, IRON, RETICCTPCT in the last 72 hours. Urine analysis:    Component Value Date/Time   COLORURINE AMBER (A) 11/08/2014 1608   APPEARANCEUR CLOUDY (A) 11/08/2014 1608   LABSPEC 1.026 11/08/2014 1608   PHURINE 5.0 11/08/2014 1608   GLUCOSEU NEGATIVE 11/08/2014 1608   HGBUR LARGE (A) 11/08/2014 1608   BILIRUBINUR SMALL (A) 11/08/2014 1608   KETONESUR NEGATIVE 11/08/2014 1608   PROTEINUR 100 (A) 11/08/2014 1608   UROBILINOGEN 1.0 11/08/2014 1608   NITRITE NEGATIVE 11/08/2014 1608   LEUKOCYTESUR SMALL (A) 11/08/2014 1608    Creatinine Clearance: CrCl cannot be calculated (Unknown ideal weight.).  Sepsis Labs: @LABRCNTIP (procalcitonin:4,lacticidven:4) )No results found for this or any previous visit (from the past 240 hour(s)).   Radiological Exams on Admission: Dg Chest 2 View  Result Date: 01/25/2016 CLINICAL DATA:   Shortness of Breath EXAM: CHEST  2 VIEW COMPARISON:  11/08/2014 FINDINGS: Cardiomegaly. Underline COPD. Linear scarring or atelectasis at the right lung base. No effusions. Multiple moderate mid and lower thoracic spine compression fractures, unchanged since prior CT IMPRESSION: COPD.  Cardiomegaly.  No active disease. Electronically Signed   By: Charlett Nose M.D.   On: 01/25/2016 10:58   Ct Angio Chest Pe W Or Wo Contrast  Result Date: 01/25/2016 CLINICAL DATA:  Diffuse abdominal pain, shortness of breath, symptoms for last few days, history hypertension, GERD, COPD, partial gastrectomy for ulcer disease, BPH, atrial fibrillation EXAM: CT ANGIOGRAPHY CHEST CT ABDOMEN AND PELVIS WITH CONTRAST TECHNIQUE: Multidetector CT imaging of the chest was performed using the standard protocol during bolus administration of intravenous contrast. Multiplanar CT image reconstructions and MIPs were obtained to evaluate the vascular anatomy. Multidetector CT imaging of the abdomen and pelvis was performed using the standard protocol during bolus administration of intravenous contrast. CONTRAST:  100 cc Isovue 370 IV COMPARISON:  CT chest abdomen pelvis 11/09/2014 FINDINGS: CTA CHEST FINDINGS Cardiovascular: Atherosclerotic calcifications aorta and coronary arteries. Aorta normal caliber without aneurysm or dissection. Pulmonary arteries well opacified and patent. No evidence pulmonary embolism. No pericardial effusion. Mediastinum/Nodes: Air-filled esophagus without gross wall thickening. No thoracic adenopathy. Beam hardening artifacts at base of cervical region from patient's arms. Lungs/Pleura: Subsegmental atelectasis RIGHT upper lobe and in both lower lobes. Central peribronchial thickening. No acute infiltrate, pleural effusion or pneumothorax. Musculoskeletal: Osseous demineralization. Numerous thoracic spine compression fractures. Old fractures posterior LEFT ninth through twelfth ribs. Review of the MIP images  confirms the above findings. CT ABDOMEN and PELVIS FINDINGS Hepatobiliary: Stable small cyst lateral segment LEFT lobe liver 13 x 12 mm. Gallbladder well distended with areas of scattered slight gallbladder wall thickening/irregularity unchanged from previous exam. No definite calcified gallstones in gallbladder. No biliary dilatation. Pancreas: Normal appearance Spleen: Normal appearance Adrenals/Urinary Tract: Small BILATERAL renal cysts. Adrenal glands, kidneys and ureters otherwise normal appearance. Prostatic enlargement, gland 5.1 x 4.5 x 4.6 cm, containing central calcifications and postsurgical changes of prior TURP. Bladder wall thickening which may reflect chronic outlet obstruction in the setting of prostatic enlargement. Stomach/Bowel: Postsurgical changes of antrectomy and gastrojejunostomy with Roux-en-Y anastamosis. Colon unremarkable. Smaal bowel loo within a small umbilical hernia without obstruction. Additional small bowel loops within an infraumbilical ventral hernia with questionable hyperemia nd thickening of the small bowel wall, unable to exclude enteritis from any etiology including  ischemia. No evidence of bowel obstruction or perforation. Vascular/Lymphatic: Atherosclerotic calcifications aorta, aorta up to 2.9 x 2.9 cm diameter. No adenopathy. Reproductive: N/A Other: No free air free fluid. Musculoskeletal: Osseous demineralization with numerous thoracolumbar spine compression fractures and prior vertebroplasties at L1, L2 and L3. Review of the MIP images confirms the above findings. IMPRESSION: Small umbilical hernia containing a nonobstructed small bowel loop. Infraumbilical ventral hernia containing small bowel loops which demonstrate hyperemia and suspected mild wall thickening, unable to exclude enteritis from any etiology including ischemia. No definite evidence of small bowel obstruction or perforation. Prostatic enlargement with mild bladder wall thickening which could represent  muscular hypertrophy on the basis of chronic outlet obstruction despite prior TURP, recommend correlation with symptoms. Aortic atherosclerosis and coronary arterial calcification. No evidence of pulmonary embolism. Osteoporosis with numerous vertebral and rib fractures. Electronically Signed   By: Ulyses Southward M.D.   On: 01/25/2016 12:21   Ct Abdomen Pelvis W Contrast  Result Date: 01/25/2016 CLINICAL DATA:  Diffuse abdominal pain, shortness of breath, symptoms for last few days, history hypertension, GERD, COPD, partial gastrectomy for ulcer disease, BPH, atrial fibrillation EXAM: CT ANGIOGRAPHY CHEST CT ABDOMEN AND PELVIS WITH CONTRAST TECHNIQUE: Multidetector CT imaging of the chest was performed using the standard protocol during bolus administration of intravenous contrast. Multiplanar CT image reconstructions and MIPs were obtained to evaluate the vascular anatomy. Multidetector CT imaging of the abdomen and pelvis was performed using the standard protocol during bolus administration of intravenous contrast. CONTRAST:  100 cc Isovue 370 IV COMPARISON:  CT chest abdomen pelvis 11/09/2014 FINDINGS: CTA CHEST FINDINGS Cardiovascular: Atherosclerotic calcifications aorta and coronary arteries. Aorta normal caliber without aneurysm or dissection. Pulmonary arteries well opacified and patent. No evidence pulmonary embolism. No pericardial effusion. Mediastinum/Nodes: Air-filled esophagus without gross wall thickening. No thoracic adenopathy. Beam hardening artifacts at base of cervical region from patient's arms. Lungs/Pleura: Subsegmental atelectasis RIGHT upper lobe and in both lower lobes. Central peribronchial thickening. No acute infiltrate, pleural effusion or pneumothorax. Musculoskeletal: Osseous demineralization. Numerous thoracic spine compression fractures. Old fractures posterior LEFT ninth through twelfth ribs. Review of the MIP images confirms the above findings. CT ABDOMEN and PELVIS FINDINGS  Hepatobiliary: Stable small cyst lateral segment LEFT lobe liver 13 x 12 mm. Gallbladder well distended with areas of scattered slight gallbladder wall thickening/irregularity unchanged from previous exam. No definite calcified gallstones in gallbladder. No biliary dilatation. Pancreas: Normal appearance Spleen: Normal appearance Adrenals/Urinary Tract: Small BILATERAL renal cysts. Adrenal glands, kidneys and ureters otherwise normal appearance. Prostatic enlargement, gland 5.1 x 4.5 x 4.6 cm, containing central calcifications and postsurgical changes of prior TURP. Bladder wall thickening which may reflect chronic outlet obstruction in the setting of prostatic enlargement. Stomach/Bowel: Postsurgical changes of antrectomy and gastrojejunostomy with Roux-en-Y anastamosis. Colon unremarkable. Smaal bowel loo within a small umbilical hernia without obstruction. Additional small bowel loops within an infraumbilical ventral hernia with questionable hyperemia nd thickening of the small bowel wall, unable to exclude enteritis from any etiology including ischemia. No evidence of bowel obstruction or perforation. Vascular/Lymphatic: Atherosclerotic calcifications aorta, aorta up to 2.9 x 2.9 cm diameter. No adenopathy. Reproductive: N/A Other: No free air free fluid. Musculoskeletal: Osseous demineralization with numerous thoracolumbar spine compression fractures and prior vertebroplasties at L1, L2 and L3. Review of the MIP images confirms the above findings. IMPRESSION: Small umbilical hernia containing a nonobstructed small bowel loop. Infraumbilical ventral hernia containing small bowel loops which demonstrate hyperemia and suspected mild wall thickening, unable to exclude enteritis from  any etiology including ischemia. No definite evidence of small bowel obstruction or perforation. Prostatic enlargement with mild bladder wall thickening which could represent muscular hypertrophy on the basis of chronic outlet  obstruction despite prior TURP, recommend correlation with symptoms. Aortic atherosclerosis and coronary arterial calcification. No evidence of pulmonary embolism. Osteoporosis with numerous vertebral and rib fractures. Electronically Signed   By: Ulyses SouthwardMark  Boles M.D.   On: 01/25/2016 12:21    EKG: Independently reviewed. PVC, sinus, no ACS  Assessment/Plan Active Problems:   COPD with acute exacerbation (HCC)   BPH (benign prostatic hyperplasia)   Hernia, abdominal   GERD without esophagitis   PAF (paroxysmal atrial fibrillation) (HCC)   Complex incisional hernias: CT noted above. H/o previous Ex-lap. Wall thickening noted likely from ischemia vs infectious process. Pt has a very high mortality risk if surgery is undertaken given age and poor respiratory status. Cardiac status to be evaluated as below.  - Decision for surgical mgt per Dr. Luisa Hartornett and [facility] - Lactic acid - trend - Zosyn - Echo - NG tube - COags  PAF: Not on NOAC due to GI bleed. On ASA alone. Last cardiology note states previou Echo w/ nml LVF and LA size w/ myoview showing low risk (11/2014) - Echo as above - continue metop  COPD exacerbation: unsure if pt requiring more than baseline O2 but w/ marked effort at this time w/ wheezing and ronchi and sensation of SOB. CTA chest w/o acute process noted no PE or CAP.  - continue dulera, - Albuterol Q4 - Solumedrol IV 60 Qday - O2 PRN - PFT (aware that in acute setting results may be skewed but may be necessary to evaluate current surgical risk from a respiratory standpoint)  GERD: - IV Pepcid  BPH: - continue flomax when taking po   DVT prophylaxis: SCD  Code Status: FULL  Family Communication: son  Disposition Plan: pending improvmeent in condition or surgical intervention  Consults called: [facility]  Admission status: inpt    Raunel Dimartino J MD [facility]  If 7PM-7AM, please contact night-coverage [email or web address] Password [email or web address]  01/25/2016, 4:51 PM

## 2016-01-25 NOTE — ED Notes (Signed)
Pt. Transferred to 4E11, placed on non-rebreather for transport.  Rt. Bringing the BIPAP up.  Pt. 's pulse ox was 90-91% during transport

## 2016-01-25 NOTE — ED Notes (Signed)
Pt. Has verbalized that his sons may interrupt for him

## 2016-01-26 ENCOUNTER — Inpatient Hospital Stay (HOSPITAL_COMMUNITY): Payer: Medicare Other

## 2016-01-26 DIAGNOSIS — K458 Other specified abdominal hernia without obstruction or gangrene: Secondary | ICD-10-CM

## 2016-01-26 LAB — BASIC METABOLIC PANEL
Anion gap: 9 (ref 5–15)
BUN: 11 mg/dL (ref 6–20)
CALCIUM: 8.9 mg/dL (ref 8.9–10.3)
CO2: 29 mmol/L (ref 22–32)
CREATININE: 0.99 mg/dL (ref 0.61–1.24)
Chloride: 96 mmol/L — ABNORMAL LOW (ref 101–111)
GFR calc Af Amer: >60 mL/min (ref >60)
Glucose, Bld: 144 mg/dL — ABNORMAL HIGH (ref 65–99)
POTASSIUM: 4.8 mmol/L (ref 3.5–5.1)
SODIUM: 134 mmol/L — AB (ref 135–145)

## 2016-01-26 LAB — GLUCOSE, CAPILLARY
Glucose-Capillary: 107 mg/dL — ABNORMAL HIGH (ref 65–99)
Glucose-Capillary: 122 mg/dL — ABNORMAL HIGH (ref 65–99)
Glucose-Capillary: 140 mg/dL — ABNORMAL HIGH (ref 65–99)
Glucose-Capillary: 151 mg/dL — ABNORMAL HIGH (ref 65–99)
Glucose-Capillary: 173 mg/dL — ABNORMAL HIGH (ref 65–99)

## 2016-01-26 LAB — CBC
HCT: 42.4 % (ref 39.0–52.0)
Hemoglobin: 13.3 g/dL (ref 13.0–17.0)
MCH: 25.7 pg — AB (ref 26.0–34.0)
MCHC: 31.4 g/dL (ref 30.0–36.0)
MCV: 82 fL (ref 78.0–100.0)
PLATELETS: 245 10*3/uL (ref 150–400)
RBC: 5.17 MIL/uL (ref 4.22–5.81)
RDW: 16.2 % — ABNORMAL HIGH (ref 11.5–15.5)
WBC: 5.6 10*3/uL (ref 4.0–10.5)

## 2016-01-26 LAB — APTT: aPTT: 34 seconds (ref 24–36)

## 2016-01-26 LAB — PROTIME-INR
INR: 1.13
PROTHROMBIN TIME: 14.6 s (ref 11.4–15.2)

## 2016-01-26 MED ORDER — ORAL CARE MOUTH RINSE
15.0000 mL | Freq: Two times a day (BID) | OROMUCOSAL | Status: DC
  Administered 2016-01-26 – 2016-01-29 (×7): 15 mL via OROMUCOSAL

## 2016-01-26 MED ORDER — ALBUTEROL SULFATE (2.5 MG/3ML) 0.083% IN NEBU: 2.5000 mg | INHALATION_SOLUTION | RESPIRATORY_TRACT | Status: DC | PRN

## 2016-01-26 MED ORDER — ALBUTEROL SULFATE (2.5 MG/3ML) 0.083% IN NEBU
2.5000 mg | INHALATION_SOLUTION | Freq: Four times a day (QID) | RESPIRATORY_TRACT | Status: DC
  Administered 2016-01-26 (×4): 2.5 mg via RESPIRATORY_TRACT
  Filled 2016-01-26 (×4): qty 3

## 2016-01-26 NOTE — Progress Notes (Signed)
Subjective: Patient is sitting up in chair - very short of breath with increased work of breathing No family in room Patient reports less abdominal pain No nausea or vomiting; no NG tube in place  Objective: Vital signs in last 24 hours: Temp:  [97.7 F (36.5 C)-98.1 F (36.7 C)] 98.1 F (36.7 C) (11/25 0525) Pulse Rate:  [50-75] 67 (11/25 0525) Resp:  [15-36] 16 (11/25 0525) BP: (97-133)/(44-102) 118/60 (11/25 0525) SpO2:  [90 %-99 %] 90 % (11/25 0525) Weight:  [73.7 kg (162 lb 6.4 oz)] 73.7 kg (162 lb 6.4 oz) (11/25 0035) Last BM Date:  (Pt cannot answer)  Intake/Output from previous day: 11/24 0701 - 11/25 0700 In: 559.2 [I.V.:459.2; IV Piggyback:100] Out: 200 [Urine:200] Intake/Output this shift: No intake/output data recorded.  General appearance: alert, cooperative, mild distress and increased work of breathing Eyes: PERRL; mild scleral icterus Resp: bilateral wheezing; tachypneic; using accessory muscles Cardio: regularly irregular rhythm GI: multiple soft ventral hernias; the only area of tenderness is to the left of lower midline; active bowel sounds  Lab Results:   Recent Labs  01/25/16 0952 01/26/16 0533  WBC 7.3 5.6  HGB 14.7 13.3  HCT 45.8 42.4  PLT 274 245   BMET  Recent Labs  01/25/16 0952 01/26/16 0533  NA 134* 134*  K 5.0 4.8  CL 96* 96*  CO2 30 29  GLUCOSE 98 144*  BUN 10 11  CREATININE 0.94 0.99  CALCIUM 9.2 8.9   PT/INR  Recent Labs  01/26/16 0533  LABPROT 14.6  INR 1.13    Lactic Acid, Venous    Component Value Date/Time   LATICACIDVEN 0.7 01/25/2016 1541    ABG No results for input(s): PHART, HCO3 in the last 72 hours.  Invalid input(s): PCO2, PO2  Studies/Results: Dg Chest 2 View  Result Date: 01/25/2016 CLINICAL DATA:  Shortness of Breath EXAM: CHEST  2 VIEW COMPARISON:  11/08/2014 FINDINGS: Cardiomegaly. Underline COPD. Linear scarring or atelectasis at the right lung base. No effusions. Multiple moderate  mid and lower thoracic spine compression fractures, unchanged since prior CT IMPRESSION: COPD.  Cardiomegaly.  No active disease. Electronically Signed   By: Charlett NoseKevin  Dover M.D.   On: 01/25/2016 10:58   Ct Angio Chest Pe W Or Wo Contrast  Result Date: 01/25/2016 CLINICAL DATA:  Diffuse abdominal pain, shortness of breath, symptoms for last few days, history hypertension, GERD, COPD, partial gastrectomy for ulcer disease, BPH, atrial fibrillation EXAM: CT ANGIOGRAPHY CHEST CT ABDOMEN AND PELVIS WITH CONTRAST TECHNIQUE: Multidetector CT imaging of the chest was performed using the standard protocol during bolus administration of intravenous contrast. Multiplanar CT image reconstructions and MIPs were obtained to evaluate the vascular anatomy. Multidetector CT imaging of the abdomen and pelvis was performed using the standard protocol during bolus administration of intravenous contrast. CONTRAST:  100 cc Isovue 370 IV COMPARISON:  CT chest abdomen pelvis 11/09/2014 FINDINGS: CTA CHEST FINDINGS Cardiovascular: Atherosclerotic calcifications aorta and coronary arteries. Aorta normal caliber without aneurysm or dissection. Pulmonary arteries well opacified and patent. No evidence pulmonary embolism. No pericardial effusion. Mediastinum/Nodes: Air-filled esophagus without gross wall thickening. No thoracic adenopathy. Beam hardening artifacts at base of cervical region from patient's arms. Lungs/Pleura: Subsegmental atelectasis RIGHT upper lobe and in both lower lobes. Central peribronchial thickening. No acute infiltrate, pleural effusion or pneumothorax. Musculoskeletal: Osseous demineralization. Numerous thoracic spine compression fractures. Old fractures posterior LEFT ninth through twelfth ribs. Review of the MIP images confirms the above findings. CT ABDOMEN and PELVIS FINDINGS Hepatobiliary:  Stable small cyst lateral segment LEFT lobe liver 13 x 12 mm. Gallbladder well distended with areas of scattered slight  gallbladder wall thickening/irregularity unchanged from previous exam. No definite calcified gallstones in gallbladder. No biliary dilatation. Pancreas: Normal appearance Spleen: Normal appearance Adrenals/Urinary Tract: Small BILATERAL renal cysts. Adrenal glands, kidneys and ureters otherwise normal appearance. Prostatic enlargement, gland 5.1 x 4.5 x 4.6 cm, containing central calcifications and postsurgical changes of prior TURP. Bladder wall thickening which may reflect chronic outlet obstruction in the setting of prostatic enlargement. Stomach/Bowel: Postsurgical changes of antrectomy and gastrojejunostomy with Roux-en-Y anastamosis. Colon unremarkable. Smaal bowel loo within a small umbilical hernia without obstruction. Additional small bowel loops within an infraumbilical ventral hernia with questionable hyperemia nd thickening of the small bowel wall, unable to exclude enteritis from any etiology including ischemia. No evidence of bowel obstruction or perforation. Vascular/Lymphatic: Atherosclerotic calcifications aorta, aorta up to 2.9 x 2.9 cm diameter. No adenopathy. Reproductive: N/A Other: No free air free fluid. Musculoskeletal: Osseous demineralization with numerous thoracolumbar spine compression fractures and prior vertebroplasties at L1, L2 and L3. Review of the MIP images confirms the above findings. IMPRESSION: Small umbilical hernia containing a nonobstructed small bowel loop. Infraumbilical ventral hernia containing small bowel loops which demonstrate hyperemia and suspected mild wall thickening, unable to exclude enteritis from any etiology including ischemia. No definite evidence of small bowel obstruction or perforation. Prostatic enlargement with mild bladder wall thickening which could represent muscular hypertrophy on the basis of chronic outlet obstruction despite prior TURP, recommend correlation with symptoms. Aortic atherosclerosis and coronary arterial calcification. No evidence of  pulmonary embolism. Osteoporosis with numerous vertebral and rib fractures. Electronically Signed   By: Ulyses SouthwardMark  Boles M.D.   On: 01/25/2016 12:21   Ct Abdomen Pelvis W Contrast  Result Date: 01/25/2016 CLINICAL DATA:  Diffuse abdominal pain, shortness of breath, symptoms for last few days, history hypertension, GERD, COPD, partial gastrectomy for ulcer disease, BPH, atrial fibrillation EXAM: CT ANGIOGRAPHY CHEST CT ABDOMEN AND PELVIS WITH CONTRAST TECHNIQUE: Multidetector CT imaging of the chest was performed using the standard protocol during bolus administration of intravenous contrast. Multiplanar CT image reconstructions and MIPs were obtained to evaluate the vascular anatomy. Multidetector CT imaging of the abdomen and pelvis was performed using the standard protocol during bolus administration of intravenous contrast. CONTRAST:  100 cc Isovue 370 IV COMPARISON:  CT chest abdomen pelvis 11/09/2014 FINDINGS: CTA CHEST FINDINGS Cardiovascular: Atherosclerotic calcifications aorta and coronary arteries. Aorta normal caliber without aneurysm or dissection. Pulmonary arteries well opacified and patent. No evidence pulmonary embolism. No pericardial effusion. Mediastinum/Nodes: Air-filled esophagus without gross wall thickening. No thoracic adenopathy. Beam hardening artifacts at base of cervical region from patient's arms. Lungs/Pleura: Subsegmental atelectasis RIGHT upper lobe and in both lower lobes. Central peribronchial thickening. No acute infiltrate, pleural effusion or pneumothorax. Musculoskeletal: Osseous demineralization. Numerous thoracic spine compression fractures. Old fractures posterior LEFT ninth through twelfth ribs. Review of the MIP images confirms the above findings. CT ABDOMEN and PELVIS FINDINGS Hepatobiliary: Stable small cyst lateral segment LEFT lobe liver 13 x 12 mm. Gallbladder well distended with areas of scattered slight gallbladder wall thickening/irregularity unchanged from previous  exam. No definite calcified gallstones in gallbladder. No biliary dilatation. Pancreas: Normal appearance Spleen: Normal appearance Adrenals/Urinary Tract: Small BILATERAL renal cysts. Adrenal glands, kidneys and ureters otherwise normal appearance. Prostatic enlargement, gland 5.1 x 4.5 x 4.6 cm, containing central calcifications and postsurgical changes of prior TURP. Bladder wall thickening which may reflect chronic outlet obstruction in the  setting of prostatic enlargement. Stomach/Bowel: Postsurgical changes of antrectomy and gastrojejunostomy with Roux-en-Y anastamosis. Colon unremarkable. Smaal bowel loo within a small umbilical hernia without obstruction. Additional small bowel loops within an infraumbilical ventral hernia with questionable hyperemia nd thickening of the small bowel wall, unable to exclude enteritis from any etiology including ischemia. No evidence of bowel obstruction or perforation. Vascular/Lymphatic: Atherosclerotic calcifications aorta, aorta up to 2.9 x 2.9 cm diameter. No adenopathy. Reproductive: N/A Other: No free air free fluid. Musculoskeletal: Osseous demineralization with numerous thoracolumbar spine compression fractures and prior vertebroplasties at L1, L2 and L3. Review of the MIP images confirms the above findings. IMPRESSION: Small umbilical hernia containing a nonobstructed small bowel loop. Infraumbilical ventral hernia containing small bowel loops which demonstrate hyperemia and suspected mild wall thickening, unable to exclude enteritis from any etiology including ischemia. No definite evidence of small bowel obstruction or perforation. Prostatic enlargement with mild bladder wall thickening which could represent muscular hypertrophy on the basis of chronic outlet obstruction despite prior TURP, recommend correlation with symptoms. Aortic atherosclerosis and coronary arterial calcification. No evidence of pulmonary embolism. Osteoporosis with numerous vertebral and rib  fractures. Electronically Signed   By: Ulyses Southward M.D.   On: 01/25/2016 12:21    Anti-infectives: Anti-infectives    Start     Dose/Rate Route Frequency Ordered Stop   01/26/16 0000  piperacillin-tazobactam (ZOSYN) IVPB 3.375 g     3.375 g 12.5 mL/hr over 240 Minutes Intravenous Every 8 hours 01/25/16 1654     01/25/16 1700  piperacillin-tazobactam (ZOSYN) IVPB 3.375 g     3.375 g 100 mL/hr over 30 Minutes Intravenous  Once 01/25/16 1654 01/25/16 2001      Assessment/Plan: Multiple chronic ventral incisional hernias with chronic small bowel incarceration but no sign of obstruction Severe COPD - home oxygen use; increased work of breathing even at rest Atrial fibrillation - not anticoagulated  Patient's abdominal examination seems to be improved from admission.  No sign of obstruction or peritonitis.  He continues to be at very high risk for perioperative complications/ death.  According to the ACS Surgical Risk calculator, his risk of complication is over 30%.   Would try to manage him non-operatively for now - will apply abdominal binder.  He is not obstructed and has no signs of peritonitis, so there are no urgent indications for surgery.    LOS: 1 day    Yatziry Deakins K. 01/26/2016

## 2016-01-26 NOTE — Progress Notes (Signed)
[facility] PROGRESS NOTE  Lucienne MinksFarid Armel ZOX:[id number] DOB: 1927/09/16 DOA: 01/25/2016  PCP: No PCP Per Patient  Brief History/Interval Summary: 82 year old male from old and who does not speak much English with a past medical history of atrial fibrillation on anticoagulation, BPH, COPD, respiratory failure, oxygen dependent, was hospitalized when he was in [place] visiting family. Apparently, he was diagnosed with small bowel obstruction. He improved without any surgical intervention. The symptoms got better. He came back to the [place]. And then started developing pain and so he came into the hospital.  Reason for Visit: Complicated hernia with abdominal pain.  Consultants: Gen. surgery  Procedures: None yet  Antibiotics: Zosyn  Subjective/Interval History: Patient's 3 children were in the room. They were able to interpret. Patient overall feels better. Denies any chest pain. Has had some nausea but no vomiting. Abdominal pain persists. Hasn't passed any gas from below.  ROS: Denies any headaches  Objective:  Vital Signs  Vitals:   01/26/16 0525 01/26/16 0918 01/26/16 1000 01/26/16 1224  BP: 118/60  115/69   Pulse: 67  91   Resp: 16  18   Temp: 98.1 F (36.7 C)  98.4 F (36.9 C)   TempSrc: Oral  Oral   SpO2: 90% 95% 96% 91%  Weight:        Intake/Output Summary (Last 24 hours) at 01/26/16 1346 Last data filed at 01/26/16 0900  Gross per 24 hour  Intake           559.17 ml  Output              300 ml  Net           259.17 ml   Filed Weights   01/26/16 0035  Weight: 73.7 kg (162 lb 6.4 oz)    General appearance: alert, cooperative, appears stated age and no distress Resp: Mildly tachypneic. Coarse breath sounds bilaterally. No wheezing or rhonchi. Few crackles at the bases. Cardio: regular rate and rhythm, S1, S2 normal, no murmur, click, rub or gallop GI: Abdomen is soft for the most part. Not distended. Mild tenderness in the left side without  any rebound, rigidity or guarding. No masses or organomegaly. Scar from previous surgery is noted. Extremities: extremities normal, atraumatic, no cyanosis or edema Neurologic: Awake and alert. No focal neurologic deficits.  Lab Results:  Data Reviewed: I have personally reviewed following labs and imaging studies  CBC:  Recent Labs Lab 01/25/16 0952 01/26/16 0533  WBC 7.3 5.6  HGB 14.7 13.3  HCT 45.8 42.4  MCV 82.5 82.0  PLT 274 245    Basic Metabolic Panel:  Recent Labs Lab 01/25/16 0952 01/26/16 0533  NA 134* 134*  K 5.0 4.8  CL 96* 96*  CO2 30 29  GLUCOSE 98 144*  BUN 10 11  CREATININE 0.94 0.99  CALCIUM 9.2 8.9    GFR: CrCl cannot be calculated (Unknown ideal weight.).  Liver Function Tests:  Recent Labs Lab 01/25/16 0952  AST 23  ALT 16*  ALKPHOS 87  BILITOT 0.5  PROT 7.8  ALBUMIN 3.7     Recent Labs Lab 01/25/16 0952  LIPASE 34   Coagulation Profile:  Recent Labs Lab 01/26/16 0533  INR 1.13    CBG:  Recent Labs Lab 01/25/16 1919 01/25/16 2217 01/26/16 0032 01/26/16 0745 01/26/16 1141  GLUCAP 91 101* 107* 173* 151*    Radiology Studies: Dg Chest 2 View  Result Date: 01/25/2016 CLINICAL DATA:  Shortness of Breath EXAM:  CHEST  2 VIEW COMPARISON:  11/08/2014 FINDINGS: Cardiomegaly. Underline COPD. Linear scarring or atelectasis at the right lung base. No effusions. Multiple moderate mid and lower thoracic spine compression fractures, unchanged since prior CT IMPRESSION: COPD.  Cardiomegaly.  No active disease. Electronically Signed   By: Charlett Nose M.D.   On: 01/25/2016 10:58   Ct Angio Chest Pe W Or Wo Contrast  Result Date: 01/25/2016 CLINICAL DATA:  Diffuse abdominal pain, shortness of breath, symptoms for last few days, history hypertension, GERD, COPD, partial gastrectomy for ulcer disease, BPH, atrial fibrillation EXAM: CT ANGIOGRAPHY CHEST CT ABDOMEN AND PELVIS WITH CONTRAST TECHNIQUE: Multidetector CT imaging of the  chest was performed using the standard protocol during bolus administration of intravenous contrast. Multiplanar CT image reconstructions and MIPs were obtained to evaluate the vascular anatomy. Multidetector CT imaging of the abdomen and pelvis was performed using the standard protocol during bolus administration of intravenous contrast. CONTRAST:  100 cc Isovue 370 IV COMPARISON:  CT chest abdomen pelvis 11/09/2014 FINDINGS: CTA CHEST FINDINGS Cardiovascular: Atherosclerotic calcifications aorta and coronary arteries. Aorta normal caliber without aneurysm or dissection. Pulmonary arteries well opacified and patent. No evidence pulmonary embolism. No pericardial effusion. Mediastinum/Nodes: Air-filled esophagus without gross wall thickening. No thoracic adenopathy. Beam hardening artifacts at base of cervical region from patient's arms. Lungs/Pleura: Subsegmental atelectasis RIGHT upper lobe and in both lower lobes. Central peribronchial thickening. No acute infiltrate, pleural effusion or pneumothorax. Musculoskeletal: Osseous demineralization. Numerous thoracic spine compression fractures. Old fractures posterior LEFT ninth through twelfth ribs. Review of the MIP images confirms the above findings. CT ABDOMEN and PELVIS FINDINGS Hepatobiliary: Stable small cyst lateral segment LEFT lobe liver 13 x 12 mm. Gallbladder well distended with areas of scattered slight gallbladder wall thickening/irregularity unchanged from previous exam. No definite calcified gallstones in gallbladder. No biliary dilatation. Pancreas: Normal appearance Spleen: Normal appearance Adrenals/Urinary Tract: Small BILATERAL renal cysts. Adrenal glands, kidneys and ureters otherwise normal appearance. Prostatic enlargement, gland 5.1 x 4.5 x 4.6 cm, containing central calcifications and postsurgical changes of prior TURP. Bladder wall thickening which may reflect chronic outlet obstruction in the setting of prostatic enlargement. Stomach/Bowel:  Postsurgical changes of antrectomy and gastrojejunostomy with Roux-en-Y anastamosis. Colon unremarkable. Smaal bowel loo within a small umbilical hernia without obstruction. Additional small bowel loops within an infraumbilical ventral hernia with questionable hyperemia nd thickening of the small bowel wall, unable to exclude enteritis from any etiology including ischemia. No evidence of bowel obstruction or perforation. Vascular/Lymphatic: Atherosclerotic calcifications aorta, aorta up to 2.9 x 2.9 cm diameter. No adenopathy. Reproductive: N/A Other: No free air free fluid. Musculoskeletal: Osseous demineralization with numerous thoracolumbar spine compression fractures and prior vertebroplasties at L1, L2 and L3. Review of the MIP images confirms the above findings. IMPRESSION: Small umbilical hernia containing a nonobstructed small bowel loop. Infraumbilical ventral hernia containing small bowel loops which demonstrate hyperemia and suspected mild wall thickening, unable to exclude enteritis from any etiology including ischemia. No definite evidence of small bowel obstruction or perforation. Prostatic enlargement with mild bladder wall thickening which could represent muscular hypertrophy on the basis of chronic outlet obstruction despite prior TURP, recommend correlation with symptoms. Aortic atherosclerosis and coronary arterial calcification. No evidence of pulmonary embolism. Osteoporosis with numerous vertebral and rib fractures. Electronically Signed   By: Ulyses Southward M.D.   On: 01/25/2016 12:21   Ct Abdomen Pelvis W Contrast  Result Date: 01/25/2016 CLINICAL DATA:  Diffuse abdominal pain, shortness of breath, symptoms for last few days, history  hypertension, GERD, COPD, partial gastrectomy for ulcer disease, BPH, atrial fibrillation EXAM: CT ANGIOGRAPHY CHEST CT ABDOMEN AND PELVIS WITH CONTRAST TECHNIQUE: Multidetector CT imaging of the chest was performed using the standard protocol during bolus  administration of intravenous contrast. Multiplanar CT image reconstructions and MIPs were obtained to evaluate the vascular anatomy. Multidetector CT imaging of the abdomen and pelvis was performed using the standard protocol during bolus administration of intravenous contrast. CONTRAST:  100 cc Isovue 370 IV COMPARISON:  CT chest abdomen pelvis 11/09/2014 FINDINGS: CTA CHEST FINDINGS Cardiovascular: Atherosclerotic calcifications aorta and coronary arteries. Aorta normal caliber without aneurysm or dissection. Pulmonary arteries well opacified and patent. No evidence pulmonary embolism. No pericardial effusion. Mediastinum/Nodes: Air-filled esophagus without gross wall thickening. No thoracic adenopathy. Beam hardening artifacts at base of cervical region from patient's arms. Lungs/Pleura: Subsegmental atelectasis RIGHT upper lobe and in both lower lobes. Central peribronchial thickening. No acute infiltrate, pleural effusion or pneumothorax. Musculoskeletal: Osseous demineralization. Numerous thoracic spine compression fractures. Old fractures posterior LEFT ninth through twelfth ribs. Review of the MIP images confirms the above findings. CT ABDOMEN and PELVIS FINDINGS Hepatobiliary: Stable small cyst lateral segment LEFT lobe liver 13 x 12 mm. Gallbladder well distended with areas of scattered slight gallbladder wall thickening/irregularity unchanged from previous exam. No definite calcified gallstones in gallbladder. No biliary dilatation. Pancreas: Normal appearance Spleen: Normal appearance Adrenals/Urinary Tract: Small BILATERAL renal cysts. Adrenal glands, kidneys and ureters otherwise normal appearance. Prostatic enlargement, gland 5.1 x 4.5 x 4.6 cm, containing central calcifications and postsurgical changes of prior TURP. Bladder wall thickening which may reflect chronic outlet obstruction in the setting of prostatic enlargement. Stomach/Bowel: Postsurgical changes of antrectomy and gastrojejunostomy  with Roux-en-Y anastamosis. Colon unremarkable. Smaal bowel loo within a small umbilical hernia without obstruction. Additional small bowel loops within an infraumbilical ventral hernia with questionable hyperemia nd thickening of the small bowel wall, unable to exclude enteritis from any etiology including ischemia. No evidence of bowel obstruction or perforation. Vascular/Lymphatic: Atherosclerotic calcifications aorta, aorta up to 2.9 x 2.9 cm diameter. No adenopathy. Reproductive: N/A Other: No free air free fluid. Musculoskeletal: Osseous demineralization with numerous thoracolumbar spine compression fractures and prior vertebroplasties at L1, L2 and L3. Review of the MIP images confirms the above findings. IMPRESSION: Small umbilical hernia containing a nonobstructed small bowel loop. Infraumbilical ventral hernia containing small bowel loops which demonstrate hyperemia and suspected mild wall thickening, unable to exclude enteritis from any etiology including ischemia. No definite evidence of small bowel obstruction or perforation. Prostatic enlargement with mild bladder wall thickening which could represent muscular hypertrophy on the basis of chronic outlet obstruction despite prior TURP, recommend correlation with symptoms. Aortic atherosclerosis and coronary arterial calcification. No evidence of pulmonary embolism. Osteoporosis with numerous vertebral and rib fractures. Electronically Signed   By: Ulyses Southward M.D.   On: 01/25/2016 12:21   Dg Abd Portable 1v  Result Date: 01/26/2016 CLINICAL DATA:  Incisional hernia EXAM: PORTABLE ABDOMEN - 1 VIEW COMPARISON:  11/24/ 17 FINDINGS: There is normal small bowel gas pattern. Moderate stool noted in right colon. Multilevel prior vertebroplasty noted lumbar spine. IMPRESSION: Normal small bowel gas pattern. Moderate stool noted in right colon. Electronically Signed   By: Natasha Mead M.D.   On: 01/26/2016 11:09     Medications:  Scheduled: . albuterol   2.5 mg Nebulization QID  . famotidine (PEPCID) IV  20 mg Intravenous Q12H  . methylPREDNISolone (SOLU-MEDROL) injection  60 mg Intravenous Q24H  . mometasone-formoterol  2 puff  Inhalation BID  . piperacillin-tazobactam (ZOSYN)  IV  3.375 g Intravenous Q8H   Continuous: . dextrose 5 % and 0.45% NaCl 50 mL/hr at 01/25/16 1749   UJW:[id number]:albuterol, ondansetron **OR** ondansetron (ZOFRAN) IV  Assessment/Plan:  Active Problems:   COPD with acute exacerbation (HCC)   BPH (benign prostatic hyperplasia)   Hernia, abdominal   GERD without esophagitis   PAF (paroxysmal atrial fibrillation) (HCC)   Incisional hernia    Complex incisional hernias Report of the CT scan noted. H/o previous Ex-lap. Wall thickening noted likely from ischemia vs infectious process. Pt has a very high mortality risk if surgery is undertaken given age and poor respiratory status. General surgery is following. Lactic acid normal. Continue Zosyn for now. Abdominal binder ordered by general surgery. No plans for any surgical intervention at this time. Patient pulled his NG out earlier this morning. We will leave it out for now since he is not vomiting. Explained to him that we will have to put it back in if his symptoms get worse.  PAF There is some confusion regarding whether the patient is taking a Apixaban or not. Apparently he was asked to take a lower dose by his providers at [place] due to some bleeding issues. According to his son's his last dose was 2 days ago. Last cardiology note states previous Echo w/ nml LVF and LA size w/ myoview showing low risk (11/2014). Echocardiogram has been ordered. Continue metoprolol. Anticoagulation on hold as of now.  COPD with mild acute exacerbation Patient states that his breathing is better. Patient is on nebulizer treatments. He is on once a day, IV steroids, which will be continued. CT scan of the chest did not show any PE.   GERD IV Pepcid  BPH: Continue flomax when taking  po  DVT Prophylaxis: SCDs    Code Status: Full code  Family Communication: Scheduled the patient and his children  Disposition Plan: Continue management as outlined above. Leave nothing by mouth.    LOS: 1 day   [facility] System - Perry PointKRISHNAN,Raley Novicki  [facility] Pager [phone] 01/26/2016, 1:46 PM  If 7PM-7AM, please contact night-coverage at [email or web address], password [email or web address]

## 2016-01-26 NOTE — Progress Notes (Signed)
New Admission Note:  Arrival Method: Bed from ED around 0030 Mental Orientation: Alert and oriented. Speaks some English, native language is Arabic.  Telemetry: On tele NSR, box 6E24 Assessment: Completed Skin: Assessed with Zenab M. Rn, refer to flowsheets Iv: Left forearm D5 1/2 NS at 50 mL/hr Pain: Pain with movement Tubes: NG tube Safety Measures: Safety Fall Prevention Plan was given and discussed. Admission: Completed [address] Orientation: Patient has been orientated to the room, unit and the staff. Family: None present  Orders have been reviewed and implemented. Will continue to monitor the patient. Call light has been placed within reach.   Unable to complete all of admission questions d/t language barrier.   Alfonse Rashristy Leslye Puccini, RN  Phone Number: [phone]

## 2016-01-27 LAB — BASIC METABOLIC PANEL
Anion gap: 7 (ref 5–15)
BUN: 14 mg/dL (ref 6–20)
CHLORIDE: 100 mmol/L — AB (ref 101–111)
CO2: 28 mmol/L (ref 22–32)
CREATININE: 0.88 mg/dL (ref 0.61–1.24)
Calcium: 8.6 mg/dL — ABNORMAL LOW (ref 8.9–10.3)
GFR calc non Af Amer: >60 mL/min (ref >60)
Glucose, Bld: 154 mg/dL — ABNORMAL HIGH (ref 65–99)
POTASSIUM: 4 mmol/L (ref 3.5–5.1)
SODIUM: 135 mmol/L (ref 135–145)

## 2016-01-27 LAB — CBC
HEMATOCRIT: 40.5 % (ref 39.0–52.0)
HEMOGLOBIN: 13.2 g/dL (ref 13.0–17.0)
MCH: 26.2 pg (ref 26.0–34.0)
MCHC: 32.6 g/dL (ref 30.0–36.0)
MCV: 80.5 fL (ref 78.0–100.0)
Platelets: 254 10*3/uL (ref 150–400)
RBC: 5.03 MIL/uL (ref 4.22–5.81)
RDW: 16.2 % — ABNORMAL HIGH (ref 11.5–15.5)
WBC: 5.8 10*3/uL (ref 4.0–10.5)

## 2016-01-27 LAB — GLUCOSE, CAPILLARY
GLUCOSE-CAPILLARY: 152 mg/dL — AB (ref 65–99)
GLUCOSE-CAPILLARY: 127 mg/dL — AB (ref 65–99)
GLUCOSE-CAPILLARY: 86 mg/dL (ref 65–99)
Glucose-Capillary: 137 mg/dL — ABNORMAL HIGH (ref 65–99)

## 2016-01-27 MED ORDER — METOPROLOL TARTRATE 5 MG/5ML IV SOLN
5.0000 mg | Freq: Once | INTRAVENOUS | Status: DC
  Filled 2016-01-27: qty 5

## 2016-01-27 MED ORDER — METOPROLOL TARTRATE 12.5 MG HALF TABLET
12.5000 mg | ORAL_TABLET | Freq: Two times a day (BID) | ORAL | Status: DC
  Administered 2016-01-27 – 2016-01-30 (×6): 12.5 mg via ORAL
  Filled 2016-01-27 (×6): qty 1

## 2016-01-27 MED ORDER — METOPROLOL TARTRATE 5 MG/5ML IV SOLN: 5.0000 mg | Freq: Four times a day (QID) | INTRAVENOUS | Status: DC

## 2016-01-27 MED ORDER — ALBUTEROL SULFATE (2.5 MG/3ML) 0.083% IN NEBU
2.5000 mg | INHALATION_SOLUTION | Freq: Two times a day (BID) | RESPIRATORY_TRACT | Status: DC
  Administered 2016-01-27 – 2016-01-28 (×4): 2.5 mg via RESPIRATORY_TRACT
  Filled 2016-01-27 (×4): qty 3

## 2016-01-27 MED ORDER — POLYETHYLENE GLYCOL 3350 17 G PO PACK
17.0000 g | PACK | Freq: Once | ORAL | Status: AC
  Administered 2016-01-27: 17 g via ORAL
  Filled 2016-01-27: qty 1

## 2016-01-27 MED ORDER — METOPROLOL TARTRATE 5 MG/5ML IV SOLN: 2.5000 mg | Freq: Four times a day (QID) | INTRAVENOUS | Status: DC

## 2016-01-27 NOTE — Progress Notes (Signed)
Orthopedic Tech Progress Note Patient Details:  Lucienne MinksFarid Narasimhan 04-26-1927 [id number]  Ortho Devices Type of Ortho Device: Abdominal binder Ortho Device/Splint Location: abdomen Ortho Device/Splint Interventions: Freeman CaldronOrdered   Eudora Guevarra 01/27/2016, 3:07 PM

## 2016-01-27 NOTE — Progress Notes (Signed)
Subjective: Resting comfortably Wants to have something to drink No BM recorded yesterday  Objective: Vital signs in last 24 hours: Temp:  [97.5 F (36.4 C)-98.6 F (37 C)] 97.5 F (36.4 C) (11/26 0506) Pulse Rate:  [65-91] 65 (11/26 0506) Resp:  [18] 18 (11/26 0506) BP: (114-138)/(61-71) 114/66 (11/26 0506) SpO2:  [91 %-98 %] 94 % (11/26 0506) Weight:  [74.1 kg (163 lb 5.8 oz)] 74.1 kg (163 lb 5.8 oz) (11/25 2044) Last BM Date:  (Pt cannot answer)  Intake/Output from previous day: 11/25 0701 - 11/26 0700 In: 1400 [I.V.:1200; IV Piggyback:200] Out: 975 [Urine:975] Intake/Output this shift: Total I/O In: -  Out: 200 [Urine:200]  WDWN - resting comfortably Mild scleral icterus Resp:  Faint bilateral wheezes; tachypneic; accessory muscles Card - regularly irregular GI - soft; hernias reduced when supine; minimal tenderness; + BS  Lab Results:   Recent Labs  01/26/16 0533 01/27/16 0742  WBC 5.6 5.8  HGB 13.3 13.2  HCT 42.4 40.5  PLT 245 254   BMET  Recent Labs  01/26/16 0533 01/27/16 0742  NA 134* 135  K 4.8 4.0  CL 96* 100*  CO2 29 28  GLUCOSE 144* 154*  BUN 11 14  CREATININE 0.99 0.88  CALCIUM 8.9 8.6*   PT/INR  Recent Labs  01/26/16 0533  LABPROT 14.6  INR 1.13   ABG No results for input(s): PHART, HCO3 in the last 72 hours.  Invalid input(s): PCO2, PO2  Studies/Results: Dg Chest 2 View  Result Date: 01/25/2016 CLINICAL DATA:  Shortness of Breath EXAM: CHEST  2 VIEW COMPARISON:  11/08/2014 FINDINGS: Cardiomegaly. Underline COPD. Linear scarring or atelectasis at the right lung base. No effusions. Multiple moderate mid and lower thoracic spine compression fractures, unchanged since prior CT IMPRESSION: COPD.  Cardiomegaly.  No active disease. Electronically Signed   By: Charlett NoseKevin  Dover M.D.   On: 01/25/2016 10:58   Ct Angio Chest Pe W Or Wo Contrast  Result Date: 01/25/2016 CLINICAL DATA:  Diffuse abdominal pain, shortness of breath,  symptoms for last few days, history hypertension, GERD, COPD, partial gastrectomy for ulcer disease, BPH, atrial fibrillation EXAM: CT ANGIOGRAPHY CHEST CT ABDOMEN AND PELVIS WITH CONTRAST TECHNIQUE: Multidetector CT imaging of the chest was performed using the standard protocol during bolus administration of intravenous contrast. Multiplanar CT image reconstructions and MIPs were obtained to evaluate the vascular anatomy. Multidetector CT imaging of the abdomen and pelvis was performed using the standard protocol during bolus administration of intravenous contrast. CONTRAST:  100 cc Isovue 370 IV COMPARISON:  CT chest abdomen pelvis 11/09/2014 FINDINGS: CTA CHEST FINDINGS Cardiovascular: Atherosclerotic calcifications aorta and coronary arteries. Aorta normal caliber without aneurysm or dissection. Pulmonary arteries well opacified and patent. No evidence pulmonary embolism. No pericardial effusion. Mediastinum/Nodes: Air-filled esophagus without gross wall thickening. No thoracic adenopathy. Beam hardening artifacts at base of cervical region from patient's arms. Lungs/Pleura: Subsegmental atelectasis RIGHT upper lobe and in both lower lobes. Central peribronchial thickening. No acute infiltrate, pleural effusion or pneumothorax. Musculoskeletal: Osseous demineralization. Numerous thoracic spine compression fractures. Old fractures posterior LEFT ninth through twelfth ribs. Review of the MIP images confirms the above findings. CT ABDOMEN and PELVIS FINDINGS Hepatobiliary: Stable small cyst lateral segment LEFT lobe liver 13 x 12 mm. Gallbladder well distended with areas of scattered slight gallbladder wall thickening/irregularity unchanged from previous exam. No definite calcified gallstones in gallbladder. No biliary dilatation. Pancreas: Normal appearance Spleen: Normal appearance Adrenals/Urinary Tract: Small BILATERAL renal cysts. Adrenal glands, kidneys and ureters otherwise  normal appearance. Prostatic  enlargement, gland 5.1 x 4.5 x 4.6 cm, containing central calcifications and postsurgical changes of prior TURP. Bladder wall thickening which may reflect chronic outlet obstruction in the setting of prostatic enlargement. Stomach/Bowel: Postsurgical changes of antrectomy and gastrojejunostomy with Roux-en-Y anastamosis. Colon unremarkable. Smaal bowel loo within a small umbilical hernia without obstruction. Additional small bowel loops within an infraumbilical ventral hernia with questionable hyperemia nd thickening of the small bowel wall, unable to exclude enteritis from any etiology including ischemia. No evidence of bowel obstruction or perforation. Vascular/Lymphatic: Atherosclerotic calcifications aorta, aorta up to 2.9 x 2.9 cm diameter. No adenopathy. Reproductive: N/A Other: No free air free fluid. Musculoskeletal: Osseous demineralization with numerous thoracolumbar spine compression fractures and prior vertebroplasties at L1, L2 and L3. Review of the MIP images confirms the above findings. IMPRESSION: Small umbilical hernia containing a nonobstructed small bowel loop. Infraumbilical ventral hernia containing small bowel loops which demonstrate hyperemia and suspected mild wall thickening, unable to exclude enteritis from any etiology including ischemia. No definite evidence of small bowel obstruction or perforation. Prostatic enlargement with mild bladder wall thickening which could represent muscular hypertrophy on the basis of chronic outlet obstruction despite prior TURP, recommend correlation with symptoms. Aortic atherosclerosis and coronary arterial calcification. No evidence of pulmonary embolism. Osteoporosis with numerous vertebral and rib fractures. Electronically Signed   By: Ulyses Southward M.D.   On: 01/25/2016 12:21   Ct Abdomen Pelvis W Contrast  Result Date: 01/25/2016 CLINICAL DATA:  Diffuse abdominal pain, shortness of breath, symptoms for last few days, history hypertension, GERD,  COPD, partial gastrectomy for ulcer disease, BPH, atrial fibrillation EXAM: CT ANGIOGRAPHY CHEST CT ABDOMEN AND PELVIS WITH CONTRAST TECHNIQUE: Multidetector CT imaging of the chest was performed using the standard protocol during bolus administration of intravenous contrast. Multiplanar CT image reconstructions and MIPs were obtained to evaluate the vascular anatomy. Multidetector CT imaging of the abdomen and pelvis was performed using the standard protocol during bolus administration of intravenous contrast. CONTRAST:  100 cc Isovue 370 IV COMPARISON:  CT chest abdomen pelvis 11/09/2014 FINDINGS: CTA CHEST FINDINGS Cardiovascular: Atherosclerotic calcifications aorta and coronary arteries. Aorta normal caliber without aneurysm or dissection. Pulmonary arteries well opacified and patent. No evidence pulmonary embolism. No pericardial effusion. Mediastinum/Nodes: Air-filled esophagus without gross wall thickening. No thoracic adenopathy. Beam hardening artifacts at base of cervical region from patient's arms. Lungs/Pleura: Subsegmental atelectasis RIGHT upper lobe and in both lower lobes. Central peribronchial thickening. No acute infiltrate, pleural effusion or pneumothorax. Musculoskeletal: Osseous demineralization. Numerous thoracic spine compression fractures. Old fractures posterior LEFT ninth through twelfth ribs. Review of the MIP images confirms the above findings. CT ABDOMEN and PELVIS FINDINGS Hepatobiliary: Stable small cyst lateral segment LEFT lobe liver 13 x 12 mm. Gallbladder well distended with areas of scattered slight gallbladder wall thickening/irregularity unchanged from previous exam. No definite calcified gallstones in gallbladder. No biliary dilatation. Pancreas: Normal appearance Spleen: Normal appearance Adrenals/Urinary Tract: Small BILATERAL renal cysts. Adrenal glands, kidneys and ureters otherwise normal appearance. Prostatic enlargement, gland 5.1 x 4.5 x 4.6 cm, containing central  calcifications and postsurgical changes of prior TURP. Bladder wall thickening which may reflect chronic outlet obstruction in the setting of prostatic enlargement. Stomach/Bowel: Postsurgical changes of antrectomy and gastrojejunostomy with Roux-en-Y anastamosis. Colon unremarkable. Smaal bowel loo within a small umbilical hernia without obstruction. Additional small bowel loops within an infraumbilical ventral hernia with questionable hyperemia nd thickening of the small bowel wall, unable to exclude enteritis from any etiology including ischemia.  No evidence of bowel obstruction or perforation. Vascular/Lymphatic: Atherosclerotic calcifications aorta, aorta up to 2.9 x 2.9 cm diameter. No adenopathy. Reproductive: N/A Other: No free air free fluid. Musculoskeletal: Osseous demineralization with numerous thoracolumbar spine compression fractures and prior vertebroplasties at L1, L2 and L3. Review of the MIP images confirms the above findings. IMPRESSION: Small umbilical hernia containing a nonobstructed small bowel loop. Infraumbilical ventral hernia containing small bowel loops which demonstrate hyperemia and suspected mild wall thickening, unable to exclude enteritis from any etiology including ischemia. No definite evidence of small bowel obstruction or perforation. Prostatic enlargement with mild bladder wall thickening which could represent muscular hypertrophy on the basis of chronic outlet obstruction despite prior TURP, recommend correlation with symptoms. Aortic atherosclerosis and coronary arterial calcification. No evidence of pulmonary embolism. Osteoporosis with numerous vertebral and rib fractures. Electronically Signed   By: Ulyses SouthwardMark  Boles M.D.   On: 01/25/2016 12:21   Dg Abd Portable 1v  Result Date: 01/26/2016 CLINICAL DATA:  Incisional hernia EXAM: PORTABLE ABDOMEN - 1 VIEW COMPARISON:  11/24/ 17 FINDINGS: There is normal small bowel gas pattern. Moderate stool noted in right colon. Multilevel  prior vertebroplasty noted lumbar spine. IMPRESSION: Normal small bowel gas pattern. Moderate stool noted in right colon. Electronically Signed   By: Natasha MeadLiviu  Pop M.D.   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  No sign of obstruction or peritonitis.  He continues to be at very high risk for perioperative complications/ death.  According to the ACS Surgical Risk calculator, his risk of complication is over 30%.   Would try to manage him non-operatively for now - will apply abdominal binder.  Start clear liquids   LOS: 2 days    Mallika Sanmiguel K. 01/27/2016

## 2016-01-27 NOTE — Evaluation (Signed)
Physical Therapy Evaluation Patient Details Name: Neil Terry MRN: [id number] DOB: [birth year] Today's Date: 01/27/2016   History of Present Illness  HPI: Neil Terry is a 82 y.o. male with medical history significant of GI bleed, BPH, COPD, GERD, oxygen dependent, abdominal exploratory laparoscopy and abdominal hernias presenting with two-week history of abdominal pain. 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  Clinical Impression  Pt admitted with above diagnosis. Pt currently with functional limitations due to the deficits listed below (see PT Problem List). Walked the hallway today on eval with RW; used supplemental O2 2 L via ; Will consider an O2 qualifying walk next session;  Pt will benefit from skilled PT to increase their independence and safety with mobility to allow discharge to the venue listed below.       Follow Up Recommendations Home health PT    Equipment Recommendations  None recommended by PT    Recommendations for Other Services OT consult     Precautions / Restrictions Precautions Precautions: Fall Required Braces or Orthoses: Other Brace/Splint Other Brace/Splint: Abdominal binder      Mobility  Bed Mobility                  Transfers Overall transfer level: Needs assistance Equipment used: None Transfers: Sit to/from Stand Sit to Stand: Supervision         General transfer comment: Stands from recliner quickly and impulsively; no difficulty noted  Ambulation/Gait Ambulation/Gait assistance: Min guard Ambulation Distance (Feet): 200 Feet Assistive device: Rolling walker (2 wheeled) Gait Pattern/deviations: Step-through pattern;Trunk flexed Gait velocity: Somewhat impulsively fast   General Gait Details: Cues for upright posture and for control, as he tends to walk VERY quickly with RW  Stairs            Wheelchair  Mobility    Modified Rankin (Stroke Patients Only)       Balance Overall balance assessment: Needs assistance           Standing balance-Leahy Scale: Fair                               Pertinent Vitals/Pain Pain Assessment: Faces Faces Pain Scale: Hurts a little bit Pain Location: abdomen Pain Descriptors / Indicators: Aching Pain Intervention(s): Limited activity within patient's tolerance;Monitored during session    Home Living Family/patient expects to be discharged to:: Private residence Living Arrangements: Children (with sons who work) Available Help at Discharge: Family Type of Home: House Home Access: Stairs to enter   Secretary/administratorntrance Stairs-Number of Steps:  (Need to clarify how many steps) Home Layout: One level Home Equipment: Cane - single point;Walker - 2 wheels      Prior Function Level of Independence: Independent with assistive device(s)         Comments: Need to clarify if he has had falls recently     Hand Dominance        Extremity/Trunk Assessment   Upper Extremity Assessment: Defer to OT evaluation           Lower Extremity Assessment: Generalized weakness      Cervical / Trunk Assessment: Other exceptions  Communication   Communication: Prefers language other than [place];Other (comment) (Speaks Arabic; limited English)  Cognition Arousal/Alertness: Awake/alert Behavior During Therapy: WFL for tasks assessed/performed;Impulsive Overall Cognitive Status: No family/caregiver present to determine baseline cognitive functioning  General Comments      Exercises     Assessment/Plan    PT Assessment Patient needs continued PT services  PT Problem List Decreased strength;Decreased activity tolerance;Decreased balance;Decreased mobility;Decreased coordination;Decreased knowledge of use of DME;Decreased safety awareness;Pain          PT Treatment Interventions DME instruction;Gait  training;Stair training;Functional mobility training;Therapeutic activities;Therapeutic exercise;Balance training;Patient/family education;Neuromuscular re-education    PT Goals (Current goals can be found in the Care Plan section)  Acute Rehab PT Goals Patient Stated Goal: did not state PT Goal Formulation: With patient Time For Goal Achievement: 02/10/16 Potential to Achieve Goals: Good    Frequency Min 3X/week   Barriers to discharge Decreased caregiver support It sounds like [name] is alone at home for at least a few hours a day    Co-evaluation               End of Session   Activity Tolerance: Patient tolerated treatment well Patient left: Other (comment);with call bell/phone within reach (in bathroom) Nurse Communication: Mobility status         Time: 1416-1440 PT Time Calculation (min) (ACUTE ONLY): 24 min   Charges:   PT Evaluation $PT Eval Low Complexity: 1 Procedure PT Treatments $Gait Training: 8-22 mins   PT G CodesLevi Aland:        Talicia Sui H Ori Trejos 01/27/2016, 3:08 PM  Van ClinesHolly Akshay Spang, PT  Acute Rehabilitation Services Pager [phone] Office [phone]

## 2016-01-27 NOTE — Progress Notes (Signed)
[facility] PROGRESS NOTE  Neil [name]:[id number] DOB: October 13, 1927 DOA: 01/25/2016  PCP: No PCP Per Patient  Brief History/Interval Summary: 82 year old male from [place] and who does not speak much English with a past medical history of atrial fibrillation on anticoagulation, BPH, COPD, respiratory failure, oxygen dependent, was hospitalized when he was in [place] visiting family. Apparently, he was diagnosed with small bowel obstruction. He improved without any surgical intervention. The symptoms got better. He came back to the [place]. And then started developing pain and so he came into the hospital.  Reason for Visit: Complicated hernia with abdominal pain.  Consultants: Gen. surgery  Procedures: None yet  Antibiotics: Zosyn  Subjective/Interval History: No family at bedside. Patient states that he feels about the same. Has not had any further episodes of nausea or vomiting.   ROS: Denies any headaches  Objective:  Vital Signs  Vitals:   01/26/16 1617 01/26/16 1735 01/26/16 2044 01/27/16 0506  BP:  133/61 138/71 114/66  Pulse:  86 72 65  Resp:  18 18 18   Temp:  98.6 F (37 C) 98 F (36.7 C) 97.5 F (36.4 C)  TempSrc:  Oral Oral Oral  SpO2: 98% 98% 96% 94%  Weight:   74.1 kg (163 lb 5.8 oz)     Intake/Output Summary (Last 24 hours) at 01/27/16 0846 Last data filed at 01/27/16 0740  Gross per 24 hour  Intake             1400 ml  Output             1025 ml  Net              375 ml   Filed Weights   01/26/16 0035 01/26/16 2044  Weight: 73.7 kg (162 lb 6.4 oz) 74.1 kg (163 lb 5.8 oz)    General appearance: alert, cooperative, appears stated age and no distress Resp: Mildly tachypneic. Coarse breath sounds bilaterally. No wheezing or rhonchi. Few crackles at the bases. Cardio: regular rate and rhythm, S1, S2 normal, no murmur, click, rub or gallop GI: Abdomen is soft. Not distended. Nontender today. No masses or organomegaly. Scar from previous  surgery is noted. Extremities: extremities normal, atraumatic, no cyanosis or edema Neurologic: Awake and alert. No focal neurologic deficits.  Lab Results:  Data Reviewed: I have personally reviewed following labs and imaging studies  CBC:  Recent Labs Lab 01/25/16 0952 01/26/16 0533 01/27/16 0742  WBC 7.3 5.6 5.8  HGB 14.7 13.3 13.2  HCT 45.8 42.4 40.5  MCV 82.5 82.0 80.5  PLT 274 245 254    Basic Metabolic Panel:  Recent Labs Lab 01/25/16 0952 01/26/16 0533 01/27/16 0742  NA 134* 134* 135  K 5.0 4.8 4.0  CL 96* 96* 100*  CO2 30 29 28   GLUCOSE 98 144* 154*  BUN 10 11 14   CREATININE 0.94 0.99 0.88  CALCIUM 9.2 8.9 8.6*    GFR: CrCl cannot be calculated (Unknown ideal weight.).  Liver Function Tests:  Recent Labs Lab 01/25/16 0952  AST 23  ALT 16*  ALKPHOS 87  BILITOT 0.5  PROT 7.8  ALBUMIN 3.7     Recent Labs Lab 01/25/16 0952  LIPASE 34   Coagulation Profile:  Recent Labs Lab 01/26/16 0533  INR 1.13    CBG:  Recent Labs Lab 01/26/16 0745 01/26/16 1141 01/26/16 1655 01/26/16 2041 01/27/16 0729  GLUCAP 173* 151* 140* 122* 152*    Radiology Studies: Dg Chest 2 View  Result Date: 01/25/2016 CLINICAL DATA:  Shortness of Breath EXAM: CHEST  2 VIEW COMPARISON:  11/08/2014 FINDINGS: Cardiomegaly. Underline COPD. Linear scarring or atelectasis at the right lung base. No effusions. Multiple moderate mid and lower thoracic spine compression fractures, unchanged since prior CT IMPRESSION: COPD.  Cardiomegaly.  No active disease. Electronically Signed   By: Charlett NoseKevin  Dover M.D.   On: 01/25/2016 10:58   Ct Angio Chest Pe W Or Wo Contrast  Result Date: 01/25/2016 CLINICAL DATA:  Diffuse abdominal pain, shortness of breath, symptoms for last few days, history hypertension, GERD, COPD, partial gastrectomy for ulcer disease, BPH, atrial fibrillation EXAM: CT ANGIOGRAPHY CHEST CT ABDOMEN AND PELVIS WITH CONTRAST TECHNIQUE: Multidetector CT imaging  of the chest was performed using the standard protocol during bolus administration of intravenous contrast. Multiplanar CT image reconstructions and MIPs were obtained to evaluate the vascular anatomy. Multidetector CT imaging of the abdomen and pelvis was performed using the standard protocol during bolus administration of intravenous contrast. CONTRAST:  100 cc Isovue 370 IV COMPARISON:  CT chest abdomen pelvis 11/09/2014 FINDINGS: CTA CHEST FINDINGS Cardiovascular: Atherosclerotic calcifications aorta and coronary arteries. Aorta normal caliber without aneurysm or dissection. Pulmonary arteries well opacified and patent. No evidence pulmonary embolism. No pericardial effusion. Mediastinum/Nodes: Air-filled esophagus without gross wall thickening. No thoracic adenopathy. Beam hardening artifacts at base of cervical region from patient's arms. Lungs/Pleura: Subsegmental atelectasis RIGHT upper lobe and in both lower lobes. Central peribronchial thickening. No acute infiltrate, pleural effusion or pneumothorax. Musculoskeletal: Osseous demineralization. Numerous thoracic spine compression fractures. Old fractures posterior LEFT ninth through twelfth ribs. Review of the MIP images confirms the above findings. CT ABDOMEN and PELVIS FINDINGS Hepatobiliary: Stable small cyst lateral segment LEFT lobe liver 13 x 12 mm. Gallbladder well distended with areas of scattered slight gallbladder wall thickening/irregularity unchanged from previous exam. No definite calcified gallstones in gallbladder. No biliary dilatation. Pancreas: Normal appearance Spleen: Normal appearance Adrenals/Urinary Tract: Small BILATERAL renal cysts. Adrenal glands, kidneys and ureters otherwise normal appearance. Prostatic enlargement, gland 5.1 x 4.5 x 4.6 cm, containing central calcifications and postsurgical changes of prior TURP. Bladder wall thickening which may reflect chronic outlet obstruction in the setting of prostatic enlargement.  Stomach/Bowel: Postsurgical changes of antrectomy and gastrojejunostomy with Roux-en-Y anastamosis. Colon unremarkable. Smaal bowel loo within a small umbilical hernia without obstruction. Additional small bowel loops within an infraumbilical ventral hernia with questionable hyperemia nd thickening of the small bowel wall, unable to exclude enteritis from any etiology including ischemia. No evidence of bowel obstruction or perforation. Vascular/Lymphatic: Atherosclerotic calcifications aorta, aorta up to 2.9 x 2.9 cm diameter. No adenopathy. Reproductive: N/A Other: No free air free fluid. Musculoskeletal: Osseous demineralization with numerous thoracolumbar spine compression fractures and prior vertebroplasties at L1, L2 and L3. Review of the MIP images confirms the above findings. IMPRESSION: Small umbilical hernia containing a nonobstructed small bowel loop. Infraumbilical ventral hernia containing small bowel loops which demonstrate hyperemia and suspected mild wall thickening, unable to exclude enteritis from any etiology including ischemia. No definite evidence of small bowel obstruction or perforation. Prostatic enlargement with mild bladder wall thickening which could represent muscular hypertrophy on the basis of chronic outlet obstruction despite prior TURP, recommend correlation with symptoms. Aortic atherosclerosis and coronary arterial calcification. No evidence of pulmonary embolism. Osteoporosis with numerous vertebral and rib fractures. Electronically Signed   By: Ulyses SouthwardMark  Boles M.D.   On: 01/25/2016 12:21   Ct Abdomen Pelvis W Contrast  Result Date: 01/25/2016 CLINICAL DATA:  Diffuse abdominal  pain, shortness of breath, symptoms for last few days, history hypertension, GERD, COPD, partial gastrectomy for ulcer disease, BPH, atrial fibrillation EXAM: CT ANGIOGRAPHY CHEST CT ABDOMEN AND PELVIS WITH CONTRAST TECHNIQUE: Multidetector CT imaging of the chest was performed using the standard protocol  during bolus administration of intravenous contrast. Multiplanar CT image reconstructions and MIPs were obtained to evaluate the vascular anatomy. Multidetector CT imaging of the abdomen and pelvis was performed using the standard protocol during bolus administration of intravenous contrast. CONTRAST:  100 cc Isovue 370 IV COMPARISON:  CT chest abdomen pelvis 11/09/2014 FINDINGS: CTA CHEST FINDINGS Cardiovascular: Atherosclerotic calcifications aorta and coronary arteries. Aorta normal caliber without aneurysm or dissection. Pulmonary arteries well opacified and patent. No evidence pulmonary embolism. No pericardial effusion. Mediastinum/Nodes: Air-filled esophagus without gross wall thickening. No thoracic adenopathy. Beam hardening artifacts at base of cervical region from patient's arms. Lungs/Pleura: Subsegmental atelectasis RIGHT upper lobe and in both lower lobes. Central peribronchial thickening. No acute infiltrate, pleural effusion or pneumothorax. Musculoskeletal: Osseous demineralization. Numerous thoracic spine compression fractures. Old fractures posterior LEFT ninth through twelfth ribs. Review of the MIP images confirms the above findings. CT ABDOMEN and PELVIS FINDINGS Hepatobiliary: Stable small cyst lateral segment LEFT lobe liver 13 x 12 mm. Gallbladder well distended with areas of scattered slight gallbladder wall thickening/irregularity unchanged from previous exam. No definite calcified gallstones in gallbladder. No biliary dilatation. Pancreas: Normal appearance Spleen: Normal appearance Adrenals/Urinary Tract: Small BILATERAL renal cysts. Adrenal glands, kidneys and ureters otherwise normal appearance. Prostatic enlargement, gland 5.1 x 4.5 x 4.6 cm, containing central calcifications and postsurgical changes of prior TURP. Bladder wall thickening which may reflect chronic outlet obstruction in the setting of prostatic enlargement. Stomach/Bowel: Postsurgical changes of antrectomy and  gastrojejunostomy with Roux-en-Y anastamosis. Colon unremarkable. Smaal bowel loo within a small umbilical hernia without obstruction. Additional small bowel loops within an infraumbilical ventral hernia with questionable hyperemia nd thickening of the small bowel wall, unable to exclude enteritis from any etiology including ischemia. No evidence of bowel obstruction or perforation. Vascular/Lymphatic: Atherosclerotic calcifications aorta, aorta up to 2.9 x 2.9 cm diameter. No adenopathy. Reproductive: N/A Other: No free air free fluid. Musculoskeletal: Osseous demineralization with numerous thoracolumbar spine compression fractures and prior vertebroplasties at L1, L2 and L3. Review of the MIP images confirms the above findings. IMPRESSION: Small umbilical hernia containing a nonobstructed small bowel loop. Infraumbilical ventral hernia containing small bowel loops which demonstrate hyperemia and suspected mild wall thickening, unable to exclude enteritis from any etiology including ischemia. No definite evidence of small bowel obstruction or perforation. Prostatic enlargement with mild bladder wall thickening which could represent muscular hypertrophy on the basis of chronic outlet obstruction despite prior TURP, recommend correlation with symptoms. Aortic atherosclerosis and coronary arterial calcification. No evidence of pulmonary embolism. Osteoporosis with numerous vertebral and rib fractures. Electronically Signed   By: Ulyses SouthwardMark  Boles M.D.   On: 01/25/2016 12:21   Dg Abd Portable 1v  Result Date: 01/26/2016 CLINICAL DATA:  Incisional hernia EXAM: PORTABLE ABDOMEN - 1 VIEW COMPARISON:  11/24/ 17 FINDINGS: There is normal small bowel gas pattern. Moderate stool noted in right colon. Multilevel prior vertebroplasty noted lumbar spine. IMPRESSION: Normal small bowel gas pattern. Moderate stool noted in right colon. Electronically Signed   By: Natasha MeadLiviu  Pop M.D.   On: 01/26/2016 11:09     Medications:    Scheduled: . albuterol  2.5 mg Nebulization BID  . famotidine (PEPCID) IV  20 mg Intravenous Q12H  . mouth rinse  15 mL Mouth Rinse BID  . methylPREDNISolone (SOLU-MEDROL) injection  60 mg Intravenous Q24H  . mometasone-formoterol  2 puff Inhalation BID  . piperacillin-tazobactam (ZOSYN)  IV  3.375 g Intravenous Q8H   Continuous: . dextrose 5 % and 0.45% NaCl 50 mL/hr at 01/25/16 1749   ZHY:[id number], ondansetron **OR** ondansetron (ZOFRAN) IV  Assessment/Plan:  Active Problems:   COPD with acute exacerbation (HCC)   BPH (benign prostatic hyperplasia)   Hernia, abdominal   GERD without esophagitis   PAF (paroxysmal atrial fibrillation) (HCC)   Incisional hernia    Complex incisional hernias Report of the CT scan noted. H/o previous Ex-lap. Wall thickening noted likely from ischemia vs infectious process. Pt has a very high mortality risk if surgery is undertaken given age and poor respiratory status. General surgery is following. Lactic acid normal. Continue Zosyn for now. Abdominal binder ordered by general surgery. No plans for any surgical intervention at this time. Patient pulled his NG out Yesterday. He hasn't had any episodes of vomiting. Okay to leave it out for now. Abdomen appears to have improved. Soft, nondistended today. General surgery to initiate clear liquids.   PAF There was some confusion regarding whether the patient is taking a Apixaban or not. Apparently he was asked to take a lower dose by his providers at [place] due to some bleeding issues. According to his sons his last dose was 2 days ago. Last cardiology note states previous Echo w/ nml LVF and LA size w/ myoview showing low risk (11/2014). Echocardiogram has been ordered. Continue metoprolol. Anticoagulation on hold as of now. No evidence for any bleeding at this time.  COPD with mild acute exacerbation Patient's respiratory status is stable. Patient is on nebulizer treatments. He is on once a day IV  steroids, which will be continued. CT scan of the chest did not show any PE.   GERD IV Pepcid  BPH: Restart flomax when taking po  DVT Prophylaxis: SCDs    Code Status: Full code  Family Communication: Discussed with patient. Discussed with his children yesterday. Disposition Plan: Continue management as outlined above. Mobilize as tolerated. PT/OT evaluation.    LOS: 2 days   [facility]  [facility] Pager [phone] 01/27/2016, 8:46 AM  If 7PM-7AM, please contact night-coverage at [email or web address], password [facility]

## 2016-01-27 NOTE — Progress Notes (Signed)
Heart rhythm Afib with rate 100's-120's.  MD notified.  Will continue to monitor.

## 2016-01-28 ENCOUNTER — Inpatient Hospital Stay (HOSPITAL_COMMUNITY): Payer: Medicare Other

## 2016-01-28 DIAGNOSIS — I4891 Unspecified atrial fibrillation: Secondary | ICD-10-CM

## 2016-01-28 LAB — ECHOCARDIOGRAM COMPLETE: WEIGHTICAEL: 2656.1 [oz_av]

## 2016-01-28 LAB — GLUCOSE, CAPILLARY
GLUCOSE-CAPILLARY: 142 mg/dL — AB (ref 65–99)
GLUCOSE-CAPILLARY: 230 mg/dL — AB (ref 65–99)
Glucose-Capillary: 83 mg/dL (ref 65–99)
Glucose-Capillary: 88 mg/dL (ref 65–99)

## 2016-01-28 MED ORDER — POLYETHYLENE GLYCOL 3350 17 G PO PACK
17.0000 g | PACK | Freq: Every day | ORAL | Status: DC
  Administered 2016-01-28 – 2016-01-30 (×3): 17 g via ORAL
  Filled 2016-01-28 (×3): qty 1

## 2016-01-28 MED ORDER — POLYVINYL ALCOHOL 1.4 % OP SOLN
1.0000 [drp] | OPHTHALMIC | Status: DC | PRN
  Administered 2016-01-28: 1 [drp] via OPHTHALMIC
  Filled 2016-01-28: qty 15

## 2016-01-28 NOTE — Progress Notes (Signed)
[facility] PROGRESS NOTE  Lucienne MinksFarid Epping UJW:[id number] DOB: 1927-04-07 DOA: 01/25/2016  PCP: No PCP Per Patient  Brief History/Interval Summary: 82 year old [place] male from old and who does not speak much English with a past medical history of atrial fibrillation on anticoagulation, BPH, COPD, respiratory failure, oxygen dependent, was hospitalized when he was in [place] visiting family. Apparently, he was diagnosed with small bowel obstruction. He improved without any surgical intervention. The symptoms got better. He came back to the [place]. And then started developing pain and so he came into the hospital.  Reason for Visit: Complicated hernia with abdominal pain.  Consultants: Gen. surgery  Procedures: None yet  Antibiotics: Zosyn  Subjective/Interval History: Patient denies any complaints, reported small bowel movement last night (I speak Arabic). I have told him he needs to get up and walk around with the help of nurses. He uses a cane at home.  ROS: Denies any headaches  Objective:  Vital Signs  Vitals:   01/27/16 1806 01/27/16 2033 01/28/16 0452 01/28/16 0900  BP: 121/68 (!) 152/67 122/66 120/64  Pulse: 78 75 79 80  Resp: 18 18 18 18   Temp: 98 F (36.7 C) 98.7 F (37.1 C) 97.5 F (36.4 C) 98 F (36.7 C)  TempSrc: Oral Oral Oral Oral  SpO2: 97% 99% 98% 95%  Weight:  75.3 kg (166 lb 0.1 oz)      Intake/Output Summary (Last 24 hours) at 01/28/16 1308 Last data filed at 01/28/16 1000  Gross per 24 hour  Intake             2110 ml  Output              300 ml  Net             1810 ml   Filed Weights   01/26/16 0035 01/26/16 2044 01/27/16 2033  Weight: 73.7 kg (162 lb 6.4 oz) 74.1 kg (163 lb 5.8 oz) 75.3 kg (166 lb 0.1 oz)    General appearance: alert, cooperative, appears stated age and no distress Resp: Mildly tachypneic. Coarse breath sounds bilaterally. No wheezing or rhonchi. Few crackles at the bases. Cardio: regular rate and rhythm,  S1, S2 normal, no murmur, click, rub or gallop GI: Abdomen is soft. Not distended. Nontender today. No masses or organomegaly. Scar from previous surgery is noted. Extremities: extremities normal, atraumatic, no cyanosis or edema Neurologic: Awake and alert. No focal neurologic deficits.  Lab Results:  Data Reviewed: I have personally reviewed following labs and imaging studies  CBC:  Recent Labs Lab 01/25/16 0952 01/26/16 0533 01/27/16 0742  WBC 7.3 5.6 5.8  HGB 14.7 13.3 13.2  HCT 45.8 42.4 40.5  MCV 82.5 82.0 80.5  PLT 274 245 254    Basic Metabolic Panel:  Recent Labs Lab 01/25/16 0952 01/26/16 0533 01/27/16 0742  NA 134* 134* 135  K 5.0 4.8 4.0  CL 96* 96* 100*  CO2 30 29 28   GLUCOSE 98 144* 154*  BUN 10 11 14   CREATININE 0.94 0.99 0.88  CALCIUM 9.2 8.9 8.6*    GFR: CrCl cannot be calculated (Unknown ideal weight.).  Liver Function Tests:  Recent Labs Lab 01/25/16 0952  AST 23  ALT 16*  ALKPHOS 87  BILITOT 0.5  PROT 7.8  ALBUMIN 3.7     Recent Labs Lab 01/25/16 0952  LIPASE 34   Coagulation Profile:  Recent Labs Lab 01/26/16 0533  INR 1.13    CBG:  Recent Labs Lab 01/27/16  1203 01/27/16 1657 01/27/16 2031 01/28/16 0742 01/28/16 1146  GLUCAP 127* 137* 86 142* 230*    Radiology Studies: No results found.   Medications:  Scheduled: . albuterol  2.5 mg Nebulization BID  . famotidine (PEPCID) IV  20 mg Intravenous Q12H  . mouth rinse  15 mL Mouth Rinse BID  . methylPREDNISolone (SOLU-MEDROL) injection  60 mg Intravenous Q24H  . metoprolol tartrate  12.5 mg Oral BID  . mometasone-formoterol  2 puff Inhalation BID  . piperacillin-tazobactam (ZOSYN)  IV  3.375 g Intravenous Q8H  . polyethylene glycol  17 g Oral Daily   Continuous: . dextrose 5 % and 0.45% NaCl 50 mL/hr at 01/25/16 1749   AVW:[id number]:albuterol, ondansetron **OR** ondansetron (ZOFRAN) IV, polyvinyl alcohol  Assessment/Plan:  Active Problems:   COPD with acute  exacerbation (HCC)   BPH (benign prostatic hyperplasia)   Hernia, abdominal   GERD without esophagitis   PAF (paroxysmal atrial fibrillation) (HCC)   Incisional hernia    Complex incisional hernias Report of the CT scan noted. H/o previous Ex-lap. Wall thickening noted likely from ischemia vs infectious process. Pt has a very high mortality risk if surgery is undertaken given age and poor respiratory status.  General surgery is following. Lactic acid normal. Continue Zosyn for now. No plans for surgical intervention for now. On clear liquids, advance to full liquids for today, try to ambulate.   PAF There was some confusion regarding whether the patient is taking a Apixaban or not. Apparently he was asked to take a lower dose by his providers at [place] due to some bleeding issues. According to his sons his last dose was 2 days ago. Last cardiology note states previous Echo w/ nml LVF and LA size w/ myoview showing low risk (11/2014). Echocardiogram has been ordered. Continue metoprolol. Anticoagulation on hold as of now. No evidence for any bleeding at this time.  COPD with mild acute exacerbation Patient's respiratory status is stable. Patient is on nebulizer treatments. He is on once a day IV steroids, which will be continued. CT scan of the chest did not show any PE.   GERD IV Pepcid  BPH: Restart flomax when taking po  DVT Prophylaxis: SCDs    Code Status: Full code  Family Communication: Discussed with patient. Discussed with his children yesterday. Disposition Plan: Home with home health service when able to tolerate regular diet.    LOS: 3 days   [facility] CenterELMAHI,Margia Wiesen A  [facility] Pager [phone] 01/28/2016, 1:08 PM  If 7PM-7AM, please contact night-coverage at [email or web address], password [email or web address]

## 2016-01-28 NOTE — Progress Notes (Signed)
SATURATION QUALIFICATIONS: (This note is used to comply with regulatory documentation for home oxygen)  Patient Saturations on Room Air at Rest = 93%  Patient Saturations on Room Air while Ambulating = 92%  Patient Saturations on  Liters of oxygen while Ambulating -- NA  Please briefly explain why patient needs home oxygen:  Pt states he uses oxygen at night.  Showed he did not need supplemental oxygen in a homelike environment during the day. 01/28/2016  Orosi BingKen Odessia Asleson, PT [phone]  (pager)

## 2016-01-28 NOTE — Evaluation (Signed)
Occupational Therapy Evaluation Patient Details Name: Neil Terry MRN: [id number] DOB: 1927-07-29 Today's Date: 01/28/2016    History of Present Illness HPI: Neil Terry is a 82 y.o. male with medical history significant of GI bleed, BPH, COPD, GERD, oxygen dependent, abdominal exploratory laparoscopy and abdominal hernias presenting with two-week history of abdominal pain. 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   Clinical Impression   Pt with decline in function and safety with ADLs and ADL mobility with decreased strength, balance and endurance. Pt fatigues easily, however is very motivated to be independent. Pt would benefit from acute OT services to address impairments to increase level of function and safety    Follow Up Recommendations  No OT follow up;Supervision - Intermittent    Equipment Recommendations  Tub/shower seat;Other (comment) (reacher, grab bars)    Recommendations for Other Services       Precautions / Restrictions Precautions Precautions: Fall Required Braces or Orthoses: Other Brace/Splint Other Brace/Splint: Abdominal binder Restrictions Weight Bearing Restrictions: No      Mobility Bed Mobility Overal bed mobility: Modified Independent                Transfers Overall transfer level: Needs assistance Equipment used: None;Straight cane Transfers: Sit to/from Stand Sit to Stand: Supervision              Balance Overall balance assessment: Needs assistance   Sitting balance-Leahy Scale: Good       Standing balance-Leahy Scale: Fair                              ADL Overall ADL's : Needs assistance/impaired     Grooming: Wash/dry hands;Wash/dry face;Standing;Min guard   Upper Body Bathing: Min guard;Standing   Lower Body Bathing: Min guard;Sit to/from stand   Upper Body Dressing : Min guard;Standing    Lower Body Dressing: Min guard;Sit to/from stand   Toilet Transfer: Supervision/safety;Grab bars;Ambulation;Comfort height toilet   Toileting- Clothing Manipulation and Hygiene: Supervision/safety   Tub/ Shower Transfer: Min guard;Ambulation;Grab bars   Functional mobility during ADLs: Supervision/safety;Cane       Vision Vision Assessment?: No apparent visual deficits              Pertinent Vitals/Pain Pain Assessment: No/denies pain     Hand Dominance Right   Extremity/Trunk Assessment Upper Extremity Assessment Upper Extremity Assessment: Generalized weakness   Lower Extremity Assessment Lower Extremity Assessment: Defer to PT evaluation       Communication Communication Communication: Prefers language other than [place];Other (comment) (Arabic, speaks some [place])   Cognition Arousal/Alertness: Awake/alert Behavior During Therapy: WFL for tasks assessed/performed Overall Cognitive Status: Within Functional Limits for tasks assessed                     General Comments   pt very pleasant and cooperative                 Home Living Family/patient expects to be discharged to:: Private residence Living Arrangements: Children Available Help at Discharge: Family Type of Home: House Home Access: Stairs to enter     Home Layout: One level     Bathroom Shower/Tub: Producer, television/film/videoWalk-in shower   Bathroom Toilet: Standard     Home Equipment: Cane - single point;Walker - 2 wheels          Prior Functioning/Environment Level of Independence: Needs assistance  Gait / Transfers Assistance Needed: uses cane ADL's / Homemaking Assistance Needed: reports thathe was indpendent with ADLS PTA, children do cooking, home mgt            OT Problem List: Decreased activity tolerance;Decreased knowledge of use of DME or AE;Impaired balance (sitting and/or standing);Decreased strength   OT Treatment/Interventions: Self-care/ADL training;DME and/or AE  instruction;Therapeutic activities;Patient/family education;Energy conservation    OT Goals(Current goals can be found in the care plan section) Acute Rehab OT Goals Patient Stated Goal: go home OT Goal Formulation: With patient Time For Goal Achievement: 02/04/16 Potential to Achieve Goals: Good ADL Goals Pt Will Perform Grooming: with supervision;with set-up;standing Pt Will Perform Upper Body Bathing: with set-up;with supervision;standing;sitting Pt Will Perform Lower Body Bathing: with set-up;with supervision;sitting/lateral leans;sit to/from stand;with adaptive equipment Pt Will Perform Upper Body Dressing: with set-up;with supervision;standing;sitting Pt Will Perform Lower Body Dressing: with supervision;with set-up;sitting/lateral leans;with adaptive equipment;sit to/from stand Pt Will Transfer to Toilet: with modified independence;ambulating;grab bars Pt Will Perform Toileting - Clothing Manipulation and hygiene: with modified independence;sit to/from stand Pt Will Perform Tub/Shower Transfer: with supervision;with modified independence;ambulating;shower seat;grab bars Additional ADL Goal #1: [place] conservation education and training with pt/family  OT Frequency: Min 2X/week   Barriers to D/C:    no barriers                     End of Session Equipment Utilized During Treatment: Other (comment);Gait belt;Oxygen (cane)  Activity Tolerance: Patient limited by fatigue Patient left: in bed;with nursing/sitter in room;with call bell/phone within reach (sitting EOB)   Time: [id number] OT Time Calculation (min): 33 min Charges:  OT General Charges $OT Visit: 1 Procedure OT Evaluation $OT Eval Moderate Complexity: 1 Procedure OT Treatments $Self Care/Home Management : 8-22 mins $Therapeutic Activity: 8-22 mins G-Codes:    Galen ManilaSpencer, [name] 01/28/2016, 1:14 PM

## 2016-01-28 NOTE — Progress Notes (Signed)
[facility] Progress Note     Subjective: Currently undergoing TTE. Clear tray at bedside about 50% consumed. Patient denies pain.  No BM recorded in epic  Objective: Vital signs in last 24 hours: Temp:  [97.5 F (36.4 C)-98.7 F (37.1 C)] 97.5 F (36.4 C) (11/27 0452) Pulse Rate:  [75-79] 79 (11/27 0452) Resp:  [18] 18 (11/27 0452) BP: (114-152)/(66-68) 122/66 (11/27 0452) SpO2:  [97 %-99 %] 98 % (11/27 0452) Weight:  [166 lb 0.1 oz (75.3 kg)] 166 lb 0.1 oz (75.3 kg) (11/26 2033) Last BM Date:  (Pt cannot answer)  Intake/Output from previous day: 11/26 0701 - 11/27 0700 In: 2550 [P.O.:1200; I.V.:1200; IV Piggyback:150] Out: 650 [Urine:650] Intake/Output this shift: No intake/output data recorded.  PE: Gen:  Alert, NAD, pleasant HEENT: mild scleral icterus Pulm:  Intermittent wheezing, labored Abd: Soft, ventral hernias reducible without overlying erythema, mild LLQ tenderness to deep palpation, no guarding or peritonitis, ND, +BS  Lab Results:   Recent Labs  01/26/16 0533 01/27/16 0742  WBC 5.6 5.8  HGB 13.3 13.2  HCT 42.4 40.5  PLT 245 254   BMET  Recent Labs  01/26/16 0533 01/27/16 0742  NA 134* 135  K 4.8 4.0  CL 96* 100*  CO2 29 28  GLUCOSE 144* 154*  BUN 11 14  CREATININE 0.99 0.88  CALCIUM 8.9 8.6*   PT/INR  Recent Labs  01/26/16 0533  LABPROT 14.6  INR 1.13   CMP     Component Value Date/Time   NA 135 01/27/2016 0742   K 4.0 01/27/2016 0742   CL 100 (L) 01/27/2016 0742   CO2 28 01/27/2016 0742   GLUCOSE 154 (H) 01/27/2016 0742   BUN 14 01/27/2016 0742   CREATININE 0.88 01/27/2016 0742   CALCIUM 8.6 (L) 01/27/2016 0742   PROT 7.8 01/25/2016 0952   ALBUMIN 3.7 01/25/2016 0952   AST 23 01/25/2016 0952   ALT 16 (L) 01/25/2016 0952   ALKPHOS 87 01/25/2016 0952   BILITOT 0.5 01/25/2016 0952   GFRNONAA >60 01/27/2016 0742   GFRAA >60 01/27/2016 0742   Lipase     Component Value Date/Time   LIPASE 34 01/25/2016  0952   Studies/Results: Dg Abd Portable 1v  Result Date: 01/26/2016 CLINICAL DATA:  Incisional hernia EXAM: PORTABLE ABDOMEN - 1 VIEW COMPARISON:  11/24/ 17 FINDINGS: There is normal small bowel gas pattern. Moderate stool noted in right colon. Multilevel prior vertebroplasty noted lumbar spine. IMPRESSION: Normal small bowel gas pattern. Moderate stool noted in right colon. Electronically Signed   By: Natasha MeadLiviu  Pop M.D.   On: 01/26/2016 11:09    Anti-infectives: Anti-infectives    Start     Dose/Rate Route Frequency Ordered Stop   01/26/16 0000  piperacillin-tazobactam (ZOSYN) IVPB 3.375 g     3.375 g 12.5 mL/hr over 240 Minutes Intravenous Every 8 hours 01/25/16 1654     01/25/16 1700  piperacillin-tazobactam (ZOSYN) IVPB 3.375 g     3.375 g 100 mL/hr over 30 Minutes Intravenous  Once 01/25/16 1654 01/25/16 2001     Assessment/Plan Multiple chronic ventral incisional hernias with chronic small bowel incarceration but no sign of obstruction Severe COPD - home oxygen use; increased work of breathing even at rest Atrial fibrillation - not anticoagulated - abdomen soft with no signs of obstruction - high risk for perioperative complications/death. According to the ACS Surgical Risk calculator, his risk of complication is over 30%.  - continue with non-operative management, continue abdominal binder  Dispo: advance diet to full liquids, daily miralax    LOS: 3 days    Adam PhenixElizabeth S Simaan , [facility], [facility] 01/28/2016, 10:02 AM Pager: [phone] Consults: [phone] Mon-Fri 7:00 am-4:30 pm Sat-Sun 7:00 am-11:30 am   He has no pain today, likes binder, certainly risk of issues with hernia in place but risk of surgery including mortality and long term cardiopulmonary issues outweigh this. Would not offer operation, can advance diet, use binder as comfort. Will sign off

## 2016-01-28 NOTE — Progress Notes (Signed)
Physical Therapy Treatment Patient Details Name: Neil MinksFarid Terry MRN: [id number] DOB: Sep 20, 1927 Today's Date: 01/28/2016    History of Present Illness HPI: Neil Terry is a 82 y.o. male with medical history significant of GI bleed, BPH, COPD, GERD, oxygen dependent, abdominal exploratory laparoscopy and abdominal hernias presenting with two-week history of abdominal pain. 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    PT Comments    Pt progressing steadily with gait stability and speed.  Uses RW well and allows him to cover large distances without drop in sats.   Follow Up Recommendations  Home health PT     Equipment Recommendations  None recommended by PT    Recommendations for Other Services       Precautions / Restrictions Precautions Precautions: Fall Required Braces or Orthoses: Other Brace/Splint Other Brace/Splint: Abdominal binder Restrictions Weight Bearing Restrictions: No    Mobility  Bed Mobility Overal bed mobility: Modified Independent                Transfers Overall transfer level: Needs assistance Equipment used: Rolling walker (2 wheeled) Transfers: Sit to/from Stand Sit to Stand: Supervision            Ambulation/Gait Ambulation/Gait assistance: Supervision Ambulation Distance (Feet): 1000 Feet Assistive device: Rolling walker (2 wheeled) Gait Pattern/deviations: Step-through pattern Gait velocity: fast, but in control at least with RW Gait velocity interpretation: at or above normal speed for age/gender General Gait Details: quick gait. confident and steady   Stairs Stairs: Yes Stairs assistance: Supervision   Number of Stairs: 3 General stair comments: steady with rail  Wheelchair Mobility    Modified Rankin (Stroke Patients Only)       Balance Overall balance assessment: Needs assistance   Sitting balance-Leahy  Scale: Good       Standing balance-Leahy Scale: Good                      Cognition Arousal/Alertness: Awake/alert Behavior During Therapy: WFL for tasks assessed/performed Overall Cognitive Status: Within Functional Limits for tasks assessed                      Exercises      General Comments General comments (skin integrity, edema, etc.): SpO2 on RA at rest 93%  67 bpm.  During gait on RA  92%  83 bpm.  Again at 91% and 78 bpm      Pertinent Vitals/Pain Pain Assessment: Faces Faces Pain Scale: No hurt    Home Living Family/patient expects to be discharged to:: Private residence Living Arrangements: Children Available Help at Discharge: Family Type of Home: House Home Access: Stairs to enter   Home Layout: One level Home Equipment: Cane - single point;Walker - 2 wheels      Prior Function Level of Independence: Needs assistance  Gait / Transfers Assistance Needed: uses cane ADL's / Homemaking Assistance Needed: reports thathe was indpendent with ADLS PTA, children do cooking, home mgt     PT Goals (current goals can now be found in the care plan section) Acute Rehab PT Goals Patient Stated Goal: go home PT Goal Formulation: With patient Time For Goal Achievement: 02/10/16 Potential to Achieve Goals: Good Progress towards PT goals: Progressing toward goals    Frequency    Min 3X/week      PT Plan Current plan remains appropriate    Co-evaluation  End of Session   Activity Tolerance: Patient tolerated treatment well Patient left: with call bell/phone within reach;in bed     Time: 1625-1650 PT Time Calculation (min) (ACUTE ONLY): 25 min  Charges:  $Gait Training: 8-22 mins $Therapeutic Activity: 8-22 mins                    G CodesEliseo Gum:      Avah Bashor V Kasi Lasky 01/28/2016, 4:56 PM 01/28/2016  [place] BingKen Maggie Senseney, PT [phone]  (pager)

## 2016-01-28 NOTE — Progress Notes (Signed)
  Echocardiogram 2D Echocardiogram has been performed.  Neil Terry 01/28/2016, 10:46 AM

## 2016-01-29 LAB — CBC
HEMATOCRIT: 39.7 % (ref 39.0–52.0)
Hemoglobin: 12.5 g/dL — ABNORMAL LOW (ref 13.0–17.0)
MCH: 25.6 pg — ABNORMAL LOW (ref 26.0–34.0)
MCHC: 31.5 g/dL (ref 30.0–36.0)
MCV: 81.2 fL (ref 78.0–100.0)
Platelets: 225 10*3/uL (ref 150–400)
RBC: 4.89 MIL/uL (ref 4.22–5.81)
RDW: 16.3 % — AB (ref 11.5–15.5)
WBC: 8.5 10*3/uL (ref 4.0–10.5)

## 2016-01-29 LAB — BASIC METABOLIC PANEL
ANION GAP: 7 (ref 5–15)
BUN: 13 mg/dL (ref 6–20)
CHLORIDE: 102 mmol/L (ref 101–111)
CO2: 27 mmol/L (ref 22–32)
Calcium: 8.3 mg/dL — ABNORMAL LOW (ref 8.9–10.3)
Creatinine, Ser: 0.8 mg/dL (ref 0.61–1.24)
Glucose, Bld: 145 mg/dL — ABNORMAL HIGH (ref 65–99)
POTASSIUM: 4.2 mmol/L (ref 3.5–5.1)
SODIUM: 136 mmol/L (ref 135–145)

## 2016-01-29 LAB — GLUCOSE, CAPILLARY
GLUCOSE-CAPILLARY: 145 mg/dL — AB (ref 65–99)
GLUCOSE-CAPILLARY: 82 mg/dL (ref 65–99)
GLUCOSE-CAPILLARY: 96 mg/dL (ref 65–99)
Glucose-Capillary: 146 mg/dL — ABNORMAL HIGH (ref 65–99)

## 2016-01-29 LAB — URINE CULTURE: Culture: NO GROWTH

## 2016-01-29 NOTE — Progress Notes (Signed)
Occupational Therapy Treatment Patient Details Name: Neil Terry Jessee MRN: [id number] DOB: 1927/03/08 Today's Date: 01/29/2016    History of present illness HPI: Neil Terry Swab is a 82 y.o. male with medical history significant of GI bleed, BPH, COPD, GERD, oxygen dependent, abdominal exploratory laparoscopy and abdominal hernias presenting with two-week history of abdominal pain. 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   OT comments  Pt making good progress. Able to complete ADL tasks and ambulate @ 150 on RA with O2 Sats 97-99. Pt states "much better". Would like to complete education with pt's family prior to DC.   Follow Up Recommendations  No OT follow up;Supervision - Intermittent    Equipment Recommendations  Tub/shower seat;Other (comment)    Recommendations for Other Services      Precautions / Restrictions Precautions Precautions: Fall       Mobility Bed Mobility Overal bed mobility: Modified Independent                Transfers Overall transfer level: Needs assistance Equipment used: Rolling walker (2 wheeled) Transfers: Sit to/from UGI CorporationStand;Stand Pivot Transfers Sit to Stand: Supervision Stand pivot transfers: Supervision            Balance                                   ADL Overall ADL's : Needs assistance/impaired     Grooming: Set up;Standing   Upper Body Bathing: Set up;Standing   Lower Body Bathing: Supervison/ safety;Set up;Sit to/from stand   Upper Body Dressing : Set up   Lower Body Dressing: Supervision/safety;Set up;Sit to/from stand   Toilet Transfer: Supervision/safety;Ambulation   Toileting- Clothing Manipulation and Hygiene: Modified independent       Functional mobility during ADLs: Supervision/safety;Cane        Vision                     Perception     Praxis      Cognition   Behavior  During Therapy: WFL for tasks assessed/performed Overall Cognitive Status: Within Functional Limits for tasks assessed                       Extremity/Trunk Assessment               Exercises     Shoulder Instructions       General Comments      Pertinent Vitals/ Pain       Pain Assessment: No/denies pain  Home Living                                          Prior Functioning/Environment              Frequency  Min 2X/week        Progress Toward Goals  OT Goals(current goals can now be found in the care plan section)  Progress towards OT goals: Progressing toward goals  Acute Rehab OT Goals Patient Stated Goal: go home OT Goal Formulation: With patient Time For Goal Achievement: 02/04/16 Potential to Achieve Goals: Good ADL Goals Pt Will Perform Grooming: with supervision;with set-up;standing Pt Will Perform Upper Body Bathing: with set-up;with supervision;standing;sitting Pt Will Perform Lower Body Bathing: with set-up;with  supervision;sitting/lateral leans;sit to/from stand;with adaptive equipment Pt Will Perform Upper Body Dressing: with set-up;with supervision;standing;sitting Pt Will Perform Lower Body Dressing: with supervision;with set-up;sitting/lateral leans;with adaptive equipment;sit to/from stand Pt Will Transfer to Toilet: with modified independence;ambulating;grab bars Pt Will Perform Toileting - Clothing Manipulation and hygiene: with modified independence;sit to/from stand Pt Will Perform Tub/Shower Transfer: with supervision;with modified independence;ambulating;shower seat;grab bars Additional ADL Goal #1: [place] conservation education and training with pt/family  Plan Discharge plan remains appropriate    Co-evaluation                 End of Session Equipment Utilized During Treatment: Gait belt;Rolling walker   Activity Tolerance Patient tolerated treatment well   Patient Left in bed;with call  bell/phone within reach;with bed alarm set;with SCD's reapplied   Nurse Communication Mobility status        Time: 1524-1550 OT Time Calculation (min): 26 min  Charges: OT General Charges $OT Visit: 1 Procedure OT Treatments $Self Care/Home Management : 23-37 mins  Gilberta Peeters,HILLARY 01/29/2016, 6:30 PM   [facility] Allisyn Kunz, OTR/L  [phone] 01/29/2016

## 2016-01-29 NOTE — Care Management Important Message (Signed)
Important Message  Patient Details  Name: Neil Terry MRN: [id number] Date of Birth: Oct 13, 1927   Medicare Important Message Given:  Yes    Neil Terry 01/29/2016, 11:50 AM

## 2016-01-29 NOTE — Progress Notes (Signed)
[facility] PROGRESS NOTE  Neil [name]:[id number] DOB: [birth year] DOA: 01/25/2016  PCP: No PCP Per Patient  Brief History/Interval Summary: 82 year old [place] male from old and who does not speak much English with Terry past medical history of atrial fibrillation on anticoagulation, BPH, COPD, respiratory failure, oxygen dependent, was hospitalized when he was in [place] visiting family. Apparently, he was diagnosed with small bowel obstruction. He improved without any surgical intervention. The symptoms got better. [place] And then started developing pain and so he came into the hospital.  Reason for Visit: Complicated hernia with abdominal pain.  Consultants: Gen. surgery  Procedures: None yet  Antibiotics: Zosyn  Subjective/Interval History: Patient reported 2 small bowel movement earlier today. Reported feeling very weak. We'll advance his diet if tolerated very well can be discharged in Terry.m.  ROS: Denies any headaches  Objective:  Vital Signs  Vitals:   01/28/16 2108 01/29/16 0549 01/29/16 0910 01/29/16 0959  BP: 120/77 140/74 (!) 153/87   Pulse: (!) 58 91 62 72  Resp: 18 18 17    Temp: 97.6 F (36.4 C) 97.7 F (36.5 C) 97.8 F (36.6 C)   TempSrc: Oral Oral Oral   SpO2: 98% 96% 97%   Weight: 76.5 kg (168 lb 10.4 oz)       Intake/Output Summary (Last 24 hours) at 01/29/16 1505 Last data filed at 01/29/16 1330  Gross per 24 hour  Intake             2250 ml  Output              577 ml  Net             1673 ml   Filed Weights   01/26/16 2044 01/27/16 2033 01/28/16 2108  Weight: 74.1 kg (163 lb 5.8 oz) 75.3 kg (166 lb 0.1 oz) 76.5 kg (168 lb 10.4 oz)    General appearance: alert, cooperative, appears stated age and no distress Resp: Mildly tachypneic. Coarse breath sounds bilaterally. No wheezing or rhonchi. Few crackles at the bases. Cardio: regular rate and rhythm, S1, S2 normal, no murmur, click, rub or gallop GI:  Abdomen is soft. Not distended. Nontender today. No masses or organomegaly. Scar from previous surgery is noted. Extremities: extremities normal, atraumatic, no cyanosis or edema Neurologic: Awake and alert. No focal neurologic deficits.  Lab Results:  Data Reviewed: I have personally reviewed following labs and imaging studies  CBC:  Recent Labs Lab 01/25/16 0952 01/26/16 0533 01/27/16 0742 01/29/16 0424  WBC 7.3 5.6 5.8 8.5  HGB 14.7 13.3 13.2 12.5*  HCT 45.8 42.4 40.5 39.7  MCV 82.5 82.0 80.5 81.2  PLT 274 245 254 225    Basic Metabolic Panel:  Recent Labs Lab 01/25/16 0952 01/26/16 0533 01/27/16 0742 01/29/16 0424  NA 134* 134* 135 136  K 5.0 4.8 4.0 4.2  CL 96* 96* 100* 102  CO2 30 29 28 27   GLUCOSE 98 144* 154* 145*  BUN 10 11 14 13   CREATININE 0.94 0.99 0.88 0.80  CALCIUM 9.2 8.9 8.6* 8.3*    GFR: CrCl cannot be calculated (Unknown ideal weight.).  Liver Function Tests:  Recent Labs Lab 01/25/16 0952  AST 23  ALT 16*  ALKPHOS 87  BILITOT 0.5  PROT 7.8  ALBUMIN 3.7     Recent Labs Lab 01/25/16 0952  LIPASE 34   Coagulation Profile:  Recent Labs Lab 01/26/16 0533  INR 1.13    CBG:  Recent Labs Lab 01/28/16 1146 01/28/16 1651 01/28/16 2106 01/29/16 0742 01/29/16 1124  GLUCAP 230* 83 88 146* 145*    Radiology Studies: No results found.   Medications:  Scheduled: . famotidine (PEPCID) IV  20 mg Intravenous Q12H  . mouth rinse  15 mL Mouth Rinse BID  . methylPREDNISolone (SOLU-MEDROL) injection  60 mg Intravenous Q24H  . metoprolol tartrate  12.5 mg Oral BID  . mometasone-formoterol  2 puff Inhalation BID  . piperacillin-tazobactam (ZOSYN)  IV  3.375 g Intravenous Q8H  . polyethylene glycol  17 g Oral Daily   Continuous: . dextrose 5 % and 0.45% NaCl 50 mL/hr at 01/29/16 0229   ZOX:[id number]:albuterol, ondansetron **OR** ondansetron (ZOFRAN) IV, polyvinyl alcohol  Assessment/Plan:  Active Problems:   COPD with acute  exacerbation (HCC)   BPH (benign prostatic hyperplasia)   Hernia, abdominal   GERD without esophagitis   PAF (paroxysmal atrial fibrillation) (HCC)   Incisional hernia    Complex incisional hernias Report of the CT scan noted. H/o previous Ex-lap. Wall thickening noted likely from ischemia vs infectious process. Pt has Terry very high mortality risk if surgery is undertaken given age and poor respiratory status.  General surgery is following. Lactic acid normal. Was on Zosyn, discontinued. No plans for surgical intervention for now. Initially on clear liquids advanced to full liquids, will advance to soft diet today, if tolerates can be discharged in Terry.m.  PAF Heart rate is controlled, continue beta blockers. Echo showed LVEF of 60-65% with mild mitral regurgitation. CHA2DS2-VASc is 3 for HTN and 2 points for Age. He is on Eliquis for anticoagulation, per last cardiology note 5 mg twice Terry day  (he should receive regular Eliquis dose as his weight is >60 kg and his Cr is <1.5)   COPD with mild acute exacerbation Patient's respiratory status is stable. Patient is on nebulizer treatments. He is on once Terry day IV steroids, which will be continued. CT scan of the chest did not show any PE.   GERD IV Pepcid  BPH: Restart flomax when taking po  DVT Prophylaxis: SCDs    Code Status: Full code  Family Communication: Discussed with the patient and his son at bedside Disposition Plan: Home with home health service when able to tolerate regular diet, likely in Terry.m.    LOS: 4 days   [facility] LLCELMAHI,Neil Terry  [facility] Pager [phone] 01/29/2016, 3:05 PM  If 7PM-7AM, please contact night-coverage at [email or web address], password [email or web address]

## 2016-01-29 NOTE — Consult Note (Signed)
[facility] CM Primary Care Navigator  01/29/2016  Neil Terry 26-Oct-1927 [id number]  Met with patient, son in-law [name]) and grandson [name]) at the bedside to identify possible discharge needs.  Patient's son-in law reports that patient had nausea, vomiting and increased abdominal pain which led to this admission. Patient speaks Arabic but son in-law speaks [place] (patient agreed for son in-law to interpret for him).  Son in-law reports that patient has a primary care provider in [place], [place] (unable to recall name) where some of his children stay. Patient and son-in law expressed need to have a primary care provider to follow-up with him here in [facility] area since he will be staying with his children here while recovering.  Son in-law endorses [facility] to where patient can be seen for follow-up. Contact information was provided and encouraged to call to establish care with their preferred primary care provider after discharge.    Patient stated using [facility]) to obtain medications without any problem.  He manages his own medications at home straight out of the containers per son in-law.   His family members (whoever is available) provide transportation to his doctors' appointments and his children alternately provides care for him at their home. Son [name]) will be the primary caregiver after discharge per son in-law.  Patient and son-in law voiced understanding to call and schedule an appointment with their chosen primary care provider post discharge for a follow-up.  [facility] care management services offered for care coordination and disease management/ education but was declined. [facility] care management contact information provided for future needs that may arise.   Both denied any other needs or concerns at this time.   For additional questions please contact:  Edwena Felty A. Ezri Landers, BSN, RN-BC [facility]  Navigator Cell: [phone]

## 2016-01-30 DIAGNOSIS — N400 Enlarged prostate without lower urinary tract symptoms: Secondary | ICD-10-CM

## 2016-01-30 DIAGNOSIS — K219 Gastro-esophageal reflux disease without esophagitis: Secondary | ICD-10-CM

## 2016-01-30 DIAGNOSIS — R1013 Epigastric pain: Secondary | ICD-10-CM

## 2016-01-30 DIAGNOSIS — R109 Unspecified abdominal pain: Secondary | ICD-10-CM

## 2016-01-30 LAB — BASIC METABOLIC PANEL
ANION GAP: 8 (ref 5–15)
BUN: 12 mg/dL (ref 6–20)
CALCIUM: 8.4 mg/dL — AB (ref 8.9–10.3)
CHLORIDE: 101 mmol/L (ref 101–111)
CO2: 28 mmol/L (ref 22–32)
CREATININE: 0.79 mg/dL (ref 0.61–1.24)
GFR calc non Af Amer: >60 mL/min (ref >60)
Glucose, Bld: 90 mg/dL (ref 65–99)
Potassium: 3.9 mmol/L (ref 3.5–5.1)
SODIUM: 137 mmol/L (ref 135–145)

## 2016-01-30 LAB — CBC
HCT: 39.7 % (ref 39.0–52.0)
HEMOGLOBIN: 13 g/dL (ref 13.0–17.0)
MCH: 26.4 pg (ref 26.0–34.0)
MCHC: 32.7 g/dL (ref 30.0–36.0)
MCV: 80.5 fL (ref 78.0–100.0)
PLATELETS: 193 10*3/uL (ref 150–400)
RBC: 4.93 MIL/uL (ref 4.22–5.81)
RDW: 16.1 % — ABNORMAL HIGH (ref 11.5–15.5)
WBC: 7.1 10*3/uL (ref 4.0–10.5)

## 2016-01-30 LAB — GLUCOSE, CAPILLARY
GLUCOSE-CAPILLARY: 110 mg/dL — AB (ref 65–99)
Glucose-Capillary: 99 mg/dL (ref 65–99)

## 2016-01-30 MED ORDER — POLYETHYLENE GLYCOL 3350 17 G PO PACK: 17.0000 g | PACK | Freq: Every day | ORAL | 0 refills | Status: AC

## 2016-01-30 NOTE — Discharge Summary (Signed)
Physician Discharge Summary  Neil Terry MRN: [id number] DOB/AGE: [birth year] 82 y.o.  PCP: No PCP Per Patient   Admit date: 01/25/2016 Discharge date: 01/30/2016  Discharge Diagnoses:    Active Problems:   COPD with acute exacerbation (HCC)   BPH (benign prostatic hyperplasia)   Hernia, abdominal   GERD without esophagitis   PAF (paroxysmal atrial fibrillation) (HCC)   Incisional hernia   Abdominal pain    Follow-up recommendations Follow-up with PCP in 3-5 days , including all  additional recommended appointments as below Follow-up CBC, CMP in 3-5 days Continue abdominal binder, Follow up with [place] surgery if needed      Current Discharge Medication List    START taking these medications   Details  polyethylene glycol (MIRALAX / GLYCOLAX) packet Take 17 g by mouth daily. Qty: 14 each, Refills: 0      CONTINUE these medications which have NOT CHANGED   Details  albuterol (PROVENTIL HFA;VENTOLIN HFA) 108 (90 BASE) MCG/ACT inhaler Inhale 2 puffs into the lungs every 6 (six) hours as needed for wheezing or shortness of breath. Qty: 1 Inhaler, Refills: 0    apixaban (ELIQUIS) 5 MG TABS tablet Take 1 tablet (5 mg total) by mouth 2 (two) times daily. Qty: 180 tablet, Refills: 0    budesonide-formoterol (SYMBICORT) 160-4.5 MCG/ACT inhaler Inhale 2 puffs into the lungs 2 (two) times daily. Qty: 10.2 g, Refills: 0    esomeprazole (NEXIUM) 20 MG capsule Take 20 mg by mouth daily at 12 noon.    formoterol (FORADIL) 12 MCG capsule for inhaler Place 12 mcg into inhaler and inhale every 12 (twelve) hours.    metoprolol tartrate (LOPRESSOR) 25 MG tablet Take 0.5 tablets (12.5 mg total) by mouth 2 (two) times daily. Qty: 60 tablet, Refills: 0    tamsulosin (FLOMAX) 0.4 MG CAPS capsule Take 1 capsule (0.4 mg total) by mouth daily. Qty: 30 capsule, Refills: 0    oxyCODONE (OXY IR/ROXICODONE) 5 MG immediate release tablet Take 1 tablet (5 mg total) by mouth  every 4 (four) hours as needed for moderate pain or severe pain. Qty: 20 tablet, Refills: 0    pantoprazole (PROTONIX) 40 MG tablet Take 1 tablet (40 mg total) by mouth daily at 6 (six) AM. Qty: 30 tablet, Refills: 0         Discharge Condition:Stable   Discharge Instructions Get Medicines reviewed and adjusted: Please take all your medications with you for your next visit with your Primary MD  Please request your Primary MD to go over all hospital tests and procedure/radiological results at the follow up, please ask your Primary MD to get all Hospital records sent to his/her office.  If you experience worsening of your admission symptoms, develop shortness of breath, life threatening emergency, suicidal or homicidal thoughts you must seek medical attention immediately by calling 911 or calling your MD immediately if symptoms less severe.  You must read complete instructions/literature along with all the possible adverse reactions/side effects for all the Medicines you take and that have been prescribed to you. Take any new Medicines after you have completely understood and accpet all the possible adverse reactions/side effects.   Do not drive when taking Pain medications.   Do not take more than prescribed Pain, Sleep and Anxiety Medications  Special Instructions: If you have smoked or chewed Tobacco in the last 2 yrs please stop smoking, stop any regular Alcohol and or any Recreational drug use.  Wear Seat belts while driving.  Please note  You were cared for by a hospitalist during your hospital stay. Once you are discharged, your primary care physician will handle any further medical issues. Please note that NO REFILLS for any discharge medications will be authorized once you are discharged, as it is imperative that you return to your primary care physician (or establish a relationship with a primary care physician if you do not have one) for your aftercare needs so that they  can reassess your need for medications and monitor your lab values.     No Known Allergies    Disposition: 06-Home-Health Care Svc   Consults: * General surgery    Significant Diagnostic Studies:  Dg Chest 2 View  Result Date: 01/25/2016 CLINICAL DATA:  Shortness of Breath EXAM: CHEST  2 VIEW COMPARISON:  11/08/2014 FINDINGS: Cardiomegaly. Underline COPD. Linear scarring or atelectasis at the right lung base. No effusions. Multiple moderate mid and lower thoracic spine compression fractures, unchanged since prior CT IMPRESSION: COPD.  Cardiomegaly.  No active disease. Electronically Signed   By: Rolm Baptise M.D.   On: 01/25/2016 10:58   Ct Angio Chest Pe W Or Wo Contrast  Result Date: 01/25/2016 CLINICAL DATA:  Diffuse abdominal pain, shortness of breath, symptoms for last few days, history hypertension, GERD, COPD, partial gastrectomy for ulcer disease, BPH, atrial fibrillation EXAM: CT ANGIOGRAPHY CHEST CT ABDOMEN AND PELVIS WITH CONTRAST TECHNIQUE: Multidetector CT imaging of the chest was performed using the standard protocol during bolus administration of intravenous contrast. Multiplanar CT image reconstructions and MIPs were obtained to evaluate the vascular anatomy. Multidetector CT imaging of the abdomen and pelvis was performed using the standard protocol during bolus administration of intravenous contrast. CONTRAST:  100 cc Isovue 370 IV COMPARISON:  CT chest abdomen pelvis 11/09/2014 FINDINGS: CTA CHEST FINDINGS Cardiovascular: Atherosclerotic calcifications aorta and coronary arteries. Aorta normal caliber without aneurysm or dissection. Pulmonary arteries well opacified and patent. No evidence pulmonary embolism. No pericardial effusion. Mediastinum/Nodes: Air-filled esophagus without gross wall thickening. No thoracic adenopathy. Beam hardening artifacts at base of cervical region from patient's arms. Lungs/Pleura: Subsegmental atelectasis RIGHT upper lobe and in both lower  lobes. Central peribronchial thickening. No acute infiltrate, pleural effusion or pneumothorax. Musculoskeletal: Osseous demineralization. Numerous thoracic spine compression fractures. Old fractures posterior LEFT ninth through twelfth ribs. Review of the MIP images confirms the above findings. CT ABDOMEN and PELVIS FINDINGS Hepatobiliary: Stable small cyst lateral segment LEFT lobe liver 13 x 12 mm. Gallbladder well distended with areas of scattered slight gallbladder wall thickening/irregularity unchanged from previous exam. No definite calcified gallstones in gallbladder. No biliary dilatation. Pancreas: Normal appearance Spleen: Normal appearance Adrenals/Urinary Tract: Small BILATERAL renal cysts. Adrenal glands, kidneys and ureters otherwise normal appearance. Prostatic enlargement, gland 5.1 x 4.5 x 4.6 cm, containing central calcifications and postsurgical changes of prior TURP. Bladder wall thickening which may reflect chronic outlet obstruction in the setting of prostatic enlargement. Stomach/Bowel: Postsurgical changes of antrectomy and gastrojejunostomy with Roux-en-Y anastamosis. Colon unremarkable. Smaal bowel loo within a small umbilical hernia without obstruction. Additional small bowel loops within an infraumbilical ventral hernia with questionable hyperemia nd thickening of the small bowel wall, unable to exclude enteritis from any etiology including ischemia. No evidence of bowel obstruction or perforation. Vascular/Lymphatic: Atherosclerotic calcifications aorta, aorta up to 2.9 x 2.9 cm diameter. No adenopathy. Reproductive: N/A Other: No free air free fluid. Musculoskeletal: Osseous demineralization with numerous thoracolumbar spine compression fractures and prior vertebroplasties at L1, L2 and L3. Review of the MIP images confirms the above  findings. IMPRESSION: Small umbilical hernia containing a nonobstructed small bowel loop. Infraumbilical ventral hernia containing small bowel loops which  demonstrate hyperemia and suspected mild wall thickening, unable to exclude enteritis from any etiology including ischemia. No definite evidence of small bowel obstruction or perforation. Prostatic enlargement with mild bladder wall thickening which could represent muscular hypertrophy on the basis of chronic outlet obstruction despite prior TURP, recommend correlation with symptoms. Aortic atherosclerosis and coronary arterial calcification. No evidence of pulmonary embolism. Osteoporosis with numerous vertebral and rib fractures. Electronically Signed   By: Lavonia Dana M.D.   On: 01/25/2016 12:21   Ct Abdomen Pelvis W Contrast  Result Date: 01/25/2016 CLINICAL DATA:  Diffuse abdominal pain, shortness of breath, symptoms for last few days, history hypertension, GERD, COPD, partial gastrectomy for ulcer disease, BPH, atrial fibrillation EXAM: CT ANGIOGRAPHY CHEST CT ABDOMEN AND PELVIS WITH CONTRAST TECHNIQUE: Multidetector CT imaging of the chest was performed using the standard protocol during bolus administration of intravenous contrast. Multiplanar CT image reconstructions and MIPs were obtained to evaluate the vascular anatomy. Multidetector CT imaging of the abdomen and pelvis was performed using the standard protocol during bolus administration of intravenous contrast. CONTRAST:  100 cc Isovue 370 IV COMPARISON:  CT chest abdomen pelvis 11/09/2014 FINDINGS: CTA CHEST FINDINGS Cardiovascular: Atherosclerotic calcifications aorta and coronary arteries. Aorta normal caliber without aneurysm or dissection. Pulmonary arteries well opacified and patent. No evidence pulmonary embolism. No pericardial effusion. Mediastinum/Nodes: Air-filled esophagus without gross wall thickening. No thoracic adenopathy. Beam hardening artifacts at base of cervical region from patient's arms. Lungs/Pleura: Subsegmental atelectasis RIGHT upper lobe and in both lower lobes. Central peribronchial thickening. No acute infiltrate,  pleural effusion or pneumothorax. Musculoskeletal: Osseous demineralization. Numerous thoracic spine compression fractures. Old fractures posterior LEFT ninth through twelfth ribs. Review of the MIP images confirms the above findings. CT ABDOMEN and PELVIS FINDINGS Hepatobiliary: Stable small cyst lateral segment LEFT lobe liver 13 x 12 mm. Gallbladder well distended with areas of scattered slight gallbladder wall thickening/irregularity unchanged from previous exam. No definite calcified gallstones in gallbladder. No biliary dilatation. Pancreas: Normal appearance Spleen: Normal appearance Adrenals/Urinary Tract: Small BILATERAL renal cysts. Adrenal glands, kidneys and ureters otherwise normal appearance. Prostatic enlargement, gland 5.1 x 4.5 x 4.6 cm, containing central calcifications and postsurgical changes of prior TURP. Bladder wall thickening which may reflect chronic outlet obstruction in the setting of prostatic enlargement. Stomach/Bowel: Postsurgical changes of antrectomy and gastrojejunostomy with Roux-en-Y anastamosis. Colon unremarkable. Smaal bowel loo within a small umbilical hernia without obstruction. Additional small bowel loops within an infraumbilical ventral hernia with questionable hyperemia nd thickening of the small bowel wall, unable to exclude enteritis from any etiology including ischemia. No evidence of bowel obstruction or perforation. Vascular/Lymphatic: Atherosclerotic calcifications aorta, aorta up to 2.9 x 2.9 cm diameter. No adenopathy. Reproductive: N/A Other: No free air free fluid. Musculoskeletal: Osseous demineralization with numerous thoracolumbar spine compression fractures and prior vertebroplasties at L1, L2 and L3. Review of the MIP images confirms the above findings. IMPRESSION: Small umbilical hernia containing a nonobstructed small bowel loop. Infraumbilical ventral hernia containing small bowel loops which demonstrate hyperemia and suspected mild wall thickening,  unable to exclude enteritis from any etiology including ischemia. No definite evidence of small bowel obstruction or perforation. Prostatic enlargement with mild bladder wall thickening which could represent muscular hypertrophy on the basis of chronic outlet obstruction despite prior TURP, recommend correlation with symptoms. Aortic atherosclerosis and coronary arterial calcification. No evidence of pulmonary embolism. Osteoporosis with numerous vertebral  and rib fractures. Electronically Signed   By: Lavonia Dana M.D.   On: 01/25/2016 12:21   Dg Abd Portable 1v  Result Date: 01/26/2016 CLINICAL DATA:  Incisional hernia EXAM: PORTABLE ABDOMEN - 1 VIEW COMPARISON:  11/24/ 17 FINDINGS: There is normal small bowel gas pattern. Moderate stool noted in right colon. Multilevel prior vertebroplasty noted lumbar spine. IMPRESSION: Normal small bowel gas pattern. Moderate stool noted in right colon. Electronically Signed   By: Lahoma Crocker M.D.   On: 01/26/2016 11:09       Filed Weights   01/26/16 2044 01/27/16 2033 01/28/16 2108  Weight: 74.1 kg (163 lb 5.8 oz) 75.3 kg (166 lb 0.1 oz) 76.5 kg (168 lb 10.4 oz)     Microbiology: Recent Results (from the past 240 hour(s))  Culture, Urine     Status: None   Collection Time: 01/28/16  1:57 PM  Result Value Ref Range Status   Specimen Description URINE, RANDOM  Final   Special Requests NONE  Final   Culture NO GROWTH  Final   Report Status 01/29/2016 FINAL  Final       Blood Culture    Component Value Date/Time   SDES URINE, RANDOM 01/28/2016 1357   SPECREQUEST NONE 01/28/2016 1357   CULT NO GROWTH 01/28/2016 1357   REPTSTATUS 01/29/2016 FINAL 01/28/2016 1357      Labs: Results for orders placed or performed during the hospital encounter of 01/25/16 (from the past 48 hour(s))  Glucose, capillary     Status: Abnormal   Collection Time: 01/28/16 11:46 AM  Result Value Ref Range   Glucose-Capillary 230 (H) 65 - 99 mg/dL  Culture, Urine      Status: None   Collection Time: 01/28/16  1:57 PM  Result Value Ref Range   Specimen Description URINE, RANDOM    Special Requests NONE    Culture NO GROWTH    Report Status 01/29/2016 FINAL   Glucose, capillary     Status: None   Collection Time: 01/28/16  4:51 PM  Result Value Ref Range   Glucose-Capillary 83 65 - 99 mg/dL  Glucose, capillary     Status: None   Collection Time: 01/28/16  9:06 PM  Result Value Ref Range   Glucose-Capillary 88 65 - 99 mg/dL  Basic metabolic panel     Status: Abnormal   Collection Time: 01/29/16  4:24 AM  Result Value Ref Range   Sodium 136 135 - 145 mmol/L   Potassium 4.2 3.5 - 5.1 mmol/L   Chloride 102 101 - 111 mmol/L   CO2 27 22 - 32 mmol/L   Glucose, Bld 145 (H) 65 - 99 mg/dL   BUN 13 6 - 20 mg/dL   Creatinine, Ser 0.80 0.61 - 1.24 mg/dL   Calcium 8.3 (L) 8.9 - 10.3 mg/dL   GFR calc non Af Amer >60 >60 mL/min   GFR calc Af Amer >60 >60 mL/min    Comment: (NOTE) The eGFR has been calculated using the CKD EPI equation. This calculation has not been validated in all clinical situations. eGFR's persistently <60 mL/min signify possible Chronic Kidney Disease.    Anion gap 7 5 - 15  CBC     Status: Abnormal   Collection Time: 01/29/16  4:24 AM  Result Value Ref Range   WBC 8.5 4.0 - 10.5 K/uL   RBC 4.89 4.22 - 5.81 MIL/uL   Hemoglobin 12.5 (L) 13.0 - 17.0 g/dL   HCT 39.7 39.0 - 52.0 %  MCV 81.2 78.0 - 100.0 fL   MCH 25.6 (L) 26.0 - 34.0 pg   MCHC 31.5 30.0 - 36.0 g/dL   RDW 16.3 (H) 11.5 - 15.5 %   Platelets 225 150 - 400 K/uL  Glucose, capillary     Status: Abnormal   Collection Time: 01/29/16  7:42 AM  Result Value Ref Range   Glucose-Capillary 146 (H) 65 - 99 mg/dL  Glucose, capillary     Status: Abnormal   Collection Time: 01/29/16 11:24 AM  Result Value Ref Range   Glucose-Capillary 145 (H) 65 - 99 mg/dL  Glucose, capillary     Status: None   Collection Time: 01/29/16  4:16 PM  Result Value Ref Range   Glucose-Capillary  82 65 - 99 mg/dL  Glucose, capillary     Status: None   Collection Time: 01/29/16  9:45 PM  Result Value Ref Range   Glucose-Capillary 96 65 - 99 mg/dL  Basic metabolic panel     Status: Abnormal   Collection Time: 01/30/16  5:50 AM  Result Value Ref Range   Sodium 137 135 - 145 mmol/L   Potassium 3.9 3.5 - 5.1 mmol/L   Chloride 101 101 - 111 mmol/L   CO2 28 22 - 32 mmol/L   Glucose, Bld 90 65 - 99 mg/dL   BUN 12 6 - 20 mg/dL   Creatinine, Ser 0.79 0.61 - 1.24 mg/dL   Calcium 8.4 (L) 8.9 - 10.3 mg/dL   GFR calc non Af Amer >60 >60 mL/min   GFR calc Af Amer >60 >60 mL/min    Comment: (NOTE) The eGFR has been calculated using the CKD EPI equation. This calculation has not been validated in all clinical situations. eGFR's persistently <60 mL/min signify possible Chronic Kidney Disease.    Anion gap 8 5 - 15  CBC     Status: Abnormal   Collection Time: 01/30/16  5:50 AM  Result Value Ref Range   WBC 7.1 4.0 - 10.5 K/uL   RBC 4.93 4.22 - 5.81 MIL/uL   Hemoglobin 13.0 13.0 - 17.0 g/dL   HCT 39.7 39.0 - 52.0 %   MCV 80.5 78.0 - 100.0 fL   MCH 26.4 26.0 - 34.0 pg   MCHC 32.7 30.0 - 36.0 g/dL   RDW 16.1 (H) 11.5 - 15.5 %   Platelets 193 150 - 400 K/uL  Glucose, capillary     Status: None   Collection Time: 01/30/16  7:45 AM  Result Value Ref Range   Glucose-Capillary 99 65 - 99 mg/dL     Lipid Panel     Component Value Date/Time   CHOL 185 11/10/2014 1220   TRIG 78 11/10/2014 1220   HDL 48 11/10/2014 1220   CHOLHDL 3.9 11/10/2014 1220   VLDL 16 11/10/2014 1220   LDLCALC 121 (H) 11/10/2014 1220     Lab Results  Component Value Date   HGBA1C 5.6 11/10/2014        HPI :  82 year old [place] male from old and who does not speak much English with a past medical history of atrial fibrillation on anticoagulation, BPH, COPD, respiratory failure, oxygen dependent, was hospitalized when he was in [place] visiting family. Apparently, he was diagnosed with small bowel  obstruction. He improved without any surgical intervention. The symptoms got better. He came back to the [place]. And then started developing pain and so he came into the hospital.  HOSPITAL COURSE:   Multiple chronic ventral incisional hernias with chronic  small bowel incarceration but no sign of obstruction Report of the CT scan noted as above. H/o previous Ex-lap. Wall thickening noted likely from ischemia vs infectious process. Pt has a very high mortality risk if surgery is undertaken given age and poor respiratory status.  General surgery was consulted. Lactic acid normal. Was on Zosyn, discontinued. No plans for surgical intervention for now, continue abdominal binder Continue soft diet. Which he is tolerating  high risk for perioperative complications/death. According to the ACS Surgical Risk calculator, his risk of complication is over 38%.  - continue with non-operative management, continue abdominal binder  PAF Heart rate is controlled, continue beta blockers. Echo showed LVEF of 60-65% with mild mitral regurgitation. CHA2DS2-VASc is 3 for HTN and 2 points for Age. He is on Eliquis for anticoagulation, per last cardiology note 5 mg twice a day      COPD with mild acute exacerbation Patient's respiratory status is stable. Patient is on nebulizer treatments.   CT scan of the chest did not show any PE.   GERD Resume Protonix  BPH: Resume Flomax   Discharge Exam:   Blood pressure (!) 112/59, pulse 68, temperature 98 F (36.7 C), temperature source Oral, resp. rate 17, weight 76.5 kg (168 lb 10.4 oz), SpO2 97 %.  General appearance: alert, cooperative, appears stated age and no distress Resp: Mildly tachypneic. Coarse breath sounds bilaterally. No wheezing or rhonchi. Few crackles at the bases. Cardio: regular rate and rhythm, S1, S2 normal, no murmur, click, rub or gallop GI: Abdomen is soft. Not distended. Nontender today. No masses or organomegaly. Scar from  previous surgery is noted. Extremities: extremities normal, atraumatic, no cyanosis or edema Neurologic: Awake and alert. No focal neurologic deficits.    Follow-up Information    Primary care provider. Call in 2 day(s).   Why:  Hospital follow-up, follow-up CBC, BMP,          Signed: Armondo Cech 01/30/2016, 10:24 AM        Time spent >45 mins

## 2016-01-30 NOTE — Progress Notes (Signed)
Patient discharged to home with son in law. All discharged instructions reviewed. All followup appt reviewed. Medications reviewed. IVs removed. Telemetry removed. Patients belongings with patient. Patient left unit in stable condition in wheelchair.   Avelina LaineKimberly Zayde Stroupe RN

## 2016-04-09 ENCOUNTER — Encounter: Payer: Self-pay | Admitting: Cardiology

## 2016-04-09 ENCOUNTER — Ambulatory Visit (INDEPENDENT_AMBULATORY_CARE_PROVIDER_SITE_OTHER): Payer: Medicare Other | Admitting: Cardiology

## 2016-04-09 VITALS — BP 115/68 | HR 63 | Ht 60.0 in | Wt 163.6 lb

## 2016-04-09 DIAGNOSIS — Z0000 Encounter for general adult medical examination without abnormal findings: Secondary | ICD-10-CM

## 2016-04-09 DIAGNOSIS — K5669 Other partial intestinal obstruction: Secondary | ICD-10-CM

## 2016-04-09 DIAGNOSIS — R0609 Other forms of dyspnea: Secondary | ICD-10-CM

## 2016-04-09 DIAGNOSIS — R0600 Dyspnea, unspecified: Secondary | ICD-10-CM | POA: Insufficient documentation

## 2016-04-09 DIAGNOSIS — Z7901 Long term (current) use of anticoagulants: Secondary | ICD-10-CM | POA: Diagnosis not present

## 2016-04-09 DIAGNOSIS — K56609 Unspecified intestinal obstruction, unspecified as to partial versus complete obstruction: Secondary | ICD-10-CM | POA: Insufficient documentation

## 2016-04-09 DIAGNOSIS — R0602 Shortness of breath: Secondary | ICD-10-CM | POA: Diagnosis not present

## 2016-04-09 MED ORDER — ALBUTEROL SULFATE HFA 108 (90 BASE) MCG/ACT IN AERS: 2.0000 | INHALATION_SPRAY | Freq: Four times a day (QID) | RESPIRATORY_TRACT | 0 refills | Status: AC | PRN

## 2016-04-09 NOTE — Assessment & Plan Note (Signed)
Pt was hospitalized in Nov 2017 with recurrent bowel obstruction-conservative Rx

## 2016-04-09 NOTE — Patient Instructions (Signed)
Medication Instructions:  RESTART albuterol inhaler 2 puffs every 6 hours as needed for shortness of bretath of wheezing.  Labwork: Have lab work completed today (CBC, BMP, BNP)  Testing/Procedures: NONE  Follow-Up: Your physician recommends that you schedule a follow-up appointment in: 4 weeks with Dr. Rennis GoldenHilty  [facility] have [facility] primary care [facility].      Any Other Special Instructions Will Be Listed Below (If Applicable).     If you need a refill on your cardiac medications before your next appointment, please call your pharmacy.

## 2016-04-09 NOTE — Progress Notes (Signed)
04/09/2016 Neil MinksFarid Terry   1928/01/01  [id number]  Primary Physician No PCP Per Patient Primary Cardiologist: Dr Rennis GoldenHilty  HPI:  83 y/o [place] male who we first saw in 2016 when he was admitted after a fall. The pt developed pneumonia, respiratory failure and cholecystis. He had atrial fibrillation with RVR. He was placed on low dose beta blocker and Eliquis (CHADs VASC=2 for age). We saw him once in follow up. He was back in [place] last year and had a bowel obstruction treated medically. He had recurrent bowel obstruction in Nov and was admitted here. He was again treated conservatively. At discharge he was apparently on his medications medication.  He is in the office today with his grandson who interpreted. The pt has had DOE. At rest in the office he was panting- but if the conversation was directed away from him he stopped. His EKG shows NSR, O2 sat 98% on room air. His grandson didn't know details of his history or medications.    Current Outpatient Prescriptions  Medication Sig Dispense Refill  . esomeprazole (NEXIUM) 20 MG capsule Take 20 mg by mouth daily at 12 noon.    . polyethylene glycol (MIRALAX / GLYCOLAX) packet Take 17 g by mouth daily. 14 each 0  . albuterol (PROVENTIL HFA;VENTOLIN HFA) 108 (90 Base) MCG/ACT inhaler Inhale 2 puffs into the lungs every 6 (six) hours as needed for wheezing or shortness of breath. 1 Inhaler 0   No current facility-administered medications for this visit.     No Known Allergies  Social History   Social History  . Marital status: Widowed    Spouse name: N/A  . Number of children: N/A  . Years of education: N/A   Occupational History  . Not on file.   Social History Main Topics  . Smoking status: Former Smoker    Packs/day: 3.00    Years: 50.00    Types: Cigarettes  . Smokeless tobacco: Never Used     Comment: "quit smoking cigarettes in the 1990's"  . Alcohol use No  . Drug use: No  . Sexual activity: Not on file   Other  Topics Concern  . Not on file   Social History Narrative  . No narrative on file     Review of Systems: General: negative for chills, fever, night sweats or weight changes.  Cardiovascular: negative for chest pain, dyspnea on exertion, edema, orthopnea, palpitations, paroxysmal nocturnal dyspnea or shortness of breath Dermatological: negative for rash Respiratory: negative for cough or wheezing Urologic: negative for hematuria Abdominal: negative for nausea, vomiting, diarrhea, bright red blood per rectum, melena, or hematemesis Neurologic: negative for visual changes, syncope, or dizziness All other systems reviewed and are otherwise negative except as noted above.    Blood pressure 115/68, pulse 63, height 5' (1.524 m), weight 163 lb 9.6 oz (74.2 kg).  General appearance: alert, cooperative, mildly obese and at times tachypneic Neck: no carotid bruit and no JVD Lungs: rhonchi Rt base Heart: regular rate and rhythm Abdomen: obese, multiple surgical scar, abdominal hernia noted-pt wears and abdominal binder Extremities: no edema Skin: Skin color, texture, turgor normal. No rashes or lesions Neurologic: Grossly normal  EKG NSR-HR 68-PAC  ASSESSMENT AND PLAN:   Dyspnea on exertion Pt is here with complaints of DOE x 2 weeks  PAF (paroxysmal atrial fibrillation) (HCC) Picked up while pt was hospitalized after a fall in 2016. Echo normal LVF and LA size, Myoview low risk. CHADS VASc= 2 for age and  HTN  COPD with acute exacerbation (HCC) He is on chronic O2- no records of any pulmonary evaluation. He is off his inhalers  Chronic anticoagulation He is not taking Eliquis- or any of his other medications except Mirilax and Nexium  Bowel obstruction Pt was hospitalized in Nov 2017 with recurrent bowel obstruction-conservative Rx   PLAN  The pt had an echo during his admission in Nov 2017- EF 60-65% with mod LAE. Myoview Sept 2016 was low risk. He has no CHF on exam and his  O2 sat is normal.   I resumed his Proventil inhaler and ordered labs. I did not start a beta blocker secondary to baseline NSR at 68. I do not think he is a candidate for anticoagulation secondary to compliance. I did not start an ASA as he has had GI bleeding in the past (2013). I encouraged the grandson to get a PCP for him. He may need a pulmonary consult at some point. F/U with Dr Buck Mam Edlyn Rosenburg PA-C 04/09/2016 4:16 PM

## 2016-04-09 NOTE — Assessment & Plan Note (Signed)
He is on chronic O2- no records of any pulmonary evaluation. He is off his inhalers

## 2016-04-09 NOTE — Assessment & Plan Note (Signed)
Picked up while pt was hospitalized after a fall in 2016. Echo normal LVF and LA size, Myoview low risk. CHADS VASc= 2 for age and HTN

## 2016-04-09 NOTE — Assessment & Plan Note (Signed)
He is not taking Eliquis- or any of his other medications except Mirilax and Nexium

## 2016-04-09 NOTE — Assessment & Plan Note (Signed)
Pt is here with complaints of DOE x 2 weeks

## 2016-05-08 ENCOUNTER — Ambulatory Visit: Payer: Medicare Other | Admitting: Family

## 2016-06-11 DIAGNOSIS — R1312 Dysphagia, oropharyngeal phase: Secondary | ICD-10-CM | POA: Diagnosis not present

## 2016-06-11 DIAGNOSIS — R279 Unspecified lack of coordination: Secondary | ICD-10-CM | POA: Diagnosis not present

## 2016-06-11 DIAGNOSIS — I480 Paroxysmal atrial fibrillation: Secondary | ICD-10-CM | POA: Diagnosis not present

## 2016-06-11 DIAGNOSIS — M79605 Pain in left leg: Secondary | ICD-10-CM | POA: Diagnosis not present

## 2016-06-11 DIAGNOSIS — J9601 Acute respiratory failure with hypoxia: Secondary | ICD-10-CM | POA: Diagnosis not present

## 2016-06-11 DIAGNOSIS — S199XXA Unspecified injury of neck, initial encounter: Secondary | ICD-10-CM | POA: Diagnosis not present

## 2016-06-11 DIAGNOSIS — R001 Bradycardia, unspecified: Secondary | ICD-10-CM | POA: Diagnosis not present

## 2016-06-11 DIAGNOSIS — Z01811 Encounter for preprocedural respiratory examination: Secondary | ICD-10-CM | POA: Diagnosis not present

## 2016-06-11 DIAGNOSIS — S72142A Displaced intertrochanteric fracture of left femur, initial encounter for closed fracture: Secondary | ICD-10-CM | POA: Diagnosis not present

## 2016-06-11 DIAGNOSIS — R52 Pain, unspecified: Secondary | ICD-10-CM | POA: Diagnosis not present

## 2016-06-11 DIAGNOSIS — I491 Atrial premature depolarization: Secondary | ICD-10-CM | POA: Diagnosis not present

## 2016-06-11 DIAGNOSIS — I272 Pulmonary hypertension, unspecified: Secondary | ICD-10-CM | POA: Diagnosis not present

## 2016-06-11 DIAGNOSIS — R9431 Abnormal electrocardiogram [ECG] [EKG]: Secondary | ICD-10-CM | POA: Diagnosis not present

## 2016-06-11 DIAGNOSIS — J449 Chronic obstructive pulmonary disease, unspecified: Secondary | ICD-10-CM | POA: Diagnosis not present

## 2016-06-11 DIAGNOSIS — Z8249 Family history of ischemic heart disease and other diseases of the circulatory system: Secondary | ICD-10-CM | POA: Diagnosis not present

## 2016-06-11 DIAGNOSIS — I340 Nonrheumatic mitral (valve) insufficiency: Secondary | ICD-10-CM | POA: Diagnosis not present

## 2016-06-11 DIAGNOSIS — I361 Nonrheumatic tricuspid (valve) insufficiency: Secondary | ICD-10-CM | POA: Diagnosis not present

## 2016-06-11 DIAGNOSIS — I270 Primary pulmonary hypertension: Secondary | ICD-10-CM | POA: Diagnosis not present

## 2016-06-11 DIAGNOSIS — Z0289 Encounter for other administrative examinations: Secondary | ICD-10-CM | POA: Diagnosis not present

## 2016-06-11 DIAGNOSIS — Z9981 Dependence on supplemental oxygen: Secondary | ICD-10-CM | POA: Diagnosis not present

## 2016-06-11 DIAGNOSIS — I498 Other specified cardiac arrhythmias: Secondary | ICD-10-CM | POA: Diagnosis not present

## 2016-06-11 DIAGNOSIS — E876 Hypokalemia: Secondary | ICD-10-CM | POA: Diagnosis not present

## 2016-06-11 DIAGNOSIS — E871 Hypo-osmolality and hyponatremia: Secondary | ICD-10-CM | POA: Diagnosis not present

## 2016-06-11 DIAGNOSIS — Z7401 Bed confinement status: Secondary | ICD-10-CM | POA: Diagnosis not present

## 2016-06-11 DIAGNOSIS — J9621 Acute and chronic respiratory failure with hypoxia: Secondary | ICD-10-CM | POA: Diagnosis not present

## 2016-06-11 DIAGNOSIS — Z9181 History of falling: Secondary | ICD-10-CM | POA: Diagnosis not present

## 2016-06-11 DIAGNOSIS — I959 Hypotension, unspecified: Secondary | ICD-10-CM | POA: Diagnosis not present

## 2016-06-11 DIAGNOSIS — Z87891 Personal history of nicotine dependence: Secondary | ICD-10-CM | POA: Diagnosis not present

## 2016-06-11 DIAGNOSIS — N400 Enlarged prostate without lower urinary tract symptoms: Secondary | ICD-10-CM | POA: Diagnosis present

## 2016-06-11 DIAGNOSIS — M25552 Pain in left hip: Secondary | ICD-10-CM | POA: Diagnosis not present

## 2016-06-11 DIAGNOSIS — D62 Acute posthemorrhagic anemia: Secondary | ICD-10-CM | POA: Diagnosis not present

## 2016-06-11 DIAGNOSIS — D72829 Elevated white blood cell count, unspecified: Secondary | ICD-10-CM | POA: Diagnosis present

## 2016-06-11 DIAGNOSIS — R069 Unspecified abnormalities of breathing: Secondary | ICD-10-CM | POA: Diagnosis not present

## 2016-06-11 DIAGNOSIS — Z8781 Personal history of (healed) traumatic fracture: Secondary | ICD-10-CM | POA: Diagnosis not present

## 2016-06-11 DIAGNOSIS — Z823 Family history of stroke: Secondary | ICD-10-CM | POA: Diagnosis not present

## 2016-06-11 DIAGNOSIS — J811 Chronic pulmonary edema: Secondary | ICD-10-CM | POA: Diagnosis not present

## 2016-06-11 DIAGNOSIS — D696 Thrombocytopenia, unspecified: Secondary | ICD-10-CM | POA: Diagnosis present

## 2016-06-11 DIAGNOSIS — J189 Pneumonia, unspecified organism: Secondary | ICD-10-CM | POA: Diagnosis not present

## 2016-06-11 DIAGNOSIS — J9600 Acute respiratory failure, unspecified whether with hypoxia or hypercapnia: Secondary | ICD-10-CM | POA: Diagnosis not present

## 2016-06-11 DIAGNOSIS — E8770 Fluid overload, unspecified: Secondary | ICD-10-CM | POA: Diagnosis not present

## 2016-06-11 DIAGNOSIS — S79912A Unspecified injury of left hip, initial encounter: Secondary | ICD-10-CM | POA: Diagnosis not present

## 2016-06-11 DIAGNOSIS — D649 Anemia, unspecified: Secondary | ICD-10-CM | POA: Diagnosis not present

## 2016-06-11 DIAGNOSIS — M25512 Pain in left shoulder: Secondary | ICD-10-CM | POA: Diagnosis not present

## 2016-06-11 DIAGNOSIS — W19XXXA Unspecified fall, initial encounter: Secondary | ICD-10-CM | POA: Diagnosis not present

## 2016-06-11 DIAGNOSIS — S72142D Displaced intertrochanteric fracture of left femur, subsequent encounter for closed fracture with routine healing: Secondary | ICD-10-CM | POA: Diagnosis not present

## 2016-06-11 DIAGNOSIS — Z7982 Long term (current) use of aspirin: Secondary | ICD-10-CM | POA: Diagnosis not present

## 2016-06-11 DIAGNOSIS — I4891 Unspecified atrial fibrillation: Secondary | ICD-10-CM | POA: Diagnosis not present

## 2016-06-11 DIAGNOSIS — R296 Repeated falls: Secondary | ICD-10-CM | POA: Diagnosis present

## 2016-06-11 DIAGNOSIS — S72012A Unspecified intracapsular fracture of left femur, initial encounter for closed fracture: Secondary | ICD-10-CM | POA: Diagnosis not present

## 2016-06-11 DIAGNOSIS — R000 Tachycardia, unspecified: Secondary | ICD-10-CM | POA: Diagnosis not present

## 2016-06-11 DIAGNOSIS — R1310 Dysphagia, unspecified: Secondary | ICD-10-CM | POA: Diagnosis present

## 2016-06-11 DIAGNOSIS — J690 Pneumonitis due to inhalation of food and vomit: Secondary | ICD-10-CM | POA: Diagnosis not present

## 2016-06-11 DIAGNOSIS — S72002A Fracture of unspecified part of neck of left femur, initial encounter for closed fracture: Secondary | ICD-10-CM | POA: Diagnosis not present

## 2016-06-11 DIAGNOSIS — I472 Ventricular tachycardia: Secondary | ICD-10-CM | POA: Diagnosis present

## 2016-06-11 DIAGNOSIS — Z9049 Acquired absence of other specified parts of digestive tract: Secondary | ICD-10-CM | POA: Diagnosis not present

## 2016-06-19 DIAGNOSIS — Z9181 History of falling: Secondary | ICD-10-CM | POA: Diagnosis not present

## 2016-06-19 DIAGNOSIS — I340 Nonrheumatic mitral (valve) insufficiency: Secondary | ICD-10-CM | POA: Diagnosis not present

## 2016-06-19 DIAGNOSIS — M79652 Pain in left thigh: Secondary | ICD-10-CM | POA: Diagnosis not present

## 2016-06-19 DIAGNOSIS — R0602 Shortness of breath: Secondary | ICD-10-CM | POA: Diagnosis not present

## 2016-06-19 DIAGNOSIS — I270 Primary pulmonary hypertension: Secondary | ICD-10-CM | POA: Diagnosis not present

## 2016-06-19 DIAGNOSIS — I10 Essential (primary) hypertension: Secondary | ICD-10-CM | POA: Diagnosis not present

## 2016-06-19 DIAGNOSIS — M79605 Pain in left leg: Secondary | ICD-10-CM | POA: Diagnosis not present

## 2016-06-19 DIAGNOSIS — S72142D Displaced intertrochanteric fracture of left femur, subsequent encounter for closed fracture with routine healing: Secondary | ICD-10-CM | POA: Diagnosis not present

## 2016-06-19 DIAGNOSIS — M25512 Pain in left shoulder: Secondary | ICD-10-CM | POA: Diagnosis not present

## 2016-06-19 DIAGNOSIS — M7981 Nontraumatic hematoma of soft tissue: Secondary | ICD-10-CM | POA: Diagnosis not present

## 2016-06-19 DIAGNOSIS — J811 Chronic pulmonary edema: Secondary | ICD-10-CM | POA: Diagnosis not present

## 2016-06-19 DIAGNOSIS — J690 Pneumonitis due to inhalation of food and vomit: Secondary | ICD-10-CM | POA: Diagnosis not present

## 2016-06-19 DIAGNOSIS — M25552 Pain in left hip: Secondary | ICD-10-CM | POA: Diagnosis not present

## 2016-06-19 DIAGNOSIS — S72142A Displaced intertrochanteric fracture of left femur, initial encounter for closed fracture: Secondary | ICD-10-CM | POA: Diagnosis not present

## 2016-06-19 DIAGNOSIS — M25562 Pain in left knee: Secondary | ICD-10-CM | POA: Diagnosis not present

## 2016-06-19 DIAGNOSIS — R58 Hemorrhage, not elsewhere classified: Secondary | ICD-10-CM | POA: Diagnosis not present

## 2016-06-19 DIAGNOSIS — E871 Hypo-osmolality and hyponatremia: Secondary | ICD-10-CM | POA: Diagnosis not present

## 2016-06-19 DIAGNOSIS — J9621 Acute and chronic respiratory failure with hypoxia: Secondary | ICD-10-CM | POA: Diagnosis not present

## 2016-06-19 DIAGNOSIS — Z7982 Long term (current) use of aspirin: Secondary | ICD-10-CM | POA: Diagnosis not present

## 2016-06-19 DIAGNOSIS — J449 Chronic obstructive pulmonary disease, unspecified: Secondary | ICD-10-CM | POA: Diagnosis not present

## 2016-06-19 DIAGNOSIS — R1312 Dysphagia, oropharyngeal phase: Secondary | ICD-10-CM | POA: Diagnosis not present

## 2016-06-19 DIAGNOSIS — I361 Nonrheumatic tricuspid (valve) insufficiency: Secondary | ICD-10-CM | POA: Diagnosis not present

## 2016-06-19 DIAGNOSIS — D62 Acute posthemorrhagic anemia: Secondary | ICD-10-CM | POA: Diagnosis not present

## 2016-06-19 DIAGNOSIS — I472 Ventricular tachycardia: Secondary | ICD-10-CM | POA: Diagnosis not present

## 2016-06-19 DIAGNOSIS — Z8781 Personal history of (healed) traumatic fracture: Secondary | ICD-10-CM | POA: Diagnosis not present

## 2016-06-19 DIAGNOSIS — R000 Tachycardia, unspecified: Secondary | ICD-10-CM | POA: Diagnosis not present

## 2016-06-19 DIAGNOSIS — Z9981 Dependence on supplemental oxygen: Secondary | ICD-10-CM | POA: Diagnosis not present

## 2016-06-19 DIAGNOSIS — W19XXXA Unspecified fall, initial encounter: Secondary | ICD-10-CM | POA: Diagnosis not present

## 2016-06-19 DIAGNOSIS — R279 Unspecified lack of coordination: Secondary | ICD-10-CM | POA: Diagnosis not present

## 2016-06-19 DIAGNOSIS — Z7401 Bed confinement status: Secondary | ICD-10-CM | POA: Diagnosis not present

## 2016-06-19 DIAGNOSIS — F418 Other specified anxiety disorders: Secondary | ICD-10-CM | POA: Diagnosis not present

## 2016-06-19 DIAGNOSIS — Z4889 Encounter for other specified surgical aftercare: Secondary | ICD-10-CM | POA: Diagnosis not present

## 2016-06-20 DIAGNOSIS — I10 Essential (primary) hypertension: Secondary | ICD-10-CM | POA: Diagnosis not present

## 2016-06-20 DIAGNOSIS — M25552 Pain in left hip: Secondary | ICD-10-CM | POA: Diagnosis not present

## 2016-06-20 DIAGNOSIS — Z9181 History of falling: Secondary | ICD-10-CM | POA: Diagnosis not present

## 2016-06-20 DIAGNOSIS — Z8781 Personal history of (healed) traumatic fracture: Secondary | ICD-10-CM | POA: Diagnosis not present

## 2016-06-23 DIAGNOSIS — Z8781 Personal history of (healed) traumatic fracture: Secondary | ICD-10-CM | POA: Diagnosis not present

## 2016-06-23 DIAGNOSIS — I10 Essential (primary) hypertension: Secondary | ICD-10-CM | POA: Diagnosis not present

## 2016-06-23 DIAGNOSIS — J449 Chronic obstructive pulmonary disease, unspecified: Secondary | ICD-10-CM | POA: Diagnosis not present

## 2016-06-24 DIAGNOSIS — J449 Chronic obstructive pulmonary disease, unspecified: Secondary | ICD-10-CM | POA: Diagnosis not present

## 2016-06-24 DIAGNOSIS — M25552 Pain in left hip: Secondary | ICD-10-CM | POA: Diagnosis not present

## 2016-06-24 DIAGNOSIS — Z8781 Personal history of (healed) traumatic fracture: Secondary | ICD-10-CM | POA: Diagnosis not present

## 2016-06-24 DIAGNOSIS — M25512 Pain in left shoulder: Secondary | ICD-10-CM | POA: Diagnosis not present

## 2016-06-27 DIAGNOSIS — M25512 Pain in left shoulder: Secondary | ICD-10-CM | POA: Diagnosis not present

## 2016-06-27 DIAGNOSIS — M25552 Pain in left hip: Secondary | ICD-10-CM | POA: Diagnosis not present

## 2016-06-27 DIAGNOSIS — J449 Chronic obstructive pulmonary disease, unspecified: Secondary | ICD-10-CM | POA: Diagnosis not present

## 2016-06-27 DIAGNOSIS — Z4889 Encounter for other specified surgical aftercare: Secondary | ICD-10-CM | POA: Diagnosis not present

## 2016-07-02 DIAGNOSIS — M25552 Pain in left hip: Secondary | ICD-10-CM | POA: Diagnosis not present

## 2016-07-02 DIAGNOSIS — R0602 Shortness of breath: Secondary | ICD-10-CM | POA: Diagnosis not present

## 2016-07-02 DIAGNOSIS — J449 Chronic obstructive pulmonary disease, unspecified: Secondary | ICD-10-CM | POA: Diagnosis not present

## 2016-07-02 DIAGNOSIS — Z8781 Personal history of (healed) traumatic fracture: Secondary | ICD-10-CM | POA: Diagnosis not present

## 2016-07-03 DIAGNOSIS — M25552 Pain in left hip: Secondary | ICD-10-CM | POA: Diagnosis not present

## 2016-07-03 DIAGNOSIS — Z8781 Personal history of (healed) traumatic fracture: Secondary | ICD-10-CM | POA: Diagnosis not present

## 2016-07-03 DIAGNOSIS — R0602 Shortness of breath: Secondary | ICD-10-CM | POA: Diagnosis not present

## 2016-07-03 DIAGNOSIS — J449 Chronic obstructive pulmonary disease, unspecified: Secondary | ICD-10-CM | POA: Diagnosis not present

## 2016-07-08 DIAGNOSIS — Z8781 Personal history of (healed) traumatic fracture: Secondary | ICD-10-CM | POA: Diagnosis not present

## 2016-07-08 DIAGNOSIS — J449 Chronic obstructive pulmonary disease, unspecified: Secondary | ICD-10-CM | POA: Diagnosis not present

## 2016-07-08 DIAGNOSIS — M25552 Pain in left hip: Secondary | ICD-10-CM | POA: Diagnosis not present

## 2016-07-08 DIAGNOSIS — M25512 Pain in left shoulder: Secondary | ICD-10-CM | POA: Diagnosis not present

## 2016-07-14 DIAGNOSIS — F418 Other specified anxiety disorders: Secondary | ICD-10-CM | POA: Diagnosis not present

## 2016-07-14 DIAGNOSIS — M25552 Pain in left hip: Secondary | ICD-10-CM | POA: Diagnosis not present

## 2016-07-14 DIAGNOSIS — J449 Chronic obstructive pulmonary disease, unspecified: Secondary | ICD-10-CM | POA: Diagnosis not present

## 2016-07-17 DIAGNOSIS — Z8781 Personal history of (healed) traumatic fracture: Secondary | ICD-10-CM | POA: Diagnosis not present

## 2016-07-17 DIAGNOSIS — R58 Hemorrhage, not elsewhere classified: Secondary | ICD-10-CM | POA: Diagnosis not present

## 2016-07-22 DIAGNOSIS — M25552 Pain in left hip: Secondary | ICD-10-CM | POA: Diagnosis not present

## 2016-07-22 DIAGNOSIS — J449 Chronic obstructive pulmonary disease, unspecified: Secondary | ICD-10-CM | POA: Diagnosis not present

## 2016-07-22 DIAGNOSIS — M25512 Pain in left shoulder: Secondary | ICD-10-CM | POA: Diagnosis not present

## 2016-07-22 DIAGNOSIS — Z8781 Personal history of (healed) traumatic fracture: Secondary | ICD-10-CM | POA: Diagnosis not present

## 2016-07-30 DIAGNOSIS — K219 Gastro-esophageal reflux disease without esophagitis: Secondary | ICD-10-CM | POA: Diagnosis not present

## 2016-07-30 DIAGNOSIS — J449 Chronic obstructive pulmonary disease, unspecified: Secondary | ICD-10-CM | POA: Diagnosis not present

## 2016-07-30 DIAGNOSIS — I10 Essential (primary) hypertension: Secondary | ICD-10-CM | POA: Diagnosis not present

## 2016-07-30 DIAGNOSIS — Z6827 Body mass index (BMI) 27.0-27.9, adult: Secondary | ICD-10-CM | POA: Diagnosis not present

## 2016-07-30 DIAGNOSIS — Z8781 Personal history of (healed) traumatic fracture: Secondary | ICD-10-CM | POA: Diagnosis not present

## 2016-07-30 DIAGNOSIS — Z79899 Other long term (current) drug therapy: Secondary | ICD-10-CM | POA: Diagnosis not present

## 2016-07-31 DIAGNOSIS — S72012D Unspecified intracapsular fracture of left femur, subsequent encounter for closed fracture with routine healing: Secondary | ICD-10-CM | POA: Diagnosis not present

## 2016-07-31 DIAGNOSIS — F329 Major depressive disorder, single episode, unspecified: Secondary | ICD-10-CM | POA: Diagnosis not present

## 2016-08-01 DIAGNOSIS — S72012D Unspecified intracapsular fracture of left femur, subsequent encounter for closed fracture with routine healing: Secondary | ICD-10-CM | POA: Diagnosis not present

## 2016-08-01 DIAGNOSIS — F329 Major depressive disorder, single episode, unspecified: Secondary | ICD-10-CM | POA: Diagnosis not present

## 2016-08-05 DIAGNOSIS — S72012D Unspecified intracapsular fracture of left femur, subsequent encounter for closed fracture with routine healing: Secondary | ICD-10-CM | POA: Diagnosis not present

## 2016-08-05 DIAGNOSIS — F329 Major depressive disorder, single episode, unspecified: Secondary | ICD-10-CM | POA: Diagnosis not present

## 2016-08-06 DIAGNOSIS — F329 Major depressive disorder, single episode, unspecified: Secondary | ICD-10-CM | POA: Diagnosis not present

## 2016-08-06 DIAGNOSIS — S72012D Unspecified intracapsular fracture of left femur, subsequent encounter for closed fracture with routine healing: Secondary | ICD-10-CM | POA: Diagnosis not present

## 2016-08-07 DIAGNOSIS — S72012D Unspecified intracapsular fracture of left femur, subsequent encounter for closed fracture with routine healing: Secondary | ICD-10-CM | POA: Diagnosis not present

## 2016-08-07 DIAGNOSIS — F329 Major depressive disorder, single episode, unspecified: Secondary | ICD-10-CM | POA: Diagnosis not present

## 2016-08-08 DIAGNOSIS — S72012D Unspecified intracapsular fracture of left femur, subsequent encounter for closed fracture with routine healing: Secondary | ICD-10-CM | POA: Diagnosis not present

## 2016-08-08 DIAGNOSIS — F329 Major depressive disorder, single episode, unspecified: Secondary | ICD-10-CM | POA: Diagnosis not present

## 2016-08-11 DIAGNOSIS — S72012D Unspecified intracapsular fracture of left femur, subsequent encounter for closed fracture with routine healing: Secondary | ICD-10-CM | POA: Diagnosis not present

## 2016-08-11 DIAGNOSIS — F329 Major depressive disorder, single episode, unspecified: Secondary | ICD-10-CM | POA: Diagnosis not present

## 2016-08-12 DIAGNOSIS — I10 Essential (primary) hypertension: Secondary | ICD-10-CM | POA: Diagnosis not present

## 2016-08-12 DIAGNOSIS — Z9981 Dependence on supplemental oxygen: Secondary | ICD-10-CM | POA: Diagnosis not present

## 2016-08-12 DIAGNOSIS — W19XXXD Unspecified fall, subsequent encounter: Secondary | ICD-10-CM | POA: Diagnosis not present

## 2016-08-12 DIAGNOSIS — S72012D Unspecified intracapsular fracture of left femur, subsequent encounter for closed fracture with routine healing: Secondary | ICD-10-CM | POA: Diagnosis not present

## 2016-08-12 DIAGNOSIS — F329 Major depressive disorder, single episode, unspecified: Secondary | ICD-10-CM | POA: Diagnosis not present

## 2016-08-12 DIAGNOSIS — G8929 Other chronic pain: Secondary | ICD-10-CM | POA: Diagnosis not present

## 2016-08-12 DIAGNOSIS — Z87891 Personal history of nicotine dependence: Secondary | ICD-10-CM | POA: Diagnosis not present

## 2016-08-12 DIAGNOSIS — Z9181 History of falling: Secondary | ICD-10-CM | POA: Diagnosis not present

## 2016-08-12 DIAGNOSIS — J449 Chronic obstructive pulmonary disease, unspecified: Secondary | ICD-10-CM | POA: Diagnosis not present

## 2016-08-12 DIAGNOSIS — M1991 Primary osteoarthritis, unspecified site: Secondary | ICD-10-CM | POA: Diagnosis not present

## 2016-08-13 DIAGNOSIS — F329 Major depressive disorder, single episode, unspecified: Secondary | ICD-10-CM | POA: Diagnosis not present

## 2016-08-13 DIAGNOSIS — S72012D Unspecified intracapsular fracture of left femur, subsequent encounter for closed fracture with routine healing: Secondary | ICD-10-CM | POA: Diagnosis not present

## 2016-08-15 DIAGNOSIS — S72012D Unspecified intracapsular fracture of left femur, subsequent encounter for closed fracture with routine healing: Secondary | ICD-10-CM | POA: Diagnosis not present

## 2016-08-15 DIAGNOSIS — F329 Major depressive disorder, single episode, unspecified: Secondary | ICD-10-CM | POA: Diagnosis not present

## 2016-08-18 DIAGNOSIS — I480 Paroxysmal atrial fibrillation: Secondary | ICD-10-CM | POA: Diagnosis not present

## 2016-08-18 DIAGNOSIS — J449 Chronic obstructive pulmonary disease, unspecified: Secondary | ICD-10-CM | POA: Diagnosis not present

## 2016-08-18 DIAGNOSIS — I493 Ventricular premature depolarization: Secondary | ICD-10-CM | POA: Diagnosis not present

## 2016-08-19 DIAGNOSIS — F329 Major depressive disorder, single episode, unspecified: Secondary | ICD-10-CM | POA: Diagnosis not present

## 2016-08-19 DIAGNOSIS — S72012D Unspecified intracapsular fracture of left femur, subsequent encounter for closed fracture with routine healing: Secondary | ICD-10-CM | POA: Diagnosis not present

## 2016-08-20 DIAGNOSIS — F329 Major depressive disorder, single episode, unspecified: Secondary | ICD-10-CM | POA: Diagnosis not present

## 2016-08-20 DIAGNOSIS — S72012D Unspecified intracapsular fracture of left femur, subsequent encounter for closed fracture with routine healing: Secondary | ICD-10-CM | POA: Diagnosis not present

## 2016-08-21 DIAGNOSIS — F329 Major depressive disorder, single episode, unspecified: Secondary | ICD-10-CM | POA: Diagnosis not present

## 2016-08-21 DIAGNOSIS — S72012D Unspecified intracapsular fracture of left femur, subsequent encounter for closed fracture with routine healing: Secondary | ICD-10-CM | POA: Diagnosis not present

## 2016-08-22 DIAGNOSIS — F329 Major depressive disorder, single episode, unspecified: Secondary | ICD-10-CM | POA: Diagnosis not present

## 2016-08-22 DIAGNOSIS — S72012D Unspecified intracapsular fracture of left femur, subsequent encounter for closed fracture with routine healing: Secondary | ICD-10-CM | POA: Diagnosis not present

## 2016-08-27 DIAGNOSIS — Z4889 Encounter for other specified surgical aftercare: Secondary | ICD-10-CM | POA: Diagnosis not present

## 2016-08-27 DIAGNOSIS — M25512 Pain in left shoulder: Secondary | ICD-10-CM | POA: Diagnosis not present

## 2016-08-28 DIAGNOSIS — S72012D Unspecified intracapsular fracture of left femur, subsequent encounter for closed fracture with routine healing: Secondary | ICD-10-CM | POA: Diagnosis not present

## 2016-08-28 DIAGNOSIS — F329 Major depressive disorder, single episode, unspecified: Secondary | ICD-10-CM | POA: Diagnosis not present

## 2016-08-29 DIAGNOSIS — F329 Major depressive disorder, single episode, unspecified: Secondary | ICD-10-CM | POA: Diagnosis not present

## 2016-08-29 DIAGNOSIS — S72012D Unspecified intracapsular fracture of left femur, subsequent encounter for closed fracture with routine healing: Secondary | ICD-10-CM | POA: Diagnosis not present

## 2016-09-03 DIAGNOSIS — N1330 Unspecified hydronephrosis: Secondary | ICD-10-CM | POA: Diagnosis not present

## 2016-09-03 DIAGNOSIS — J189 Pneumonia, unspecified organism: Secondary | ICD-10-CM | POA: Diagnosis not present

## 2016-09-03 DIAGNOSIS — S72392A Other fracture of shaft of left femur, initial encounter for closed fracture: Secondary | ICD-10-CM | POA: Diagnosis present

## 2016-09-03 DIAGNOSIS — Z7401 Bed confinement status: Secondary | ICD-10-CM | POA: Diagnosis not present

## 2016-09-03 DIAGNOSIS — K219 Gastro-esophageal reflux disease without esophagitis: Secondary | ICD-10-CM | POA: Diagnosis present

## 2016-09-03 DIAGNOSIS — E222 Syndrome of inappropriate secretion of antidiuretic hormone: Secondary | ICD-10-CM | POA: Diagnosis present

## 2016-09-03 DIAGNOSIS — J449 Chronic obstructive pulmonary disease, unspecified: Secondary | ICD-10-CM | POA: Diagnosis present

## 2016-09-03 DIAGNOSIS — R296 Repeated falls: Secondary | ICD-10-CM | POA: Diagnosis present

## 2016-09-03 DIAGNOSIS — I10 Essential (primary) hypertension: Secondary | ICD-10-CM | POA: Diagnosis present

## 2016-09-03 DIAGNOSIS — S72302A Unspecified fracture of shaft of left femur, initial encounter for closed fracture: Secondary | ICD-10-CM | POA: Diagnosis not present

## 2016-09-03 DIAGNOSIS — I472 Ventricular tachycardia: Secondary | ICD-10-CM | POA: Diagnosis not present

## 2016-09-03 DIAGNOSIS — E871 Hypo-osmolality and hyponatremia: Secondary | ICD-10-CM | POA: Diagnosis not present

## 2016-09-03 DIAGNOSIS — R1312 Dysphagia, oropharyngeal phase: Secondary | ICD-10-CM | POA: Diagnosis not present

## 2016-09-03 DIAGNOSIS — Z9181 History of falling: Secondary | ICD-10-CM | POA: Diagnosis not present

## 2016-09-03 DIAGNOSIS — Z7982 Long term (current) use of aspirin: Secondary | ICD-10-CM | POA: Diagnosis not present

## 2016-09-03 DIAGNOSIS — E669 Obesity, unspecified: Secondary | ICD-10-CM | POA: Diagnosis present

## 2016-09-03 DIAGNOSIS — Z6823 Body mass index (BMI) 23.0-23.9, adult: Secondary | ICD-10-CM | POA: Diagnosis not present

## 2016-09-03 DIAGNOSIS — Z9981 Dependence on supplemental oxygen: Secondary | ICD-10-CM | POA: Diagnosis not present

## 2016-09-03 DIAGNOSIS — R531 Weakness: Secondary | ICD-10-CM | POA: Diagnosis not present

## 2016-09-03 DIAGNOSIS — S79912A Unspecified injury of left hip, initial encounter: Secondary | ICD-10-CM | POA: Diagnosis not present

## 2016-09-03 DIAGNOSIS — Z823 Family history of stroke: Secondary | ICD-10-CM | POA: Diagnosis not present

## 2016-09-03 DIAGNOSIS — R05 Cough: Secondary | ICD-10-CM | POA: Diagnosis not present

## 2016-09-03 DIAGNOSIS — R279 Unspecified lack of coordination: Secondary | ICD-10-CM | POA: Diagnosis not present

## 2016-09-03 DIAGNOSIS — S7292XA Unspecified fracture of left femur, initial encounter for closed fracture: Secondary | ICD-10-CM | POA: Diagnosis not present

## 2016-09-03 DIAGNOSIS — R1310 Dysphagia, unspecified: Secondary | ICD-10-CM | POA: Diagnosis not present

## 2016-09-03 DIAGNOSIS — E039 Hypothyroidism, unspecified: Secondary | ICD-10-CM | POA: Diagnosis present

## 2016-09-03 DIAGNOSIS — M25552 Pain in left hip: Secondary | ICD-10-CM | POA: Diagnosis not present

## 2016-09-03 DIAGNOSIS — S72142D Displaced intertrochanteric fracture of left femur, subsequent encounter for closed fracture with routine healing: Secondary | ICD-10-CM | POA: Diagnosis not present

## 2016-09-03 DIAGNOSIS — R069 Unspecified abnormalities of breathing: Secondary | ICD-10-CM | POA: Diagnosis not present

## 2016-09-03 DIAGNOSIS — S72342A Displaced spiral fracture of shaft of left femur, initial encounter for closed fracture: Secondary | ICD-10-CM | POA: Diagnosis not present

## 2016-09-03 DIAGNOSIS — Z87891 Personal history of nicotine dependence: Secondary | ICD-10-CM | POA: Diagnosis not present

## 2016-09-03 DIAGNOSIS — R0602 Shortness of breath: Secondary | ICD-10-CM | POA: Diagnosis not present

## 2016-09-03 DIAGNOSIS — E2740 Unspecified adrenocortical insufficiency: Secondary | ICD-10-CM | POA: Diagnosis not present

## 2016-09-03 DIAGNOSIS — N400 Enlarged prostate without lower urinary tract symptoms: Secondary | ICD-10-CM | POA: Diagnosis present

## 2016-09-03 DIAGNOSIS — Z96642 Presence of left artificial hip joint: Secondary | ICD-10-CM | POA: Diagnosis present

## 2016-09-03 DIAGNOSIS — R52 Pain, unspecified: Secondary | ICD-10-CM | POA: Diagnosis not present

## 2016-09-03 DIAGNOSIS — J811 Chronic pulmonary edema: Secondary | ICD-10-CM | POA: Diagnosis not present

## 2016-09-11 DIAGNOSIS — J449 Chronic obstructive pulmonary disease, unspecified: Secondary | ICD-10-CM | POA: Diagnosis not present

## 2016-09-11 DIAGNOSIS — R279 Unspecified lack of coordination: Secondary | ICD-10-CM | POA: Diagnosis not present

## 2016-09-11 DIAGNOSIS — E871 Hypo-osmolality and hyponatremia: Secondary | ICD-10-CM | POA: Diagnosis not present

## 2016-09-11 DIAGNOSIS — Z7401 Bed confinement status: Secondary | ICD-10-CM | POA: Diagnosis not present

## 2016-09-11 DIAGNOSIS — I472 Ventricular tachycardia: Secondary | ICD-10-CM | POA: Diagnosis not present

## 2016-09-11 DIAGNOSIS — R0989 Other specified symptoms and signs involving the circulatory and respiratory systems: Secondary | ICD-10-CM | POA: Diagnosis not present

## 2016-09-11 DIAGNOSIS — Z8781 Personal history of (healed) traumatic fracture: Secondary | ICD-10-CM | POA: Diagnosis not present

## 2016-09-11 DIAGNOSIS — G4700 Insomnia, unspecified: Secondary | ICD-10-CM | POA: Diagnosis not present

## 2016-09-11 DIAGNOSIS — R1312 Dysphagia, oropharyngeal phase: Secondary | ICD-10-CM | POA: Diagnosis not present

## 2016-09-11 DIAGNOSIS — F419 Anxiety disorder, unspecified: Secondary | ICD-10-CM | POA: Diagnosis not present

## 2016-09-11 DIAGNOSIS — G2581 Restless legs syndrome: Secondary | ICD-10-CM | POA: Diagnosis not present

## 2016-09-11 DIAGNOSIS — J189 Pneumonia, unspecified organism: Secondary | ICD-10-CM | POA: Diagnosis not present

## 2016-09-11 DIAGNOSIS — Z9889 Other specified postprocedural states: Secondary | ICD-10-CM | POA: Diagnosis not present

## 2016-09-11 DIAGNOSIS — S72342A Displaced spiral fracture of shaft of left femur, initial encounter for closed fracture: Secondary | ICD-10-CM | POA: Diagnosis not present

## 2016-09-11 DIAGNOSIS — Z9981 Dependence on supplemental oxygen: Secondary | ICD-10-CM | POA: Diagnosis not present

## 2016-09-11 DIAGNOSIS — I10 Essential (primary) hypertension: Secondary | ICD-10-CM | POA: Diagnosis not present

## 2016-09-11 DIAGNOSIS — K5903 Drug induced constipation: Secondary | ICD-10-CM | POA: Diagnosis not present

## 2016-09-11 DIAGNOSIS — M79605 Pain in left leg: Secondary | ICD-10-CM | POA: Diagnosis not present

## 2016-09-11 DIAGNOSIS — R0981 Nasal congestion: Secondary | ICD-10-CM | POA: Diagnosis not present

## 2016-09-11 DIAGNOSIS — R410 Disorientation, unspecified: Secondary | ICD-10-CM | POA: Diagnosis not present

## 2016-09-11 DIAGNOSIS — S72142D Displaced intertrochanteric fracture of left femur, subsequent encounter for closed fracture with routine healing: Secondary | ICD-10-CM | POA: Diagnosis not present

## 2016-09-11 DIAGNOSIS — J811 Chronic pulmonary edema: Secondary | ICD-10-CM | POA: Diagnosis not present

## 2016-09-11 DIAGNOSIS — M25552 Pain in left hip: Secondary | ICD-10-CM | POA: Diagnosis not present

## 2016-09-11 DIAGNOSIS — R339 Retention of urine, unspecified: Secondary | ICD-10-CM | POA: Diagnosis not present

## 2016-09-11 DIAGNOSIS — R531 Weakness: Secondary | ICD-10-CM | POA: Diagnosis not present

## 2016-09-11 DIAGNOSIS — S72302A Unspecified fracture of shaft of left femur, initial encounter for closed fracture: Secondary | ICD-10-CM | POA: Diagnosis not present

## 2016-09-12 DIAGNOSIS — S72302A Unspecified fracture of shaft of left femur, initial encounter for closed fracture: Secondary | ICD-10-CM | POA: Diagnosis not present

## 2016-09-12 DIAGNOSIS — Z9889 Other specified postprocedural states: Secondary | ICD-10-CM | POA: Diagnosis not present

## 2016-09-12 DIAGNOSIS — K5903 Drug induced constipation: Secondary | ICD-10-CM | POA: Diagnosis not present

## 2016-09-12 DIAGNOSIS — R410 Disorientation, unspecified: Secondary | ICD-10-CM | POA: Diagnosis not present

## 2016-09-12 DIAGNOSIS — M79605 Pain in left leg: Secondary | ICD-10-CM | POA: Diagnosis not present

## 2016-09-15 DIAGNOSIS — J449 Chronic obstructive pulmonary disease, unspecified: Secondary | ICD-10-CM | POA: Diagnosis not present

## 2016-09-15 DIAGNOSIS — R339 Retention of urine, unspecified: Secondary | ICD-10-CM | POA: Diagnosis not present

## 2016-09-15 DIAGNOSIS — I10 Essential (primary) hypertension: Secondary | ICD-10-CM | POA: Diagnosis not present

## 2016-09-15 DIAGNOSIS — Z8781 Personal history of (healed) traumatic fracture: Secondary | ICD-10-CM | POA: Diagnosis not present

## 2016-09-17 DIAGNOSIS — J449 Chronic obstructive pulmonary disease, unspecified: Secondary | ICD-10-CM | POA: Diagnosis not present

## 2016-09-17 DIAGNOSIS — Z8781 Personal history of (healed) traumatic fracture: Secondary | ICD-10-CM | POA: Diagnosis not present

## 2016-09-17 DIAGNOSIS — J189 Pneumonia, unspecified organism: Secondary | ICD-10-CM | POA: Diagnosis not present

## 2016-09-17 DIAGNOSIS — M25552 Pain in left hip: Secondary | ICD-10-CM | POA: Diagnosis not present

## 2016-09-19 DIAGNOSIS — M25552 Pain in left hip: Secondary | ICD-10-CM | POA: Diagnosis not present

## 2016-09-19 DIAGNOSIS — S72342A Displaced spiral fracture of shaft of left femur, initial encounter for closed fracture: Secondary | ICD-10-CM | POA: Diagnosis not present

## 2016-09-23 DIAGNOSIS — F419 Anxiety disorder, unspecified: Secondary | ICD-10-CM | POA: Diagnosis not present

## 2016-09-23 DIAGNOSIS — M25552 Pain in left hip: Secondary | ICD-10-CM | POA: Diagnosis not present

## 2016-09-23 DIAGNOSIS — J449 Chronic obstructive pulmonary disease, unspecified: Secondary | ICD-10-CM | POA: Diagnosis not present

## 2016-09-23 DIAGNOSIS — I10 Essential (primary) hypertension: Secondary | ICD-10-CM | POA: Diagnosis not present

## 2016-09-30 DIAGNOSIS — I10 Essential (primary) hypertension: Secondary | ICD-10-CM | POA: Diagnosis not present

## 2016-09-30 DIAGNOSIS — F419 Anxiety disorder, unspecified: Secondary | ICD-10-CM | POA: Diagnosis not present

## 2016-09-30 DIAGNOSIS — J449 Chronic obstructive pulmonary disease, unspecified: Secondary | ICD-10-CM | POA: Diagnosis not present

## 2016-09-30 DIAGNOSIS — M79605 Pain in left leg: Secondary | ICD-10-CM | POA: Diagnosis not present

## 2016-10-07 DIAGNOSIS — Z9889 Other specified postprocedural states: Secondary | ICD-10-CM | POA: Diagnosis not present

## 2016-10-07 DIAGNOSIS — G4700 Insomnia, unspecified: Secondary | ICD-10-CM | POA: Diagnosis not present

## 2016-10-07 DIAGNOSIS — S72302A Unspecified fracture of shaft of left femur, initial encounter for closed fracture: Secondary | ICD-10-CM | POA: Diagnosis not present

## 2016-10-07 DIAGNOSIS — G2581 Restless legs syndrome: Secondary | ICD-10-CM | POA: Diagnosis not present

## 2016-10-08 DIAGNOSIS — M25552 Pain in left hip: Secondary | ICD-10-CM | POA: Diagnosis not present

## 2016-10-08 DIAGNOSIS — G4700 Insomnia, unspecified: Secondary | ICD-10-CM | POA: Diagnosis not present

## 2016-10-08 DIAGNOSIS — R0981 Nasal congestion: Secondary | ICD-10-CM | POA: Diagnosis not present

## 2016-10-08 DIAGNOSIS — G2581 Restless legs syndrome: Secondary | ICD-10-CM | POA: Diagnosis not present

## 2016-10-10 DIAGNOSIS — S72343D Displaced spiral fracture of shaft of unspecified femur, subsequent encounter for closed fracture with routine healing: Secondary | ICD-10-CM | POA: Diagnosis not present

## 2016-10-10 DIAGNOSIS — I10 Essential (primary) hypertension: Secondary | ICD-10-CM | POA: Diagnosis not present

## 2016-10-13 DIAGNOSIS — I10 Essential (primary) hypertension: Secondary | ICD-10-CM | POA: Diagnosis not present

## 2016-10-13 DIAGNOSIS — S72343D Displaced spiral fracture of shaft of unspecified femur, subsequent encounter for closed fracture with routine healing: Secondary | ICD-10-CM | POA: Diagnosis not present

## 2016-10-14 DIAGNOSIS — S72343D Displaced spiral fracture of shaft of unspecified femur, subsequent encounter for closed fracture with routine healing: Secondary | ICD-10-CM | POA: Diagnosis not present

## 2016-10-14 DIAGNOSIS — I10 Essential (primary) hypertension: Secondary | ICD-10-CM | POA: Diagnosis not present

## 2016-10-15 DIAGNOSIS — S72345D Nondisplaced spiral fracture of shaft of left femur, subsequent encounter for closed fracture with routine healing: Secondary | ICD-10-CM | POA: Diagnosis not present

## 2016-10-15 DIAGNOSIS — I10 Essential (primary) hypertension: Secondary | ICD-10-CM | POA: Diagnosis not present

## 2016-10-15 DIAGNOSIS — S72343D Displaced spiral fracture of shaft of unspecified femur, subsequent encounter for closed fracture with routine healing: Secondary | ICD-10-CM | POA: Diagnosis not present

## 2016-10-15 DIAGNOSIS — J449 Chronic obstructive pulmonary disease, unspecified: Secondary | ICD-10-CM | POA: Diagnosis not present

## 2016-10-16 DIAGNOSIS — I10 Essential (primary) hypertension: Secondary | ICD-10-CM | POA: Diagnosis not present

## 2016-10-16 DIAGNOSIS — S72343D Displaced spiral fracture of shaft of unspecified femur, subsequent encounter for closed fracture with routine healing: Secondary | ICD-10-CM | POA: Diagnosis not present

## 2016-10-17 DIAGNOSIS — S72343D Displaced spiral fracture of shaft of unspecified femur, subsequent encounter for closed fracture with routine healing: Secondary | ICD-10-CM | POA: Diagnosis not present

## 2016-10-17 DIAGNOSIS — I10 Essential (primary) hypertension: Secondary | ICD-10-CM | POA: Diagnosis not present

## 2016-10-20 DIAGNOSIS — S72343D Displaced spiral fracture of shaft of unspecified femur, subsequent encounter for closed fracture with routine healing: Secondary | ICD-10-CM | POA: Diagnosis not present

## 2016-10-20 DIAGNOSIS — I10 Essential (primary) hypertension: Secondary | ICD-10-CM | POA: Diagnosis not present

## 2016-10-21 DIAGNOSIS — I10 Essential (primary) hypertension: Secondary | ICD-10-CM | POA: Diagnosis not present

## 2016-10-21 DIAGNOSIS — J441 Chronic obstructive pulmonary disease with (acute) exacerbation: Secondary | ICD-10-CM | POA: Diagnosis not present

## 2016-10-21 DIAGNOSIS — S322XXA Fracture of coccyx, initial encounter for closed fracture: Secondary | ICD-10-CM | POA: Diagnosis not present

## 2016-10-21 DIAGNOSIS — S34139A Unspecified injury to sacral spinal cord, initial encounter: Secondary | ICD-10-CM | POA: Diagnosis not present

## 2016-10-21 DIAGNOSIS — E871 Hypo-osmolality and hyponatremia: Secondary | ICD-10-CM | POA: Diagnosis not present

## 2016-10-21 DIAGNOSIS — R0902 Hypoxemia: Secondary | ICD-10-CM | POA: Diagnosis not present

## 2016-10-21 DIAGNOSIS — S0990XA Unspecified injury of head, initial encounter: Secondary | ICD-10-CM | POA: Diagnosis not present

## 2016-10-21 DIAGNOSIS — J189 Pneumonia, unspecified organism: Secondary | ICD-10-CM | POA: Diagnosis not present

## 2016-10-21 DIAGNOSIS — S72343D Displaced spiral fracture of shaft of unspecified femur, subsequent encounter for closed fracture with routine healing: Secondary | ICD-10-CM | POA: Diagnosis not present

## 2016-10-21 DIAGNOSIS — M4854XA Collapsed vertebra, not elsewhere classified, thoracic region, initial encounter for fracture: Secondary | ICD-10-CM | POA: Diagnosis not present

## 2016-10-21 DIAGNOSIS — J9601 Acute respiratory failure with hypoxia: Secondary | ICD-10-CM | POA: Diagnosis not present

## 2016-10-21 DIAGNOSIS — J440 Chronic obstructive pulmonary disease with acute lower respiratory infection: Secondary | ICD-10-CM | POA: Diagnosis not present

## 2016-10-22 DIAGNOSIS — R40241 Glasgow coma scale score 13-15, unspecified time: Secondary | ICD-10-CM | POA: Diagnosis present

## 2016-10-22 DIAGNOSIS — R0602 Shortness of breath: Secondary | ICD-10-CM | POA: Diagnosis not present

## 2016-10-22 DIAGNOSIS — R0902 Hypoxemia: Secondary | ICD-10-CM | POA: Diagnosis not present

## 2016-10-22 DIAGNOSIS — J9601 Acute respiratory failure with hypoxia: Secondary | ICD-10-CM | POA: Diagnosis present

## 2016-10-22 DIAGNOSIS — Z7982 Long term (current) use of aspirin: Secondary | ICD-10-CM | POA: Diagnosis not present

## 2016-10-22 DIAGNOSIS — M4854XA Collapsed vertebra, not elsewhere classified, thoracic region, initial encounter for fracture: Secondary | ICD-10-CM | POA: Diagnosis present

## 2016-10-22 DIAGNOSIS — M546 Pain in thoracic spine: Secondary | ICD-10-CM | POA: Diagnosis not present

## 2016-10-22 DIAGNOSIS — J441 Chronic obstructive pulmonary disease with (acute) exacerbation: Secondary | ICD-10-CM | POA: Diagnosis present

## 2016-10-22 DIAGNOSIS — Z885 Allergy status to narcotic agent status: Secondary | ICD-10-CM | POA: Diagnosis not present

## 2016-10-22 DIAGNOSIS — G4700 Insomnia, unspecified: Secondary | ICD-10-CM | POA: Diagnosis present

## 2016-10-22 DIAGNOSIS — S322XXA Fracture of coccyx, initial encounter for closed fracture: Secondary | ICD-10-CM | POA: Diagnosis present

## 2016-10-22 DIAGNOSIS — I10 Essential (primary) hypertension: Secondary | ICD-10-CM | POA: Diagnosis present

## 2016-10-22 DIAGNOSIS — E871 Hypo-osmolality and hyponatremia: Secondary | ICD-10-CM | POA: Diagnosis present

## 2016-10-22 DIAGNOSIS — Z9181 History of falling: Secondary | ICD-10-CM | POA: Diagnosis not present

## 2016-10-22 DIAGNOSIS — G894 Chronic pain syndrome: Secondary | ICD-10-CM | POA: Diagnosis present

## 2016-10-22 DIAGNOSIS — R0682 Tachypnea, not elsewhere classified: Secondary | ICD-10-CM | POA: Diagnosis not present

## 2016-10-22 DIAGNOSIS — R5381 Other malaise: Secondary | ICD-10-CM | POA: Diagnosis present

## 2016-10-22 DIAGNOSIS — N320 Bladder-neck obstruction: Secondary | ICD-10-CM | POA: Diagnosis present

## 2016-10-22 DIAGNOSIS — J440 Chronic obstructive pulmonary disease with acute lower respiratory infection: Secondary | ICD-10-CM | POA: Diagnosis present

## 2016-10-22 DIAGNOSIS — Z96643 Presence of artificial hip joint, bilateral: Secondary | ICD-10-CM | POA: Diagnosis present

## 2016-10-22 DIAGNOSIS — Z87891 Personal history of nicotine dependence: Secondary | ICD-10-CM | POA: Diagnosis not present

## 2016-10-22 DIAGNOSIS — J189 Pneumonia, unspecified organism: Secondary | ICD-10-CM | POA: Diagnosis present

## 2016-10-29 DIAGNOSIS — I10 Essential (primary) hypertension: Secondary | ICD-10-CM | POA: Diagnosis not present

## 2016-10-29 DIAGNOSIS — S72343D Displaced spiral fracture of shaft of unspecified femur, subsequent encounter for closed fracture with routine healing: Secondary | ICD-10-CM | POA: Diagnosis not present

## 2016-10-31 DIAGNOSIS — I10 Essential (primary) hypertension: Secondary | ICD-10-CM | POA: Diagnosis not present

## 2016-10-31 DIAGNOSIS — S72343D Displaced spiral fracture of shaft of unspecified femur, subsequent encounter for closed fracture with routine healing: Secondary | ICD-10-CM | POA: Diagnosis not present

## 2016-11-03 DIAGNOSIS — I10 Essential (primary) hypertension: Secondary | ICD-10-CM | POA: Diagnosis not present

## 2016-11-03 DIAGNOSIS — S72343D Displaced spiral fracture of shaft of unspecified femur, subsequent encounter for closed fracture with routine healing: Secondary | ICD-10-CM | POA: Diagnosis not present

## 2016-11-05 DIAGNOSIS — I10 Essential (primary) hypertension: Secondary | ICD-10-CM | POA: Diagnosis not present

## 2016-11-05 DIAGNOSIS — S72343D Displaced spiral fracture of shaft of unspecified femur, subsequent encounter for closed fracture with routine healing: Secondary | ICD-10-CM | POA: Diagnosis not present

## 2016-11-06 DIAGNOSIS — I10 Essential (primary) hypertension: Secondary | ICD-10-CM | POA: Diagnosis not present

## 2016-11-06 DIAGNOSIS — S72343D Displaced spiral fracture of shaft of unspecified femur, subsequent encounter for closed fracture with routine healing: Secondary | ICD-10-CM | POA: Diagnosis not present

## 2016-11-07 DIAGNOSIS — I10 Essential (primary) hypertension: Secondary | ICD-10-CM | POA: Diagnosis not present

## 2016-11-07 DIAGNOSIS — S72343D Displaced spiral fracture of shaft of unspecified femur, subsequent encounter for closed fracture with routine healing: Secondary | ICD-10-CM | POA: Diagnosis not present

## 2016-11-10 DIAGNOSIS — M1991 Primary osteoarthritis, unspecified site: Secondary | ICD-10-CM | POA: Diagnosis not present

## 2016-11-10 DIAGNOSIS — J449 Chronic obstructive pulmonary disease, unspecified: Secondary | ICD-10-CM | POA: Diagnosis not present

## 2016-11-10 DIAGNOSIS — F419 Anxiety disorder, unspecified: Secondary | ICD-10-CM | POA: Diagnosis not present

## 2016-11-10 DIAGNOSIS — S72343D Displaced spiral fracture of shaft of unspecified femur, subsequent encounter for closed fracture with routine healing: Secondary | ICD-10-CM | POA: Diagnosis not present

## 2016-11-10 DIAGNOSIS — G2581 Restless legs syndrome: Secondary | ICD-10-CM | POA: Diagnosis not present

## 2016-11-10 DIAGNOSIS — F329 Major depressive disorder, single episode, unspecified: Secondary | ICD-10-CM | POA: Diagnosis not present

## 2016-11-10 DIAGNOSIS — N320 Bladder-neck obstruction: Secondary | ICD-10-CM | POA: Diagnosis not present

## 2016-11-10 DIAGNOSIS — N401 Enlarged prostate with lower urinary tract symptoms: Secondary | ICD-10-CM | POA: Diagnosis not present

## 2016-11-10 DIAGNOSIS — K219 Gastro-esophageal reflux disease without esophagitis: Secondary | ICD-10-CM | POA: Diagnosis not present

## 2016-11-10 DIAGNOSIS — E876 Hypokalemia: Secondary | ICD-10-CM | POA: Diagnosis not present

## 2016-11-10 DIAGNOSIS — I10 Essential (primary) hypertension: Secondary | ICD-10-CM | POA: Diagnosis not present

## 2016-11-10 DIAGNOSIS — G4700 Insomnia, unspecified: Secondary | ICD-10-CM | POA: Diagnosis not present

## 2016-11-11 DIAGNOSIS — I10 Essential (primary) hypertension: Secondary | ICD-10-CM | POA: Diagnosis not present

## 2016-11-11 DIAGNOSIS — S72343D Displaced spiral fracture of shaft of unspecified femur, subsequent encounter for closed fracture with routine healing: Secondary | ICD-10-CM | POA: Diagnosis not present

## 2016-11-12 DIAGNOSIS — J449 Chronic obstructive pulmonary disease, unspecified: Secondary | ICD-10-CM | POA: Diagnosis not present

## 2016-11-12 DIAGNOSIS — Z7982 Long term (current) use of aspirin: Secondary | ICD-10-CM | POA: Diagnosis not present

## 2016-11-12 DIAGNOSIS — Z79899 Other long term (current) drug therapy: Secondary | ICD-10-CM | POA: Diagnosis not present

## 2016-11-12 DIAGNOSIS — J181 Lobar pneumonia, unspecified organism: Secondary | ICD-10-CM | POA: Diagnosis not present

## 2016-11-12 DIAGNOSIS — Z1389 Encounter for screening for other disorder: Secondary | ICD-10-CM | POA: Diagnosis not present

## 2016-11-12 DIAGNOSIS — J441 Chronic obstructive pulmonary disease with (acute) exacerbation: Secondary | ICD-10-CM | POA: Diagnosis not present

## 2016-11-12 DIAGNOSIS — Z885 Allergy status to narcotic agent status: Secondary | ICD-10-CM | POA: Diagnosis not present

## 2016-11-12 DIAGNOSIS — J440 Chronic obstructive pulmonary disease with acute lower respiratory infection: Secondary | ICD-10-CM | POA: Diagnosis not present

## 2016-11-12 DIAGNOSIS — Z967 Presence of other bone and tendon implants: Secondary | ICD-10-CM | POA: Diagnosis not present

## 2016-11-12 DIAGNOSIS — J209 Acute bronchitis, unspecified: Secondary | ICD-10-CM | POA: Diagnosis not present

## 2016-11-12 DIAGNOSIS — R0602 Shortness of breath: Secondary | ICD-10-CM | POA: Diagnosis not present

## 2016-11-12 DIAGNOSIS — Z8701 Personal history of pneumonia (recurrent): Secondary | ICD-10-CM | POA: Diagnosis not present

## 2016-11-12 DIAGNOSIS — Z9049 Acquired absence of other specified parts of digestive tract: Secondary | ICD-10-CM | POA: Diagnosis not present

## 2016-11-12 DIAGNOSIS — R0603 Acute respiratory distress: Secondary | ICD-10-CM | POA: Diagnosis not present

## 2016-11-13 DIAGNOSIS — I10 Essential (primary) hypertension: Secondary | ICD-10-CM | POA: Diagnosis not present

## 2016-11-13 DIAGNOSIS — S72343D Displaced spiral fracture of shaft of unspecified femur, subsequent encounter for closed fracture with routine healing: Secondary | ICD-10-CM | POA: Diagnosis not present

## 2016-11-14 DIAGNOSIS — I10 Essential (primary) hypertension: Secondary | ICD-10-CM | POA: Diagnosis not present

## 2016-11-14 DIAGNOSIS — S72343D Displaced spiral fracture of shaft of unspecified femur, subsequent encounter for closed fracture with routine healing: Secondary | ICD-10-CM | POA: Diagnosis not present

## 2016-11-17 DIAGNOSIS — S72343D Displaced spiral fracture of shaft of unspecified femur, subsequent encounter for closed fracture with routine healing: Secondary | ICD-10-CM | POA: Diagnosis not present

## 2016-11-17 DIAGNOSIS — I10 Essential (primary) hypertension: Secondary | ICD-10-CM | POA: Diagnosis not present

## 2016-11-19 DIAGNOSIS — I10 Essential (primary) hypertension: Secondary | ICD-10-CM | POA: Diagnosis not present

## 2016-11-19 DIAGNOSIS — S72343D Displaced spiral fracture of shaft of unspecified femur, subsequent encounter for closed fracture with routine healing: Secondary | ICD-10-CM | POA: Diagnosis not present

## 2016-11-21 DIAGNOSIS — I10 Essential (primary) hypertension: Secondary | ICD-10-CM | POA: Diagnosis not present

## 2016-11-21 DIAGNOSIS — S72343D Displaced spiral fracture of shaft of unspecified femur, subsequent encounter for closed fracture with routine healing: Secondary | ICD-10-CM | POA: Diagnosis not present

## 2016-11-24 DIAGNOSIS — I10 Essential (primary) hypertension: Secondary | ICD-10-CM | POA: Diagnosis not present

## 2016-11-24 DIAGNOSIS — S72343D Displaced spiral fracture of shaft of unspecified femur, subsequent encounter for closed fracture with routine healing: Secondary | ICD-10-CM | POA: Diagnosis not present

## 2016-11-27 DIAGNOSIS — I10 Essential (primary) hypertension: Secondary | ICD-10-CM | POA: Diagnosis not present

## 2016-11-27 DIAGNOSIS — S72343D Displaced spiral fracture of shaft of unspecified femur, subsequent encounter for closed fracture with routine healing: Secondary | ICD-10-CM | POA: Diagnosis not present

## 2016-11-28 DIAGNOSIS — I10 Essential (primary) hypertension: Secondary | ICD-10-CM | POA: Diagnosis not present

## 2016-11-28 DIAGNOSIS — S72343D Displaced spiral fracture of shaft of unspecified femur, subsequent encounter for closed fracture with routine healing: Secondary | ICD-10-CM | POA: Diagnosis not present

## 2016-12-03 DIAGNOSIS — S72343D Displaced spiral fracture of shaft of unspecified femur, subsequent encounter for closed fracture with routine healing: Secondary | ICD-10-CM | POA: Diagnosis not present

## 2016-12-03 DIAGNOSIS — I10 Essential (primary) hypertension: Secondary | ICD-10-CM | POA: Diagnosis not present

## 2017-01-31 ENCOUNTER — Emergency Department (HOSPITAL_BASED_OUTPATIENT_CLINIC_OR_DEPARTMENT_OTHER): Payer: Medicare Other

## 2017-01-31 ENCOUNTER — Encounter (HOSPITAL_BASED_OUTPATIENT_CLINIC_OR_DEPARTMENT_OTHER): Payer: Self-pay | Admitting: Emergency Medicine

## 2017-01-31 ENCOUNTER — Inpatient Hospital Stay (HOSPITAL_BASED_OUTPATIENT_CLINIC_OR_DEPARTMENT_OTHER)
Admission: EM | Admit: 2017-01-31 | Discharge: 2017-03-03 | DRG: 963 | Disposition: E | Payer: Medicare Other | Attending: Pulmonary Disease | Admitting: Pulmonary Disease

## 2017-01-31 ENCOUNTER — Other Ambulatory Visit: Payer: Self-pay

## 2017-01-31 DIAGNOSIS — N400 Enlarged prostate without lower urinary tract symptoms: Secondary | ICD-10-CM | POA: Diagnosis present

## 2017-01-31 DIAGNOSIS — Z9842 Cataract extraction status, left eye: Secondary | ICD-10-CM

## 2017-01-31 DIAGNOSIS — S36892A Contusion of other intra-abdominal organs, initial encounter: Secondary | ICD-10-CM | POA: Diagnosis not present

## 2017-01-31 DIAGNOSIS — Y92009 Unspecified place in unspecified non-institutional (private) residence as the place of occurrence of the external cause: Secondary | ICD-10-CM | POA: Diagnosis not present

## 2017-01-31 DIAGNOSIS — I2510 Atherosclerotic heart disease of native coronary artery without angina pectoris: Secondary | ICD-10-CM | POA: Diagnosis present

## 2017-01-31 DIAGNOSIS — E440 Moderate protein-calorie malnutrition: Secondary | ICD-10-CM | POA: Diagnosis not present

## 2017-01-31 DIAGNOSIS — S72001A Fracture of unspecified part of neck of right femur, initial encounter for closed fracture: Secondary | ICD-10-CM | POA: Diagnosis present

## 2017-01-31 DIAGNOSIS — K432 Incisional hernia without obstruction or gangrene: Secondary | ICD-10-CM | POA: Diagnosis present

## 2017-01-31 DIAGNOSIS — J9600 Acute respiratory failure, unspecified whether with hypoxia or hypercapnia: Secondary | ICD-10-CM

## 2017-01-31 DIAGNOSIS — I472 Ventricular tachycardia: Secondary | ICD-10-CM | POA: Diagnosis present

## 2017-01-31 DIAGNOSIS — J9690 Respiratory failure, unspecified, unspecified whether with hypoxia or hypercapnia: Secondary | ICD-10-CM

## 2017-01-31 DIAGNOSIS — S3282XA Multiple fractures of pelvis without disruption of pelvic ring, initial encounter for closed fracture: Secondary | ICD-10-CM | POA: Diagnosis not present

## 2017-01-31 DIAGNOSIS — J441 Chronic obstructive pulmonary disease with (acute) exacerbation: Secondary | ICD-10-CM | POA: Diagnosis present

## 2017-01-31 DIAGNOSIS — Z87891 Personal history of nicotine dependence: Secondary | ICD-10-CM | POA: Diagnosis not present

## 2017-01-31 DIAGNOSIS — Z9289 Personal history of other medical treatment: Secondary | ICD-10-CM

## 2017-01-31 DIAGNOSIS — Z66 Do not resuscitate: Secondary | ICD-10-CM | POA: Diagnosis not present

## 2017-01-31 DIAGNOSIS — L8990 Pressure ulcer of unspecified site, unspecified stage: Secondary | ICD-10-CM | POA: Diagnosis present

## 2017-01-31 DIAGNOSIS — Z825 Family history of asthma and other chronic lower respiratory diseases: Secondary | ICD-10-CM

## 2017-01-31 DIAGNOSIS — R918 Other nonspecific abnormal finding of lung field: Secondary | ICD-10-CM | POA: Diagnosis not present

## 2017-01-31 DIAGNOSIS — Z9981 Dependence on supplemental oxygen: Secondary | ICD-10-CM

## 2017-01-31 DIAGNOSIS — S3993XA Unspecified injury of pelvis, initial encounter: Secondary | ICD-10-CM | POA: Diagnosis not present

## 2017-01-31 DIAGNOSIS — S2241XA Multiple fractures of ribs, right side, initial encounter for closed fracture: Secondary | ICD-10-CM | POA: Diagnosis not present

## 2017-01-31 DIAGNOSIS — Z8711 Personal history of peptic ulcer disease: Secondary | ICD-10-CM

## 2017-01-31 DIAGNOSIS — K219 Gastro-esophageal reflux disease without esophagitis: Secondary | ICD-10-CM | POA: Diagnosis not present

## 2017-01-31 DIAGNOSIS — I110 Hypertensive heart disease with heart failure: Secondary | ICD-10-CM | POA: Diagnosis present

## 2017-01-31 DIAGNOSIS — Z7189 Other specified counseling: Secondary | ICD-10-CM

## 2017-01-31 DIAGNOSIS — I480 Paroxysmal atrial fibrillation: Secondary | ICD-10-CM | POA: Diagnosis present

## 2017-01-31 DIAGNOSIS — J690 Pneumonitis due to inhalation of food and vomit: Secondary | ICD-10-CM | POA: Diagnosis not present

## 2017-01-31 DIAGNOSIS — S36899A Unspecified injury of other intra-abdominal organs, initial encounter: Secondary | ICD-10-CM | POA: Diagnosis present

## 2017-01-31 DIAGNOSIS — Z7982 Long term (current) use of aspirin: Secondary | ICD-10-CM

## 2017-01-31 DIAGNOSIS — Z823 Family history of stroke: Secondary | ICD-10-CM

## 2017-01-31 DIAGNOSIS — M25551 Pain in right hip: Secondary | ICD-10-CM | POA: Diagnosis not present

## 2017-01-31 DIAGNOSIS — D649 Anemia, unspecified: Secondary | ICD-10-CM | POA: Diagnosis not present

## 2017-01-31 DIAGNOSIS — Z23 Encounter for immunization: Secondary | ICD-10-CM | POA: Diagnosis not present

## 2017-01-31 DIAGNOSIS — J9621 Acute and chronic respiratory failure with hypoxia: Secondary | ICD-10-CM | POA: Diagnosis not present

## 2017-01-31 DIAGNOSIS — I4891 Unspecified atrial fibrillation: Secondary | ICD-10-CM | POA: Diagnosis present

## 2017-01-31 DIAGNOSIS — I5030 Unspecified diastolic (congestive) heart failure: Secondary | ICD-10-CM | POA: Diagnosis not present

## 2017-01-31 DIAGNOSIS — Z9181 History of falling: Secondary | ICD-10-CM

## 2017-01-31 DIAGNOSIS — Z6831 Body mass index (BMI) 31.0-31.9, adult: Secondary | ICD-10-CM

## 2017-01-31 DIAGNOSIS — Z903 Acquired absence of stomach [part of]: Secondary | ICD-10-CM

## 2017-01-31 DIAGNOSIS — S32401A Unspecified fracture of right acetabulum, initial encounter for closed fracture: Principal | ICD-10-CM | POA: Diagnosis present

## 2017-01-31 DIAGNOSIS — R0902 Hypoxemia: Secondary | ICD-10-CM

## 2017-01-31 DIAGNOSIS — S32433A Displaced fracture of anterior column [iliopubic] of unspecified acetabulum, initial encounter for closed fracture: Secondary | ICD-10-CM

## 2017-01-31 DIAGNOSIS — K567 Ileus, unspecified: Secondary | ICD-10-CM | POA: Diagnosis not present

## 2017-01-31 DIAGNOSIS — S40811A Abrasion of right upper arm, initial encounter: Secondary | ICD-10-CM | POA: Diagnosis present

## 2017-01-31 DIAGNOSIS — R296 Repeated falls: Secondary | ICD-10-CM | POA: Diagnosis present

## 2017-01-31 DIAGNOSIS — Z515 Encounter for palliative care: Secondary | ICD-10-CM | POA: Diagnosis not present

## 2017-01-31 DIAGNOSIS — G8929 Other chronic pain: Secondary | ICD-10-CM | POA: Diagnosis not present

## 2017-01-31 DIAGNOSIS — S8991XA Unspecified injury of right lower leg, initial encounter: Secondary | ICD-10-CM | POA: Diagnosis not present

## 2017-01-31 DIAGNOSIS — S32443A Displaced fracture of posterior column [ilioischial] of unspecified acetabulum, initial encounter for closed fracture: Secondary | ICD-10-CM

## 2017-01-31 DIAGNOSIS — S79911A Unspecified injury of right hip, initial encounter: Secondary | ICD-10-CM | POA: Diagnosis not present

## 2017-01-31 DIAGNOSIS — Z79891 Long term (current) use of opiate analgesic: Secondary | ICD-10-CM

## 2017-01-31 DIAGNOSIS — I952 Hypotension due to drugs: Secondary | ICD-10-CM | POA: Diagnosis not present

## 2017-01-31 DIAGNOSIS — S32591A Other specified fracture of right pubis, initial encounter for closed fracture: Secondary | ICD-10-CM | POA: Diagnosis present

## 2017-01-31 DIAGNOSIS — W109XXA Fall (on) (from) unspecified stairs and steps, initial encounter: Secondary | ICD-10-CM | POA: Diagnosis present

## 2017-01-31 DIAGNOSIS — J9602 Acute respiratory failure with hypercapnia: Secondary | ICD-10-CM

## 2017-01-31 DIAGNOSIS — T148XXA Other injury of unspecified body region, initial encounter: Secondary | ICD-10-CM

## 2017-01-31 DIAGNOSIS — Z79899 Other long term (current) drug therapy: Secondary | ICD-10-CM

## 2017-01-31 DIAGNOSIS — Z0189 Encounter for other specified special examinations: Secondary | ICD-10-CM

## 2017-01-31 DIAGNOSIS — R34 Anuria and oliguria: Secondary | ICD-10-CM | POA: Diagnosis not present

## 2017-01-31 DIAGNOSIS — R9439 Abnormal result of other cardiovascular function study: Secondary | ICD-10-CM

## 2017-01-31 DIAGNOSIS — J449 Chronic obstructive pulmonary disease, unspecified: Secondary | ICD-10-CM

## 2017-01-31 DIAGNOSIS — Z9841 Cataract extraction status, right eye: Secondary | ICD-10-CM

## 2017-01-31 DIAGNOSIS — R451 Restlessness and agitation: Secondary | ICD-10-CM | POA: Diagnosis not present

## 2017-01-31 DIAGNOSIS — S329XXA Fracture of unspecified parts of lumbosacral spine and pelvis, initial encounter for closed fracture: Secondary | ICD-10-CM | POA: Diagnosis present

## 2017-01-31 DIAGNOSIS — S32301A Unspecified fracture of right ilium, initial encounter for closed fracture: Secondary | ICD-10-CM | POA: Diagnosis present

## 2017-01-31 DIAGNOSIS — Z792 Long term (current) use of antibiotics: Secondary | ICD-10-CM

## 2017-01-31 DIAGNOSIS — F329 Major depressive disorder, single episode, unspecified: Secondary | ICD-10-CM | POA: Diagnosis present

## 2017-01-31 DIAGNOSIS — E872 Acidosis: Secondary | ICD-10-CM

## 2017-01-31 DIAGNOSIS — Z7951 Long term (current) use of inhaled steroids: Secondary | ICD-10-CM

## 2017-01-31 DIAGNOSIS — M25521 Pain in right elbow: Secondary | ICD-10-CM | POA: Diagnosis present

## 2017-01-31 LAB — CBC WITH DIFFERENTIAL/PLATELET
BASOS ABS: 0 10*3/uL (ref 0.0–0.1)
Basophils Relative: 0 %
EOS PCT: 1 %
Eosinophils Absolute: 0.2 10*3/uL (ref 0.0–0.7)
HEMATOCRIT: 38.9 % — AB (ref 39.0–52.0)
Hemoglobin: 11.9 g/dL — ABNORMAL LOW (ref 13.0–17.0)
LYMPHS ABS: 0.8 10*3/uL (ref 0.7–4.0)
LYMPHS PCT: 5 %
MCH: 26.3 pg (ref 26.0–34.0)
MCHC: 30.6 g/dL (ref 30.0–36.0)
MCV: 86.1 fL (ref 78.0–100.0)
MONO ABS: 0.8 10*3/uL (ref 0.1–1.0)
Monocytes Relative: 5 %
NEUTROS ABS: 14.2 10*3/uL — AB (ref 1.7–7.7)
Neutrophils Relative %: 89 %
PLATELETS: 190 10*3/uL (ref 150–400)
RBC: 4.52 MIL/uL (ref 4.22–5.81)
RDW: 14.4 % (ref 11.5–15.5)
WBC: 15.9 10*3/uL — AB (ref 4.0–10.5)

## 2017-01-31 LAB — BASIC METABOLIC PANEL
ANION GAP: 4 — AB (ref 5–15)
BUN: 17 mg/dL (ref 6–20)
CHLORIDE: 95 mmol/L — AB (ref 101–111)
CO2: 34 mmol/L — ABNORMAL HIGH (ref 22–32)
Calcium: 8.5 mg/dL — ABNORMAL LOW (ref 8.9–10.3)
Creatinine, Ser: 0.82 mg/dL (ref 0.61–1.24)
GFR calc Af Amer: >60 mL/min (ref >60)
GLUCOSE: 105 mg/dL — AB (ref 65–99)
POTASSIUM: 4.4 mmol/L (ref 3.5–5.1)
Sodium: 133 mmol/L — ABNORMAL LOW (ref 135–145)

## 2017-01-31 MED ORDER — IPRATROPIUM-ALBUTEROL 0.5-2.5 (3) MG/3ML IN SOLN
RESPIRATORY_TRACT | Status: AC
  Administered 2017-01-31: 3 mL
  Filled 2017-01-31: qty 3

## 2017-01-31 MED ORDER — FENTANYL CITRATE (PF) 100 MCG/2ML IJ SOLN
50.0000 ug | Freq: Once | INTRAMUSCULAR | Status: AC
  Administered 2017-01-31: 50 ug via INTRAVENOUS
  Filled 2017-01-31: qty 2

## 2017-01-31 MED ORDER — TETANUS-DIPHTH-ACELL PERTUSSIS 5-2.5-18.5 LF-MCG/0.5 IM SUSP
0.5000 mL | Freq: Once | INTRAMUSCULAR | Status: AC
  Administered 2017-01-31: 0.5 mL via INTRAMUSCULAR
  Filled 2017-01-31: qty 0.5

## 2017-01-31 MED ORDER — MORPHINE SULFATE (PF) 4 MG/ML IV SOLN
4.0000 mg | Freq: Once | INTRAVENOUS | Status: AC
  Administered 2017-01-31: 4 mg via INTRAVENOUS
  Filled 2017-01-31: qty 1

## 2017-01-31 MED ORDER — IPRATROPIUM-ALBUTEROL 0.5-2.5 (3) MG/3ML IN SOLN
3.0000 mL | Freq: Four times a day (QID) | RESPIRATORY_TRACT | Status: DC
  Filled 2017-01-31: qty 3

## 2017-01-31 NOTE — ED Notes (Signed)
PT was using accessory muscles to breath and had circumfrencial cyanosis. PLaced on monitor. Sats at 70%, Per family pt uses oxygen at home normally 2 liters. Placed on Nonrebreather with increase in O2 from 70 to 100%. RT and MD at bedside. Albuterol nebulizer treatment ordered and given.

## 2017-01-31 NOTE — ED Provider Notes (Signed)
[facility] EMERGENCY DEPARTMENT Provider Note   CSN: [id number] Arrival date & time: 02/27/2017  2013     History   Chief Complaint Chief Complaint  Patient presents with  . Fall    HPI Lucienne MinksFarid Kerrigan is a 83 y.o. male.  Patient is a 83 year old Arabic speaking male with a history of COPD, hypertension and duodenal ulcers who presents with hip pain after a fall.  He uses a walker.  He was walking outside with his walker on concrete and he states that his walker hit a can which caused him to stumble and fall over onto his right side.  He has some abrasions to his right arm and significant pain to his right hip.  This happened just prior to arrival.  He denies hitting his head.  He denies any neck or back pain.  He denies any other injuries from the fall.  He states the pain radiates down his right leg.  He has had prior surgery to his right femur and his left hip.      Past Medical History:  Diagnosis Date  . BPH (benign prostatic hypertrophy)   . Bronchial asthma   . COPD (chronic obstructive pulmonary disease) (HCC)   . Depression    hx  . Duodenal ulcer    perforated ~ 2013  . GERD (gastroesophageal reflux disease)   . HTN (hypertension)   . On home oxygen therapy    "8h/day; don't know how many liters"    Patient Active Problem List   Diagnosis Date Noted  . Dyspnea on exertion 04/09/2016  . Bowel obstruction (HCC) 04/09/2016  . Abdominal pain   . Hernia, abdominal 01/25/2016  . GERD without esophagitis 01/25/2016  . Incisional hernia   . Chronic anticoagulation 11/24/2014  . PAF (paroxysmal atrial fibrillation) (HCC) 11/10/2014  . Atrial fibrillation with RVR (HCC) 11/10/2014  . Chest pain 11/10/2014  . RUQ pain 11/10/2014  . Renal cyst, right 11/10/2014  . AAA (abdominal aortic aneurysm) without rupture (HCC) 11/10/2014  . Ventral hernia 11/10/2014  . BPH (benign prostatic hyperplasia) 11/10/2014  . Nausea and vomiting 11/09/2014  . CAP (community  acquired pneumonia) 11/09/2014  . COPD with acute exacerbation (HCC) 11/09/2014  . Abdominal pain, epigastric 11/09/2014  . Right hip pain 11/08/2014  . Acute urinary retention 11/08/2014  . Acute respiratory failure with hypoxia (HCC) 11/08/2014  . Fall   . Hypoxia     Past Surgical History:  Procedure Laterality Date  . ABDOMINAL EXPLORATION SURGERY  02/23/2012  . CATARACT EXTRACTION, BILATERAL    . FRACTURE SURGERY    . GASTRIC ROUX-EN-Y  ~ 1226/2013   closure of duodenal stump w/Roux-en-Y gastro jejunalanastomosis  . HIP FRACTURE SURGERY Right 2000's  . HIP HARDWARE REMOVAL Right ~ 2011  . PARTIAL GASTRECTOMY  2013-2014   distal; w/closure of duodenal stump; gastro jejunalanastomosis  . TRANSURETHRAL RESECTION OF PROSTATE  2016       Home Medications    Prior to Admission medications   Medication Sig Start Date End Date Taking? Authorizing Provider  albuterol (PROVENTIL HFA;VENTOLIN HFA) 108 (90 Base) MCG/ACT inhaler Inhale 2 puffs into the lungs every 6 (six) hours as needed for wheezing or shortness of breath. 04/09/16   Abelino DerrickKilroy, Luke K, PA-C  esomeprazole (NEXIUM) 20 MG capsule Take 20 mg by mouth daily at 12 noon.    [provider]  polyethylene glycol (MIRALAX / GLYCOLAX) packet Take 17 g by mouth daily. 01/31/16   Richarda OverlieAbrol, Nayana, MD  Family History Family History  Problem Relation Age of Onset  . Arrhythmia Son   . Stroke Brother     Social History Social History   Tobacco Use  . Smoking status: Former Smoker    Packs/day: 3.00    Years: 50.00    Pack years: 150.00    Types: Cigarettes  . Smokeless tobacco: Never Used  . Tobacco comment: "quit smoking cigarettes in the 1990's"  Substance Use Topics  . Alcohol use: No    Alcohol/week: 0.0 oz  . Drug use: No     Allergies   Patient has no known allergies.   Review of Systems Review of Systems  Constitutional: Negative for chills, diaphoresis, fatigue and fever.  HENT: Negative for  congestion, rhinorrhea and sneezing.   Eyes: Negative.   Respiratory: Negative for cough, chest tightness and shortness of breath.   Cardiovascular: Negative for chest pain and leg swelling.  Gastrointestinal: Negative for abdominal pain, blood in stool, diarrhea, nausea and vomiting.  Genitourinary: Negative for difficulty urinating, flank pain, frequency and hematuria.  Musculoskeletal: Positive for arthralgias. Negative for back pain.  Skin: Positive for wound. Negative for rash.  Neurological: Negative for dizziness, speech difficulty, weakness, numbness and headaches.     Physical Exam Updated Vital Signs BP 134/79   Pulse 84   Temp 98.1 F (36.7 C) (Oral)   Resp (!) 28   Ht 5' 0.5" (1.537 m)   Wt 73.5 kg (162 lb)   SpO2 97%   BMI 31.12 kg/m   Physical Exam  Constitutional: He is oriented to person, place, and time. He appears well-developed and well-nourished. He appears distressed.  Appears uncomfortable  HENT:  Head: Normocephalic and atraumatic.  Eyes: Pupils are equal, round, and reactive to light.  Neck: Normal range of motion. Neck supple.  No pain to the cervical thoracic or lumbosacral spine  Cardiovascular: Normal rate, regular rhythm and normal heart sounds.  Pulmonary/Chest: Effort normal and breath sounds normal. No respiratory distress. He has no wheezes. He has no rales. He exhibits no tenderness.  Abdominal: Soft. Bowel sounds are normal. There is no tenderness. There is no rebound and no guarding.  Musculoskeletal: Normal range of motion. He exhibits no edema.  Patient has pain on palpation and range of motion of the right hip.  There is also some mild pain to the right knee.  There is no swelling or effusion noted.  There is no pain to the lower leg or ankle.  Pedal pulses are intact.  There are some abrasions to the right forearm and right hand.  There is no underlying bony tenderness.  No other pain on palpation or range of motion of the extremities    Lymphadenopathy:    He has no cervical adenopathy.  Neurological: He is alert and oriented to person, place, and time.  Skin: Skin is warm and dry. No rash noted.  Psychiatric: He has a normal mood and affect.     ED Treatments / Results  Labs (all labs ordered are listed, but only abnormal results are displayed) Labs Reviewed  BASIC METABOLIC PANEL - Abnormal; Notable for the following components:      Result Value   Sodium 133 (*)    Chloride 95 (*)    CO2 34 (*)    Glucose, Bld 105 (*)    Calcium 8.5 (*)    Anion gap 4 (*)    All other components within normal limits  CBC WITH DIFFERENTIAL/PLATELET - Abnormal; Notable  for the following components:   WBC 15.9 (*)    Hemoglobin 11.9 (*)    HCT 38.9 (*)    Neutro Abs 14.2 (*)    All other components within normal limits  PROTIME-INR    EKG  EKG Interpretation  Date/Time:  Saturday 2017-02-10 23:24:25 EST Ventricular Rate:  86 PR Interval:    QRS Duration: 89 QT Interval:  367 QTC Calculation: 439 R Axis:   90 Text Interpretation:  Sinus rhythm Ventricular bigeminy Borderline right axis deviation Confirmed by Rolan Bucco [phone]) on 10-Feb-2017 11:31:32 PM       Radiology Ct Pelvis Wo Contrast  Result Date: 2017/02/10 CLINICAL DATA:  Fall today with right hip pain. Acetabular fracture on radiograph. EXAM: CT PELVIS WITHOUT CONTRAST TECHNIQUE: Multidetector CT imaging of the pelvis was performed following the standard protocol without intravenous contrast. COMPARISON:  Radiographs earlier this day. FINDINGS: Urinary Tract: Irregular bladder wall thickening. Two stones in the right urinary trigone. Bowel: Lower ventral abdominal wall hernia containing small bowel. Mild fecalization of bowel contents in the hernia, no bowel wall thickening or free fluid. No evidence of obstruction. Vascular/Lymphatic: Aorto bi-iliac atherosclerosis. Small bilateral external iliac nodes. Reproductive:  Prostatic calcifications.  Other: Right extraperitoneal stranding consistent secondary to pelvic fractures. Musculoskeletal: Comminuted right pelvic fracture. Comminuted displaced fracture involving the iliac wing extending to the anterior aspect of the sacroiliac joint. Equivocal minimal widening of the lower right sacroiliac joint. Distal fracture extent involves the acetabulum with minimal articular surface displacement. Fracture involves the anterior wall of the acetabulum at the puboacetabular junction. Nondisplaced fracture of the right inferior pubic ramus. Intramedullary rod in the right proximal femur with healed posttraumatic proximal femoral fracture. Intramedullary rod with trans trochanteric screws traverse the left proximal femur and remote intertrochanteric femur fracture. No additional left-sided acute pelvic fracture. Pubic symphysis is congruent with mild chondrocalcinosis. Advanced bony under mineralization. IMPRESSION: 1. Complex right pelvic fracture. Comminuted displaced fracture involves the right iliac wing extending from the anterior aspect of the sacroiliac joint to the anterior acetabulum at the puboacetabular junction. Minimal widening of the lower SI joint. Minimal articular up off of the acetabulum. There is nondisplaced right inferior pubic ramus fracture. 2. Associated extraperitoneal hemorrhage associated with pelvic fractures. 3. Bilateral proximal femoral hardware without periprosthetic fracture. 4. Irregular bladder wall thickening with small bladder stones. Recommend correlation with urinalysis. Bladder wall irregularity was seen on abdominal CT 01/25/2016 and appears chronic. 5.  Aortic Atherosclerosis (ICD10-I70.0). Electronically Signed   By: Rubye Oaks M.D.   On: 02/10/2017 23:02   Dg Chest Port 1 View  Result Date: 02/01/2017 CLINICAL DATA:  Respiratory difficulty EXAM: PORTABLE CHEST 1 VIEW COMPARISON:  01/25/2016 FINDINGS: Diffuse interstitial opacity. No pleural effusion. Mild  cardiomegaly with aortic atherosclerosis. No definite pneumothorax. IMPRESSION: 1. Diffuse interstitial opacity could reflect mild interstitial edema on underlying chronic change. No focal pulmonary infiltrate 2. Mild cardiomegaly Electronically Signed   By: Jasmine Pang M.D.   On: 02/01/2017 00:13   Dg Knee Complete 4 Views Right  Result Date: 2017-02-10 CLINICAL DATA:  Fall on concrete today.  Right hip and knee pain. EXAM: RIGHT KNEE - COMPLETE 4+ VIEW COMPARISON:  Femur radiograph 03/29/2014 FINDINGS: Intramedullary nail distal locking screw in the distal femur, partially included. There is diffuse medially oriented with rod approaching the medial cortex and associated chronic cortical thickening. No acute fracture of the knee. The alignment is maintained. Mild medial tibiofemoral joint space narrowing. No knee joint effusion. Advanced  bony under mineralization. IMPRESSION: 1. No acute fracture of the right knee. 2. Intramedullary rod in the distal femur is partially included, appears medially oriented with chronic cortical thickening. Cortical thickening is likely new from prior exam, and could predispose to hardware failure. Electronically Signed   By: Rubye Oaks M.D.   On: 02/14/2017 22:42   Dg Hip Unilat W Or Wo Pelvis 2-3 Views Right  Result Date: 02/22/2017 CLINICAL DATA:  Acute right hip pain following fall today. Initial encounter. EXAM: DG HIP (WITH OR WITHOUT PELVIS) 2-3V RIGHT COMPARISON:  11/07/2014 radiographs FINDINGS: There is a fracture of the right acetabulum and right iliopubic ring. No dislocation identified. An intramedullary rod within the right femur are is again noted as well as a remote proximal right femur fracture. Left femur intramedullary rod and screw and remote proximal left femur fracture noted. IMPRESSION: Right acetabular and right iliopubic ring fracture. Consider CT for further evaluation. Electronically Signed   By: Harmon Pier M.D.   On: 02/01/2017 21:32     Procedures Procedures (including critical care time)  Medications Ordered in ED Medications  ipratropium-albuterol (DUONEB) 0.5-2.5 (3) MG/3ML nebulizer solution 3 mL (not administered)  furosemide (LASIX) injection 40 mg (not administered)  fentaNYL (SUBLIMAZE) injection 50 mcg (50 mcg Intravenous Given 02/04/2017 2136)  Tdap (BOOSTRIX) injection 0.5 mL (0.5 mLs Intramuscular Given 02/18/2017 2137)  morphine 4 MG/ML injection 4 mg (4 mg Intravenous Given 02/15/2017 2147)  fentaNYL (SUBLIMAZE) injection 50 mcg (50 mcg Intravenous Given 02/09/2017 2315)  ipratropium-albuterol (DUONEB) 0.5-2.5 (3) MG/3ML nebulizer solution (3 mLs  Given 02/01/2017 2330)     Initial Impression / Assessment and Plan / ED Course  I have reviewed the triage vital signs and the nursing notes.  Pertinent labs & imaging results that were available during my care of the patient were reviewed by me and considered in my medical decision making (see chart for details).     Patient is a 83 year old male who is status post mechanical fall.  He injured his right pelvic area but denies any other injuries other than some abrasions to his arm.  His tetanus shot was updated.  X-rays show a complex right pelvic fracture.  However there is no disruption of the pelvic ring.  CT scan was reviewed with the radiologist who feels that the adjacent hemorrhage is consistent with the pelvic fractures and does not appear to represent bleeding out of proportion to the fractures or active hemorrhage.  I spoke with Dr. Charlann Boxer with [facility] orthopedics who will see the patient.  He requests hospitalist admission.  I spoke with Dr. Julian Reil with the hospitalist service who will admit the patient.  Patient has had some respiratory issues since arrival to the ED.  He does not have any pain along his ribs.  He does have a history of COPD and CHF.  He was given a nebulizer treatment as he had diminished breath sounds with some wheezing.  His breathing has  improved with nebulizer treatment to be still requiring oxygen via facemask.  He is on home oxygen at 2 L/min at night and intermittently throughout the day.  Chest x-ray was performed which shows some increased interstitial edema.  On review of medications, it does appear that he is out of his Lasix.  I did give him a dose of Lasix here in the ED.  He will be admitted to stepdown for close monitoring.  Final Clinical Impressions(s) / ED Diagnoses   Final diagnoses:  Multiple closed fractures  of pelvis without disruption of pelvic ring, initial encounter [facility])  COPD exacerbation [facility])    ED Discharge Orders    None       Rolan Bucco, MD 02/01/17 [phone]

## 2017-01-31 NOTE — ED Triage Notes (Signed)
PT presents with c/o right leg pain after fall today.

## 2017-02-01 ENCOUNTER — Encounter (HOSPITAL_COMMUNITY): Payer: Self-pay | Admitting: Internal Medicine

## 2017-02-01 ENCOUNTER — Inpatient Hospital Stay (HOSPITAL_COMMUNITY): Payer: Medicare Other

## 2017-02-01 DIAGNOSIS — G8929 Other chronic pain: Secondary | ICD-10-CM | POA: Diagnosis present

## 2017-02-01 DIAGNOSIS — K219 Gastro-esophageal reflux disease without esophagitis: Secondary | ICD-10-CM | POA: Diagnosis present

## 2017-02-01 DIAGNOSIS — S72001A Fracture of unspecified part of neck of right femur, initial encounter for closed fracture: Secondary | ICD-10-CM | POA: Diagnosis present

## 2017-02-01 DIAGNOSIS — S36892A Contusion of other intra-abdominal organs, initial encounter: Secondary | ICD-10-CM | POA: Diagnosis present

## 2017-02-01 DIAGNOSIS — I4891 Unspecified atrial fibrillation: Secondary | ICD-10-CM

## 2017-02-01 DIAGNOSIS — Z4682 Encounter for fitting and adjustment of non-vascular catheter: Secondary | ICD-10-CM | POA: Diagnosis not present

## 2017-02-01 DIAGNOSIS — Z23 Encounter for immunization: Secondary | ICD-10-CM | POA: Diagnosis not present

## 2017-02-01 DIAGNOSIS — S32433A Displaced fracture of anterior column [iliopubic] of unspecified acetabulum, initial encounter for closed fracture: Secondary | ICD-10-CM | POA: Diagnosis not present

## 2017-02-01 DIAGNOSIS — J90 Pleural effusion, not elsewhere classified: Secondary | ICD-10-CM | POA: Diagnosis not present

## 2017-02-01 DIAGNOSIS — Z6831 Body mass index (BMI) 31.0-31.9, adult: Secondary | ICD-10-CM | POA: Diagnosis not present

## 2017-02-01 DIAGNOSIS — R0902 Hypoxemia: Secondary | ICD-10-CM | POA: Diagnosis not present

## 2017-02-01 DIAGNOSIS — S329XXA Fracture of unspecified parts of lumbosacral spine and pelvis, initial encounter for closed fracture: Secondary | ICD-10-CM | POA: Diagnosis not present

## 2017-02-01 DIAGNOSIS — N400 Enlarged prostate without lower urinary tract symptoms: Secondary | ICD-10-CM | POA: Diagnosis present

## 2017-02-01 DIAGNOSIS — J690 Pneumonitis due to inhalation of food and vomit: Secondary | ICD-10-CM | POA: Diagnosis not present

## 2017-02-01 DIAGNOSIS — E440 Moderate protein-calorie malnutrition: Secondary | ICD-10-CM | POA: Diagnosis present

## 2017-02-01 DIAGNOSIS — S32591A Other specified fracture of right pubis, initial encounter for closed fracture: Secondary | ICD-10-CM | POA: Diagnosis present

## 2017-02-01 DIAGNOSIS — Z7189 Other specified counseling: Secondary | ICD-10-CM | POA: Diagnosis not present

## 2017-02-01 DIAGNOSIS — Y92009 Unspecified place in unspecified non-institutional (private) residence as the place of occurrence of the external cause: Secondary | ICD-10-CM | POA: Diagnosis not present

## 2017-02-01 DIAGNOSIS — S3282XA Multiple fractures of pelvis without disruption of pelvic ring, initial encounter for closed fracture: Secondary | ICD-10-CM | POA: Diagnosis not present

## 2017-02-01 DIAGNOSIS — I952 Hypotension due to drugs: Secondary | ICD-10-CM | POA: Diagnosis not present

## 2017-02-01 DIAGNOSIS — I5030 Unspecified diastolic (congestive) heart failure: Secondary | ICD-10-CM | POA: Diagnosis present

## 2017-02-01 DIAGNOSIS — S36899A Unspecified injury of other intra-abdominal organs, initial encounter: Secondary | ICD-10-CM | POA: Diagnosis present

## 2017-02-01 DIAGNOSIS — J9620 Acute and chronic respiratory failure, unspecified whether with hypoxia or hypercapnia: Secondary | ICD-10-CM

## 2017-02-01 DIAGNOSIS — G9340 Encephalopathy, unspecified: Secondary | ICD-10-CM | POA: Diagnosis not present

## 2017-02-01 DIAGNOSIS — Z8719 Personal history of other diseases of the digestive system: Secondary | ICD-10-CM | POA: Insufficient documentation

## 2017-02-01 DIAGNOSIS — J9621 Acute and chronic respiratory failure with hypoxia: Secondary | ICD-10-CM | POA: Diagnosis not present

## 2017-02-01 DIAGNOSIS — T148XXA Other injury of unspecified body region, initial encounter: Secondary | ICD-10-CM | POA: Diagnosis not present

## 2017-02-01 DIAGNOSIS — Z66 Do not resuscitate: Secondary | ICD-10-CM | POA: Diagnosis not present

## 2017-02-01 DIAGNOSIS — S2241XA Multiple fractures of ribs, right side, initial encounter for closed fracture: Secondary | ICD-10-CM | POA: Diagnosis present

## 2017-02-01 DIAGNOSIS — I472 Ventricular tachycardia: Secondary | ICD-10-CM | POA: Diagnosis present

## 2017-02-01 DIAGNOSIS — S32443A Displaced fracture of posterior column [ilioischial] of unspecified acetabulum, initial encounter for closed fracture: Secondary | ICD-10-CM | POA: Diagnosis not present

## 2017-02-01 DIAGNOSIS — J441 Chronic obstructive pulmonary disease with (acute) exacerbation: Secondary | ICD-10-CM | POA: Diagnosis not present

## 2017-02-01 DIAGNOSIS — K567 Ileus, unspecified: Secondary | ICD-10-CM | POA: Diagnosis present

## 2017-02-01 DIAGNOSIS — I110 Hypertensive heart disease with heart failure: Secondary | ICD-10-CM | POA: Diagnosis present

## 2017-02-01 DIAGNOSIS — I2510 Atherosclerotic heart disease of native coronary artery without angina pectoris: Secondary | ICD-10-CM

## 2017-02-01 DIAGNOSIS — S32401A Unspecified fracture of right acetabulum, initial encounter for closed fracture: Secondary | ICD-10-CM | POA: Diagnosis not present

## 2017-02-01 DIAGNOSIS — R9439 Abnormal result of other cardiovascular function study: Secondary | ICD-10-CM | POA: Diagnosis not present

## 2017-02-01 DIAGNOSIS — Z515 Encounter for palliative care: Secondary | ICD-10-CM | POA: Diagnosis not present

## 2017-02-01 DIAGNOSIS — D649 Anemia, unspecified: Secondary | ICD-10-CM | POA: Diagnosis present

## 2017-02-01 DIAGNOSIS — W108XXA Fall (on) (from) other stairs and steps, initial encounter: Secondary | ICD-10-CM | POA: Diagnosis not present

## 2017-02-01 DIAGNOSIS — J9600 Acute respiratory failure, unspecified whether with hypoxia or hypercapnia: Secondary | ICD-10-CM | POA: Diagnosis not present

## 2017-02-01 DIAGNOSIS — W109XXA Fall (on) (from) unspecified stairs and steps, initial encounter: Secondary | ICD-10-CM | POA: Diagnosis not present

## 2017-02-01 DIAGNOSIS — I480 Paroxysmal atrial fibrillation: Secondary | ICD-10-CM | POA: Diagnosis not present

## 2017-02-01 DIAGNOSIS — S2231XA Fracture of one rib, right side, initial encounter for closed fracture: Secondary | ICD-10-CM | POA: Diagnosis not present

## 2017-02-01 DIAGNOSIS — J9690 Respiratory failure, unspecified, unspecified whether with hypoxia or hypercapnia: Secondary | ICD-10-CM | POA: Diagnosis not present

## 2017-02-01 DIAGNOSIS — K432 Incisional hernia without obstruction or gangrene: Secondary | ICD-10-CM | POA: Diagnosis not present

## 2017-02-01 DIAGNOSIS — R079 Chest pain, unspecified: Secondary | ICD-10-CM | POA: Diagnosis not present

## 2017-02-01 LAB — BASIC METABOLIC PANEL
Anion gap: 8 (ref 5–15)
BUN: 20 mg/dL (ref 6–20)
CALCIUM: 8.2 mg/dL — AB (ref 8.9–10.3)
CHLORIDE: 94 mmol/L — AB (ref 101–111)
CO2: 32 mmol/L (ref 22–32)
CREATININE: 0.92 mg/dL (ref 0.61–1.24)
GFR calc Af Amer: >60 mL/min (ref >60)
GFR calc non Af Amer: >60 mL/min (ref >60)
Glucose, Bld: 120 mg/dL — ABNORMAL HIGH (ref 65–99)
Potassium: 4.4 mmol/L (ref 3.5–5.1)
SODIUM: 134 mmol/L — AB (ref 135–145)

## 2017-02-01 LAB — POCT I-STAT 3, ART BLOOD GAS (G3+)
ACID-BASE EXCESS: 6 mmol/L — AB (ref 0.0–2.0)
BICARBONATE: 33.4 mmol/L — AB (ref 20.0–28.0)
O2 SAT: 93 %
PO2 ART: 71 mmHg — AB (ref 83.0–108.0)
TCO2: 35 mmol/L — ABNORMAL HIGH (ref 22–32)
pCO2 arterial: 61.2 mmHg — ABNORMAL HIGH (ref 32.0–48.0)
pH, Arterial: 7.345 — ABNORMAL LOW (ref 7.350–7.450)

## 2017-02-01 LAB — CBC
HCT: 39.7 % (ref 39.0–52.0)
Hemoglobin: 12.2 g/dL — ABNORMAL LOW (ref 13.0–17.0)
MCH: 26.4 pg (ref 26.0–34.0)
MCHC: 30.7 g/dL (ref 30.0–36.0)
MCV: 85.9 fL (ref 78.0–100.0)
PLATELETS: 159 10*3/uL (ref 150–400)
RBC: 4.62 MIL/uL (ref 4.22–5.81)
RDW: 14.3 % (ref 11.5–15.5)
WBC: 15.7 10*3/uL — AB (ref 4.0–10.5)

## 2017-02-01 LAB — PROTIME-INR
INR: 1.17
INR: 1.19
Prothrombin Time: 14.8 seconds (ref 11.4–15.2)
Prothrombin Time: 15 seconds (ref 11.4–15.2)

## 2017-02-01 LAB — CBC WITH DIFFERENTIAL/PLATELET
Basophils Absolute: 0 10*3/uL (ref 0.0–0.1)
Basophils Relative: 0 %
EOS ABS: 0.4 10*3/uL (ref 0.0–0.7)
EOS PCT: 3 %
HCT: 38.8 % — ABNORMAL LOW (ref 39.0–52.0)
Hemoglobin: 11.9 g/dL — ABNORMAL LOW (ref 13.0–17.0)
LYMPHS ABS: 0.8 10*3/uL (ref 0.7–4.0)
LYMPHS PCT: 7 %
MCH: 26.7 pg (ref 26.0–34.0)
MCHC: 30.7 g/dL (ref 30.0–36.0)
MCV: 87 fL (ref 78.0–100.0)
MONO ABS: 0.7 10*3/uL (ref 0.1–1.0)
Monocytes Relative: 6 %
Neutro Abs: 10.1 10*3/uL — ABNORMAL HIGH (ref 1.7–7.7)
Neutrophils Relative %: 84 %
PLATELETS: 147 10*3/uL — AB (ref 150–400)
RBC: 4.46 MIL/uL (ref 4.22–5.81)
RDW: 14.5 % (ref 11.5–15.5)
WBC: 12 10*3/uL — ABNORMAL HIGH (ref 4.0–10.5)

## 2017-02-01 LAB — TROPONIN I: Troponin I: <0.03 ng/mL (ref <0.03)

## 2017-02-01 LAB — MRSA PCR SCREENING: MRSA BY PCR: INVALID — AB

## 2017-02-01 LAB — I-STAT CG4 LACTIC ACID, ED: Lactic Acid, Venous: 0.73 mmol/L (ref 0.5–1.9)

## 2017-02-01 LAB — MAGNESIUM: MAGNESIUM: 2 mg/dL (ref 1.7–2.4)

## 2017-02-01 LAB — TSH: TSH: 1.438 u[IU]/mL (ref 0.350–4.500)

## 2017-02-01 LAB — BRAIN NATRIURETIC PEPTIDE: B Natriuretic Peptide: 217.4 pg/mL — ABNORMAL HIGH (ref 0.0–100.0)

## 2017-02-01 LAB — APTT: aPTT: 35 seconds (ref 24–36)

## 2017-02-01 MED ORDER — FUROSEMIDE 10 MG/ML IJ SOLN
40.0000 mg | Freq: Once | INTRAMUSCULAR | Status: AC
  Administered 2017-02-01: 40 mg via INTRAVENOUS
  Filled 2017-02-01: qty 4

## 2017-02-01 MED ORDER — SODIUM CHLORIDE 0.9 % IV SOLN
INTRAVENOUS | Status: AC
  Administered 2017-02-01: 10:00:00 via INTRAVENOUS

## 2017-02-01 MED ORDER — POTASSIUM CHLORIDE CRYS ER 10 MEQ PO TBCR
10.0000 meq | EXTENDED_RELEASE_TABLET | Freq: Every day | ORAL | Status: DC
  Administered 2017-02-01: 10 meq via ORAL
  Filled 2017-02-01 (×4): qty 1

## 2017-02-01 MED ORDER — TRAMADOL HCL 50 MG PO TABS: 50.0000 mg | ORAL_TABLET | Freq: Four times a day (QID) | ORAL | Status: DC | PRN

## 2017-02-01 MED ORDER — BUSPIRONE HCL 5 MG PO TABS: 7.5000 mg | ORAL_TABLET | Freq: Every evening | ORAL | Status: DC | PRN

## 2017-02-01 MED ORDER — LEVALBUTEROL HCL 0.63 MG/3ML IN NEBU
0.6300 mg | INHALATION_SOLUTION | Freq: Three times a day (TID) | RESPIRATORY_TRACT | Status: DC
  Administered 2017-02-01 – 2017-02-16 (×45): 0.63 mg via RESPIRATORY_TRACT
  Filled 2017-02-01 (×59): qty 3

## 2017-02-01 MED ORDER — DILTIAZEM LOAD VIA INFUSION
10.0000 mg | Freq: Once | INTRAVENOUS | Status: DC
  Filled 2017-02-01: qty 10

## 2017-02-01 MED ORDER — FUROSEMIDE 20 MG PO TABS
20.0000 mg | ORAL_TABLET | Freq: Every day | ORAL | Status: DC
  Administered 2017-02-01: 20 mg via ORAL
  Filled 2017-02-01: qty 1

## 2017-02-01 MED ORDER — ACETAMINOPHEN 500 MG PO TABS
500.0000 mg | ORAL_TABLET | Freq: Two times a day (BID) | ORAL | Status: DC
  Administered 2017-02-01 – 2017-02-08 (×15): 500 mg via ORAL
  Filled 2017-02-01 (×16): qty 1

## 2017-02-01 MED ORDER — METOPROLOL TARTRATE 25 MG PO TABS: 25.0000 mg | ORAL_TABLET | Freq: Two times a day (BID) | ORAL | Status: DC

## 2017-02-01 MED ORDER — IPRATROPIUM BROMIDE 0.02 % IN SOLN
0.5000 mg | Freq: Four times a day (QID) | RESPIRATORY_TRACT | Status: DC
  Administered 2017-02-01 (×2): 0.5 mg via RESPIRATORY_TRACT
  Filled 2017-02-01 (×2): qty 2.5

## 2017-02-01 MED ORDER — METOPROLOL TARTRATE 5 MG/5ML IV SOLN: 2.5000 mg | INTRAVENOUS | Status: AC

## 2017-02-01 MED ORDER — PANTOPRAZOLE SODIUM 40 MG PO TBEC
40.0000 mg | DELAYED_RELEASE_TABLET | Freq: Every day | ORAL | Status: DC
  Administered 2017-02-01: 40 mg via ORAL
  Filled 2017-02-01 (×2): qty 1

## 2017-02-01 MED ORDER — DILTIAZEM LOAD VIA INFUSION
15.0000 mg | Freq: Once | INTRAVENOUS | Status: DC | PRN
  Filled 2017-02-01: qty 15

## 2017-02-01 MED ORDER — ACETAMINOPHEN 325 MG PO TABS: 650.0000 mg | ORAL_TABLET | Freq: Once | ORAL | Status: DC

## 2017-02-01 MED ORDER — MORPHINE SULFATE (PF) 4 MG/ML IV SOLN
2.0000 mg | INTRAVENOUS | Status: DC | PRN
  Administered 2017-02-01 (×4): 2 mg via INTRAVENOUS
  Filled 2017-02-01 (×4): qty 1

## 2017-02-01 MED ORDER — SENNOSIDES-DOCUSATE SODIUM 8.6-50 MG PO TABS
1.0000 | ORAL_TABLET | Freq: Every day | ORAL | Status: DC
  Administered 2017-02-01 – 2017-02-03 (×2): 1 via ORAL
  Filled 2017-02-01 (×3): qty 1

## 2017-02-01 MED ORDER — IPRATROPIUM BROMIDE 0.02 % IN SOLN
0.5000 mg | Freq: Three times a day (TID) | RESPIRATORY_TRACT | Status: DC
  Administered 2017-02-01 – 2017-02-16 (×45): 0.5 mg via RESPIRATORY_TRACT
  Filled 2017-02-01 (×45): qty 2.5

## 2017-02-01 MED ORDER — IOPAMIDOL (ISOVUE-370) INJECTION 76%
INTRAVENOUS | Status: AC
Start: 2017-02-01 — End: 2017-02-01
  Administered 2017-02-01: 100 mL via INTRAVENOUS
  Filled 2017-02-01: qty 100

## 2017-02-01 MED ORDER — SERTRALINE HCL 50 MG PO TABS
50.0000 mg | ORAL_TABLET | Freq: Every day | ORAL | Status: DC
  Administered 2017-02-02 – 2017-02-11 (×10): 50 mg via ORAL
  Filled 2017-02-01 (×10): qty 1

## 2017-02-01 MED ORDER — FENTANYL CITRATE (PF) 100 MCG/2ML IJ SOLN
25.0000 ug | INTRAMUSCULAR | Status: DC | PRN
  Administered 2017-02-01 – 2017-02-05 (×6): 25 ug via INTRAVENOUS
  Filled 2017-02-01 (×4): qty 2

## 2017-02-01 MED ORDER — GABAPENTIN 300 MG PO CAPS
300.0000 mg | ORAL_CAPSULE | Freq: Three times a day (TID) | ORAL | Status: DC
  Administered 2017-02-01 – 2017-02-05 (×13): 300 mg via ORAL
  Filled 2017-02-01 (×14): qty 1

## 2017-02-01 MED ORDER — FENTANYL CITRATE (PF) 100 MCG/2ML IJ SOLN
INTRAMUSCULAR | Status: AC
  Filled 2017-02-01: qty 2

## 2017-02-01 MED ORDER — ACETAMINOPHEN 650 MG RE SUPP
975.0000 mg | Freq: Once | RECTAL | Status: AC
  Administered 2017-02-01: 975 mg via RECTAL
  Filled 2017-02-01: qty 1

## 2017-02-01 MED ORDER — DILTIAZEM HCL 100 MG IV SOLR
5.0000 mg/h | INTRAVENOUS | Status: DC
  Filled 2017-02-01: qty 100

## 2017-02-01 MED ORDER — PRAMIPEXOLE DIHYDROCHLORIDE 0.25 MG PO TABS
0.2500 mg | ORAL_TABLET | Freq: Every day | ORAL | Status: DC
  Administered 2017-02-02 – 2017-02-11 (×10): 0.25 mg via ORAL
  Filled 2017-02-01 (×12): qty 1

## 2017-02-01 MED ORDER — LEVALBUTEROL HCL 0.63 MG/3ML IN NEBU
0.6300 mg | INHALATION_SOLUTION | Freq: Three times a day (TID) | RESPIRATORY_TRACT | Status: DC
  Administered 2017-02-01 (×2): 0.63 mg via RESPIRATORY_TRACT
  Filled 2017-02-01 (×2): qty 3

## 2017-02-01 MED ORDER — AMIODARONE HCL IN DEXTROSE 360-4.14 MG/200ML-% IV SOLN
60.0000 mg/h | INTRAVENOUS | Status: AC
  Administered 2017-02-01: 60 mg/h via INTRAVENOUS
  Filled 2017-02-01 (×2): qty 200

## 2017-02-01 MED ORDER — POLYETHYLENE GLYCOL 3350 17 G PO PACK
17.0000 g | PACK | Freq: Every day | ORAL | Status: DC
  Administered 2017-02-03 – 2017-02-12 (×10): 17 g via ORAL
  Filled 2017-02-01 (×12): qty 1

## 2017-02-01 MED ORDER — FENTANYL CITRATE (PF) 100 MCG/2ML IJ SOLN
50.0000 ug | Freq: Once | INTRAMUSCULAR | Status: AC
  Administered 2017-02-01: 50 ug via INTRAVENOUS

## 2017-02-01 MED ORDER — MOMETASONE FURO-FORMOTEROL FUM 200-5 MCG/ACT IN AERO
2.0000 | INHALATION_SPRAY | Freq: Two times a day (BID) | RESPIRATORY_TRACT | Status: DC
  Administered 2017-02-01 – 2017-02-02 (×2): 2 via RESPIRATORY_TRACT
  Filled 2017-02-01: qty 8.8

## 2017-02-01 MED ORDER — VITAMIN D 1000 UNITS PO TABS
2000.0000 [IU] | ORAL_TABLET | Freq: Every day | ORAL | Status: DC
  Administered 2017-02-01 – 2017-02-12 (×11): 2000 [IU] via ORAL
  Filled 2017-02-01 (×15): qty 2

## 2017-02-01 MED ORDER — TAMSULOSIN HCL 0.4 MG PO CAPS
0.4000 mg | ORAL_CAPSULE | Freq: Every day | ORAL | Status: DC
  Administered 2017-02-01 – 2017-02-11 (×9): 0.4 mg via ORAL
  Filled 2017-02-01 (×9): qty 1

## 2017-02-01 MED ORDER — AMIODARONE HCL IN DEXTROSE 360-4.14 MG/200ML-% IV SOLN
30.0000 mg/h | INTRAVENOUS | Status: DC
  Administered 2017-02-01 – 2017-02-03 (×4): 30 mg/h via INTRAVENOUS
  Filled 2017-02-01 (×3): qty 200

## 2017-02-01 NOTE — ED Notes (Signed)
[facility] and [facility] did not have any step down beds - contacted Critical Care per Dr. Anitra LauthPlunkett

## 2017-02-01 NOTE — ED Notes (Signed)
Will medicate pt for pain prior to transport.

## 2017-02-01 NOTE — Consult Note (Signed)
Cardiology Consultation:   Patient ID: Neil Terry; [id number]; 1927-07-22   Admit date: 02/26/2017 Date of Consult: 02/01/2017  Primary Care Provider: Patient, No Pcp Per Primary Cardiologist: [place] Primary Electrophysiologist:  n/a   Patient Profile:   Neil Terry is a 83 y.o. male with a hx of atrial fibrillation who is being seen today for the evaluation of atrial fibrillation w RVR at the request of Dr. Willette Pa.  History of Present Illness:   [name] has a history of paroxysmal atrial fibrillation, diagnosed in 2016 when he was admitted with respiratory failure due to pneumonia in the setting of acute cholecystitis.  He has no significant structural heart disease with normal left ventricular systolic function, moderately dilated left atrium and low risk nuclear stress testing performed in September 2016.  He was previously treated with oral anticoagulant, but this was stopped due to GI bleeding problemsand he is now on aspirin .  He smoked 3 packs a day for 50 years and has COPD, chronic respiratory failure on home oxygen.  He is currently admitted with severe multiple pelvic fractures complicated by retroperitoneal hematoma, consequence of a fall.  He has had repeated episodes of paroxysmal atrial fibrillation with rapid ventricular response.  Brief bursts of wide-complex rhythm (3-4 beats) have occurred on the background of atrial fibrillation, most likely aberrant conduction rather than nonsustained VT.  He complains of dyspnea and feels that his "lungs are full of water".  Oxygen requirements have been high overnight.  He does not have chest pain.  He denies syncope.  His fall was down the stairs after misjudging the steps.  He is aware of palpitations.  His son-in-law serves as Nurse, learning disability.  The patient only speaks Arabic.  Past Medical History:  Diagnosis Date  . BPH (benign prostatic hypertrophy)   . Bronchial asthma   . COPD (chronic obstructive pulmonary disease) (HCC)    . Depression    hx  . Duodenal ulcer    perforated ~ 2013  . GERD (gastroesophageal reflux disease)   . HTN (hypertension)   . On home oxygen therapy    "8h/day; don't know how many liters"    Past Surgical History:  Procedure Laterality Date  . ABDOMINAL EXPLORATION SURGERY  02/23/2012  . CATARACT EXTRACTION, BILATERAL    . FRACTURE SURGERY    . GASTRIC ROUX-EN-Y  ~ 1226/2013   closure of duodenal stump w/Roux-en-Y gastro jejunalanastomosis  . HIP FRACTURE SURGERY Right 2000's  . HIP HARDWARE REMOVAL Right ~ 2011  . PARTIAL GASTRECTOMY  2013-2014   distal; w/closure of duodenal stump; gastro jejunalanastomosis  . TRANSURETHRAL RESECTION OF PROSTATE  2016     Home Medications:  Prior to Admission medications   Medication Sig Start Date End Date Taking? Authorizing Provider  acetaminophen (TYLENOL) 500 MG tablet Take 500 mg by mouth 2 (two) times daily.   Yes [provider]  amoxicillin-clavulanate (AUGMENTIN) 500-125 MG tablet Take 1 tablet by mouth 2 (two) times daily.   Yes [provider]  aspirin 81 MG chewable tablet Chew by mouth daily.   Yes [provider]  budesonide-formoterol (SYMBICORT) 160-4.5 MCG/ACT inhaler Inhale 2 puffs into the lungs 2 (two) times daily.   Yes [provider]  busPIRone (BUSPAR) 7.5 MG tablet Take 7.5 mg by mouth at bedtime as needed.   Yes [provider]  Cholecalciferol 1000 units tablet Take 2,000 Units by mouth daily.   Yes [provider]  doxycycline (VIBRAMYCIN) 100 MG capsule Take 100  mg by mouth 2 (two) times daily.   Yes [provider]  furosemide (LASIX) 20 MG tablet Take 20 mg by mouth.   Yes [provider]  gabapentin (NEURONTIN) 300 MG capsule Take 300 mg by mouth 3 (three) times daily.   Yes [provider]  guaiFENesin (MUCINEX) 600 MG 12 hr tablet Take by mouth 2 (two) times daily.   Yes [provider]  ipratropium-albuterol (DUONEB)  0.5-2.5 (3) MG/3ML SOLN Take 3 mLs by nebulization.   Yes [provider]  metoprolol tartrate (LOPRESSOR) 25 MG tablet Take 25 mg by mouth once.   Yes [provider]  potassium chloride (K-DUR) 10 MEQ tablet Take 10 mEq by mouth daily.   Yes [provider]  pramipexole (MIRAPEX) 0.25 MG tablet Take 0.25 mg by mouth at bedtime.   Yes [provider]  senna-docusate (SENOKOT-S) 8.6-50 MG tablet Take 1 tablet by mouth daily.   Yes [provider]  sertraline (ZOLOFT) 50 MG tablet Take 50 mg by mouth at bedtime.   Yes [provider]  tamsulosin (FLOMAX) 0.4 MG CAPS capsule Take 0.4 mg by mouth.   Yes [provider]  traMADol (ULTRAM) 50 MG tablet Take by mouth every 6 (six) hours as needed.   Yes [provider]  albuterol (PROVENTIL HFA;VENTOLIN HFA) 108 (90 Base) MCG/ACT inhaler Inhale 2 puffs into the lungs every 6 (six) hours as needed for wheezing or shortness of breath. 04/09/16   Abelino Derrick, PA-C  esomeprazole (NEXIUM) 20 MG capsule Take 20 mg by mouth daily at 12 noon.    [provider]  polyethylene glycol (MIRALAX / GLYCOLAX) packet Take 17 g by mouth daily. 01/31/16   Richarda Overlie, MD    Inpatient Medications: Scheduled Meds: . acetaminophen  500 mg Oral BID  . cholecalciferol  2,000 Units Oral Daily  . diltiazem  10 mg Intravenous Once  . furosemide  20 mg Oral Daily  . gabapentin  300 mg Oral TID  . ipratropium  0.5 mg Nebulization Q6H  . levalbuterol  0.63 mg Nebulization Q8H  . mometasone-formoterol  2 puff Inhalation BID  . pantoprazole  40 mg Oral Daily  . polyethylene glycol  17 g Oral Daily  . potassium chloride  10 mEq Oral Daily  . pramipexole  0.25 mg Oral QHS  . senna-docusate  1 tablet Oral Daily  . sertraline  50 mg Oral QHS  . tamsulosin  0.4 mg Oral QPC supper   Continuous Infusions: . sodium chloride 20 mL/hr at 02/01/17 1024  . amiodarone 60 mg/hr (02/01/17 1039)    Followed by  . amiodarone    . diltiazem (CARDIZEM) infusion     PRN Meds: busPIRone, diltiazem, fentaNYL (SUBLIMAZE) injection, morphine injection, traMADol  Allergies:   No Known Allergies  Social History:   Social History   Socioeconomic History  . Marital status: Widowed    Spouse name: Not on file  . Number of children: Not on file  . Years of education: Not on file  . Highest education level: Not on file  Social Needs  . Financial resource strain: Not on file  . Food insecurity - worry: Not on file  . Food insecurity - inability: Not on file  . Transportation needs - medical: Not on file  . Transportation needs - non-medical: Not on file  Occupational History  . Not on file  Tobacco Use  . Smoking status: Former Smoker    Packs/day: 3.00  Years: 50.00    Pack years: 150.00    Types: Cigarettes  . Smokeless tobacco: Never Used  . Tobacco comment: "quit smoking cigarettes in the 1990's"  Substance and Sexual Activity  . Alcohol use: No    Alcohol/week: 0.0 oz  . Drug use: No  . Sexual activity: Not on file  Other Topics Concern  . Not on file  Social History Narrative   Lives with several sons between here and [place] .  Travels between sons's homes    Family History:    Family History  Problem Relation Age of Onset  . Arrhythmia Son   . Stroke Brother   . Other Mother        Died of old age  . Asthma Father      ROS:  Please see the history of present illness.  ROS  All other ROS reviewed and negative.     Physical Exam/Data:   Vitals:   02/01/17 0900 02/01/17 0915 02/01/17 0933 02/01/17 1000  BP: 105/60 (!) 98/59 (!) 98/59 (!) 98/55  Pulse: 75 66 73 94  Resp: (!) 28 (!) 26 (!) 25 (!) 28  Temp:      TempSrc:      SpO2: 96% 95% 95% 95%  Weight:      Height:        Intake/Output Summary (Last 24 hours) at 02/01/2017 1106 Last data filed at 02/01/2017 1000 Gross per 24 hour  Intake 150 ml  Output -  Net 150 ml   Filed Weights    September 21, 2016 2020  Weight: 162 lb (73.5 kg)   Body mass index is 31.12 kg/m.  General:  Well nourished, well developed, in mild distress HEENT: normal Lymph: no adenopathy Neck: no JVD Endocrine:  No thryomegaly Vascular: No carotid bruits; FA pulses 2+ bilaterally without bruits  Cardiac:  normal S1, S2; irregular; no murmur  Lungs:  clear to auscultation bilaterally, no wheezing, rhonchi or rales  Abd: soft, nontender, no hepatomegaly  Ext: no edema Musculoskeletal:  No deformities, BUE and BLE strength normal and equal Skin: warm and dry  Neuro:  CNs 2-12 intact, no focal abnormalities noted Psych:  Normal affect   EKG:  The EKG was personally reviewed and demonstrates: Sinus rhythm with a couple of PVCs, otherwise normal, no ischemic changes  Telemetry:  Telemetry was personally reviewed and demonstrates several episodes of paroxysmal atrial fibrillation, alternating with normal sinus rhythm. Currently atrial fibrillation with borderline rate control in the 90s.  Several bursts of wide-complex tachycardia, consisting of no more than 4 beats, have occurred during the episodes of A. fib with RVR.  They may represent nonsustained VT, but are likely periods of aberrancy.  Relevant CV Studies: Echo January 28, 2016 Study Conclusions  - Left ventricle: The cavity size was normal. Wall thickness was   normal. Systolic function was normal. The estimated ejection   fraction was in the range of 60% to 65%. - Mitral valve: Calcified annulus. There was mild regurgitation. - Left atrium: The atrium was moderately dilated. - Atrial septum: There was increased thickness of the septum,   consistent with lipomatous hypertrophy. No defect or patent   foramen ovale was identified.  Nuclear stress test November 12, 2014 IMPRESSION: 1. Single small reversible defect noted within the anterior apex.  2. Normal left ventricular wall motion.  3. Left ventricular ejection fraction 68%  4.  Low-risk stress test findings*.   Laboratory Data:  Chemistry Recent Labs  Lab September 21, 2016 2134  NA 133*  K 4.4  CL 95*  CO2 34*  GLUCOSE 105*  BUN 17  CREATININE 0.82  CALCIUM 8.5*  GFRNONAA >60  GFRAA >60  ANIONGAP 4*    No results for input(s): PROT, ALBUMIN, AST, ALT, ALKPHOS, BILITOT in the last 168 hours. Hematology Recent Labs  Lab 02/21/2017 2134 02/01/17 0144  WBC 15.9* 15.7*  RBC 4.52 4.62  HGB 11.9* 12.2*  HCT 38.9* 39.7  MCV 86.1 85.9  MCH 26.3 26.4  MCHC 30.6 30.7  RDW 14.4 14.3  PLT 190 159   Cardiac Enzymes Recent Labs  Lab 02/01/17 0144  TROPONINI <0.03   No results for input(s): TROPIPOC in the last 168 hours.  BNPNo results for input(s): BNP, PROBNP in the last 168 hours.  DDimer No results for input(s): DDIMER in the last 168 hours.  Radiology/Studies:  Ct Pelvis Wo Contrast  Result Date: 02/18/2017 CLINICAL DATA:  Fall today with right hip pain. Acetabular fracture on radiograph. EXAM: CT PELVIS WITHOUT CONTRAST TECHNIQUE: Multidetector CT imaging of the pelvis was performed following the standard protocol without intravenous contrast. COMPARISON:  Radiographs earlier this day. FINDINGS: Urinary Tract: Irregular bladder wall thickening. Two stones in the right urinary trigone. Bowel: Lower ventral abdominal wall hernia containing small bowel. Mild fecalization of bowel contents in the hernia, no bowel wall thickening or free fluid. No evidence of obstruction. Vascular/Lymphatic: Aorto bi-iliac atherosclerosis. Small bilateral external iliac nodes. Reproductive:  Prostatic calcifications. Other: Right extraperitoneal stranding consistent secondary to pelvic fractures. Musculoskeletal: Comminuted right pelvic fracture. Comminuted displaced fracture involving the iliac wing extending to the anterior aspect of the sacroiliac joint. Equivocal minimal widening of the lower right sacroiliac joint. Distal fracture extent involves the acetabulum with minimal  articular surface displacement. Fracture involves the anterior wall of the acetabulum at the puboacetabular junction. Nondisplaced fracture of the right inferior pubic ramus. Intramedullary rod in the right proximal femur with healed posttraumatic proximal femoral fracture. Intramedullary rod with trans trochanteric screws traverse the left proximal femur and remote intertrochanteric femur fracture. No additional left-sided acute pelvic fracture. Pubic symphysis is congruent with mild chondrocalcinosis. Advanced bony under mineralization. IMPRESSION: 1. Complex right pelvic fracture. Comminuted displaced fracture involves the right iliac wing extending from the anterior aspect of the sacroiliac joint to the anterior acetabulum at the puboacetabular junction. Minimal widening of the lower SI joint. Minimal articular up off of the acetabulum. There is nondisplaced right inferior pubic ramus fracture. 2. Associated extraperitoneal hemorrhage associated with pelvic fractures. 3. Bilateral proximal femoral hardware without periprosthetic fracture. 4. Irregular bladder wall thickening with small bladder stones. Recommend correlation with urinalysis. Bladder wall irregularity was seen on abdominal CT 01/25/2016 and appears chronic. 5.  Aortic Atherosclerosis (ICD10-I70.0). Electronically Signed   By: Rubye Oaks M.D.   On: 02/07/2017 23:02   Portable Chest X-ray 1 View  Result Date: 02/01/2017 CLINICAL DATA:  Hypoxemia EXAM: PORTABLE CHEST 1 VIEW COMPARISON:  02/05/2017 FINDINGS: 0845 hours. The cardio pericardial silhouette is enlarged. The lungs are clear without focal pneumonia, edema, pneumothorax or pleural effusion. Interstitial markings are diffusely coarsened with chronic features. Bones are diffusely demineralized. Telemetry leads overlie the chest. IMPRESSION: Cardiomegaly with underlying chronic interstitial lung disease. Electronically Signed   By: Kennith Center M.D.   On: 02/01/2017 09:08   Dg Chest  Port 1 View  Result Date: 02/01/2017 CLINICAL DATA:  Respiratory difficulty EXAM: PORTABLE CHEST 1 VIEW COMPARISON:  01/25/2016 FINDINGS: Diffuse interstitial opacity. No pleural effusion. Mild cardiomegaly with aortic atherosclerosis.  No definite pneumothorax. IMPRESSION: 1. Diffuse interstitial opacity could reflect mild interstitial edema on underlying chronic change. No focal pulmonary infiltrate 2. Mild cardiomegaly Electronically Signed   By: Jasmine PangKim  Fujinaga M.D.   On: 02/01/2017 00:13   Dg Knee Complete 4 Views Right  Result Date: 02/21/2017 CLINICAL DATA:  Fall on concrete today.  Right hip and knee pain. EXAM: RIGHT KNEE - COMPLETE 4+ VIEW COMPARISON:  Femur radiograph 03/29/2014 FINDINGS: Intramedullary nail distal locking screw in the distal femur, partially included. There is diffuse medially oriented with rod approaching the medial cortex and associated chronic cortical thickening. No acute fracture of the knee. The alignment is maintained. Mild medial tibiofemoral joint space narrowing. No knee joint effusion. Advanced bony under mineralization. IMPRESSION: 1. No acute fracture of the right knee. 2. Intramedullary rod in the distal femur is partially included, appears medially oriented with chronic cortical thickening. Cortical thickening is likely new from prior exam, and could predispose to hardware failure. Electronically Signed   By: Rubye OaksMelanie  Ehinger M.D.   On: 02/21/2017 22:42   Dg Hip Unilat W Or Wo Pelvis 2-3 Views Right  Result Date: 02/26/2017 CLINICAL DATA:  Acute right hip pain following fall today. Initial encounter. EXAM: DG HIP (WITH OR WITHOUT PELVIS) 2-3V RIGHT COMPARISON:  11/07/2014 radiographs FINDINGS: There is a fracture of the right acetabulum and right iliopubic ring. No dislocation identified. An intramedullary rod within the right femur are is again noted as well as a remote proximal right femur fracture. Left femur intramedullary rod and screw and remote proximal left  femur fracture noted. IMPRESSION: Right acetabular and right iliopubic ring fracture. Consider CT for further evaluation. Electronically Signed   By: Harmon PierJeffrey  Hu M.D.   On: 02/14/2017 21:32    Assessment and Plan:   1. AFib w RVR: He cannot be anticoagulated due to retroperitoneal bleeding.  Blood pressure is borderline low and I think we will be best served with a rhythm control approach with intravenous amiodarone, rather than AV nodal blocking agents which will further drop his blood pressure.  When he is able to take oral medications can switch to oral amiodarone, preferably after a 24-hour IV load. 2. Acute on chronic respiratory failure: He already started with a very tenuous pulmonary situation, potential to be complicated by pulmonary embolism with fat or thrombus, heart failure from poorly controlled arrhythmia or excessive IV fluid administration. 3. CAD: Chest CT in 2017 did show atherosclerotic calcifications in the coronary arteries and he had a very small ischemic defect on his nuclear stress test in 2017.  He has never complained of angina pectoris and does not have any chest pain currently.  He probably has underlying coronary disease, but currently not symptomatic.  No evidence of ischemia on ECG.  Normal cardiac enzymes.   For questions or updates, please contact [facility] Please consult [email or web address] for contact info under Cardiology/STEMI.   Signed, Thurmon FairMihai Merik Mignano, MD  02/01/2017 11:06 AM

## 2017-02-01 NOTE — ED Notes (Signed)
[facility] here to transport pt to [facility]  

## 2017-02-01 NOTE — ED Notes (Signed)
Pt resting quietly. Eyes closed. Rise and fall of chest noted, though breathing is somewhat shallow and labored. Pt on 12L FI 02 55%. O2sat 93% on same. BBS coarse rales and rhonchi noted. Pt opens eyes when spoken to, answers questions (FM interpreting) and follows commands. Skin is warm to touch. Dpp palp. Moves toes Feels touch. Cap refill < 3 sec. Monitor currently showing NSR.

## 2017-02-01 NOTE — Care Management Note (Addendum)
Case Management Note  Patient Details  Name: Neil Terry MRN: [id number] Date of Birth: 1927-12-24  Subjective/Objective:  83 y.o. M suffered a fall resulting in Rt Acetabular Fx and Closed Pelvic Fx. Pt will be NWB to RLE and plan is for non- operative management of these issues.  Landed in the ICU as a result of respiratory issues requiring Venti mask.  Lives with family               Action/Plan: CM will continue to follow for Discharge/Disposition needs.   Expected Discharge Date:                  Expected Discharge Plan:     In-House Referral:     Discharge planning Services  CM Consult  Post Acute Care Choice:    Choice offered to:     DME Arranged:    DME Agency:     HH Arranged:    HH Agency:     Status of Service:  In process, will continue to follow  If discussed at Long Length of Stay Meetings, dates discussed:    Additional Comments:  Yvone NeuCrutchfield, Jaydin Jalomo M, RN 02/01/2017, 10:10 AM

## 2017-02-01 NOTE — ED Notes (Signed)
Pt took venti face off, I placed it back on face and informed RN [place]ennaya.

## 2017-02-01 NOTE — ED Notes (Signed)
Pt's family at bedside stating that his pain has increased. Rates current pain 9/10. MD aware. Pt also requesting water. Will start with ice chips per MD.

## 2017-02-01 NOTE — ED Notes (Signed)
Report given to Sharyn LullBarbara J. RN, with [facility]

## 2017-02-01 NOTE — ED Provider Notes (Addendum)
Patient starting to decompensate.  Heart rate increases to 142 with irregularity and intermittent runs of nonsustained V. tach.  Also febrile to 100.6 rectally continues to be tachypneic.  Patient does appear to be in significant pain and heart rate did improve with fentanyl however there are feel patient would and if it from being in the ICU for closer monitoring.  Patient did have a leukocytosis which may be related to his fall however it could be developing infection given his worsening respiratory status.  Patient is currently on a Ventimask at 12 L/min.  We will get a lactic acid, repeat CBC, EKG and troponin.  Will discuss with critical care.  2:16 AM At this time after fentanyl pt's VS stabilized and O2 sats improved as well as heart rate.  Discussed with critical care but at this time feel he could go to ICU bed as a step down overflow but still go to the hospitalist service and be upgraded if he becomes unstable.  4:03 AM Labs are reassuring and with pain control pt remains stable.  Most recent check was HR <100, BP 112/74 and resp rate 22 and o2 sat 94% on venti mask.  Temp did go up to 101.2 however no source at this time.   Gwyneth SproutPlunkett, Barri Neidlinger, MD 02/01/17 [phone]    Gwyneth SproutPlunkett, Zakhari Fogel, MD 02/01/17 [phone]

## 2017-02-01 NOTE — Plan of Care (Signed)
83 yo M, h/o COPD.  Ground level fall.  Broke pelvis.  Charlann BoxerOlin will see patient here at [facility].  Also having hypoxia,  CXR results pending.  Probably going to go to inpatient SDU.

## 2017-02-01 NOTE — H&P (Addendum)
History and Physical    Neil Terry ONG:[id number] DOB: February 18, 1928 DOA: 02/13/2017  PCP: Patient, No Pcp Per  Patient coming from: Home  I have personally briefly reviewed patient's [facility]  Chief Complaint: Larey SeatFell coming down the steps late last night, now with right hip pain.  HPI: Neil MinksFarid Parlee is a 83 y.o. male with medical history significant for GI bleed, benign prostatic hypertrophy, COPD with chronic respiratory failure on home oxygen, gastroesophageal reflux disease, abdominal hernias with a bowel obstruction in November 2017 treated at our facility conservatively who presents [facility] department [facility] complaining of right hip pain after a fall while coming down the steps.  The entirety of the history is obtained from the patient's son-in-law and the medical record.  Patient's son-in-law states that the patient usually walks with a walker but was using a cane to go down the steps.  He was at the last of 3 steps when he misjudged the distance from the step to the floor tripped and fell.  He sustained immediate pain to his right hip and was complaining of that.  X-rays in the emergency department showed right acetabular and right iliopubic ring fracture.  He was referred to us for admission but during the process of obtaining a blood the patient started to decompensate.     ED Course: His heart rate and went into the 140s with rapid ventricular response and he had intermittent runs of nonsustained ventricular tachycardia.  His temperature went up to 100.6 rectally and he continued to be tachypneic.  Pain improved with some fentanyl IV case was discussed by the critical care who felt the patient would be able to be admitted to a stepdown bed on the hospitalist service.  His fever did go up to 101.2.  By 1 AM he was requiring 12 L of oxygen but was more comfortable.  Troponin was negative and lactic acid was 0.73.  Chest x-ray did not  show infiltrates.  After consultation with critical care medicine the decision was made to admit the patient to a stepdown unit.  Most recent echocardiogram in November 2017:- Left ventricle: The cavity size was normal. Wall thickness was   normal. Systolic function was normal. The estimated ejection fraction was in the range of 60% to 65%.- Mitral valve: Calcified annulus. There was mild regurgitation.- Left atrium: The atrium was moderately dilated.- Atrial septum: There was increased thickness of the septum,   consistent with lipomatous hypertrophy. No defect or patent foramen ovale was identified.  Review of Systems: Review of systems unable to be obtained due to patient medical status   Past Medical History:  Diagnosis Date  . BPH (benign prostatic hypertrophy)   . Bronchial asthma   . COPD (chronic obstructive pulmonary disease) (HCC)   . Depression    hx  . Duodenal ulcer    perforated ~ 2013  . GERD (gastroesophageal reflux disease)   . HTN (hypertension)   . On home oxygen therapy    "8h/day; don't know how many liters"    Past Surgical History:  Procedure Laterality Date  . ABDOMINAL EXPLORATION SURGERY  02/23/2012  . CATARACT EXTRACTION, BILATERAL    . FRACTURE SURGERY    . GASTRIC ROUX-EN-Y  ~ 1226/2013   closure of duodenal stump w/Roux-en-Y gastro jejunalanastomosis  . HIP FRACTURE SURGERY Right 2000's  . HIP HARDWARE REMOVAL Right ~ 2011  . PARTIAL GASTRECTOMY  2013-2014   distal; w/closure of duodenal  stump; gastro jejunalanastomosis  . TRANSURETHRAL RESECTION OF PROSTATE  2016     reports that he has quit smoking. His smoking use included cigarettes. He has a 150.00 pack-year smoking history. he has never used smokeless tobacco. He reports that he does not drink alcohol or use drugs.  No Known Allergies  Family History  Problem Relation Age of Onset  . Arrhythmia Son   . Stroke Brother   . Other Mother        Died of old age  . Asthma Father       Prior to Admission medications   Medication Sig Start Date End Date Taking? Authorizing Provider  acetaminophen (TYLENOL) 500 MG tablet Take 500 mg by mouth 2 (two) times daily.   Yes [provider]  amoxicillin-clavulanate (AUGMENTIN) 500-125 MG tablet Take 1 tablet by mouth 2 (two) times daily.   Yes [provider]  aspirin 81 MG chewable tablet Chew by mouth daily.   Yes [provider]  budesonide-formoterol (SYMBICORT) 160-4.5 MCG/ACT inhaler Inhale 2 puffs into the lungs 2 (two) times daily.   Yes [provider]  busPIRone (BUSPAR) 7.5 MG tablet Take 7.5 mg by mouth at bedtime as needed.   Yes [provider]  Cholecalciferol 1000 units tablet Take 2,000 Units by mouth daily.   Yes [provider]  doxycycline (VIBRAMYCIN) 100 MG capsule Take 100 mg by mouth 2 (two) times daily.   Yes [provider]  furosemide (LASIX) 20 MG tablet Take 20 mg by mouth.   Yes [provider]  gabapentin (NEURONTIN) 300 MG capsule Take 300 mg by mouth 3 (three) times daily.   Yes [provider]  guaiFENesin (MUCINEX) 600 MG 12 hr tablet Take by mouth 2 (two) times daily.   Yes [provider]  ipratropium-albuterol (DUONEB) 0.5-2.5 (3) MG/3ML SOLN Take 3 mLs by nebulization.   Yes [provider]  metoprolol tartrate (LOPRESSOR) 25 MG tablet Take 25 mg by mouth once.   Yes [provider]  potassium chloride (K-DUR) 10 MEQ tablet Take 10 mEq by mouth daily.   Yes [provider]  pramipexole (MIRAPEX) 0.25 MG tablet Take 0.25 mg by mouth at bedtime.   Yes [provider]  senna-docusate (SENOKOT-S) 8.6-50 MG tablet Take 1 tablet by mouth daily.   Yes [provider]  sertraline (ZOLOFT) 50 MG tablet Take 50 mg by mouth at bedtime.   Yes [provider]  tamsulosin (FLOMAX) 0.4 MG CAPS capsule Take 0.4 mg by mouth.   Yes [provider]  traMADol  (ULTRAM) 50 MG tablet Take by mouth every 6 (six) hours as needed.   Yes [provider]  albuterol (PROVENTIL HFA;VENTOLIN HFA) 108 (90 Base) MCG/ACT inhaler Inhale 2 puffs into the lungs every 6 (six) hours as needed for wheezing or shortness of breath. 04/09/16   Abelino Derrick, PA-C  esomeprazole (NEXIUM) 20 MG capsule Take 20 mg by mouth daily at 12 noon.    [provider]  polyethylene glycol (MIRALAX / GLYCOLAX) packet Take 17 g by mouth daily. 01/31/16   Richarda Overlie, MD    Physical Exam: Vitals:   02/01/17 0845 02/01/17 0900 02/01/17 0915 02/01/17 0933  BP: (!) 99/54 105/60 (!) 98/59 (!) 98/59  Pulse: 63 75 66 73  Resp: 19 (!) 28 (!) 26 (!) 25  Temp:      TempSrc:      SpO2: 97% 96% 95% 95%  Weight:  Height:        Constitutional: NAD, calm, comfortable Vitals:   02/01/17 0845 02/01/17 0900 02/01/17 0915 02/01/17 0933  BP: (!) 99/54 105/60 (!) 98/59 (!) 98/59  Pulse: 63 75 66 73  Resp: 19 (!) 28 (!) 26 (!) 25  Temp:      TempSrc:      SpO2: 97% 96% 95% 95%  Weight:      Height:       Eyes: PERRL, lids and conjunctivae normal ENMT: Mucous membranes are moist. Posterior pharynx clear of any exudate or lesions.Normal dentition.  Neck: normal, supple, no masses, no thyromegaly Respiratory: Increased worth of beer breathing with accessory muscle use and coarse breath sounds bilaterally minimal crackles at the base Cardiovascular: Irregularly irregular and tachycardic, no murmurs / rubs / gallops. No extremity edema. 2+ pedal pulses. No carotid bruits.  Abdomen: no tenderness, no masses palpated. No hepatosplenomegaly. Bowel sounds positive.  Musculoskeletal: no clubbing / cyanosis. joint deformity with externally rotated and shortened right lower extremity. , no contractures. Normal muscle tone.  Skin: Mottling of the right lower extremity, lesions, ulcers. No induration Neurologic: CN 2-12 grossly intact. Sensation intact, DTR normal. Strength 5/5 in  all 4.    Labs on Admission: I have personally reviewed following labs and imaging studies  CBC: Recent Labs  Lab 02/05/2017 2134 02/01/17 0144  WBC 15.9* 15.7*  NEUTROABS 14.2*  --   HGB 11.9* 12.2*  HCT 38.9* 39.7  MCV 86.1 85.9  PLT 190 159   Basic Metabolic Panel: Recent Labs  Lab 02/18/2017 2134  NA 133*  K 4.4  CL 95*  CO2 34*  GLUCOSE 105*  BUN 17  CREATININE 0.82  CALCIUM 8.5*   GFR: Estimated Creatinine Clearance: 54 mL/min (by C-G formula based on SCr of 0.82 mg/dL). Liver Function Tests: No results for input(s): AST, ALT, ALKPHOS, BILITOT, PROT, ALBUMIN in the last 168 hours. No results for input(s): LIPASE, AMYLASE in the last 168 hours. No results for input(s): AMMONIA in the last 168 hours. Coagulation Profile: Recent Labs  Lab 02/27/2017 2334  INR 1.17   Cardiac Enzymes: Recent Labs  Lab 02/01/17 0144  TROPONINI <0.03   BNP (last 3 results) No results for input(s): PROBNP in the last 8760 hours. HbA1C: No results for input(s): HGBA1C in the last 72 hours. CBG: No results for input(s): GLUCAP in the last 168 hours. Lipid Profile: No results for input(s): CHOL, HDL, LDLCALC, TRIG, CHOLHDL, LDLDIRECT in the last 72 hours. Thyroid Function Tests: No results for input(s): TSH, T4TOTAL, FREET4, T3FREE, THYROIDAB in the last 72 hours. Anemia Panel: No results for input(s): VITAMINB12, FOLATE, FERRITIN, TIBC, IRON, RETICCTPCT in the last 72 hours. Urine analysis:    Component Value Date/Time   COLORURINE AMBER (A) 11/08/2014 1608   APPEARANCEUR CLOUDY (A) 11/08/2014 1608   LABSPEC 1.026 11/08/2014 1608   PHURINE 5.0 11/08/2014 1608   GLUCOSEU NEGATIVE 11/08/2014 1608   HGBUR LARGE (A) 11/08/2014 1608   BILIRUBINUR SMALL (A) 11/08/2014 1608   KETONESUR NEGATIVE 11/08/2014 1608   PROTEINUR 100 (A) 11/08/2014 1608   UROBILINOGEN 1.0 11/08/2014 1608   NITRITE NEGATIVE 11/08/2014 1608   LEUKOCYTESUR SMALL (A) 11/08/2014 1608    Radiological  Exams on Admission: Ct Pelvis Wo Contrast  Result Date: 03/02/2017 CLINICAL DATA:  Fall today with right hip pain. Acetabular fracture on radiograph. EXAM: CT PELVIS WITHOUT CONTRAST TECHNIQUE: Multidetector CT imaging of the pelvis was performed following the standard protocol without intravenous contrast. COMPARISON:  Radiographs earlier this day. FINDINGS: Urinary Tract: Irregular bladder wall thickening. Two stones in the right urinary trigone. Bowel: Lower ventral abdominal wall hernia containing small bowel. Mild fecalization of bowel contents in the hernia, no bowel wall thickening or free fluid. No evidence of obstruction. Vascular/Lymphatic: Aorto bi-iliac atherosclerosis. Small bilateral external iliac nodes. Reproductive:  Prostatic calcifications. Other: Right extraperitoneal stranding consistent secondary to pelvic fractures. Musculoskeletal: Comminuted right pelvic fracture. Comminuted displaced fracture involving the iliac wing extending to the anterior aspect of the sacroiliac joint. Equivocal minimal widening of the lower right sacroiliac joint. Distal fracture extent involves the acetabulum with minimal articular surface displacement. Fracture involves the anterior wall of the acetabulum at the puboacetabular junction. Nondisplaced fracture of the right inferior pubic ramus. Intramedullary rod in the right proximal femur with healed posttraumatic proximal femoral fracture. Intramedullary rod with trans trochanteric screws traverse the left proximal femur and remote intertrochanteric femur fracture. No additional left-sided acute pelvic fracture. Pubic symphysis is congruent with mild chondrocalcinosis. Advanced bony under mineralization. IMPRESSION: 1. Complex right pelvic fracture. Comminuted displaced fracture involves the right iliac wing extending from the anterior aspect of the sacroiliac joint to the anterior acetabulum at the puboacetabular junction. Minimal widening of the lower SI  joint. Minimal articular up off of the acetabulum. There is nondisplaced right inferior pubic ramus fracture. 2. Associated extraperitoneal hemorrhage associated with pelvic fractures. 3. Bilateral proximal femoral hardware without periprosthetic fracture. 4. Irregular bladder wall thickening with small bladder stones. Recommend correlation with urinalysis. Bladder wall irregularity was seen on abdominal CT 01/25/2016 and appears chronic. 5.  Aortic Atherosclerosis (ICD10-I70.0). Electronically Signed   By: Rubye Oaks M.D.   On: 03/01/2017 23:02   Portable Chest X-ray 1 View  Result Date: 02/01/2017 CLINICAL DATA:  Hypoxemia EXAM: PORTABLE CHEST 1 VIEW COMPARISON:  02/21/2017 FINDINGS: 0845 hours. The cardio pericardial silhouette is enlarged. The lungs are clear without focal pneumonia, edema, pneumothorax or pleural effusion. Interstitial markings are diffusely coarsened with chronic features. Bones are diffusely demineralized. Telemetry leads overlie the chest. IMPRESSION: Cardiomegaly with underlying chronic interstitial lung disease. Electronically Signed   By: Kennith Center M.D.   On: 02/01/2017 09:08   Dg Chest Port 1 View  Result Date: 02/01/2017 CLINICAL DATA:  Respiratory difficulty EXAM: PORTABLE CHEST 1 VIEW COMPARISON:  01/25/2016 FINDINGS: Diffuse interstitial opacity. No pleural effusion. Mild cardiomegaly with aortic atherosclerosis. No definite pneumothorax. IMPRESSION: 1. Diffuse interstitial opacity could reflect mild interstitial edema on underlying chronic change. No focal pulmonary infiltrate 2. Mild cardiomegaly Electronically Signed   By: Jasmine Pang M.D.   On: 02/01/2017 00:13   Dg Knee Complete 4 Views Right  Result Date: 02/25/2017 CLINICAL DATA:  Fall on concrete today.  Right hip and knee pain. EXAM: RIGHT KNEE - COMPLETE 4+ VIEW COMPARISON:  Femur radiograph 03/29/2014 FINDINGS: Intramedullary nail distal locking screw in the distal femur, partially included. There  is diffuse medially oriented with rod approaching the medial cortex and associated chronic cortical thickening. No acute fracture of the knee. The alignment is maintained. Mild medial tibiofemoral joint space narrowing. No knee joint effusion. Advanced bony under mineralization. IMPRESSION: 1. No acute fracture of the right knee. 2. Intramedullary rod in the distal femur is partially included, appears medially oriented with chronic cortical thickening. Cortical thickening is likely new from prior exam, and could predispose to hardware failure. Electronically Signed   By: Rubye Oaks M.D.   On: 02/19/2017 22:42   Dg Hip Unilat W Or Wo Pelvis 2-3  Views Right  Result Date: 02/08/2017 CLINICAL DATA:  Acute right hip pain following fall today. Initial encounter. EXAM: DG HIP (WITH OR WITHOUT PELVIS) 2-3V RIGHT COMPARISON:  11/07/2014 radiographs FINDINGS: There is a fracture of the right acetabulum and right iliopubic ring. No dislocation identified. An intramedullary rod within the right femur are is again noted as well as a remote proximal right femur fracture. Left femur intramedullary rod and screw and remote proximal left femur fracture noted. IMPRESSION: Right acetabular and right iliopubic ring fracture. Consider CT for further evaluation. Electronically Signed   By: Harmon Pier M.D.   On: 02/28/2017 21:32    EKG: Independently reviewed. SR  with ventricular bigeminy  Assessment/Plan Principal Problem:   Acute on chronic respiratory failure with hypoxia (HCC) Active Problems:   Atrial fibrillation with RVR (HCC)   Traumatic retroperitoneal hemorrhage   COPD with acute exacerbation (HCC)   Pelvic fracture (HCC)   Hip fracture requiring operative repair, right, closed, initial encounter (HCC)   PAF (paroxysmal atrial fibrillation) (HCC)   BPH (benign prostatic hyperplasia)  1.  Acute on chronic respiratory failure with hypoxia: She is currently requiring high flow oxygen at 12 L a minute  with a 50% Ventimask.  He usually uses oxygen via tank at home at approximately 2 L/min.  This is significant acute change and will require further evaluation.  I am going to order an arterial blood gas.  A d-dimer to check for possible venous thromboembolic disease would not be helpful in the setting of a hip fracture.  If the patient has an increased AA gradient will consider a CTA of the chest.  Will likely require pulmonary consultation.  2.  Atrial fibrillation with rapid ventricular response: Patient has a history of Axis I atrial fibrillation.  On presentation to the emergency department at [facility] he was in a sinus rhythm.  Since that time his rhythm has deteriorated he is gone into atrial fibrillation with rapid ventricular response and has had several runs of V. tach.  I am checking electrolytes I am going to rule him out for myocardial infarction.  We will start a diltiazem drip and am considering cardiology consultation.  Concern is the etiology of his atrial fibrillation.  I am concerned that he may have a pulmonary embolism.  He has had a difficult fracture of pelvic bones and long bones.  This puts him at increased risk for pulmonary embolism.  Unfortunately we cannot anticoagulate the patient due to a retroperitoneal hemorrhage associated with his pelvic fractures.  3.  Traumatic retroperitoneal hemorrhage: We will check serial H&H's.  We will monitor closely for hemodynamic stability.  Unable to anticoagulate patient due to this retroperitoneal hemorrhage.  4.  COPD with acute exacerbation: Patient with atrial fibrillation with rapid ventricular response unable to treat with albuterol inhalers or nebulizers.  We will start him on Xopenex and continue ipratropium.  At this point I do not think he requires steroids.  Will consider pulmonary consultation.  5.  Pelvic fracture: Patient will require early mobilization once his hip fracture is repaired.  Will defer to orthopedic  surgery.  Dr. Constance Goltz discussed with ER team last evening.  He will see patient once she is more stable.  I have already discussed the case with orthopedics on call today.  I recommended orthopedic surgery not proceed with surgery as patient is too medically ill at the present time and he is medically unstable for surgery.  6.  Hip fracture requiring  operative repair of the right hip, closed: Patient will require operative repair once he is more medically stable.  Currently will prescribe bedrest and provide IV and oral narcotic pain relief.  Scheduled Tylenol for pain as well.  7.  Paroxysmal atrial fibrillation: Currently in atrial fibrillation with rapid ventricular response.  8.  Benign prostatic hyperplasia: Patient may benefit from Foley catheter this hospitalization.  If he seems to be having urinary retention and difficulty with bladder emptying will order Foley catheter.  For now we will check bladder scans.  Patient is acutely and critically ill with relatively low blood pressures, hemorrhagic illness, and requiring IV antiarrhythmics.  I have spent 45 minutes in critical care directly at the bedside.  Time in was 7:38 AM time out was 8:23 AM  DVT prophylaxis: SCDs Code Status: Full code Family Communication: Personally spoke with patient's son-in-law, Bonnetta Barryabanja, was present in the room during my evaluation Disposition Plan: Home versus skilled nursing facility pending social work evaluation likely in 5-7 days Consults called: Cardiology Dr Royann Shiversroitoru; Dr. Wallace CullensGray Pulmonary Admission status: Inpatient   Lahoma Crockerheresa C Rasha Ibe MD FACP [facility] Pager [phone] If 7PM-7AM, please contact night-coverage [email or web address] Password [email or web address]  02/01/2017, 9:54 AM

## 2017-02-01 NOTE — ED Notes (Signed)
Report given to Zollie Scalelivia, [facility]ity fundraiserN at [facility]

## 2017-02-01 NOTE — Consult Note (Signed)
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  Since admission he has developed increasing oxygen requirements and is developed atrial fibrillation with rapid ventricular response.  An amiodarone infusion was initiated this morning and he is already responded with a heart rate in the 80s during my examination.  He denies any increase in his usual cough or any subjective increase in dyspnea.  He is not having any sort of chest pain including including pleuritic chest pain.   PAST MEDICAL HISTORY :  He  has a past medical history of BPH (benign prostatic hypertrophy), Bronchial asthma, COPD (chronic obstructive pulmonary disease) (HCC), Depression, Duodenal ulcer, GERD (gastroesophageal reflux disease), HTN (hypertension), and On home oxygen therapy.  PAST SURGICAL HISTORY: He  has a past surgical history that includes Transurethral resection of prostate (2016); Partial gastrectomy (2013-2014); Fracture surgery; Hip fracture surgery (Right, 2000's); Cataract extraction, bilateral; Hip hardware removal (Right, ~ 2011); Abdominal exploration surgery (02/23/2012); and Gastric Roux-En-Y (~ 1226/2013).  No Known Allergies  No current facility-administered medications on file prior to encounter.    Current Outpatient Medications on File Prior to Encounter  Medication Sig  . acetaminophen (TYLENOL) 500 MG tablet Take 500 mg by mouth 2 (two) times daily.  Marland Kitchen amoxicillin-clavulanate (AUGMENTIN) 500-125 MG tablet Take 1 tablet by mouth 2 (two) times daily.  Marland Kitchen aspirin 81 MG chewable tablet Chew by  mouth daily.  . budesonide-formoterol (SYMBICORT) 160-4.5 MCG/ACT inhaler Inhale 2 puffs into the lungs 2 (two) times daily.  . busPIRone (BUSPAR) 7.5 MG tablet Take 7.5 mg by mouth at bedtime as needed.  . Cholecalciferol 1000 units tablet Take 2,000 Units by mouth daily.  Marland Kitchen doxycycline (VIBRAMYCIN) 100 MG capsule Take 100 mg by mouth 2 (two) times daily.  . furosemide (LASIX) 20 MG tablet Take 20 mg by mouth.  . gabapentin (NEURONTIN) 300 MG capsule Take 300 mg by mouth 3 (three) times daily.  Marland Kitchen guaiFENesin (MUCINEX) 600 MG 12 hr tablet Take by mouth 2 (two) times daily.  Marland Kitchen ipratropium-albuterol (DUONEB) 0.5-2.5 (3) MG/3ML SOLN Take 3 mLs by nebulization.  . metoprolol tartrate (LOPRESSOR) 25 MG tablet Take 25 mg by mouth once.  . potassium chloride (K-DUR) 10 MEQ tablet Take 10 mEq by mouth daily.  . pramipexole (MIRAPEX) 0.25 MG tablet Take 0.25 mg by mouth at bedtime.  . senna-docusate (SENOKOT-S) 8.6-50 MG tablet Take 1 tablet by mouth daily.  . sertraline (ZOLOFT) 50 MG tablet Take 50 mg by mouth at bedtime.  . tamsulosin (FLOMAX) 0.4 MG CAPS capsule Take 0.4 mg by mouth.  . traMADol (ULTRAM) 50 MG tablet Take by mouth every 6 (six) hours as needed.  Marland Kitchen albuterol (PROVENTIL HFA;VENTOLIN HFA) 108 (90 Base) MCG/ACT inhaler Inhale 2 puffs into the lungs every 6 (six) hours as needed for wheezing or shortness of breath.  . esomeprazole (NEXIUM) 20 MG capsule Take 20 mg by mouth daily at 12 noon.  . polyethylene glycol (MIRALAX / GLYCOLAX) packet Take 17 g by mouth daily.    FAMILY HISTORY:  His indicated that his mother is deceased. He indicated that his father is deceased. He  indicated that the status of his brother is unknown. He indicated that the status of his son is unknown.   SOCIAL HISTORY: He  reports that he has quit smoking. His smoking use included cigarettes. He has a 150.00 pack-year smoking history. he has never used smokeless tobacco. He reports that he does not drink  alcohol or use drugs.  REVIEW OF SYSTEMS:     SUBJECTIVE:  As above  VITAL SIGNS: BP (!) 94/58   Pulse (!) 49   Temp 98.3 F (36.8 C) (Axillary)   Resp (!) 25   Ht 5' 0.5" (1.537 m)   Wt 162 lb (73.5 kg)   SpO2 93%   BMI 31.12 kg/m     VENTILATOR SETTINGS: FiO2 (%):  [50 %-100 %] 50 %  INTAKE / OUTPUT: No intake/output data recorded.  PHYSICAL EXAMINATION: General: This is an elderly male appearing his stated 89 years in no acute distress. Neuro: History is obtained via translator he is entirely alert and appropriate Cardiovascular: 1 and S2 are currently regular with an occasional ectopic.  There is no murmur rub or gallop.  There is no JVD, there is no dependent edema. Lungs: Respirations are unlabored, there is symmetric air movement occasional scattered rhonchi, no wheezes. Abdomen: Abdomen is soft without any overt organomegaly masses tenderness guarding or rebound he has a complex ventral hernia to the left of the midline with palpable loops of bowel in the hernia.  It is easily reducible and there is no tenderness  LABS:  BMET Recent Labs  Lab Feb 22, 2017 2134  NA 133*  K 4.4  CL 95*  CO2 34*  BUN 17  CREATININE 0.82  GLUCOSE 105*    Electrolytes Recent Labs  Lab 02/22/17 2134  CALCIUM 8.5*    CBC Recent Labs  Lab 2017/02/22 2134 02/01/17 0144 02/01/17 1029  WBC 15.9* 15.7* 12.0*  HGB 11.9* 12.2* 11.9*  HCT 38.9* 39.7 38.8*  PLT 190 159 147*    Coag's Recent Labs  Lab 02-22-17 2334 02/01/17 1029  APTT  --  35  INR 1.17 1.19    Sepsis Markers Recent Labs  Lab 02/01/17 0155  LATICACIDVEN 0.73    ABG Recent Labs  Lab 02/01/17 1121  PHART 7.345*  PCO2ART 61.2*  PO2ART 71.0*    Liver Enzymes No results for input(s): AST, ALT, ALKPHOS, BILITOT, ALBUMIN in the last 168 hours.  Cardiac Enzymes Recent Labs  Lab 02/01/17 0144  TROPONINI <0.03    Glucose No results for input(s): GLUCAP in the last 168  hours.  Imaging Ct Pelvis Wo Contrast  Result Date: 22-Feb-2017 CLINICAL DATA:  Fall today with right hip pain. Acetabular fracture on radiograph. EXAM: CT PELVIS WITHOUT CONTRAST TECHNIQUE: Multidetector CT imaging of the pelvis was performed following the standard protocol without intravenous contrast. COMPARISON:  Radiographs earlier this day. FINDINGS: Urinary Tract: Irregular bladder wall thickening. Two stones in the right urinary trigone. Bowel: Lower ventral abdominal wall hernia containing small bowel. Mild fecalization of bowel contents in the hernia, no bowel wall thickening or free fluid. No evidence of obstruction. Vascular/Lymphatic: Aorto bi-iliac atherosclerosis. Small bilateral external iliac nodes. Reproductive:  Prostatic calcifications. Other: Right extraperitoneal stranding consistent secondary to pelvic fractures. Musculoskeletal: Comminuted right pelvic fracture. Comminuted displaced fracture involving the iliac wing extending to the anterior aspect of the sacroiliac joint. Equivocal minimal widening of the lower right sacroiliac joint. Distal fracture extent involves the acetabulum with minimal articular surface displacement. Fracture involves the anterior wall of the acetabulum  at the puboacetabular junction. Nondisplaced fracture of the right inferior pubic ramus. Intramedullary rod in the right proximal femur with healed posttraumatic proximal femoral fracture. Intramedullary rod with trans trochanteric screws traverse the left proximal femur and remote intertrochanteric femur fracture. No additional left-sided acute pelvic fracture. Pubic symphysis is congruent with mild chondrocalcinosis. Advanced bony under mineralization. IMPRESSION: 1. Complex right pelvic fracture. Comminuted displaced fracture involves the right iliac wing extending from the anterior aspect of the sacroiliac joint to the anterior acetabulum at the puboacetabular junction. Minimal widening of the lower SI joint.  Minimal articular up off of the acetabulum. There is nondisplaced right inferior pubic ramus fracture. 2. Associated extraperitoneal hemorrhage associated with pelvic fractures. 3. Bilateral proximal femoral hardware without periprosthetic fracture. 4. Irregular bladder wall thickening with small bladder stones. Recommend correlation with urinalysis. Bladder wall irregularity was seen on abdominal CT 01/25/2016 and appears chronic. 5.  Aortic Atherosclerosis (ICD10-I70.0). Electronically Signed   By: Rubye OaksMelanie  Ehinger M.D.   On: 2016/11/13 23:02   Portable Chest X-ray 1 View  Result Date: 02/01/2017 CLINICAL DATA:  Hypoxemia EXAM: PORTABLE CHEST 1 VIEW COMPARISON:  2016/11/13 FINDINGS: 0845 hours. The cardio pericardial silhouette is enlarged. The lungs are clear without focal pneumonia, edema, pneumothorax or pleural effusion. Interstitial markings are diffusely coarsened with chronic features. Bones are diffusely demineralized. Telemetry leads overlie the chest. IMPRESSION: Cardiomegaly with underlying chronic interstitial lung disease. Electronically Signed   By: Kennith CenterEric  Mansell M.D.   On: 02/01/2017 09:08   Dg Chest Port 1 View  Result Date: 02/01/2017 CLINICAL DATA:  Respiratory difficulty EXAM: PORTABLE CHEST 1 VIEW COMPARISON:  01/25/2016 FINDINGS: Diffuse interstitial opacity. No pleural effusion. Mild cardiomegaly with aortic atherosclerosis. No definite pneumothorax. IMPRESSION: 1. Diffuse interstitial opacity could reflect mild interstitial edema on underlying chronic change. No focal pulmonary infiltrate 2. Mild cardiomegaly Electronically Signed   By: Jasmine PangKim  Fujinaga M.D.   On: 02/01/2017 00:13   Dg Knee Complete 4 Views Right  Result Date: 02/28/2017 CLINICAL DATA:  Fall on concrete today.  Right hip and knee pain. EXAM: RIGHT KNEE - COMPLETE 4+ VIEW COMPARISON:  Femur radiograph 03/29/2014 FINDINGS: Intramedullary nail distal locking screw in the distal femur, partially included. There is  diffuse medially oriented with rod approaching the medial cortex and associated chronic cortical thickening. No acute fracture of the knee. The alignment is maintained. Mild medial tibiofemoral joint space narrowing. No knee joint effusion. Advanced bony under mineralization. IMPRESSION: 1. No acute fracture of the right knee. 2. Intramedullary rod in the distal femur is partially included, appears medially oriented with chronic cortical thickening. Cortical thickening is likely new from prior exam, and could predispose to hardware failure. Electronically Signed   By: Rubye OaksMelanie  Ehinger M.D.   On: 2016/11/13 22:42   Dg Hip Unilat W Or Wo Pelvis 2-3 Views Right  Result Date: 02/14/2017 CLINICAL DATA:  Acute right hip pain following fall today. Initial encounter. EXAM: DG HIP (WITH OR WITHOUT PELVIS) 2-3V RIGHT COMPARISON:  11/07/2014 radiographs FINDINGS: There is a fracture of the right acetabulum and right iliopubic ring. No dislocation identified. An intramedullary rod within the right femur are is again noted as well as a remote proximal right femur fracture. Left femur intramedullary rod and screw and remote proximal left femur fracture noted. IMPRESSION: Right acetabular and right iliopubic ring fracture. Consider CT for further evaluation. Electronically Signed   By: Harmon PierJeffrey  Hu M.D.   On: 2016/11/13 21:32     STUDIES:  CTA is pending  DISCUSSION: This is an 83 year old with O2 dependent COPD at baseline who is suffered from a fall with a subsequent right pelvic fracture.  We are asked to see the patient because of increasing oxygen requirement, and atrial fibrillation with rapid ventricular response.  There is no overt provocation by history exam or x-ray for an increased oxygen requirement and if his creatinine returns normal I think a CTA as you have ordered is prudent.  Pertinent to the A. fib he has already responded to amiodarone I have no other suggested intervention other than to ensure  adequate pain control which I was suspect was the driver of his arrhythmia.  Serial enzymes have been ordered and electrolytes are pending.  ASSESSMENT / PLAN:  PULMONARY A: COPD with an increasing oxygen requirement following injury.  No overt parenchymal defect on chest x-ray.  Agree with CTA as planned.  CARDIOVASCULAR A: Atrial fibrillation with rapid ventricular response has responded to amiodarone.  Serial cardiac enzymes TSH and electrolytes have been ordered.  Recent echocardiogram showed normal LV function.       Penny PiaWJ Gray, MD Pulmonary and Critical Care Medicine [facility] Pager: [phone]  02/01/2017, 11:36 AM

## 2017-02-01 NOTE — ED Notes (Signed)
MD at bedside. 

## 2017-02-01 NOTE — ED Notes (Signed)
Attempt to call report to ICU, RN not available.

## 2017-02-01 NOTE — ED Notes (Signed)
Neil Terry was notified [phone] [facility] and given [facility] number.

## 2017-02-01 NOTE — Consult Note (Signed)
Orthopedic Consultation   Neil Terry  WRU:[id number]  DOB: [birth year]  DOA: 02/25/2017   Requesting physician: Dr Willette Pa  Reason for consultation: Right acetabular and pelvic fracture  History of Present Illness: Neil Terry is an 83 y.o. male who suffered a ground level fall while negotiating stairs with a cane late yesterday.  He complained of immediate right hip pain and was unable to stand after the fall and was transported to the hospital for further evaluation and treatment.  He also complains of some right elbow pain but denies left LE or left hip pain.  The patient has good family support and lives with them. 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   Past Medical History: Past Medical History:  Diagnosis Date  . BPH (benign prostatic hypertrophy)   . Bronchial asthma   . COPD (chronic obstructive pulmonary disease) (HCC)   . Depression    hx  . Duodenal ulcer    perforated ~ 2013  . GERD (gastroesophageal reflux disease)   . HTN (hypertension)   . On home oxygen therapy    "8h/day; don't know how many liters"    Past Surgical History: Past Surgical History:  Procedure Laterality Date  . ABDOMINAL EXPLORATION SURGERY  02/23/2012  . CATARACT EXTRACTION, BILATERAL    . FRACTURE SURGERY    . GASTRIC ROUX-EN-Y  ~ 1226/2013   closure of duodenal stump w/Roux-en-Y gastro jejunalanastomosis  . HIP FRACTURE SURGERY Right 2000's  . HIP HARDWARE REMOVAL Right ~ 2011  . PARTIAL GASTRECTOMY  2013-2014   distal; w/closure of duodenal stump; gastro jejunalanastomosis  . TRANSURETHRAL RESECTION OF PROSTATE  2016     Allergies:  No Known Allergies   Social History:  reports that he has quit smoking. His smoking use included cigarettes. He has a 150.00 pack-year smoking history. he has never used smokeless tobacco. He reports that he does not drink alcohol or use drugs.   Family History: Family History  Problem Relation Age of Onset  . Arrhythmia Son    . Stroke Brother   . Other Mother        Died of old age  . Asthma Father      Physical Exam: Vitals:   02/01/17 0845 02/01/17 0900 02/01/17 0915 02/01/17 0933  BP: (!) 99/54 105/60 (!) 98/59 (!) 98/59  Pulse: 63 75 66 73  Resp: 19 (!) 28 (!) 26 (!) 25  Temp:      TempSrc:      SpO2: 97% 96% 95% 95%  Weight:      Height:        Physical Exam: Bilateral shoulders with relatively pain free AROM and PROM. No obvious swelling or deformity. Right elbow with an abrasion but no effusion and normal PROM with no pain. Normal AROM left UE with no pain. Left LE pain free ankle ROM and knee ROM. Right leg externally rotated. Somewhat cool distally, but able to move the ankle with no pain. Sensation intact to light touch in the right foot. Unable to assess ROM in the right hip due to pain. Knee is nontender but there is some deformity above the knee lateral to the quad tendon(?old surgery in [place])   Data reviewed:  I have personally reviewed following labs and imaging studies Labs:  CBC: Recent Labs  Lab 02/03/2017 2134 02/01/17 0144  WBC 15.9* 15.7*  NEUTROABS 14.2*  --   HGB 11.9* 12.2*  HCT 38.9* 39.7  MCV 86.1 85.9  PLT 190 159    Basic Metabolic Panel: Recent Labs  Lab 2016-07-16 2134  NA 133*  K 4.4  CL 95*  CO2 34*  GLUCOSE 105*  BUN 17  CREATININE 0.82  CALCIUM 8.5*   GFR Estimated Creatinine Clearance: 54 mL/min (by C-G formula based on SCr of 0.82 mg/dL). Liver Function Tests: No results for input(s): AST, ALT, ALKPHOS, BILITOT, PROT, ALBUMIN in the last 168 hours. No results for input(s): LIPASE, AMYLASE in the last 168 hours. No results for input(s): AMMONIA in the last 168 hours. Coagulation profile Recent Labs  Lab 2016-07-16 2334  INR 1.17    Cardiac Enzymes: Recent Labs  Lab 02/01/17 0144  TROPONINI <0.03   BNP: Invalid input(s): POCBNP CBG: No results for input(s): GLUCAP in the last 168 hours. D-Dimer No results for input(s): DDIMER  in the last 72 hours. Hgb A1c No results for input(s): HGBA1C in the last 72 hours. Lipid Profile No results for input(s): CHOL, HDL, LDLCALC, TRIG, CHOLHDL, LDLDIRECT in the last 72 hours. Thyroid function studies No results for input(s): TSH, T4TOTAL, T3FREE, THYROIDAB in the last 72 hours.  Invalid input(s): FREET3 Anemia work up No results for input(s): VITAMINB12, FOLATE, FERRITIN, TIBC, IRON, RETICCTPCT in the last 72 hours. Urinalysis    Component Value Date/Time   COLORURINE AMBER (A) 11/08/2014 1608   APPEARANCEUR CLOUDY (A) 11/08/2014 1608   LABSPEC 1.026 11/08/2014 1608   PHURINE 5.0 11/08/2014 1608   GLUCOSEU NEGATIVE 11/08/2014 1608   HGBUR LARGE (A) 11/08/2014 1608   BILIRUBINUR SMALL (A) 11/08/2014 1608   KETONESUR NEGATIVE 11/08/2014 1608   PROTEINUR 100 (A) 11/08/2014 1608   UROBILINOGEN 1.0 11/08/2014 1608   NITRITE NEGATIVE 11/08/2014 1608   LEUKOCYTESUR SMALL (A) 11/08/2014 1608     Sepsis Labs Invalid input(s): PROCALCITONIN,  WBC,  LACTICIDVEN Microbiology Recent Results (from the past 240 hour(s))  MRSA PCR Screening     Status: Abnormal   Collection Time: 02/01/17  7:00 AM  Result Value Ref Range Status   MRSA by PCR INVALID RESULTS, SPECIMEN SENT FOR CULTURE (A) NEGATIVE Final    Comment: RESULT CALLED TO, READ BACK BY AND VERIFIED WITH: RN [facility] AT [id number] 02/01/17 BY A.DAVIS        The GeneXpert MRSA Assay (FDA approved for NASAL specimens only), is one component of a comprehensive MRSA colonization surveillance program. It is not intended to diagnose MRSA infection nor to guide or monitor treatment for MRSA infections.        Inpatient Medications:   Scheduled Meds: . acetaminophen  500 mg Oral BID  . cholecalciferol  2,000 Units Oral Daily  . diltiazem  10 mg Intravenous Once  . furosemide  20 mg Oral Daily  . gabapentin  300 mg Oral TID  . ipratropium  0.5 mg Nebulization Q6H  . levalbuterol  0.63 mg Nebulization Q8H  .  mometasone-formoterol  2 puff Inhalation BID  . pantoprazole  40 mg Oral Daily  . polyethylene glycol  17 g Oral Daily  . potassium chloride  10 mEq Oral Daily  . pramipexole  0.25 mg Oral QHS  . senna-docusate  1 tablet Oral Daily  . sertraline  50 mg Oral QHS  . tamsulosin  0.4 mg Oral QPC supper   Continuous Infusions: . sodium chloride 100 mL/hr at 02/01/17 0942  . diltiazem (CARDIZEM) infusion       Radiological Exams on Admission: Ct Pelvis Wo Contrast  Result Date: Feb 04, 2017 CLINICAL DATA:  Fall today with right hip pain. Acetabular fracture on radiograph. EXAM: CT PELVIS WITHOUT CONTRAST TECHNIQUE: Multidetector CT imaging of the pelvis was performed following the standard protocol without intravenous contrast. COMPARISON:  Radiographs earlier this day. FINDINGS: Urinary Tract: Irregular bladder wall thickening. Two stones in the right urinary trigone. Bowel: Lower ventral abdominal wall hernia containing small bowel. Mild fecalization of bowel contents in the hernia, no bowel wall thickening or free fluid. No evidence of obstruction. Vascular/Lymphatic: Aorto bi-iliac atherosclerosis. Small bilateral external iliac nodes. Reproductive:  Prostatic calcifications. Other: Right extraperitoneal stranding consistent secondary to pelvic fractures. Musculoskeletal: Comminuted right pelvic fracture. Comminuted displaced fracture involving the iliac wing extending to the anterior aspect of the sacroiliac joint. Equivocal minimal widening of the lower right sacroiliac joint. Distal fracture extent involves the acetabulum with minimal articular surface displacement. Fracture involves the anterior wall of the acetabulum at the puboacetabular junction. Nondisplaced fracture of the right inferior pubic ramus. Intramedullary rod in the right proximal femur with healed posttraumatic proximal femoral fracture. Intramedullary rod with trans trochanteric screws traverse the left proximal femur and remote  intertrochanteric femur fracture. No additional left-sided acute pelvic fracture. Pubic symphysis is congruent with mild chondrocalcinosis. Advanced bony under mineralization. IMPRESSION: 1. Complex right pelvic fracture. Comminuted displaced fracture involves the right iliac wing extending from the anterior aspect of the sacroiliac joint to the anterior acetabulum at the puboacetabular junction. Minimal widening of the lower SI joint. Minimal articular up off of the acetabulum. There is nondisplaced right inferior pubic ramus fracture. 2. Associated extraperitoneal hemorrhage associated with pelvic fractures. 3. Bilateral proximal femoral hardware without periprosthetic fracture. 4. Irregular bladder wall thickening with small bladder stones. Recommend correlation with urinalysis. Bladder wall irregularity was seen on abdominal CT 01/25/2016 and appears chronic. 5.  Aortic Atherosclerosis (ICD10-I70.0). Electronically Signed   By: Rubye Oaks M.D.   On: 02-04-2017 23:02   Portable Chest X-ray 1 View  Result Date: 02/01/2017 CLINICAL DATA:  Hypoxemia EXAM: PORTABLE CHEST 1 VIEW COMPARISON:  04-Feb-2017 FINDINGS: 0845 hours. The cardio pericardial silhouette is enlarged. The lungs are clear without focal pneumonia, edema, pneumothorax or pleural effusion. Interstitial markings are diffusely coarsened with chronic features. Bones are diffusely demineralized. Telemetry leads overlie the chest. IMPRESSION: Cardiomegaly with underlying chronic interstitial lung disease. Electronically Signed   By: Kennith Center M.D.   On: 02/01/2017 09:08   Dg Chest Port 1 View  Result Date: 02/01/2017 CLINICAL DATA:  Respiratory difficulty EXAM: PORTABLE CHEST 1 VIEW COMPARISON:  01/25/2016 FINDINGS: Diffuse interstitial opacity. No pleural effusion. Mild cardiomegaly with aortic atherosclerosis. No definite pneumothorax. IMPRESSION: 1. Diffuse interstitial opacity could reflect mild interstitial edema on underlying chronic  change. No focal pulmonary infiltrate 2. Mild cardiomegaly Electronically Signed   By: Jasmine Pang M.D.   On: 02/01/2017 00:13   Dg Knee Complete 4 Views Right  Result Date: 2017/02/04 CLINICAL DATA:  Fall on concrete today.  Right hip and knee pain. EXAM: RIGHT KNEE - COMPLETE 4+ VIEW COMPARISON:  Femur radiograph 03/29/2014 FINDINGS: Intramedullary nail distal locking screw in the distal femur, partially included. There is diffuse medially oriented with rod approaching the medial cortex and associated chronic cortical thickening. No acute fracture of the knee. The alignment is maintained. Mild medial tibiofemoral joint space narrowing. No knee joint effusion. Advanced bony under mineralization. IMPRESSION: 1. No acute fracture of the right knee. 2. Intramedullary rod in the distal femur is partially included, appears medially oriented with chronic cortical  thickening. Cortical thickening is likely new from prior exam, and could predispose to hardware failure. Electronically Signed   By: Rubye OaksMelanie  Ehinger M.D.   On: 06/29/16 22:42   Dg Hip Unilat W Or Wo Pelvis 2-3 Views Right  Result Date: 02/17/2017 CLINICAL DATA:  Acute right hip pain following fall today. Initial encounter. EXAM: DG HIP (WITH OR WITHOUT PELVIS) 2-3V RIGHT COMPARISON:  11/07/2014 radiographs FINDINGS: There is a fracture of the right acetabulum and right iliopubic ring. No dislocation identified. An intramedullary rod within the right femur are is again noted as well as a remote proximal right femur fracture. Left femur intramedullary rod and screw and remote proximal left femur fracture noted. IMPRESSION: Right acetabular and right iliopubic ring fracture. Consider CT for further evaluation. Electronically Signed   By: Harmon PierJeffrey  Hu M.D.   On: 06/29/16 21:32    Impression/Recommendations Right acetabular and pelvic fracture, closed. The patient has a relatively minimally displaced acetabular fracture that based upon his advanced  age and medical co-morbidities will likely be best managed non operatively with strict NWB on the Right LE. Dr Charlann Boxerlin aware of patient admission and agrees with this plan at the current time.  He will discuss with Dr Caryn BeeKevin Haddix (ortho trauma) to confirm the plan.  Bed rest, pain management and medical care for now. At risk for DVT - mechanical prophylaxis at a minimum.  Overall condition is guarded requiring stepdown/heart care. Will follow.   Thank you for this consultation.      Verlee RossettiNORRIS,STEVEN R M.D. [facility] 02/01/2017, 9:51 AM

## 2017-02-01 NOTE — Progress Notes (Signed)
  Amiodarone Drug - Drug Interaction Consult Note  Recommendations: No medication interactions at this time  Amiodarone is metabolized by the cytochrome P450 system and therefore has the potential to cause many drug interactions. Amiodarone has an average plasma half-life of 50 days (range 20 to 100 days).   There is potential for drug interactions to occur several weeks or months after stopping treatment and the onset of drug interactions may be slow after initiating amiodarone.   []  Statins: Increased risk of myopathy. Simvastatin- restrict dose to 20mg  daily. Other statins: counsel patients to report any muscle pain or weakness immediately.  []  Anticoagulants: Amiodarone can increase anticoagulant effect. Consider warfarin dose reduction. Patients should be monitored closely and the dose of anticoagulant altered accordingly, remembering that amiodarone levels take several weeks to stabilize.  []  Antiepileptics: Amiodarone can increase plasma concentration of phenytoin, the dose should be reduced. Note that small changes in phenytoin dose can result in large changes in levels. Monitor patient and counsel on signs of toxicity.  []  Beta blockers: increased risk of bradycardia, AV block and myocardial depression. Sotalol - avoid concomitant use.  []   Calcium channel blockers (diltiazem and verapamil): increased risk of bradycardia, AV block and myocardial depression.  []   Cyclosporine: Amiodarone increases levels of cyclosporine. Reduced dose of cyclosporine is recommended.  []  Digoxin dose should be halved when amiodarone is started.  []  Diuretics: increased risk of cardiotoxicity if hypokalemia occurs.  []  Oral hypoglycemic agents (glyburide, glipizide, glimepiride): increased risk of hypoglycemia. Patient's glucose levels should be monitored closely when initiating amiodarone therapy.   []  Drugs that prolong the QT interval:  Torsades de pointes risk may be increased with concurrent use  - avoid if possible.  Monitor QTc, also keep magnesium/potassium WNL if concurrent therapy can't be avoided. Marland Kitchen. Antibiotics: e.g. fluoroquinolones, erythromycin. . Antiarrhythmics: e.g. quinidine, procainamide, disopyramide, sotalol. . Antipsychotics: e.g. phenothiazines, haloperidol.  . Lithium, tricyclic antidepressants, and methadone.   Neil SauersLisa Veanna Terry Pharm.D. CPP, BCPS Clinical Pharmacist [phone] 02/01/2017 11:17 AM

## 2017-02-01 NOTE — Progress Notes (Signed)
Orthopedic Tech Progress Note Patient Details:  Lucienne MinksFarid Panagopoulos Dec 06, 1927 [id number]  Patient ID: Lucienne MinksFarid Elms, male   DOB: Dec 06, 1927, 83 y.o.   MRN: [id number] Pt cant have ohf due to age restrictions  Trinna PostMartinez, Kadeshia Kasparian J 02/01/2017, 7:31 PM

## 2017-02-01 NOTE — ED Notes (Signed)
Pt moaning and pointing to his right hip. Medicated per prn fentanyl order.

## 2017-02-02 ENCOUNTER — Inpatient Hospital Stay (HOSPITAL_COMMUNITY): Payer: Medicare Other

## 2017-02-02 DIAGNOSIS — J441 Chronic obstructive pulmonary disease with (acute) exacerbation: Secondary | ICD-10-CM

## 2017-02-02 LAB — POCT I-STAT 3, ART BLOOD GAS (G3+)
Acid-Base Excess: 4 mmol/L — ABNORMAL HIGH (ref 0.0–2.0)
Acid-Base Excess: 1 mmol/L (ref 0.0–2.0)
BICARBONATE: 34.9 mmol/L — AB (ref 20.0–28.0)
BICARBONATE: 29.6 mmol/L — AB (ref 20.0–28.0)
BICARBONATE: 27.2 mmol/L (ref 20.0–28.0)
O2 Saturation: 88 %
O2 Saturation: 99 %
O2 Saturation: 90 %
PCO2 ART: 87.7 mmHg — AB (ref 32.0–48.0)
PCO2 ART: 72.1 mmHg — AB (ref 32.0–48.0)
PCO2 ART: 50.8 mmHg — AB (ref 32.0–48.0)
PO2 ART: 190 mmHg — AB (ref 83.0–108.0)
PO2 ART: 63 mmHg — AB (ref 83.0–108.0)
Patient temperature: 98.7
Patient temperature: 98.6
Patient temperature: 98.4
TCO2: 38 mmol/L — AB (ref 22–32)
TCO2: 32 mmol/L (ref 22–32)
TCO2: 29 mmol/L (ref 22–32)
pH, Arterial: 7.208 — ABNORMAL LOW (ref 7.350–7.450)
pH, Arterial: 7.222 — ABNORMAL LOW (ref 7.350–7.450)
pH, Arterial: 7.337 — ABNORMAL LOW (ref 7.350–7.450)
pO2, Arterial: 71 mmHg — ABNORMAL LOW (ref 83.0–108.0)

## 2017-02-02 LAB — CBC
HCT: 42.1 % (ref 39.0–52.0)
HEMOGLOBIN: 12.5 g/dL — AB (ref 13.0–17.0)
MCH: 26.4 pg (ref 26.0–34.0)
MCHC: 29.7 g/dL — ABNORMAL LOW (ref 30.0–36.0)
MCV: 88.8 fL (ref 78.0–100.0)
Platelets: 142 10*3/uL — ABNORMAL LOW (ref 150–400)
RBC: 4.74 MIL/uL (ref 4.22–5.81)
RDW: 14.7 % (ref 11.5–15.5)
WBC: 17.9 10*3/uL — ABNORMAL HIGH (ref 4.0–10.5)

## 2017-02-02 LAB — COMPREHENSIVE METABOLIC PANEL
ALBUMIN: 3 g/dL — AB (ref 3.5–5.0)
ALT: 11 U/L — ABNORMAL LOW (ref 17–63)
AST: 20 U/L (ref 15–41)
Alkaline Phosphatase: 112 U/L (ref 38–126)
Anion gap: 7 (ref 5–15)
BUN: 19 mg/dL (ref 6–20)
CHLORIDE: 96 mmol/L — AB (ref 101–111)
CO2: 32 mmol/L (ref 22–32)
Calcium: 8.2 mg/dL — ABNORMAL LOW (ref 8.9–10.3)
Creatinine, Ser: 0.88 mg/dL (ref 0.61–1.24)
GFR calc Af Amer: >60 mL/min (ref >60)
GFR calc non Af Amer: >60 mL/min (ref >60)
GLUCOSE: 112 mg/dL — AB (ref 65–99)
POTASSIUM: 4.8 mmol/L (ref 3.5–5.1)
Sodium: 135 mmol/L (ref 135–145)
Total Bilirubin: 1 mg/dL (ref 0.3–1.2)
Total Protein: 6.9 g/dL (ref 6.5–8.1)

## 2017-02-02 LAB — MAGNESIUM
Magnesium: 2 mg/dL (ref 1.7–2.4)
Magnesium: 1.9 mg/dL (ref 1.7–2.4)
Magnesium: 1.7 mg/dL (ref 1.7–2.4)

## 2017-02-02 LAB — PROTIME-INR
INR: 1.21
PROTHROMBIN TIME: 15.3 s — AB (ref 11.4–15.2)

## 2017-02-02 LAB — GLUCOSE, CAPILLARY
Glucose-Capillary: 104 mg/dL — ABNORMAL HIGH (ref 65–99)
Glucose-Capillary: 160 mg/dL — ABNORMAL HIGH (ref 65–99)
Glucose-Capillary: 164 mg/dL — ABNORMAL HIGH (ref 65–99)
Glucose-Capillary: 171 mg/dL — ABNORMAL HIGH (ref 65–99)

## 2017-02-02 LAB — MRSA CULTURE: CULTURE: NOT DETECTED

## 2017-02-02 LAB — PHOSPHORUS
Phosphorus: 4.8 mg/dL — ABNORMAL HIGH (ref 2.5–4.6)
Phosphorus: 1.8 mg/dL — ABNORMAL LOW (ref 2.5–4.6)

## 2017-02-02 MED ORDER — FENTANYL CITRATE (PF) 100 MCG/2ML IJ SOLN
INTRAMUSCULAR | Status: AC
  Administered 2017-02-02: 100 ug
  Filled 2017-02-02: qty 2

## 2017-02-02 MED ORDER — METHYLPREDNISOLONE SODIUM SUCC 40 MG IJ SOLR
40.0000 mg | Freq: Two times a day (BID) | INTRAMUSCULAR | Status: DC
  Administered 2017-02-02 – 2017-02-03 (×4): 40 mg via INTRAVENOUS
  Filled 2017-02-02 (×4): qty 1

## 2017-02-02 MED ORDER — PRO-STAT SUGAR FREE PO LIQD: 30.0000 mL | Freq: Two times a day (BID) | ORAL | Status: DC

## 2017-02-02 MED ORDER — FENTANYL CITRATE (PF) 100 MCG/2ML IJ SOLN
100.0000 ug | Freq: Once | INTRAMUSCULAR | Status: AC
  Administered 2017-02-02: 100 ug via INTRAVENOUS

## 2017-02-02 MED ORDER — ETOMIDATE 2 MG/ML IV SOLN
7.5000 mg | Freq: Once | INTRAVENOUS | Status: AC
  Administered 2017-02-02: 7.5 mg via INTRAVENOUS

## 2017-02-02 MED ORDER — MIDAZOLAM HCL 5 MG/ML IJ SOLN: 2.0000 mg | Freq: Once | INTRAMUSCULAR | Status: DC

## 2017-02-02 MED ORDER — FENTANYL BOLUS VIA INFUSION
25.0000 ug | INTRAVENOUS | Status: DC | PRN
  Administered 2017-02-03 (×4): 25 ug via INTRAVENOUS
  Filled 2017-02-02: qty 25

## 2017-02-02 MED ORDER — FENTANYL CITRATE (PF) 100 MCG/2ML IJ SOLN: 50.0000 ug | Freq: Once | INTRAMUSCULAR | Status: DC

## 2017-02-02 MED ORDER — MIDAZOLAM HCL 2 MG/2ML IJ SOLN
INTRAMUSCULAR | Status: AC
  Administered 2017-02-02: 2 mg
  Filled 2017-02-02: qty 2

## 2017-02-02 MED ORDER — INSULIN ASPART 100 UNIT/ML ~~LOC~~ SOLN
0.0000 [IU] | SUBCUTANEOUS | Status: DC
  Administered 2017-02-02 – 2017-02-03 (×4): 2 [IU] via SUBCUTANEOUS
  Administered 2017-02-03: 1 [IU] via SUBCUTANEOUS
  Administered 2017-02-03: 2 [IU] via SUBCUTANEOUS
  Administered 2017-02-03 – 2017-02-04 (×3): 1 [IU] via SUBCUTANEOUS
  Administered 2017-02-06: 2 [IU] via SUBCUTANEOUS
  Administered 2017-02-06 – 2017-02-10 (×5): 1 [IU] via SUBCUTANEOUS
  Administered 2017-02-11: 2 [IU] via SUBCUTANEOUS

## 2017-02-02 MED ORDER — VITAL AF 1.2 CAL PO LIQD
1000.0000 mL | ORAL | Status: DC
  Administered 2017-02-02: 1000 mL

## 2017-02-02 MED ORDER — FUROSEMIDE 10 MG/ML IJ SOLN
40.0000 mg | Freq: Two times a day (BID) | INTRAMUSCULAR | Status: DC
  Administered 2017-02-02 (×2): 40 mg via INTRAVENOUS
  Filled 2017-02-02 (×3): qty 4

## 2017-02-02 MED ORDER — FENTANYL 2500MCG IN NS 250ML (10MCG/ML) PREMIX INFUSION
25.0000 ug/h | INTRAVENOUS | Status: DC
  Administered 2017-02-02: 50 ug/h via INTRAVENOUS
  Administered 2017-02-03: 175 ug/h via INTRAVENOUS
  Administered 2017-02-03: 350 ug/h via INTRAVENOUS
  Administered 2017-02-03: 175 ug/h via INTRAVENOUS
  Administered 2017-02-04: 350 ug/h via INTRAVENOUS
  Administered 2017-02-04 – 2017-02-05 (×3): 250 ug/h via INTRAVENOUS
  Administered 2017-02-05: 400 ug/h via INTRAVENOUS
  Administered 2017-02-05: 200 ug/h via INTRAVENOUS
  Administered 2017-02-06 (×2): 375 ug/h via INTRAVENOUS
  Administered 2017-02-06 (×2): 350 ug/h via INTRAVENOUS
  Administered 2017-02-07: 400 ug/h via INTRAVENOUS
  Administered 2017-02-07: 375 ug/h via INTRAVENOUS
  Administered 2017-02-07: 400 ug/h via INTRAVENOUS
  Administered 2017-02-07: 375 ug/h via INTRAVENOUS
  Administered 2017-02-08 – 2017-02-10 (×10): 400 ug/h via INTRAVENOUS
  Administered 2017-02-10: 200 ug/h via INTRAVENOUS
  Administered 2017-02-10: 400 ug/h via INTRAVENOUS
  Administered 2017-02-11 – 2017-02-12 (×3): 200 ug/h via INTRAVENOUS
  Filled 2017-02-02 (×32): qty 250

## 2017-02-02 MED ORDER — VITAL HIGH PROTEIN PO LIQD: 1000.0000 mL | ORAL | Status: DC

## 2017-02-02 MED ORDER — DEXTROSE 5 % IV SOLN
1.0000 g | Freq: Two times a day (BID) | INTRAVENOUS | Status: DC
  Administered 2017-02-02 – 2017-02-03 (×4): 1 g via INTRAVENOUS
  Filled 2017-02-02 (×6): qty 1

## 2017-02-02 NOTE — Consult Note (Signed)
PULMONARY / CRITICAL CARE MEDICINE   Name: Neil Terry MRN: [id number] DOB: 1927/07/31    ADMISSION DATE:  02/18/2017 CONSULTATION DATE:  02/01/2017  REFERRING MD:  Randa Spike  CHIEF COMPLAINT: We are asked to consult for increasing oxygen requirement and atrial fibrillation with rapid ventricular response  HISTORY OF PRESENT ILLNESS:   This is an 83 year old with oxygen dependent COPD at baseline who suffered from a ground-level fall yesterday.  Since admission he has developed increasing oxygen requirements and is developed atrial fibrillation with rapid ventricular response.  An amiodarone infusion was initiated this morning and he is already responded with a heart rate in the 80s during my examination.  He denies any increase in his usual cough or any subjective increase in dyspnea.  He is not having any sort of chest pain including including pleuritic chest pain.   SUBJECTIVE:  Lethargic resp failure on 100%  VITAL SIGNS: BP (!) 65/36   Pulse 86   Temp 98.7 F (37.1 C) (Axillary)   Resp (!) 24   Ht 5' 0.5" (1.537 m)   Wt 69.8 kg (153 lb 14.1 oz)   SpO2 96%   BMI 29.56 kg/m     VENTILATOR SETTINGS: Vent Mode: PRVC FiO2 (%):  [40 %-100 %] 100 % Set Rate:  [24 bmp] 24 bmp Vt Set:  [410 mL] 410 mL PEEP:  [5 cmH20] 5 cmH20 Plateau Pressure:  [27 cmH20] 27 cmH20  INTAKE / OUTPUT: I/O last 3 completed shifts: In: 6720 [P.O.:120; I.V.:919] Out: 1775 [Urine:1775]  PHYSICAL EXAMINATION: General: not awake, severe distress Neuro: not fc, perrl, moving ext uppers HEENT: jvd wnl or elevated PULM: coarse slight left greater rt CV:  s1 s2 RRR SR now GI: soft, reducible hernia Extremities:  No edema, tender hip left  LABS:  BMET Recent Labs  Lab 02/23/2017 2134 02/01/17 1029 02/02/17 0229  NA 133* 134* 135  K 4.4 4.4 4.8  CL 95* 94* 96*  CO2 34* 32 32  BUN '17 20 19  '$ CREATININE 0.82 0.92 0.88  GLUCOSE 105* 120* 112*    Electrolytes Recent Labs  Lab  03/01/2017 2134 02/01/17 1029 02/02/17 0229  CALCIUM 8.5* 8.2* 8.2*  MG  --  2.0 2.0    CBC Recent Labs  Lab 02/01/17 0144 02/01/17 1029 02/02/17 0229  WBC 15.7* 12.0* 17.9*  HGB 12.2* 11.9* 12.5*  HCT 39.7 38.8* 42.1  PLT 159 147* 142*    Coag's Recent Labs  Lab 02/12/2017 2334 02/01/17 1029 02/02/17 0229  APTT  --  35  --   INR 1.17 1.19 1.21    Sepsis Markers Recent Labs  Lab 02/01/17 0155  LATICACIDVEN 0.73    ABG Recent Labs  Lab 02/01/17 1121 02/02/17 0942  PHART 7.345* 7.208*  PCO2ART 61.2* 87.7*  PO2ART 71.0* 71.0*    Liver Enzymes Recent Labs  Lab 02/02/17 0229  AST 20  ALT 11*  ALKPHOS 112  BILITOT 1.0  ALBUMIN 3.0*    Cardiac Enzymes Recent Labs  Lab 02/01/17 0144 02/01/17 1029  TROPONINI <0.03 <0.03    Glucose No results for input(s): GLUCAP in the last 168 hours.  Imaging Ct Angio Chest Pe W Or Wo Contrast  Result Date: 02/01/2017 CLINICAL DATA:  Interstitial lung disease.  Fall. EXAM: CT ANGIOGRAPHY CHEST WITH CONTRAST TECHNIQUE: Multidetector CT imaging of the chest was performed using the standard protocol during bolus administration of intravenous contrast. Multiplanar CT image reconstructions and MIPs were obtained to evaluate the vascular anatomy. CONTRAST:  126m ISOVUE-370 IOPAMIDOL (ISOVUE-370) INJECTION 76% COMPARISON:  None. FINDINGS: Cardiovascular: Cardiomegaly. Reflux of contrast into the IVC and hepatic veins suggest right heart failure. Tortuosity of the thoracic aorta. No aneurysm. No filling defects in the pulmonary arteries to suggest pulmonary emboli. Mediastinum/Nodes: No mediastinal, hilar, or axillary adenopathy. Lungs/Pleura: Linear areas of subsegmental atelectasis in the lung bases. No effusions. Upper Abdomen: Imaging into the upper abdomen shows no acute findings. Musculoskeletal: Chest wall soft tissues are unremarkable. No acute bony abnormality. Numerous compression fractures throughout the thoracic spine,  stable. Review of the MIP images confirms the above findings. IMPRESSION: Cardiomegaly. Reflux of contrast into the IVC and hepatic veins suggests right heart failure. Tortuosity of the thoracic aorta.  No aneurysm. No evidence of pulmonary embolus. Bibasilar atelectasis. Electronically Signed   By: KRolm BaptiseM.D.   On: 02/01/2017 13:59     STUDIES:  CTA 12/2- copd, no pe    DISCUSSION: This is an 83year old with O2 dependent COPD at baseline who is suffered from a fall with a subsequent right pelvic fracture.  We are asked to see the patient because of increasing oxygen requirement, and atrial fibrillation with rapid ventricular response.  There is no overt provocation by history exam or x-ray for an increased oxygen requirement and if his creatinine returns normal I think a CTA as you have ordered is prudent.  Pertinent to the A. fib he has already responded to amiodarone I have no other suggested intervention other than to ensure adequate pain control which I was suspect was the driver of his arrhythmia.  Serial enzymes have been ordered and electrolytes are pending.  ASSESSMENT / PLAN:  COPD with exacerbation Acute resp failure ATX  Assess for asp PNA/ hcap Fib rvr with diastolic CHF -stat abg obtained by me -pt requires emergent intubation,   -8 cc/ kg, rate 22-24, max, avoid high RR as avoid resp alk with copd -100%, may need peep, follow abg -STAT pcxr -lasix consideration to neg balance -chem in am  -IV steroids, Bders -amio for now to maintaiN SR, but get esr baseline now, with his chronic changes would like to limit duration amio -ceftaz, empiric, sputum -start feeds early -dvt prevntion -ppi -assess cbg - I notified [facility] of events, wil take on our service -updated son in room twice   Ccm time 45 min   DLavon Paganini FTitus Mould MD, FKings PointPgr: [facility]

## 2017-02-02 NOTE — Progress Notes (Signed)
Chart reviewed, discussed with Dr. Tyson [name]. Given clinical decompensation patient is transferring to ICU and PCCM is assuming care immediately.  TR[facility] will sign off.  Brendia Sacksaniel Masao Junker, MD [facility] [phone]

## 2017-02-02 NOTE — Plan of Care (Signed)
  Progressing Pain Management: Pain level will decrease 02/02/2017 0457 - Progressing by Leata MouseAninon, Chaston Bradburn S, RN Activity: Ability to tolerate increased activity will improve 02/02/2017 0457 - Progressing by Leata MouseAninon, Farris Geiman S, RN Cardiac: Ability to achieve and maintain adequate cardiopulmonary perfusion will improve 02/02/2017 0457 - Progressing by Leata MouseAninon, Olina Melfi S, RN Clinical Measurements: Ability to maintain clinical measurements within normal limits will improve 02/02/2017 0457 - Progressing by Leata MouseAninon, Denyse Fillion S, RN Will remain free from infection 02/02/2017 0457 - Progressing by Leata MouseAninon, Goldye Tourangeau S, RN Diagnostic test results will improve 02/02/2017 0457 - Progressing by Leata MouseAninon, Aija Scarfo S, RN Respiratory complications will improve 02/02/2017 0457 - Progressing by Leata MouseAninon, Pearlene Teat S, RN Cardiovascular complication will be avoided 02/02/2017 0457 - Progressing by Leata MouseAninon, Anjolaoluwa Siguenza S, RN Coping: Level of anxiety will decrease 02/02/2017 0457 - Progressing by Leata MouseAninon, Laxmi Choung S, RN Pain Managment: General experience of comfort will improve 02/02/2017 0457 - Progressing by Leata MouseAninon, Catrinia Racicot S, RN Safety: Ability to remain free from injury will improve 02/02/2017 0457 - Progressing by Leata MouseAninon, Quana Chamberlain S, RN Skin Integrity: Risk for impaired skin integrity will decrease 02/02/2017 0457 - Progressing by Leata MouseAninon, Jun Osment S, RN

## 2017-02-02 NOTE — Procedures (Signed)
OGTR  RN unable to place  I positioned pt chin down and placed OGT without resistance Xray pending  Mcarthur RossettiDaniel J. Tyson [name], MD, FACP Pgr: [phone] [facility]

## 2017-02-02 NOTE — Progress Notes (Signed)
Critical ABG values: RBV; Patrick JupiterJ. Coble, RN at [phone] by S. Vonzell Lindblad, RRT on 02/02/17.

## 2017-02-02 NOTE — Progress Notes (Signed)
DAILY PROGRESS NOTE   Patient Name: Neil Terry Date of Encounter: 02/02/2017  Chief Complaint   Somnolent, on NRB mask  Patient Profile   Neil Terry is a 83 y.o. male with a hx of atrial fibrillation who is being seen today for the evaluation of atrial fibrillation w RVR at the request of Dr. Evangeline Gula.  Subjective   Decompensated respiratory status, but maintaining sinus on amiodarone. Somnolent today, barely arousable. No plans per orthopedics for surgery. D/w son [name] at the bedside - unfortunately, he has had numerous falls recently and would be at very high risk for anticoagulation.  Objective   Vitals:   02/02/17 0700 02/02/17 0729 02/02/17 0800 02/02/17 0802  BP: (!) 94/50 (!) 94/50 (!) 95/50   Pulse: 71 71 70   Resp: (!) 28 (!) 24 (!) 22   Temp:    98.7 F (37.1 C)  TempSrc:    Axillary  SpO2: 99% 99% 100%   Weight:      Height:        Intake/Output Summary (Last 24 hours) at 02/02/2017 0840 Last data filed at 02/02/2017 0800 Gross per 24 hour  Intake 1112.36 ml  Output 1835 ml  Net -722.64 ml   Filed Weights   02/17/2017 2020 02/02/17 0348  Weight: 162 lb (73.5 kg) 153 lb 14.1 oz (69.8 kg)    Physical Exam   General appearance: somnolent, barely arousable, on NRB Lungs: diminished breath sounds bilaterally Heart: regular rate and rhythm Extremities: extremities normal, atraumatic, no cyanosis or edema Neurologic: Mental status: somnolent, on NRB  Inpatient Medications    Scheduled Meds: . acetaminophen  500 mg Oral BID  . cholecalciferol  2,000 Units Oral Daily  . diltiazem  10 mg Intravenous Once  . furosemide  20 mg Oral Daily  . gabapentin  300 mg Oral TID  . ipratropium  0.5 mg Nebulization TID  . levalbuterol  0.63 mg Nebulization TID  . mometasone-formoterol  2 puff Inhalation BID  . pantoprazole  40 mg Oral Daily  . polyethylene glycol  17 g Oral Daily  . potassium chloride  10 mEq Oral Daily  . pramipexole  0.25 mg Oral QHS  .  senna-docusate  1 tablet Oral Daily  . sertraline  50 mg Oral QHS  . tamsulosin  0.4 mg Oral QPC supper    Continuous Infusions: . sodium chloride 20 mL/hr at 02/02/17 0800  . amiodarone 30 mg/hr (02/02/17 0800)  . diltiazem (CARDIZEM) infusion      PRN Meds: busPIRone, diltiazem, fentaNYL (SUBLIMAZE) injection, morphine injection, traMADol   Labs   Results for orders placed or performed during the hospital encounter of 02/14/2017 (from the past 48 hour(s))  Basic metabolic panel     Status: Abnormal   Collection Time: 02/24/2017  9:34 PM  Result Value Ref Range   Sodium 133 (L) 135 - 145 mmol/L   Potassium 4.4 3.5 - 5.1 mmol/L   Chloride 95 (L) 101 - 111 mmol/L   CO2 34 (H) 22 - 32 mmol/L   Glucose, Bld 105 (H) 65 - 99 mg/dL   BUN 17 6 - 20 mg/dL   Creatinine, Ser 0.82 0.61 - 1.24 mg/dL   Calcium 8.5 (L) 8.9 - 10.3 mg/dL   GFR calc non Af Amer >60 >60 mL/min   GFR calc Af Amer >60 >60 mL/min    Comment: (NOTE) The eGFR has been calculated using the CKD EPI equation. This calculation has not been validated in all clinical  situations. eGFR's persistently <60 mL/min signify possible Chronic Kidney Disease.    Anion gap 4 (L) 5 - 15  CBC with Differential     Status: Abnormal   Collection Time: 02/01/2017  9:34 PM  Result Value Ref Range   WBC 15.9 (H) 4.0 - 10.5 K/uL   RBC 4.52 4.22 - 5.81 MIL/uL   Hemoglobin 11.9 (L) 13.0 - 17.0 g/dL   HCT 38.9 (L) 39.0 - 52.0 %   MCV 86.1 78.0 - 100.0 fL   MCH 26.3 26.0 - 34.0 pg   MCHC 30.6 30.0 - 36.0 g/dL   RDW 14.4 11.5 - 15.5 %   Platelets 190 150 - 400 K/uL   Neutrophils Relative % 89 %   Neutro Abs 14.2 (H) 1.7 - 7.7 K/uL   Lymphocytes Relative 5 %   Lymphs Abs 0.8 0.7 - 4.0 K/uL   Monocytes Relative 5 %   Monocytes Absolute 0.8 0.1 - 1.0 K/uL   Eosinophils Relative 1 %   Eosinophils Absolute 0.2 0.0 - 0.7 K/uL   Basophils Relative 0 %   Basophils Absolute 0.0 0.0 - 0.1 K/uL  Protime-INR     Status: None   Collection Time:  02/06/2017 11:34 PM  Result Value Ref Range   Prothrombin Time 14.8 11.4 - 15.2 seconds   INR 1.17   CBC     Status: Abnormal   Collection Time: 02/01/17  1:44 AM  Result Value Ref Range   WBC 15.7 (H) 4.0 - 10.5 K/uL   RBC 4.62 4.22 - 5.81 MIL/uL   Hemoglobin 12.2 (L) 13.0 - 17.0 g/dL   HCT 39.7 39.0 - 52.0 %   MCV 85.9 78.0 - 100.0 fL   MCH 26.4 26.0 - 34.0 pg   MCHC 30.7 30.0 - 36.0 g/dL   RDW 14.3 11.5 - 15.5 %   Platelets 159 150 - 400 K/uL  Troponin I     Status: None   Collection Time: 02/01/17  1:44 AM  Result Value Ref Range   Troponin I <0.03 <0.03 ng/mL  I-Stat CG4 Lactic Acid, ED     Status: None   Collection Time: 02/01/17  1:55 AM  Result Value Ref Range   Lactic Acid, Venous 0.73 0.5 - 1.9 mmol/L  MRSA PCR Screening     Status: Abnormal   Collection Time: 02/01/17  7:00 AM  Result Value Ref Range   MRSA by PCR INVALID RESULTS, SPECIMEN SENT FOR CULTURE (A) NEGATIVE    Comment: RESULT CALLED TO, READ BACK BY AND VERIFIED WITH: RN [facility] AT 2633 02/01/17 BY [email or web address]        The GeneXpert MRSA Assay (FDA approved for NASAL specimens only), is one component of a comprehensive MRSA colonization surveillance program. It is not intended to diagnose MRSA infection nor to guide or monitor treatment for MRSA infections.   Basic metabolic panel     Status: Abnormal   Collection Time: 02/01/17 10:29 AM  Result Value Ref Range   Sodium 134 (L) 135 - 145 mmol/L   Potassium 4.4 3.5 - 5.1 mmol/L   Chloride 94 (L) 101 - 111 mmol/L   CO2 32 22 - 32 mmol/L   Glucose, Bld 120 (H) 65 - 99 mg/dL   BUN 20 6 - 20 mg/dL   Creatinine, Ser 0.92 0.61 - 1.24 mg/dL   Calcium 8.2 (L) 8.9 - 10.3 mg/dL   GFR calc non Af Amer >60 >60 mL/min   GFR calc Af Amer >60 >60  mL/min    Comment: (NOTE) The eGFR has been calculated using the CKD EPI equation. This calculation has not been validated in all clinical situations. eGFR's persistently <60 mL/min signify possible Chronic  Kidney Disease.    Anion gap 8 5 - 15  Magnesium     Status: None   Collection Time: 02/01/17 10:29 AM  Result Value Ref Range   Magnesium 2.0 1.7 - 2.4 mg/dL  CBC WITH DIFFERENTIAL     Status: Abnormal   Collection Time: 02/01/17 10:29 AM  Result Value Ref Range   WBC 12.0 (H) 4.0 - 10.5 K/uL   RBC 4.46 4.22 - 5.81 MIL/uL   Hemoglobin 11.9 (L) 13.0 - 17.0 g/dL   HCT 38.8 (L) 39.0 - 52.0 %   MCV 87.0 78.0 - 100.0 fL   MCH 26.7 26.0 - 34.0 pg   MCHC 30.7 30.0 - 36.0 g/dL   RDW 14.5 11.5 - 15.5 %   Platelets 147 (L) 150 - 400 K/uL   Neutrophils Relative % 84 %   Neutro Abs 10.1 (H) 1.7 - 7.7 K/uL   Lymphocytes Relative 7 %   Lymphs Abs 0.8 0.7 - 4.0 K/uL   Monocytes Relative 6 %   Monocytes Absolute 0.7 0.1 - 1.0 K/uL   Eosinophils Relative 3 %   Eosinophils Absolute 0.4 0.0 - 0.7 K/uL   Basophils Relative 0 %   Basophils Absolute 0.0 0.0 - 0.1 K/uL  Protime-INR     Status: None   Collection Time: 02/01/17 10:29 AM  Result Value Ref Range   Prothrombin Time 15.0 11.4 - 15.2 seconds   INR 1.19   APTT     Status: None   Collection Time: 02/01/17 10:29 AM  Result Value Ref Range   aPTT 35 24 - 36 seconds  Troponin I     Status: None   Collection Time: 02/01/17 10:29 AM  Result Value Ref Range   Troponin I <0.03 <0.03 ng/mL  TSH     Status: None   Collection Time: 02/01/17 10:29 AM  Result Value Ref Range   TSH 1.438 0.350 - 4.500 uIU/mL    Comment: Performed by a 3rd Generation assay with a functional sensitivity of <=0.01 uIU/mL.  I-STAT 3, arterial blood gas (G3+)     Status: Abnormal   Collection Time: 02/01/17 11:21 AM  Result Value Ref Range   pH, Arterial 7.345 (L) 7.350 - 7.450   pCO2 arterial 61.2 (H) 32.0 - 48.0 mmHg   pO2, Arterial 71.0 (L) 83.0 - 108.0 mmHg   Bicarbonate 33.4 (H) 20.0 - 28.0 mmol/L   TCO2 35 (H) 22 - 32 mmol/L   O2 Saturation 93.0 %   Acid-Base Excess 6.0 (H) 0.0 - 2.0 mmol/L   Patient temperature 98.3 F    Collection site RADIAL,  ALLEN'S TEST ACCEPTABLE    Drawn by RT    Sample type ARTERIAL   Brain natriuretic peptide     Status: Abnormal   Collection Time: 02/01/17  6:44 PM  Result Value Ref Range   B Natriuretic Peptide 217.4 (H) 0.0 - 100.0 pg/mL  CBC     Status: Abnormal   Collection Time: 02/02/17  2:29 AM  Result Value Ref Range   WBC 17.9 (H) 4.0 - 10.5 K/uL   RBC 4.74 4.22 - 5.81 MIL/uL   Hemoglobin 12.5 (L) 13.0 - 17.0 g/dL   HCT 42.1 39.0 - 52.0 %   MCV 88.8 78.0 - 100.0 fL   MCH  26.4 26.0 - 34.0 pg   MCHC 29.7 (L) 30.0 - 36.0 g/dL   RDW 14.7 11.5 - 15.5 %   Platelets 142 (L) 150 - 400 K/uL  Protime-INR     Status: Abnormal   Collection Time: 02/02/17  2:29 AM  Result Value Ref Range   Prothrombin Time 15.3 (H) 11.4 - 15.2 seconds   INR 1.21   Comprehensive metabolic panel     Status: Abnormal   Collection Time: 02/02/17  2:29 AM  Result Value Ref Range   Sodium 135 135 - 145 mmol/L   Potassium 4.8 3.5 - 5.1 mmol/L   Chloride 96 (L) 101 - 111 mmol/L   CO2 32 22 - 32 mmol/L   Glucose, Bld 112 (H) 65 - 99 mg/dL   BUN 19 6 - 20 mg/dL   Creatinine, Ser 0.88 0.61 - 1.24 mg/dL   Calcium 8.2 (L) 8.9 - 10.3 mg/dL   Total Protein 6.9 6.5 - 8.1 g/dL   Albumin 3.0 (L) 3.5 - 5.0 g/dL   AST 20 15 - 41 U/L   ALT 11 (L) 17 - 63 U/L   Alkaline Phosphatase 112 38 - 126 U/L   Total Bilirubin 1.0 0.3 - 1.2 mg/dL   GFR calc non Af Amer >60 >60 mL/min   GFR calc Af Amer >60 >60 mL/min    Comment: (NOTE) The eGFR has been calculated using the CKD EPI equation. This calculation has not been validated in all clinical situations. eGFR's persistently <60 mL/min signify possible Chronic Kidney Disease.    Anion gap 7 5 - 15  Magnesium     Status: None   Collection Time: 02/02/17  2:29 AM  Result Value Ref Range   Magnesium 2.0 1.7 - 2.4 mg/dL    ECG   N/A  Telemetry   Sinus rhythm with PAC's in the 70's - Personally Reviewed  Radiology    Ct Angio Chest Pe W Or Wo Contrast  Result Date:  02/01/2017 CLINICAL DATA:  Interstitial lung disease.  Fall. EXAM: CT ANGIOGRAPHY CHEST WITH CONTRAST TECHNIQUE: Multidetector CT imaging of the chest was performed using the standard protocol during bolus administration of intravenous contrast. Multiplanar CT image reconstructions and MIPs were obtained to evaluate the vascular anatomy. CONTRAST:  159m ISOVUE-370 IOPAMIDOL (ISOVUE-370) INJECTION 76% COMPARISON:  None. FINDINGS: Cardiovascular: Cardiomegaly. Reflux of contrast into the IVC and hepatic veins suggest right heart failure. Tortuosity of the thoracic aorta. No aneurysm. No filling defects in the pulmonary arteries to suggest pulmonary emboli. Mediastinum/Nodes: No mediastinal, hilar, or axillary adenopathy. Lungs/Pleura: Linear areas of subsegmental atelectasis in the lung bases. No effusions. Upper Abdomen: Imaging into the upper abdomen shows no acute findings. Musculoskeletal: Chest wall soft tissues are unremarkable. No acute bony abnormality. Numerous compression fractures throughout the thoracic spine, stable. Review of the MIP images confirms the above findings. IMPRESSION: Cardiomegaly. Reflux of contrast into the IVC and hepatic veins suggests right heart failure. Tortuosity of the thoracic aorta.  No aneurysm. No evidence of pulmonary embolus. Bibasilar atelectasis. Electronically Signed   By: KRolm BaptiseM.D.   On: 02/01/2017 13:59   Ct Pelvis Wo Contrast  Result Date: 02/14/2017 CLINICAL DATA:  Fall today with right hip pain. Acetabular fracture on radiograph. EXAM: CT PELVIS WITHOUT CONTRAST TECHNIQUE: Multidetector CT imaging of the pelvis was performed following the standard protocol without intravenous contrast. COMPARISON:  Radiographs earlier this day. FINDINGS: Urinary Tract: Irregular bladder wall thickening. Two stones in the right urinary trigone. Bowel:  Lower ventral abdominal wall hernia containing small bowel. Mild fecalization of bowel contents in the hernia, no bowel wall  thickening or free fluid. No evidence of obstruction. Vascular/Lymphatic: Aorto bi-iliac atherosclerosis. Small bilateral external iliac nodes. Reproductive:  Prostatic calcifications. Other: Right extraperitoneal stranding consistent secondary to pelvic fractures. Musculoskeletal: Comminuted right pelvic fracture. Comminuted displaced fracture involving the iliac wing extending to the anterior aspect of the sacroiliac joint. Equivocal minimal widening of the lower right sacroiliac joint. Distal fracture extent involves the acetabulum with minimal articular surface displacement. Fracture involves the anterior wall of the acetabulum at the puboacetabular junction. Nondisplaced fracture of the right inferior pubic ramus. Intramedullary rod in the right proximal femur with healed posttraumatic proximal femoral fracture. Intramedullary rod with trans trochanteric screws traverse the left proximal femur and remote intertrochanteric femur fracture. No additional left-sided acute pelvic fracture. Pubic symphysis is congruent with mild chondrocalcinosis. Advanced bony under mineralization. IMPRESSION: 1. Complex right pelvic fracture. Comminuted displaced fracture involves the right iliac wing extending from the anterior aspect of the sacroiliac joint to the anterior acetabulum at the puboacetabular junction. Minimal widening of the lower SI joint. Minimal articular up off of the acetabulum. There is nondisplaced right inferior pubic ramus fracture. 2. Associated extraperitoneal hemorrhage associated with pelvic fractures. 3. Bilateral proximal femoral hardware without periprosthetic fracture. 4. Irregular bladder wall thickening with small bladder stones. Recommend correlation with urinalysis. Bladder wall irregularity was seen on abdominal CT 01/25/2016 and appears chronic. 5.  Aortic Atherosclerosis (ICD10-I70.0). Electronically Signed   By: Jeb Levering M.D.   On: 02/25/2017 23:02   Portable Chest X-ray 1  View  Result Date: 02/01/2017 CLINICAL DATA:  Hypoxemia EXAM: PORTABLE CHEST 1 VIEW COMPARISON:  02/14/2017 FINDINGS: 0845 hours. The cardio pericardial silhouette is enlarged. The lungs are clear without focal pneumonia, edema, pneumothorax or pleural effusion. Interstitial markings are diffusely coarsened with chronic features. Bones are diffusely demineralized. Telemetry leads overlie the chest. IMPRESSION: Cardiomegaly with underlying chronic interstitial lung disease. Electronically Signed   By: Misty Stanley M.D.   On: 02/01/2017 09:08   Dg Chest Port 1 View  Result Date: 02/01/2017 CLINICAL DATA:  Respiratory difficulty EXAM: PORTABLE CHEST 1 VIEW COMPARISON:  01/25/2016 FINDINGS: Diffuse interstitial opacity. No pleural effusion. Mild cardiomegaly with aortic atherosclerosis. No definite pneumothorax. IMPRESSION: 1. Diffuse interstitial opacity could reflect mild interstitial edema on underlying chronic change. No focal pulmonary infiltrate 2. Mild cardiomegaly Electronically Signed   By: Donavan Foil M.D.   On: 02/01/2017 00:13   Dg Knee Complete 4 Views Right  Result Date: 02/16/2017 CLINICAL DATA:  Fall on concrete today.  Right hip and knee pain. EXAM: RIGHT KNEE - COMPLETE 4+ VIEW COMPARISON:  Femur radiograph 03/29/2014 FINDINGS: Intramedullary nail distal locking screw in the distal femur, partially included. There is diffuse medially oriented with rod approaching the medial cortex and associated chronic cortical thickening. No acute fracture of the knee. The alignment is maintained. Mild medial tibiofemoral joint space narrowing. No knee joint effusion. Advanced bony under mineralization. IMPRESSION: 1. No acute fracture of the right knee. 2. Intramedullary rod in the distal femur is partially included, appears medially oriented with chronic cortical thickening. Cortical thickening is likely new from prior exam, and could predispose to hardware failure. Electronically Signed   By: Jeb Levering M.D.   On: 02/04/2017 22:42   Dg Hip Unilat W Or Wo Pelvis 2-3 Views Right  Result Date: 03/02/2017 CLINICAL DATA:  Acute right hip pain following fall today. Initial encounter.  EXAM: DG HIP (WITH OR WITHOUT PELVIS) 2-3V RIGHT COMPARISON:  11/07/2014 radiographs FINDINGS: There is a fracture of the right acetabulum and right iliopubic ring. No dislocation identified. An intramedullary rod within the right femur are is again noted as well as a remote proximal right femur fracture. Left femur intramedullary rod and screw and remote proximal left femur fracture noted. IMPRESSION: Right acetabular and right iliopubic ring fracture. Consider CT for further evaluation. Electronically Signed   By: Margarette Canada M.D.   On: 02/14/2017 21:32    Cardiac Studies   N/A  Assessment   1. Principal Problem: 2.   Acute on chronic respiratory failure with hypoxia (HCC) 3. Active Problems: 4.   COPD with acute exacerbation ([place]) 5.   PAF (paroxysmal atrial fibrillation) ([place]) 6.   Atrial fibrillation with RVR ([place]) 7.   BPH (benign prostatic hyperplasia) 8.   Pelvic fracture (HCC) 9.   Hip fracture requiring operative repair, right, closed, initial encounter ([place]) 10.   Traumatic retroperitoneal hemorrhage 11.   Coronary artery calcification seen on CAT scan 12.   Abnormal nuclear stress test 13.   Plan   1. Seems to be declining somewhat - somnolent today on NRB at 10 L/min. Maintaining sinus on IV Amiodarone. Continue amiodarone IV until he can reliable take po. He is not considered a long-term anticoagulation candidate given recent falls in my opinion.   Time Spent Directly with Patient:  I have spent a total of 25 minutes with the patient reviewing hospital notes, telemetry, EKGs, labs and examining the patient as well as establishing an assessment and plan that was discussed personally with the patient. > 50% of time was spent in direct patient care.  Length of Stay:  LOS: 1 day    Pixie Casino, MD, [facility], [place] Director of the [facility]  [facility] Attending Cardiologist  Direct Dial: [phone]  Fax: [phone]  Website:  [email or web address] Alycen Mack 02/02/2017, 8:40 AM

## 2017-02-02 NOTE — Progress Notes (Signed)
Pharmacy Antibiotic Note  Neil MinksFarid Terry is a 83 y.o. male admitted on 02/05/2017 with pneumonia.  Pharmacy has been consulted for ceftazidime dosing.  Patient just intubated due to worsening breathing status. Tmax in 24 hrs was 100.6. WBC 15.9>17.9. LA 0.73. Renal function is stable at 0.88 (CrCl 49 mL/min).   Plan: Start ceftazidime 1 gram every 12 hours  Monitor clinical status, cx results, and renal function  Height: 5' 0.5" (153.7 cm) Weight: 153 lb 14.1 oz (69.8 kg) IBW/kg (Calculated) : 51.15  Temp (24hrs), Avg:99.1 F (37.3 C), Min:98 F (36.7 C), Max:100.6 F (38.1 C)  Recent Labs  Lab 02/13/2017 2134 02/01/17 0144 02/01/17 0155 02/01/17 1029 02/02/17 0229  WBC 15.9* 15.7*  --  12.0* 17.9*  CREATININE 0.82  --   --  0.92 0.88  LATICACIDVEN  --   --  0.73  --   --     Estimated Creatinine Clearance: 49 mL/min (by C-G formula based on SCr of 0.88 mg/dL).    No Known Allergies  Antimicrobials this admission: Ceftazidime 12/3 >>   Dose adjustments this admission: N/A  Microbiology results: 12/3 Sputum: ordered  12/2 MRSA PCR: invalid results  Thank you for allowing pharmacy to be a part of this patient's care.  Girard CooterKimberly Perkins, PharmD Clinical Pharmacist  Pager: [phone] Clinical Phone for 02/02/2017 until 3:30pm: [phone] If after 3:30pm, please call main pharmacy at [phone] 02/02/2017 11:25 AM

## 2017-02-02 NOTE — Progress Notes (Signed)
Critical ABG values given to Dr. Tyson [name].

## 2017-02-02 NOTE — Consult Note (Signed)
Orthopaedic Trauma Service (OTS) Consult   Patient ID: Neil Terry [place] MRN: [id number] DOB/AGE: Jul 01, 1927 83 y.o.  Reason for Consult: Right acetabular fracture Referring Physician: Dr. Lajoyce CornersMatt Olin, MD [facility]  HPI: Neil Terry Solano is an 83 y.o. male who is being seen in consultation at the request of Dr. Charlann Boxerlin for evaluation of right acetabular fracture.  The patient has had multiple falls over the last few weeks and months.  Fall Saturday immediate pain and inability to bear weight.  He was transferred to the hospital strays and CT scan showed a right acetabular fracture.  Orthopedics was consulted.  Dr. Charlann Boxerlin was the orthopedics on-call requested that I review his films and see the patient due to the complexity of his fracture.  Upon exam the patient is non-arousable to the nursing staff he does not speak [place].  He has no family at bedside during my examination.  He has been in A. fib and was converted with amiodarone he also has increased pulmonary requirements and is currently on a nonrebreather.  Past Medical History:  Diagnosis Date  . BPH (benign prostatic hypertrophy)   . Bronchial asthma   . COPD (chronic obstructive pulmonary disease) (HCC)   . Depression    hx  . Duodenal ulcer    perforated ~ 2013  . GERD (gastroesophageal reflux disease)   . HTN (hypertension)   . On home oxygen therapy    "8h/day; don't know how many liters"    Past Surgical History:  Procedure Laterality Date  . ABDOMINAL EXPLORATION SURGERY  02/23/2012  . CATARACT EXTRACTION, BILATERAL    . FRACTURE SURGERY    . GASTRIC ROUX-EN-Y  ~ 1226/2013   closure of duodenal stump w/Roux-en-Y gastro jejunalanastomosis  . HIP FRACTURE SURGERY Right 2000's  . HIP HARDWARE REMOVAL Right ~ 2011  . PARTIAL GASTRECTOMY  2013-2014   distal; w/closure of duodenal stump; gastro jejunalanastomosis  . TRANSURETHRAL RESECTION OF PROSTATE  2016    Family History  Problem Relation Age of Onset  .  Arrhythmia Son   . Stroke Brother   . Other Mother        Died of old age  . Asthma Father     Social History:  reports that he has quit smoking. His smoking use included cigarettes. He has a 150.00 pack-year smoking history. he has never used smokeless tobacco. He reports that he does not drink alcohol or use drugs.  Allergies: No Known Allergies  Medications:  No current facility-administered medications on file prior to encounter.    Current Outpatient Medications on File Prior to Encounter  Medication Sig Dispense Refill  . acetaminophen (TYLENOL) 500 MG tablet Take 500 mg by mouth 2 (two) times daily.    Marland Kitchen. albuterol (PROVENTIL HFA;VENTOLIN HFA) 108 (90 Base) MCG/ACT inhaler Inhale 2 puffs into the lungs every 6 (six) hours as needed for wheezing or shortness of breath. 1 Inhaler 0  . aspirin 81 MG chewable tablet Chew by mouth daily.    . budesonide-formoterol (SYMBICORT) 80-4.5 MCG/ACT inhaler Inhale 2 puffs into the lungs 2 (two) times daily.    . busPIRone (BUSPAR) 7.5 MG tablet Take 7.5 mg by mouth at bedtime as needed.    Marland Kitchen. esomeprazole (NEXIUM) 20 MG capsule Take 20 mg by mouth daily at 12 noon.    Marland Kitchen. ipratropium-albuterol (DUONEB) 0.5-2.5 (3) MG/3ML SOLN Take 3 mLs by nebulization every 4 (four) hours as needed.     . potassium chloride (K-DUR) 10 MEQ tablet Take 10 mEq  by mouth daily.    . pramipexole (MIRAPEX) 0.25 MG tablet Take 0.25 mg by mouth at bedtime.    . senna-docusate (SENOKOT-S) 8.6-50 MG tablet Take 1 tablet by mouth daily.    . tamsulosin (FLOMAX) 0.4 MG CAPS capsule Take 0.4 mg by mouth.    Marland Kitchen amoxicillin-clavulanate (AUGMENTIN) 500-125 MG tablet Take 1 tablet by mouth 2 (two) times daily.    . Cholecalciferol 1000 units tablet Take 2,000 Units by mouth daily.    Marland Kitchen doxycycline (VIBRAMYCIN) 100 MG capsule Take 100 mg by mouth 2 (two) times daily.    . furosemide (LASIX) 20 MG tablet Take 20 mg by mouth.    . gabapentin (NEURONTIN) 300 MG capsule Take 300 mg by  mouth 3 (three) times daily.    Marland Kitchen guaiFENesin (MUCINEX) 600 MG 12 hr tablet Take by mouth 2 (two) times daily.    . metoprolol tartrate (LOPRESSOR) 25 MG tablet Take 25 mg by mouth once.    . polyethylene glycol (MIRALAX / GLYCOLAX) packet Take 17 g by mouth daily. (Patient taking differently: Take 17 g by mouth at bedtime. ) 14 each 0  . sertraline (ZOLOFT) 50 MG tablet Take 50 mg by mouth at bedtime.      ROS: Unable to obtain due to patient's mental status.   Exam: Blood pressure (!) 95/50, pulse 70, temperature 98.7 F (37.1 C), temperature source Axillary, resp. rate (!) 22, height 5' 0.5" (1.537 m), weight 69.8 kg (153 lb 14.1 oz), SpO2 100 %. General: Asleep on a nonrebreather. Orientation: Does not awake to exam Mood and Affect: Currently does not awake Gait: Unable to be assessed Coordination and balance: Not able to be assessed  Right lower extremity: Examination shows no significant ecchymosis or bruising.  He does have some grimacing pain with attempted range of motion of the hip.  No obvious deformities about the thigh knee or ankle.  Passive range of motion is intact without any signs of discomfort.  Compartments are soft and compressible.  Motor and sensory exam is unable be performed secondary to patient's mental status.  Left lower extremity: skin without lesions. No tenderness to palpation. Full painless ROM, no instability.  Motor and sensory exam is unable to be performed secondary to patient's mental status   Medical Decision Making: Imaging: AP pelvis and CT scan are reviewed which show a mildly displaced both column acetabular fracture.  There is secondary congruence of the articular surface of the acetabulum to the femoral head.  There is a slight amount of medialization of the acetabulum itself.  He has a significantly poor bone stock.  Labs:  CBC    Component Value Date/Time   WBC 17.9 (H) 02/02/2017 0229   RBC 4.74 02/02/2017 0229   HGB 12.5 (L) 02/02/2017  0229   HCT 42.1 02/02/2017 0229   PLT 142 (L) 02/02/2017 0229   MCV 88.8 02/02/2017 0229   MCH 26.4 02/02/2017 0229   MCHC 29.7 (L) 02/02/2017 0229   RDW 14.7 02/02/2017 0229   LYMPHSABS 0.8 02/01/2017 1029   MONOABS 0.7 02/01/2017 1029   EOSABS 0.4 02/01/2017 1029   BASOSABS 0.0 02/01/2017 1029   Medical history and chart was reviewed  Assessment/Plan: 83 year old male with multiple falls and history of COPD currently in A. fib with a mildly displaced right both column acetabular fracture  -I feel that his fracture is amenable to nonoperative treatment.  He has secondary congruence of his acetabulum around his femoral head.  I feel that  surgical approach would be high risk and minimal benefit. -Recommend getting AP pelvis with Judet views -Will recommend touchdown weightbearing to the right lower extremity once able to mobilize with physical therapy.  Roby LoftsKevin P. Axxel Gude, MD [facility] [phone] (phone)

## 2017-02-02 NOTE — Procedures (Signed)
Intubation Procedure Note Rashod Gougeon [id number] [birth year]  Procedure: Intubation Indications: Respiratory insufficiency  Procedure Details Consent: Risks of procedure as well as the alternatives and risks of each were explained to the (patient/caregiver).  Consent for procedure obtained. Time Out: Verified patient identification, verified procedure, site/side was marked, verified correct patient position, special equipment/implants available, medications/allergies/relevent history reviewed, required imaging and test results available.  Performed  Maximum sterile technique was used including antiseptics, cap, gloves, gown, hand hygiene and mask.  MAC and 3    Evaluation Hemodynamic Status: BP stable throughout; O2 sats: stable throughout Patient's Current Condition: stable Complications: No apparent complications Patient did tolerate procedure well. Chest X-ray ordered to verify placement.  CXR: pending.   Raylene Miyamoto 02/02/2017  [place]

## 2017-02-02 NOTE — Progress Notes (Signed)
Initial Nutrition Assessment  DOCUMENTATION CODES:   Non-severe (moderate) malnutrition in context of chronic illness  INTERVENTION:   Tube Feeding:   Vital AF 1.2 @ 60 ml/hr Provides 1728 kcals, 108 g of protein and 1166 mL of free water   NUTRITION DIAGNOSIS:   Moderate Malnutrition related to chronic illness(COPD) as evidenced by mild fat depletion, mild muscle depletion.  GOAL:   Patient will meet greater than or equal to 90% of their needs  MONITOR:   TF tolerance, Vent status, Labs, Weight trends  REASON FOR ASSESSMENT:   Consult Hip fracture protocol, Enteral/tube feeding initiation and management  ASSESSMENT:   83 yo male admitted after fall at home with pelvic and hip fracture with retroperitoneal hemorrhage, acute on chronic respiratory failure. Pt with hx of COPD with chronic respiratory failure, abdominal hernias, hx of partial gastrectomy, perforated duodenal ulcer  Lethargic, worsening respiratory status requiring intubation this AM, OG tube placed with tip in stomach  Pt unable to provide diet/weight history. Family is at bedside. Family reports pt with very good appetite PTA. Report pt eats 3 meals per day. Family unsure if pt has lost weight or not recently but report that pt does not look like he has lost weight. Per weight encounters, pt with 6% wt loss since February (which is not significant for time frame). Pt height corrected in computer (report height of 5 feet 6 inches, current height 5 feet 0.5 inches).  Patient is currently intubated on ventilator support MV: 10.9 L/min Temp (24hrs), Avg:99 F (37.2 C), Min:98 F (36.7 C), Max:100.6 F (38.1 C)  Labs: reviewed Meds: lasix, KCl  NUTRITION - FOCUSED PHYSICAL EXAM:    Most Recent Value  Orbital Region  Mild depletion  Upper Arm Region  Moderate depletion  Thoracic and Lumbar Region  No depletion  Buccal Region  Unable to assess  Temple Region  Moderate depletion  Clavicle Bone Region   Moderate depletion  Clavicle and Acromion Bone Region  Moderate depletion  Scapular Bone Region  Moderate depletion  Dorsal Hand  Unable to assess  Patellar Region  Mild depletion  Anterior Thigh Region  Mild depletion  Posterior Calf Region  Mild depletion  Edema (RD Assessment)  Mild  Hair  Reviewed  Eyes  Unable to assess  Mouth  Unable to assess  Skin  Reviewed  Nails  Unable to assess       Diet Order:  Diet regular Room service appropriate? Yes; Fluid consistency: Thin  EDUCATION NEEDS:   Not appropriate for education at this time  Skin:  Skin Assessment: Reviewed RN Assessment  Last BM:  no documented BM  Height:   Ht Readings from Last 1 Encounters:  02/02/17 5\' 6"  (1.676 m)    Weight:   Wt Readings from Last 1 Encounters:  02/02/17 153 lb 14.1 oz (69.8 kg)    Ideal Body Weight:     BMI:  Body mass index is 24.84 kg/m.  Estimated Nutritional Needs:   Kcal:  1757 kcals  Protein:  105-140 g   Fluid:  >/= 1.7 L   Romelle Starcherate Glendia Olshefski MS, RD, LDN, CNSC [phone] Pager  [phone]) [phone] Weekend/On-Call Pager

## 2017-02-03 ENCOUNTER — Inpatient Hospital Stay (HOSPITAL_COMMUNITY): Payer: Medicare Other

## 2017-02-03 DIAGNOSIS — S72001A Fracture of unspecified part of neck of right femur, initial encounter for closed fracture: Secondary | ICD-10-CM

## 2017-02-03 LAB — CBC WITH DIFFERENTIAL/PLATELET
BASOS ABS: 0 10*3/uL (ref 0.0–0.1)
Basophils Relative: 0 %
EOS PCT: 0 %
Eosinophils Absolute: 0 10*3/uL (ref 0.0–0.7)
HCT: 34.2 % — ABNORMAL LOW (ref 39.0–52.0)
Hemoglobin: 10.4 g/dL — ABNORMAL LOW (ref 13.0–17.0)
LYMPHS ABS: 0.4 10*3/uL — AB (ref 0.7–4.0)
LYMPHS PCT: 3 %
MCH: 26.2 pg (ref 26.0–34.0)
MCHC: 30.4 g/dL (ref 30.0–36.0)
MCV: 86.1 fL (ref 78.0–100.0)
MONO ABS: 0.2 10*3/uL (ref 0.1–1.0)
MONOS PCT: 1 %
Neutro Abs: 15.3 10*3/uL — ABNORMAL HIGH (ref 1.7–7.7)
Neutrophils Relative %: 96 %
PLATELETS: 126 10*3/uL — AB (ref 150–400)
RBC: 3.97 MIL/uL — ABNORMAL LOW (ref 4.22–5.81)
RDW: 14.7 % (ref 11.5–15.5)
WBC: 15.9 10*3/uL — ABNORMAL HIGH (ref 4.0–10.5)

## 2017-02-03 LAB — GLUCOSE, CAPILLARY
GLUCOSE-CAPILLARY: 146 mg/dL — AB (ref 65–99)
GLUCOSE-CAPILLARY: 154 mg/dL — AB (ref 65–99)
Glucose-Capillary: 114 mg/dL — ABNORMAL HIGH (ref 65–99)
Glucose-Capillary: 125 mg/dL — ABNORMAL HIGH (ref 65–99)
Glucose-Capillary: 137 mg/dL — ABNORMAL HIGH (ref 65–99)

## 2017-02-03 LAB — COMPREHENSIVE METABOLIC PANEL
ALBUMIN: 2.6 g/dL — AB (ref 3.5–5.0)
ALT: 12 U/L — ABNORMAL LOW (ref 17–63)
AST: 30 U/L (ref 15–41)
Alkaline Phosphatase: 88 U/L (ref 38–126)
Anion gap: 10 (ref 5–15)
BILIRUBIN TOTAL: 0.8 mg/dL (ref 0.3–1.2)
BUN: 40 mg/dL — AB (ref 6–20)
CO2: 29 mmol/L (ref 22–32)
Calcium: 8 mg/dL — ABNORMAL LOW (ref 8.9–10.3)
Chloride: 95 mmol/L — ABNORMAL LOW (ref 101–111)
Creatinine, Ser: 1.3 mg/dL — ABNORMAL HIGH (ref 0.61–1.24)
GFR calc Af Amer: 55 mL/min — ABNORMAL LOW (ref >60)
GFR calc non Af Amer: 48 mL/min — ABNORMAL LOW (ref >60)
GLUCOSE: 170 mg/dL — AB (ref 65–99)
POTASSIUM: 4 mmol/L (ref 3.5–5.1)
Sodium: 134 mmol/L — ABNORMAL LOW (ref 135–145)
TOTAL PROTEIN: 6.3 g/dL — AB (ref 6.5–8.1)

## 2017-02-03 LAB — POCT I-STAT 3, ART BLOOD GAS (G3+)
ACID-BASE EXCESS: 3 mmol/L — AB (ref 0.0–2.0)
Bicarbonate: 30.1 mmol/L — ABNORMAL HIGH (ref 20.0–28.0)
O2 SAT: 95 %
TCO2: 32 mmol/L (ref 22–32)
pCO2 arterial: 56.8 mmHg — ABNORMAL HIGH (ref 32.0–48.0)
pH, Arterial: 7.332 — ABNORMAL LOW (ref 7.350–7.450)
pO2, Arterial: 85 mmHg (ref 83.0–108.0)

## 2017-02-03 LAB — MAGNESIUM
Magnesium: 1.9 mg/dL (ref 1.7–2.4)
Magnesium: 2.1 mg/dL (ref 1.7–2.4)

## 2017-02-03 LAB — PHOSPHORUS
Phosphorus: 1.5 mg/dL — ABNORMAL LOW (ref 2.5–4.6)
Phosphorus: 2.8 mg/dL (ref 2.5–4.6)

## 2017-02-03 LAB — SEDIMENTATION RATE: Sed Rate: 57 mm/hr — ABNORMAL HIGH (ref 0–16)

## 2017-02-03 MED ORDER — SODIUM CHLORIDE 0.9 % IV SOLN
INTRAVENOUS | Status: DC
  Administered 2017-02-03 – 2017-02-04 (×3): via INTRAVENOUS
  Administered 2017-02-09: 250 mL via INTRAVENOUS

## 2017-02-03 MED ORDER — DIGOXIN 0.25 MG/ML IJ SOLN
0.5000 mg | Freq: Once | INTRAMUSCULAR | Status: AC
  Administered 2017-02-03: 0.5 mg via INTRAVENOUS
  Filled 2017-02-03: qty 2

## 2017-02-03 MED ORDER — DEXTROSE 5 % IV SOLN
20.0000 mmol | Freq: Once | INTRAVENOUS | Status: AC
  Administered 2017-02-03: 20 mmol via INTRAVENOUS
  Filled 2017-02-03: qty 6.67

## 2017-02-03 MED ORDER — CHLORHEXIDINE GLUCONATE 0.12% ORAL RINSE (MEDLINE KIT)
15.0000 mL | Freq: Two times a day (BID) | OROMUCOSAL | Status: DC
  Administered 2017-02-03 – 2017-02-04 (×2): 15 mL via OROMUCOSAL

## 2017-02-03 MED ORDER — POTASSIUM CHLORIDE 20 MEQ/15ML (10%) PO SOLN
10.0000 meq | Freq: Every day | ORAL | Status: DC
  Administered 2017-02-03 – 2017-02-12 (×10): 10 meq via ORAL
  Filled 2017-02-03 (×11): qty 15

## 2017-02-03 MED ORDER — DIGOXIN 0.25 MG/ML IJ SOLN
0.2500 mg | Freq: Once | INTRAMUSCULAR | Status: AC
  Administered 2017-02-03: 0.25 mg via INTRAVENOUS
  Filled 2017-02-03: qty 2

## 2017-02-03 MED ORDER — PANTOPRAZOLE SODIUM 40 MG PO PACK
40.0000 mg | PACK | Freq: Every day | ORAL | Status: DC
  Administered 2017-02-03 – 2017-02-12 (×10): 40 mg
  Filled 2017-02-03 (×10): qty 20

## 2017-02-03 MED ORDER — DIGOXIN 125 MCG PO TABS
0.1250 mg | ORAL_TABLET | Freq: Every day | ORAL | Status: DC
  Administered 2017-02-04 – 2017-02-11 (×8): 0.125 mg via ORAL
  Filled 2017-02-03 (×9): qty 1

## 2017-02-03 MED ORDER — ORAL CARE MOUTH RINSE
15.0000 mL | Freq: Four times a day (QID) | OROMUCOSAL | Status: DC
  Administered 2017-02-03 – 2017-02-04 (×2): 15 mL via OROMUCOSAL

## 2017-02-03 NOTE — Consult Note (Signed)
PULMONARY / CRITICAL CARE MEDICINE   Name: Neil Terry MRN: [id number] DOB: [birth year]    ADMISSION DATE:  02/28/2017 CONSULTATION DATE:  02/01/2017  REFERRING MD:  Neil Terry  CHIEF COMPLAINT: We are asked to consult for increasing oxygen requirement and atrial fibrillation with rapid ventricular response  HISTORY OF PRESENT ILLNESS:   This is an 83 year old with oxygen dependent COPD at baseline who suffered from a ground-level fall yesterday.  Since admission he has developed increasing oxygen requirements and is developed atrial fibrillation with rapid ventricular response.  An amiodarone infusion was initiated this morning and he is already responded with a heart rate in the 80s during my examination.  He denies any increase in his usual cough or any subjective increase in dyspnea.  He is not having any sort of chest pain including including pleuritic chest pain.   SUBJECTIVE:  Wean  desat  VITAL SIGNS: BP 111/73   Pulse 95   Temp 98.5 F (36.9 C) (Oral)   Resp (!) 24   Ht _0  (1.676 m)   Wt 72.3 kg (159 lb 6.3 oz)   SpO2 100%   BMI 25.73 kg/m     VENTILATOR SETTINGS: Vent Mode: PRVC FiO2 (%):  [40 %-100 %] 40 % Set Rate:  [24 bmp] 24 bmp Vt Set:  [410 mL-510 mL] 510 mL PEEP:  [5 cmH20] 5 cmH20 Plateau Pressure:  [22 cmH20-27 cmH20] 22 cmH20  INTAKE / OUTPUT: I/O last 3 completed shifts: In: 2627.8 [I.V.:1528.8; NG/GT:1049; IV Piggyback:50] Out: 1610 [Urine:1610]  PHYSICAL EXAMINATION: .General: rass -1 Neuro: follows commands, moves ext HEENT: jvd wnl PULM: CTA ant CV:  s1 s2 irr GI: soft, BS wnl, no r Extremities: swollen rt hip, min edema   LABS:  BMET Recent Labs  Lab 02/01/17 1029 02/02/17 0229 02/03/17 0201  NA 134* 135 134*  K 4.4 4.8 4.0  CL 94* 96* 95*  CO2 32 32 29  BUN 20 19 40*  CREATININE 0.92 0.88 1.30*  GLUCOSE 120* 112* 170*    Electrolytes Recent Labs  Lab 02/01/17 1029 02/02/17 0229 02/02/17 1122  02/02/17 1620 02/03/17 0201  CALCIUM 8.2* 8.2*  --   --  8.0*  MG 2.0 2.0 1.9 1.7 1.9  PHOS  --   --  4.8* 1.8* 1.5*    CBC Recent Labs  Lab 02/01/17 1029 02/02/17 0229 02/03/17 0201  WBC 12.0* 17.9* 15.9*  HGB 11.9* 12.5* 10.4*  HCT 38.8* 42.1 34.2*  PLT 147* 142* 126*    Coag's Recent Labs  Lab 02/04/2017 2334 02/01/17 1029 02/02/17 0229  APTT  --  35  --   INR 1.17 1.19 1.21    Sepsis Markers Recent Labs  Lab 02/01/17 0155  LATICACIDVEN 0.73    ABG Recent Labs  Lab 02/02/17 0942 02/02/17 1212 02/02/17 1659  PHART 7.208* 7.222* 7.337*  PCO2ART 87.7* 72.1* 50.8*  PO2ART 71.0* 190.0* 63.0*    Liver Enzymes Recent Labs  Lab 02/02/17 0229 02/03/17 0201  AST 20 30  ALT 11* 12*  ALKPHOS 112 88  BILITOT 1.0 0.8  ALBUMIN 3.0* 2.6*    Cardiac Enzymes Recent Labs  Lab 02/01/17 0144 02/01/17 1029  TROPONINI <0.03 <0.03    Glucose Recent Labs  Lab 02/02/17 1219 02/02/17 1649 02/02/17 2034 02/02/17 2330 02/03/17 0325  GLUCAP 104* 160* 171* 164* 146*    Imaging Dg Pelvis Comp Min 3v  Result Date: 02/02/2017 CLINICAL DATA:  Pelvic fractures. EXAM: JUDET PELVIS - 3+ VIEW COMPARISON:  CT scan dated 02/25/2017 and radiographs dated 02/05/2017 FINDINGS: Again noted are multiple fractures of the right ilium with slight displacement noted on the Judet views. No significant displacement of the acetabular components of the fracture. Healed bilateral proximal femoral fractures with hardware in place. IMPRESSION: No appreciable change in alignment or position of the extensive right pelvic fractures. Electronically Signed   By: Lorriane Shire M.D.   On: 02/02/2017 11:20   Dg Chest Port 1 View  Result Date: 02/02/2017 CLINICAL DATA:  Acute respiratory failure. EXAM: PORTABLE CHEST 1 VIEW COMPARISON:  02/01/2017 and 02/13/2017 FINDINGS: There is no opacification at the left lung base which could represent a moderate left effusion or consolidation in the left  lower lobe. There is slight haziness in the left perihilar region extending into the left upper lobe. The right lung is clear. Multiple old right rib fractures and thoracic spine fractures. Multiple lumbar fractures treated with vertebroplasty. Posttraumatic and arthritic changes of proximal right humerus. Endotracheal tube and NG tube appear in good position. Aortic atherosclerosis. Small metallic foreign body in the posterior aspect of the fundus of the stomach, not present on the prior CT scan of 01/25/2016. IMPRESSION: 1. New opacification at the left lung base consistent with either moderate joint effusion or consolidation in the left lower lobe. 2. New faint left perihilar infiltrate extending into the left upper lobe. 3. Endotracheal tube and NG tube in good position. 4. Small metallic foreign body in the stomach of unknown etiology. Electronically Signed   By: Lorriane Shire M.D.   On: 02/02/2017 11:16   Dg Abd Portable 1v  Result Date: 02/02/2017 CLINICAL DATA:  O G-tube placement EXAM: PORTABLE ABDOMEN - 1 VIEW COMPARISON:  01/26/2016 FINDINGS: Enteric tube tip is in the body of the stomach with side port well below GE junction. Adjacent indeterminate radiodensity projecting over the left upper quadrant of the abdomen measures 7 mm. The bowel gas pattern appears nonobstructed. A moderate stool burden noted throughout the colon. IMPRESSION: 1. Enteric tube tip is in the body of the stomach. Electronically Signed   By: Kerby Moors M.D.   On: 02/02/2017 11:07     STUDIES:  CTA 12/2- copd, no pe    DISCUSSION: This is an 83 year old with O2 dependent COPD at baseline who is suffered from a fall with a subsequent right pelvic fracture.  We are asked to see the patient because of increasing oxygen requirement, and atrial fibrillation with rapid ventricular response.  There is no overt provocation by history exam or x-ray for an increased oxygen requirement and if his creatinine returns normal I  think a CTA as you have ordered is prudent.  Pertinent to the A. fib he has already responded to amiodarone I have no other suggested intervention other than to ensure adequate pain control which I was suspect was the driver of his arrhythmia.  Serial enzymes have been ordered and electrolytes are pending.  ASSESSMENT / PLAN:  COPD with exacerbation Acute resp failure ATX  Assess for asp PNA/ hcap Fib rvr with diastolic CHF ARF, over diuresis? hypophos  -some concern increased infiltrate on left, maintain abx, remains culture neg -he requires slight elevated Tv , re measure ht, keep at current TV 500, rate 24, repeta abg to esnure no alkalosis -wean attempt repeat, cpap 5 ps5-10, goal 2 hours -maintain steroids, Bders, likely to reduce steroids in am  -no wheezing this am  -upright position -WUA on fent, interrupt, assess pain  -crt rise, dc  all lasix, consider some volume back, add saline, chem in am  -some concern with change in pcxr for amio, consider dc, esr 57 also, follow with amio off -suppphos, goal greater 2 with weaning -control rate afib, no anticoagulation with hip fx risk bleeding for now -feeding, monitor BM -in am , may consider narrow ABX if remains culture neg -was neg 847, goal is even balance to pos 500-1 liter -dc buspar for now  Son participated on rounds  Ccm time 35 min   Lavon Paganini. Titus Mould, MD, [place] Pgr: [facility]

## 2017-02-03 NOTE — Progress Notes (Addendum)
DAILY PROGRESS NOTE   Patient Name: Nuh Lipton Date of Encounter: 02/03/2017  Chief Complaint   Intubated, sedated on vent  Patient Profile   Jonathon Tan is a 83 y.o. male with a hx of atrial fibrillation who is being seen today for the evaluation of atrial fibrillation w RVR at the request of Dr. Evangeline Gula.  Subjective   Worsening respiratory status yesterday, required emergent intubation. Now sedated on vent. Noted to be in and out of a-fib on the monitor. Opens eyes to voice today - in mitts.  Objective   Vitals:   02/03/17 0400 02/03/17 0401 02/03/17 0720 02/03/17 0810  BP: 111/73  111/73   Pulse: (!) 25  95   Resp: (!) 24  (!) 24   Temp: 98.4 F (36.9 C)   98.5 F (36.9 C)  TempSrc: Axillary   Oral  SpO2: 93%  100%   Weight:  159 lb 6.3 oz (72.3 kg)    Height:        Intake/Output Summary (Last 24 hours) at 02/03/2017 0834 Last data filed at 02/03/2017 0600 Gross per 24 hour  Intake 2034 ml  Output 1200 ml  Net 834 ml   Filed Weights   02/15/2017 2020 02/02/17 0348 02/03/17 0401  Weight: 162 lb (73.5 kg) 153 lb 14.1 oz (69.8 kg) 159 lb 6.3 oz (72.3 kg)    Physical Exam   General appearance: intubated, sedated on vent, opens eyes to voice Neck: no carotid bruit, no JVD and thyroid not enlarged, symmetric, no tenderness/mass/nodules Lungs: diminished breath sounds bilaterally and rales bibasilar Heart: regular rate and rhythm and S1, S2 normal Abdomen: soft, non-tender; bowel sounds normal; no masses,  no organomegaly Extremities: extremities normal, atraumatic, no cyanosis or edema Pulses: 2+ and symmetric Skin: Skin color, texture, turgor normal. No rashes or lesions Neurologic: Mental status: intubated, lightly sedated on vent, opens eyes to voice Psych: Cannot assess  Inpatient Medications    Scheduled Meds: . acetaminophen  500 mg Oral BID  . cholecalciferol  2,000 Units Oral Daily  . fentaNYL (SUBLIMAZE) injection  50 mcg Intravenous Once  .  furosemide  40 mg Intravenous Q12H  . gabapentin  300 mg Oral TID  . insulin aspart  0-9 Units Subcutaneous Q4H  . ipratropium  0.5 mg Nebulization TID  . levalbuterol  0.63 mg Nebulization TID  . methylPREDNISolone (SOLU-MEDROL) injection  40 mg Intravenous Q12H  . midazolam  2 mg Intravenous Once  . mometasone-formoterol  2 puff Inhalation BID  . pantoprazole  40 mg Oral Daily  . polyethylene glycol  17 g Oral Daily  . potassium chloride  10 mEq Oral Daily  . pramipexole  0.25 mg Oral QHS  . senna-docusate  1 tablet Oral Daily  . sertraline  50 mg Oral QHS  . tamsulosin  0.4 mg Oral QPC supper    Continuous Infusions: . amiodarone 30 mg/hr (02/03/17 5277)  . cefTAZidime (FORTAZ)  IV Stopped (02/03/17 0329)  . feeding supplement (VITAL AF 1.2 CAL) 1,000 mL (02/03/17 0000)  . fentaNYL infusion INTRAVENOUS 150 mcg/hr (02/03/17 0750)    PRN Meds: busPIRone, fentaNYL, fentaNYL (SUBLIMAZE) injection, traMADol   Labs   Results for orders placed or performed during the hospital encounter of 02/27/2017 (from the past 48 hour(s))  Basic metabolic panel     Status: Abnormal   Collection Time: 02/01/17 10:29 AM  Result Value Ref Range   Sodium 134 (L) 135 - 145 mmol/L   Potassium 4.4 3.5 -  5.1 mmol/L   Chloride 94 (L) 101 - 111 mmol/L   CO2 32 22 - 32 mmol/L   Glucose, Bld 120 (H) 65 - 99 mg/dL   BUN 20 6 - 20 mg/dL   Creatinine, Ser 0.92 0.61 - 1.24 mg/dL   Calcium 8.2 (L) 8.9 - 10.3 mg/dL   GFR calc non Af Amer >60 >60 mL/min   GFR calc Af Amer >60 >60 mL/min    Comment: (NOTE) The eGFR has been calculated using the CKD EPI equation. This calculation has not been validated in all clinical situations. eGFR's persistently <60 mL/min signify possible Chronic Kidney Disease.    Anion gap 8 5 - 15  Magnesium     Status: None   Collection Time: 02/01/17 10:29 AM  Result Value Ref Range   Magnesium 2.0 1.7 - 2.4 mg/dL  CBC WITH DIFFERENTIAL     Status: Abnormal   Collection  Time: 02/01/17 10:29 AM  Result Value Ref Range   WBC 12.0 (H) 4.0 - 10.5 K/uL   RBC 4.46 4.22 - 5.81 MIL/uL   Hemoglobin 11.9 (L) 13.0 - 17.0 g/dL   HCT 38.8 (L) 39.0 - 52.0 %   MCV 87.0 78.0 - 100.0 fL   MCH 26.7 26.0 - 34.0 pg   MCHC 30.7 30.0 - 36.0 g/dL   RDW 14.5 11.5 - 15.5 %   Platelets 147 (L) 150 - 400 K/uL   Neutrophils Relative % 84 %   Neutro Abs 10.1 (H) 1.7 - 7.7 K/uL   Lymphocytes Relative 7 %   Lymphs Abs 0.8 0.7 - 4.0 K/uL   Monocytes Relative 6 %   Monocytes Absolute 0.7 0.1 - 1.0 K/uL   Eosinophils Relative 3 %   Eosinophils Absolute 0.4 0.0 - 0.7 K/uL   Basophils Relative 0 %   Basophils Absolute 0.0 0.0 - 0.1 K/uL  Protime-INR     Status: None   Collection Time: 02/01/17 10:29 AM  Result Value Ref Range   Prothrombin Time 15.0 11.4 - 15.2 seconds   INR 1.19   APTT     Status: None   Collection Time: 02/01/17 10:29 AM  Result Value Ref Range   aPTT 35 24 - 36 seconds  Troponin I     Status: None   Collection Time: 02/01/17 10:29 AM  Result Value Ref Range   Troponin I <0.03 <0.03 ng/mL  TSH     Status: None   Collection Time: 02/01/17 10:29 AM  Result Value Ref Range   TSH 1.438 0.350 - 4.500 uIU/mL    Comment: Performed by a 3rd Generation assay with a functional sensitivity of <=0.01 uIU/mL.  I-STAT 3, arterial blood gas (G3+)     Status: Abnormal   Collection Time: 02/01/17 11:21 AM  Result Value Ref Range   pH, Arterial 7.345 (L) 7.350 - 7.450   pCO2 arterial 61.2 (H) 32.0 - 48.0 mmHg   pO2, Arterial 71.0 (L) 83.0 - 108.0 mmHg   Bicarbonate 33.4 (H) 20.0 - 28.0 mmol/L   TCO2 35 (H) 22 - 32 mmol/L   O2 Saturation 93.0 %   Acid-Base Excess 6.0 (H) 0.0 - 2.0 mmol/L   Patient temperature 98.3 F    Collection site RADIAL, ALLEN'S TEST ACCEPTABLE    Drawn by RT    Sample type ARTERIAL   Brain natriuretic peptide     Status: Abnormal   Collection Time: 02/01/17  6:44 PM  Result Value Ref Range   B Natriuretic Peptide  217.4 (H) 0.0 - 100.0  pg/mL  CBC     Status: Abnormal   Collection Time: 02/02/17  2:29 AM  Result Value Ref Range   WBC 17.9 (H) 4.0 - 10.5 K/uL   RBC 4.74 4.22 - 5.81 MIL/uL   Hemoglobin 12.5 (L) 13.0 - 17.0 g/dL   HCT 42.1 39.0 - 52.0 %   MCV 88.8 78.0 - 100.0 fL   MCH 26.4 26.0 - 34.0 pg   MCHC 29.7 (L) 30.0 - 36.0 g/dL   RDW 14.7 11.5 - 15.5 %   Platelets 142 (L) 150 - 400 K/uL  Protime-INR     Status: Abnormal   Collection Time: 02/02/17  2:29 AM  Result Value Ref Range   Prothrombin Time 15.3 (H) 11.4 - 15.2 seconds   INR 1.21   Comprehensive metabolic panel     Status: Abnormal   Collection Time: 02/02/17  2:29 AM  Result Value Ref Range   Sodium 135 135 - 145 mmol/L   Potassium 4.8 3.5 - 5.1 mmol/L   Chloride 96 (L) 101 - 111 mmol/L   CO2 32 22 - 32 mmol/L   Glucose, Bld 112 (H) 65 - 99 mg/dL   BUN 19 6 - 20 mg/dL   Creatinine, Ser 0.88 0.61 - 1.24 mg/dL   Calcium 8.2 (L) 8.9 - 10.3 mg/dL   Total Protein 6.9 6.5 - 8.1 g/dL   Albumin 3.0 (L) 3.5 - 5.0 g/dL   AST 20 15 - 41 U/L   ALT 11 (L) 17 - 63 U/L   Alkaline Phosphatase 112 38 - 126 U/L   Total Bilirubin 1.0 0.3 - 1.2 mg/dL   GFR calc non Af Amer >60 >60 mL/min   GFR calc Af Amer >60 >60 mL/min    Comment: (NOTE) The eGFR has been calculated using the CKD EPI equation. This calculation has not been validated in all clinical situations. eGFR's persistently <60 mL/min signify possible Chronic Kidney Disease.    Anion gap 7 5 - 15  Magnesium     Status: None   Collection Time: 02/02/17  2:29 AM  Result Value Ref Range   Magnesium 2.0 1.7 - 2.4 mg/dL  I-STAT 3, arterial blood gas (G3+)     Status: Abnormal   Collection Time: 02/02/17  9:42 AM  Result Value Ref Range   pH, Arterial 7.208 (L) 7.350 - 7.450   pCO2 arterial 87.7 (HH) 32.0 - 48.0 mmHg   pO2, Arterial 71.0 (L) 83.0 - 108.0 mmHg   Bicarbonate 34.9 (H) 20.0 - 28.0 mmol/L   TCO2 38 (H) 22 - 32 mmol/L   O2 Saturation 88.0 %   Acid-Base Excess 4.0 (H) 0.0 - 2.0 mmol/L    Patient temperature 98.7 F    Collection site RADIAL, ALLEN'S TEST ACCEPTABLE    Drawn by RT    Sample type ARTERIAL   Magnesium     Status: None   Collection Time: 02/02/17 11:22 AM  Result Value Ref Range   Magnesium 1.9 1.7 - 2.4 mg/dL  Phosphorus     Status: Abnormal   Collection Time: 02/02/17 11:22 AM  Result Value Ref Range   Phosphorus 4.8 (H) 2.5 - 4.6 mg/dL  Culture, respiratory (NON-Expectorated)     Status: None (Preliminary result)   Collection Time: 02/02/17 11:24 AM  Result Value Ref Range   Specimen Description TRACHEAL ASPIRATE    Special Requests NONE    Gram Stain      ABUNDANT WBC PRESENT,  PREDOMINANTLY PMN RARE SQUAMOUS EPITHELIAL CELLS PRESENT FEW BUDDING YEAST SEEN FEW GRAM POSITIVE COCCI IN PAIRS IN CHAINS FEW GRAM NEGATIVE RODS RARE GRAM POSITIVE RODS    Culture PENDING    Report Status PENDING   I-STAT 3, arterial blood gas (G3+)     Status: Abnormal   Collection Time: 02/02/17 12:12 PM  Result Value Ref Range   pH, Arterial 7.222 (L) 7.350 - 7.450   pCO2 arterial 72.1 (HH) 32.0 - 48.0 mmHg   pO2, Arterial 190.0 (H) 83.0 - 108.0 mmHg   Bicarbonate 29.6 (H) 20.0 - 28.0 mmol/L   TCO2 32 22 - 32 mmol/L   O2 Saturation 99.0 %   Patient temperature 98.6 F    Collection site RADIAL, ALLEN'S TEST ACCEPTABLE    Drawn by RT    Sample type ARTERIAL    Comment NOTIFIED PHYSICIAN   Glucose, capillary     Status: Abnormal   Collection Time: 02/02/17 12:19 PM  Result Value Ref Range   Glucose-Capillary 104 (H) 65 - 99 mg/dL   Comment 1 Notify RN   Magnesium     Status: None   Collection Time: 02/02/17  4:20 PM  Result Value Ref Range   Magnesium 1.7 1.7 - 2.4 mg/dL  Phosphorus     Status: Abnormal   Collection Time: 02/02/17  4:20 PM  Result Value Ref Range   Phosphorus 1.8 (L) 2.5 - 4.6 mg/dL  Glucose, capillary     Status: Abnormal   Collection Time: 02/02/17  4:49 PM  Result Value Ref Range   Glucose-Capillary 160 (H) 65 - 99 mg/dL   Comment  1 Notify RN   I-STAT 3, arterial blood gas (G3+)     Status: Abnormal   Collection Time: 02/02/17  4:59 PM  Result Value Ref Range   pH, Arterial 7.337 (L) 7.350 - 7.450   pCO2 arterial 50.8 (H) 32.0 - 48.0 mmHg   pO2, Arterial 63.0 (L) 83.0 - 108.0 mmHg   Bicarbonate 27.2 20.0 - 28.0 mmol/L   TCO2 29 22 - 32 mmol/L   O2 Saturation 90.0 %   Acid-Base Excess 1.0 0.0 - 2.0 mmol/L   Patient temperature 98.4 F    Collection site RADIAL, ALLEN'S TEST ACCEPTABLE    Drawn by RT    Sample type ARTERIAL   Glucose, capillary     Status: Abnormal   Collection Time: 02/02/17  8:34 PM  Result Value Ref Range   Glucose-Capillary 171 (H) 65 - 99 mg/dL   Comment 1 Capillary Specimen    Comment 2 Notify RN   Glucose, capillary     Status: Abnormal   Collection Time: 02/02/17 11:30 PM  Result Value Ref Range   Glucose-Capillary 164 (H) 65 - 99 mg/dL   Comment 1 Capillary Specimen    Comment 2 Notify RN   Comprehensive metabolic panel     Status: Abnormal   Collection Time: 02/03/17  2:01 AM  Result Value Ref Range   Sodium 134 (L) 135 - 145 mmol/L   Potassium 4.0 3.5 - 5.1 mmol/L   Chloride 95 (L) 101 - 111 mmol/L   CO2 29 22 - 32 mmol/L   Glucose, Bld 170 (H) 65 - 99 mg/dL   BUN 40 (H) 6 - 20 mg/dL   Creatinine, Ser 1.30 (H) 0.61 - 1.24 mg/dL   Calcium 8.0 (L) 8.9 - 10.3 mg/dL   Total Protein 6.3 (L) 6.5 - 8.1 g/dL   Albumin 2.6 (L) 3.5 -  5.0 g/dL   AST 30 15 - 41 U/L   ALT 12 (L) 17 - 63 U/L   Alkaline Phosphatase 88 38 - 126 U/L   Total Bilirubin 0.8 0.3 - 1.2 mg/dL   GFR calc non Af Amer 48 (L) >60 mL/min   GFR calc Af Amer 55 (L) >60 mL/min    Comment: (NOTE) The eGFR has been calculated using the CKD EPI equation. This calculation has not been validated in all clinical situations. eGFR's persistently <60 mL/min signify possible Chronic Kidney Disease.    Anion gap 10 5 - 15  CBC with Differential/Platelet     Status: Abnormal   Collection Time: 02/03/17  2:01 AM  Result  Value Ref Range   WBC 15.9 (H) 4.0 - 10.5 K/uL   RBC 3.97 (L) 4.22 - 5.81 MIL/uL   Hemoglobin 10.4 (L) 13.0 - 17.0 g/dL   HCT 34.2 (L) 39.0 - 52.0 %   MCV 86.1 78.0 - 100.0 fL   MCH 26.2 26.0 - 34.0 pg   MCHC 30.4 30.0 - 36.0 g/dL   RDW 14.7 11.5 - 15.5 %   Platelets 126 (L) 150 - 400 K/uL   Neutrophils Relative % 96 %   Neutro Abs 15.3 (H) 1.7 - 7.7 K/uL   Lymphocytes Relative 3 %   Lymphs Abs 0.4 (L) 0.7 - 4.0 K/uL   Monocytes Relative 1 %   Monocytes Absolute 0.2 0.1 - 1.0 K/uL   Eosinophils Relative 0 %   Eosinophils Absolute 0.0 0.0 - 0.7 K/uL   Basophils Relative 0 %   Basophils Absolute 0.0 0.0 - 0.1 K/uL  Sedimentation rate     Status: Abnormal   Collection Time: 02/03/17  2:01 AM  Result Value Ref Range   Sed Rate 57 (H) 0 - 16 mm/hr  Magnesium     Status: None   Collection Time: 02/03/17  2:01 AM  Result Value Ref Range   Magnesium 1.9 1.7 - 2.4 mg/dL  Phosphorus     Status: Abnormal   Collection Time: 02/03/17  2:01 AM  Result Value Ref Range   Phosphorus 1.5 (L) 2.5 - 4.6 mg/dL  Glucose, capillary     Status: Abnormal   Collection Time: 02/03/17  3:25 AM  Result Value Ref Range   Glucose-Capillary 146 (H) 65 - 99 mg/dL   Comment 1 Capillary Specimen    Comment 2 Notify RN   Glucose, capillary     Status: Abnormal   Collection Time: 02/03/17  8:07 AM  Result Value Ref Range   Glucose-Capillary 154 (H) 65 - 99 mg/dL   Comment 1 Notify RN     ECG   N/A  Telemetry   Sinus rhythm with PAC's in the 70's - Personally Reviewed  Radiology    Ct Angio Chest Pe W Or Wo Contrast  Result Date: 02/01/2017 CLINICAL DATA:  Interstitial lung disease.  Fall. EXAM: CT ANGIOGRAPHY CHEST WITH CONTRAST TECHNIQUE: Multidetector CT imaging of the chest was performed using the standard protocol during bolus administration of intravenous contrast. Multiplanar CT image reconstructions and MIPs were obtained to evaluate the vascular anatomy. CONTRAST:  189m ISOVUE-370  IOPAMIDOL (ISOVUE-370) INJECTION 76% COMPARISON:  None. FINDINGS: Cardiovascular: Cardiomegaly. Reflux of contrast into the IVC and hepatic veins suggest right heart failure. Tortuosity of the thoracic aorta. No aneurysm. No filling defects in the pulmonary arteries to suggest pulmonary emboli. Mediastinum/Nodes: No mediastinal, hilar, or axillary adenopathy. Lungs/Pleura: Linear areas of subsegmental atelectasis in the lung  bases. No effusions. Upper Abdomen: Imaging into the upper abdomen shows no acute findings. Musculoskeletal: Chest wall soft tissues are unremarkable. No acute bony abnormality. Numerous compression fractures throughout the thoracic spine, stable. Review of the MIP images confirms the above findings. IMPRESSION: Cardiomegaly. Reflux of contrast into the IVC and hepatic veins suggests right heart failure. Tortuosity of the thoracic aorta.  No aneurysm. No evidence of pulmonary embolus. Bibasilar atelectasis. Electronically Signed   By: Rolm Baptise M.D.   On: 02/01/2017 13:59   Dg Pelvis Comp Min 3v  Result Date: 02/02/2017 CLINICAL DATA:  Pelvic fractures. EXAM: JUDET PELVIS - 3+ VIEW COMPARISON:  CT scan dated 02/22/2017 and radiographs dated 02/16/2017 FINDINGS: Again noted are multiple fractures of the right ilium with slight displacement noted on the Judet views. No significant displacement of the acetabular components of the fracture. Healed bilateral proximal femoral fractures with hardware in place. IMPRESSION: No appreciable change in alignment or position of the extensive right pelvic fractures. Electronically Signed   By: Lorriane Shire M.D.   On: 02/02/2017 11:20   Dg Chest Port 1 View  Result Date: 02/02/2017 CLINICAL DATA:  Acute respiratory failure. EXAM: PORTABLE CHEST 1 VIEW COMPARISON:  02/01/2017 and 02/17/2017 FINDINGS: There is no opacification at the left lung base which could represent a moderate left effusion or consolidation in the left lower lobe. There is slight  haziness in the left perihilar region extending into the left upper lobe. The right lung is clear. Multiple old right rib fractures and thoracic spine fractures. Multiple lumbar fractures treated with vertebroplasty. Posttraumatic and arthritic changes of proximal right humerus. Endotracheal tube and NG tube appear in good position. Aortic atherosclerosis. Small metallic foreign body in the posterior aspect of the fundus of the stomach, not present on the prior CT scan of 01/25/2016. IMPRESSION: 1. New opacification at the left lung base consistent with either moderate joint effusion or consolidation in the left lower lobe. 2. New faint left perihilar infiltrate extending into the left upper lobe. 3. Endotracheal tube and NG tube in good position. 4. Small metallic foreign body in the stomach of unknown etiology. Electronically Signed   By: Lorriane Shire M.D.   On: 02/02/2017 11:16   Portable Chest X-ray 1 View  Result Date: 02/01/2017 CLINICAL DATA:  Hypoxemia EXAM: PORTABLE CHEST 1 VIEW COMPARISON:  02/09/2017 FINDINGS: 0845 hours. The cardio pericardial silhouette is enlarged. The lungs are clear without focal pneumonia, edema, pneumothorax or pleural effusion. Interstitial markings are diffusely coarsened with chronic features. Bones are diffusely demineralized. Telemetry leads overlie the chest. IMPRESSION: Cardiomegaly with underlying chronic interstitial lung disease. Electronically Signed   By: Misty Stanley M.D.   On: 02/01/2017 09:08   Dg Abd Portable 1v  Result Date: 02/02/2017 CLINICAL DATA:  O G-tube placement EXAM: PORTABLE ABDOMEN - 1 VIEW COMPARISON:  01/26/2016 FINDINGS: Enteric tube tip is in the body of the stomach with side port well below GE junction. Adjacent indeterminate radiodensity projecting over the left upper quadrant of the abdomen measures 7 mm. The bowel gas pattern appears nonobstructed. A moderate stool burden noted throughout the colon. IMPRESSION: 1. Enteric tube tip is  in the body of the stomach. Electronically Signed   By: Kerby Moors M.D.   On: 02/02/2017 11:07    Cardiac Studies   N/A  Assessment   Principal Problem:   Acute on chronic respiratory failure with hypoxia (HCC) Active Problems:   COPD with acute exacerbation (HCC)   PAF (paroxysmal atrial  fibrillation) ([place])   Atrial fibrillation with RVR (HCC)   BPH (benign prostatic hyperplasia)   Pelvic fracture (HCC)   Hip fracture requiring operative repair, right, closed, initial encounter [facility])   Traumatic retroperitoneal hemorrhage   Coronary artery calcification seen on CAT scan   Abnormal nuclear stress test   Plan   1. Intubated yesterday for somnolence and respiratory distress - ABG yesterday showed 7.2/ 87/71 - now improved to 7.33/51/63. More awake, opens eyes to voice. In and out of a-fib overnight - rate controlled. Will continue amiodarone. Not a long-term anticoagulation candidate. Updated son-in-law at the bedside of his condition.  Time Spent Directly with Patient:  I have spent a total of 25 minutes with the patient reviewing hospital notes, telemetry, EKGs, labs and examining the patient as well as establishing an assessment and plan that was discussed personally with the patient. > 50% of time was spent in direct patient care.  Length of Stay:  LOS: 2 days   Pixie Casino, MD, [facility], [place] Director of the [facility] Attending Cardiologist  Direct Dial: [phone]  Fax: [phone]  Website:  [email or web address] 02/03/2017, 8:34 AM

## 2017-02-04 ENCOUNTER — Other Ambulatory Visit (HOSPITAL_COMMUNITY): Payer: Medicare Other

## 2017-02-04 ENCOUNTER — Inpatient Hospital Stay (HOSPITAL_COMMUNITY): Payer: Medicare Other

## 2017-02-04 DIAGNOSIS — J90 Pleural effusion, not elsewhere classified: Secondary | ICD-10-CM

## 2017-02-04 LAB — CBC WITH DIFFERENTIAL/PLATELET
BASOS ABS: 0 10*3/uL (ref 0.0–0.1)
BASOS PCT: 0 %
EOS ABS: 0 10*3/uL (ref 0.0–0.7)
Eosinophils Relative: 0 %
HEMATOCRIT: 30.3 % — AB (ref 39.0–52.0)
HEMOGLOBIN: 9.2 g/dL — AB (ref 13.0–17.0)
Lymphocytes Relative: 2 %
Lymphs Abs: 0.3 10*3/uL — ABNORMAL LOW (ref 0.7–4.0)
MCH: 26.1 pg (ref 26.0–34.0)
MCHC: 30.4 g/dL (ref 30.0–36.0)
MCV: 86.1 fL (ref 78.0–100.0)
Monocytes Absolute: 0.7 10*3/uL (ref 0.1–1.0)
Monocytes Relative: 5 %
NEUTROS ABS: 14.1 10*3/uL — AB (ref 1.7–7.7)
NEUTROS PCT: 93 %
Platelets: 146 10*3/uL — ABNORMAL LOW (ref 150–400)
RBC: 3.52 MIL/uL — AB (ref 4.22–5.81)
RDW: 15.3 % (ref 11.5–15.5)
WBC: 15.1 10*3/uL — AB (ref 4.0–10.5)

## 2017-02-04 LAB — GLUCOSE, CAPILLARY
GLUCOSE-CAPILLARY: 104 mg/dL — AB (ref 65–99)
GLUCOSE-CAPILLARY: 111 mg/dL — AB (ref 65–99)
GLUCOSE-CAPILLARY: 140 mg/dL — AB (ref 65–99)
GLUCOSE-CAPILLARY: 153 mg/dL — AB (ref 65–99)
GLUCOSE-CAPILLARY: 93 mg/dL (ref 65–99)
GLUCOSE-CAPILLARY: 95 mg/dL (ref 65–99)
Glucose-Capillary: 94 mg/dL (ref 65–99)

## 2017-02-04 LAB — BASIC METABOLIC PANEL
ANION GAP: 7 (ref 5–15)
BUN: 41 mg/dL — AB (ref 6–20)
CO2: 30 mmol/L (ref 22–32)
CREATININE: 0.91 mg/dL (ref 0.61–1.24)
Calcium: 8 mg/dL — ABNORMAL LOW (ref 8.9–10.3)
Chloride: 101 mmol/L (ref 101–111)
GLUCOSE: 125 mg/dL — AB (ref 65–99)
Potassium: 4.7 mmol/L (ref 3.5–5.1)
SODIUM: 138 mmol/L (ref 135–145)

## 2017-02-04 LAB — CULTURE, RESPIRATORY W GRAM STAIN

## 2017-02-04 LAB — BRAIN NATRIURETIC PEPTIDE: B NATRIURETIC PEPTIDE 5: 485.7 pg/mL — AB (ref 0.0–100.0)

## 2017-02-04 LAB — CULTURE, RESPIRATORY: CULTURE: NORMAL

## 2017-02-04 MED ORDER — IOPAMIDOL (ISOVUE-300) INJECTION 61%
INTRAVENOUS | Status: AC
  Administered 2017-02-04: 13:00:00
  Filled 2017-02-04: qty 30

## 2017-02-04 MED ORDER — CHLORHEXIDINE GLUCONATE 0.12% ORAL RINSE (MEDLINE KIT)
15.0000 mL | Freq: Two times a day (BID) | OROMUCOSAL | Status: DC
  Administered 2017-02-05 – 2017-02-12 (×15): 15 mL via OROMUCOSAL

## 2017-02-04 MED ORDER — MIDAZOLAM HCL 2 MG/2ML IJ SOLN
2.0000 mg | INTRAMUSCULAR | Status: AC | PRN
  Administered 2017-02-04 – 2017-02-07 (×18): 2 mg via INTRAVENOUS
  Filled 2017-02-04 (×19): qty 2

## 2017-02-04 MED ORDER — FUROSEMIDE 10 MG/ML IJ SOLN
40.0000 mg | Freq: Once | INTRAMUSCULAR | Status: AC
  Administered 2017-02-04: 40 mg via INTRAVENOUS
  Filled 2017-02-04: qty 4

## 2017-02-04 MED ORDER — IOPAMIDOL (ISOVUE-300) INJECTION 61%
INTRAVENOUS | Status: AC
Start: 2017-02-04 — End: 2017-02-04
  Administered 2017-02-04: 14:00:00
  Filled 2017-02-04: qty 100

## 2017-02-04 MED ORDER — SENNOSIDES 8.8 MG/5ML PO SYRP
5.0000 mL | ORAL_SOLUTION | Freq: Two times a day (BID) | ORAL | Status: DC
  Administered 2017-02-04 – 2017-02-12 (×17): 5 mL via ORAL
  Filled 2017-02-04 (×19): qty 5

## 2017-02-04 MED ORDER — METHYLPREDNISOLONE SODIUM SUCC 40 MG IJ SOLR
40.0000 mg | INTRAMUSCULAR | Status: DC
Start: 2017-02-04 — End: 2017-02-06
  Administered 2017-02-04 – 2017-02-05 (×2): 40 mg via INTRAVENOUS
  Filled 2017-02-04 (×2): qty 1

## 2017-02-04 MED ORDER — DEXTROSE 5 % IV SOLN
1.0000 g | INTRAVENOUS | Status: AC
  Administered 2017-02-04 – 2017-02-07 (×4): 1 g via INTRAVENOUS
  Filled 2017-02-04 (×5): qty 10

## 2017-02-04 MED ORDER — ORAL CARE MOUTH RINSE
15.0000 mL | OROMUCOSAL | Status: DC
  Administered 2017-02-04 – 2017-02-12 (×75): 15 mL via OROMUCOSAL

## 2017-02-04 NOTE — Progress Notes (Signed)
Visited with this patient and lovely son.  Chaplain was encouraged by the outlook of the son for this patient and this situation.  Will continue to stop in and provide support.  Chaplain available should additional support be needed.    02/04/17 1107  Clinical Encounter Type  Visited With Patient;Family  Visit Type Initial;Psychological support;Spiritual support

## 2017-02-04 NOTE — Consult Note (Signed)
Reason for Consult:abdominal wall hernia  Referring Physician: Titus Mould MD   Neil Terry is an 83 y.o. male.  HPI: Asked to see by CCM for incisional hernia.  Pt on ventilator after hip fracture surgery for respiratory failure.  He has had a long standing incisional hernia from previous laparotomy and this has been reducible but more distended on vent.  He is awake and thrashing mildly. He is intubated   Past Medical History:  Diagnosis Date  . BPH (benign prostatic hypertrophy)   . Bronchial asthma   . COPD (chronic obstructive pulmonary disease) ([place])   . Depression    hx  . Duodenal ulcer    perforated ~ 2013  . GERD (gastroesophageal reflux disease)   . HTN (hypertension)   . On home oxygen therapy    "8h/day; don't know how many liters"    Past Surgical History:  Procedure Laterality Date  . ABDOMINAL EXPLORATION SURGERY  02/23/2012  . CATARACT EXTRACTION, BILATERAL    . FRACTURE SURGERY    . GASTRIC ROUX-EN-Y  ~ 1226/2013   closure of duodenal stump w/Roux-en-Y gastro jejunalanastomosis  . HIP FRACTURE SURGERY Right 2000's  . HIP HARDWARE REMOVAL Right ~ 2011  . PARTIAL GASTRECTOMY  2013-2014   distal; w/closure of duodenal stump; gastro jejunalanastomosis  . TRANSURETHRAL RESECTION OF PROSTATE  2016    Family History  Problem Relation Age of Onset  . Arrhythmia Son   . Stroke Brother   . Other Mother        Died of old age  . Asthma Father     Social History:  reports that he has quit smoking. His smoking use included cigarettes. He has a 150.00 pack-year smoking history. he has never used smokeless tobacco. He reports that he does not drink alcohol or use drugs.  Allergies: No Known Allergies  Medications: I have reviewed the patient's current medications.  Results for orders placed or performed during the hospital encounter of 02/25/2017 (from the past 48 hour(s))  Magnesium     Status: None   Collection Time: 02/02/17 11:22 AM  Result Value Ref Range   Magnesium 1.9 1.7 - 2.4 mg/dL  Phosphorus     Status: Abnormal   Collection Time: 02/02/17 11:22 AM  Result Value Ref Range   Phosphorus 4.8 (H) 2.5 - 4.6 mg/dL  Culture, respiratory (NON-Expectorated)     Status: None (Preliminary result)   Collection Time: 02/02/17 11:24 AM  Result Value Ref Range   Specimen Description TRACHEAL ASPIRATE    Special Requests NONE    Gram Stain      ABUNDANT WBC PRESENT, PREDOMINANTLY PMN RARE SQUAMOUS EPITHELIAL CELLS PRESENT FEW BUDDING YEAST SEEN FEW GRAM POSITIVE COCCI IN PAIRS IN CHAINS FEW GRAM NEGATIVE RODS RARE GRAM POSITIVE RODS    Culture CULTURE REINCUBATED FOR BETTER GROWTH    Report Status PENDING   I-STAT 3, arterial blood gas (G3+)     Status: Abnormal   Collection Time: 02/02/17 12:12 PM  Result Value Ref Range   pH, Arterial 7.222 (L) 7.350 - 7.450   pCO2 arterial 72.1 (HH) 32.0 - 48.0 mmHg   pO2, Arterial 190.0 (H) 83.0 - 108.0 mmHg   Bicarbonate 29.6 (H) 20.0 - 28.0 mmol/L   TCO2 32 22 - 32 mmol/L   O2 Saturation 99.0 %   Patient temperature 98.6 F    Collection site RADIAL, ALLEN'S TEST ACCEPTABLE    Drawn by RT    Sample type ARTERIAL    Comment  NOTIFIED PHYSICIAN   Glucose, capillary     Status: Abnormal   Collection Time: 02/02/17 12:19 PM  Result Value Ref Range   Glucose-Capillary 104 (H) 65 - 99 mg/dL   Comment 1 Notify RN   Magnesium     Status: None   Collection Time: 02/02/17  4:20 PM  Result Value Ref Range   Magnesium 1.7 1.7 - 2.4 mg/dL  Phosphorus     Status: Abnormal   Collection Time: 02/02/17  4:20 PM  Result Value Ref Range   Phosphorus 1.8 (L) 2.5 - 4.6 mg/dL  Glucose, capillary     Status: Abnormal   Collection Time: 02/02/17  4:49 PM  Result Value Ref Range   Glucose-Capillary 160 (H) 65 - 99 mg/dL   Comment 1 Notify RN   I-STAT 3, arterial blood gas (G3+)     Status: Abnormal   Collection Time: 02/02/17  4:59 PM  Result Value Ref Range   pH, Arterial 7.337 (L) 7.350 - 7.450   pCO2  arterial 50.8 (H) 32.0 - 48.0 mmHg   pO2, Arterial 63.0 (L) 83.0 - 108.0 mmHg   Bicarbonate 27.2 20.0 - 28.0 mmol/L   TCO2 29 22 - 32 mmol/L   O2 Saturation 90.0 %   Acid-Base Excess 1.0 0.0 - 2.0 mmol/L   Patient temperature 98.4 F    Collection site RADIAL, ALLEN'S TEST ACCEPTABLE    Drawn by RT    Sample type ARTERIAL   Glucose, capillary     Status: Abnormal   Collection Time: 02/02/17  8:34 PM  Result Value Ref Range   Glucose-Capillary 171 (H) 65 - 99 mg/dL   Comment 1 Capillary Specimen    Comment 2 Notify RN   Glucose, capillary     Status: Abnormal   Collection Time: 02/02/17 11:30 PM  Result Value Ref Range   Glucose-Capillary 164 (H) 65 - 99 mg/dL   Comment 1 Capillary Specimen    Comment 2 Notify RN   Comprehensive metabolic panel     Status: Abnormal   Collection Time: 02/03/17  2:01 AM  Result Value Ref Range   Sodium 134 (L) 135 - 145 mmol/L   Potassium 4.0 3.5 - 5.1 mmol/L   Chloride 95 (L) 101 - 111 mmol/L   CO2 29 22 - 32 mmol/L   Glucose, Bld 170 (H) 65 - 99 mg/dL   BUN 40 (H) 6 - 20 mg/dL   Creatinine, Ser 1.30 (H) 0.61 - 1.24 mg/dL   Calcium 8.0 (L) 8.9 - 10.3 mg/dL   Total Protein 6.3 (L) 6.5 - 8.1 g/dL   Albumin 2.6 (L) 3.5 - 5.0 g/dL   AST 30 15 - 41 U/L   ALT 12 (L) 17 - 63 U/L   Alkaline Phosphatase 88 38 - 126 U/L   Total Bilirubin 0.8 0.3 - 1.2 mg/dL   GFR calc non Af Amer 48 (L) >60 mL/min   GFR calc Af Amer 55 (L) >60 mL/min    Comment: (NOTE) The eGFR has been calculated using the CKD EPI equation. This calculation has not been validated in all clinical situations. eGFR's persistently <60 mL/min signify possible Chronic Kidney Disease.    Anion gap 10 5 - 15  CBC with Differential/Platelet     Status: Abnormal   Collection Time: 02/03/17  2:01 AM  Result Value Ref Range   WBC 15.9 (H) 4.0 - 10.5 K/uL   RBC 3.97 (L) 4.22 - 5.81 MIL/uL   Hemoglobin 10.4 (L)  13.0 - 17.0 g/dL   HCT 34.2 (L) 39.0 - 52.0 %   MCV 86.1 78.0 - 100.0 fL    MCH 26.2 26.0 - 34.0 pg   MCHC 30.4 30.0 - 36.0 g/dL   RDW 14.7 11.5 - 15.5 %   Platelets 126 (L) 150 - 400 K/uL   Neutrophils Relative % 96 %   Neutro Abs 15.3 (H) 1.7 - 7.7 K/uL   Lymphocytes Relative 3 %   Lymphs Abs 0.4 (L) 0.7 - 4.0 K/uL   Monocytes Relative 1 %   Monocytes Absolute 0.2 0.1 - 1.0 K/uL   Eosinophils Relative 0 %   Eosinophils Absolute 0.0 0.0 - 0.7 K/uL   Basophils Relative 0 %   Basophils Absolute 0.0 0.0 - 0.1 K/uL  Sedimentation rate     Status: Abnormal   Collection Time: 02/03/17  2:01 AM  Result Value Ref Range   Sed Rate 57 (H) 0 - 16 mm/hr  Magnesium     Status: None   Collection Time: 02/03/17  2:01 AM  Result Value Ref Range   Magnesium 1.9 1.7 - 2.4 mg/dL  Phosphorus     Status: Abnormal   Collection Time: 02/03/17  2:01 AM  Result Value Ref Range   Phosphorus 1.5 (L) 2.5 - 4.6 mg/dL  Glucose, capillary     Status: Abnormal   Collection Time: 02/03/17  3:25 AM  Result Value Ref Range   Glucose-Capillary 146 (H) 65 - 99 mg/dL   Comment 1 Capillary Specimen    Comment 2 Notify RN   Glucose, capillary     Status: Abnormal   Collection Time: 02/03/17  8:07 AM  Result Value Ref Range   Glucose-Capillary 154 (H) 65 - 99 mg/dL   Comment 1 Notify RN   I-STAT 3, arterial blood gas (G3+)     Status: Abnormal   Collection Time: 02/03/17 10:48 AM  Result Value Ref Range   pH, Arterial 7.332 (L) 7.350 - 7.450   pCO2 arterial 56.8 (H) 32.0 - 48.0 mmHg   pO2, Arterial 85.0 83.0 - 108.0 mmHg   Bicarbonate 30.1 (H) 20.0 - 28.0 mmol/L   TCO2 32 22 - 32 mmol/L   O2 Saturation 95.0 %   Acid-Base Excess 3.0 (H) 0.0 - 2.0 mmol/L   Patient temperature HIDE    Collection site RADIAL, ALLEN'S TEST ACCEPTABLE    Drawn by RT    Sample type ARTERIAL   Glucose, capillary     Status: Abnormal   Collection Time: 02/03/17 11:37 AM  Result Value Ref Range   Glucose-Capillary 137 (H) 65 - 99 mg/dL   Comment 1 Notify RN   Magnesium     Status: None   Collection  Time: 02/03/17  4:31 PM  Result Value Ref Range   Magnesium 2.1 1.7 - 2.4 mg/dL  Phosphorus     Status: None   Collection Time: 02/03/17  4:31 PM  Result Value Ref Range   Phosphorus 2.8 2.5 - 4.6 mg/dL  Glucose, capillary     Status: Abnormal   Collection Time: 02/03/17  4:47 PM  Result Value Ref Range   Glucose-Capillary 153 (H) 65 - 99 mg/dL   Comment 1 Notify RN   Glucose, capillary     Status: Abnormal   Collection Time: 02/03/17  8:21 PM  Result Value Ref Range   Glucose-Capillary 114 (H) 65 - 99 mg/dL   Comment 1 Capillary Specimen    Comment 2 Notify RN  Glucose, capillary     Status: Abnormal   Collection Time: 02/03/17 11:45 PM  Result Value Ref Range   Glucose-Capillary 125 (H) 65 - 99 mg/dL   Comment 1 Capillary Specimen    Comment 2 Notify RN   Basic metabolic panel     Status: Abnormal   Collection Time: 02/04/17  2:19 AM  Result Value Ref Range   Sodium 138 135 - 145 mmol/L   Potassium 4.7 3.5 - 5.1 mmol/L   Chloride 101 101 - 111 mmol/L   CO2 30 22 - 32 mmol/L   Glucose, Bld 125 (H) 65 - 99 mg/dL   BUN 41 (H) 6 - 20 mg/dL   Creatinine, Ser 0.91 0.61 - 1.24 mg/dL   Calcium 8.0 (L) 8.9 - 10.3 mg/dL   GFR calc non Af Amer >60 >60 mL/min   GFR calc Af Amer >60 >60 mL/min    Comment: (NOTE) The eGFR has been calculated using the CKD EPI equation. This calculation has not been validated in all clinical situations. eGFR's persistently <60 mL/min signify possible Chronic Kidney Disease.    Anion gap 7 5 - 15  Brain natriuretic peptide     Status: Abnormal   Collection Time: 02/04/17  2:19 AM  Result Value Ref Range   B Natriuretic Peptide 485.7 (H) 0.0 - 100.0 pg/mL  Glucose, capillary     Status: Abnormal   Collection Time: 02/04/17  3:34 AM  Result Value Ref Range   Glucose-Capillary 111 (H) 65 - 99 mg/dL   Comment 1 Capillary Specimen    Comment 2 Notify RN   CBC with Differential/Platelet     Status: Abnormal   Collection Time: 02/04/17  6:20 AM    Result Value Ref Range   WBC 15.1 (H) 4.0 - 10.5 K/uL   RBC 3.52 (L) 4.22 - 5.81 MIL/uL   Hemoglobin 9.2 (L) 13.0 - 17.0 g/dL   HCT 30.3 (L) 39.0 - 52.0 %   MCV 86.1 78.0 - 100.0 fL   MCH 26.1 26.0 - 34.0 pg   MCHC 30.4 30.0 - 36.0 g/dL   RDW 15.3 11.5 - 15.5 %   Platelets 146 (L) 150 - 400 K/uL   Neutrophils Relative % 93 %   Neutro Abs 14.1 (H) 1.7 - 7.7 K/uL   Lymphocytes Relative 2 %   Lymphs Abs 0.3 (L) 0.7 - 4.0 K/uL   Monocytes Relative 5 %   Monocytes Absolute 0.7 0.1 - 1.0 K/uL   Eosinophils Relative 0 %   Eosinophils Absolute 0.0 0.0 - 0.7 K/uL   Basophils Relative 0 %   Basophils Absolute 0.0 0.0 - 0.1 K/uL  Glucose, capillary     Status: Abnormal   Collection Time: 02/04/17  8:17 AM  Result Value Ref Range   Glucose-Capillary 140 (H) 65 - 99 mg/dL   Comment 1 Capillary Specimen     Dg Pelvis Comp Min 3v  Result Date: 02/02/2017 CLINICAL DATA:  Pelvic fractures. EXAM: [facility] PELVIS - 3+ VIEW COMPARISON:  CT scan dated 02/05/2017 and radiographs dated 02/21/2017 FINDINGS: Again noted are multiple fractures of the right ilium with slight displacement noted on the [facility] views. No significant displacement of the acetabular components of the fracture. Healed bilateral proximal femoral fractures with hardware in place. IMPRESSION: No appreciable change in alignment or position of the extensive right pelvic fractures. Electronically Signed   By: Lorriane Shire M.D.   On: 02/02/2017 11:20   Dg Chest [facility]  Result Date: 02/04/2017 CLINICAL DATA:  Check endotracheal tube placement EXAM: PORTABLE CHEST 1 VIEW COMPARISON:  02/03/2017 FINDINGS: Endotracheal tube and nasogastric catheter are noted in satisfactory position. Cardiac shadow is enlarged but stable. Aortic calcifications are again seen. Small bilateral pleural effusions are noted left greater than right with patchy infiltrate in the bases bilaterally. The overall appearance is stable from the prior exam. Changes of  prior vertebral augmentation are noted. IMPRESSION: Bibasilar changes as described stable from the prior exam. Electronically Signed   By: Inez Catalina M.D.   On: 02/04/2017 08:59   Dg Chest Port 1 View  Result Date: 02/03/2017 CLINICAL DATA:  Acute respiratory acidosis. Endotracheal tube placement EXAM: PORTABLE CHEST 1 VIEW COMPARISON:  02/02/2017 FINDINGS: Endotracheal tube in good position.  Gastric tube in the stomach. Bibasilar atelectasis has progressed. Small left effusion has progressed. Negative for edema. IMPRESSION: Progressive bibasilar atelectasis and small left effusion. Endotracheal tube in good position. Electronically Signed   By: Franchot Gallo M.D.   On: 02/03/2017 08:38   Dg Chest Port 1 View  Result Date: 02/02/2017 CLINICAL DATA:  Acute respiratory failure. EXAM: PORTABLE CHEST 1 VIEW COMPARISON:  02/01/2017 and 02/08/2017 FINDINGS: There is no opacification at the left lung base which could represent a moderate left effusion or consolidation in the left lower lobe. There is slight haziness in the left perihilar region extending into the left upper lobe. The right lung is clear. Multiple old right rib fractures and thoracic spine fractures. Multiple lumbar fractures treated with vertebroplasty. Posttraumatic and arthritic changes of proximal right humerus. Endotracheal tube and NG tube appear in good position. Aortic atherosclerosis. Small metallic foreign body in the posterior aspect of the fundus of the stomach, not present on the prior CT scan of 01/25/2016. IMPRESSION: 1. New opacification at the left lung base consistent with either moderate joint effusion or consolidation in the left lower lobe. 2. New faint left perihilar infiltrate extending into the left upper lobe. 3. Endotracheal tube and NG tube in good position. 4. Small metallic foreign body in the stomach of unknown etiology. Electronically Signed   By: Lorriane Shire M.D.   On: 02/02/2017 11:16   Dg Abd Portable  1v  Result Date: 02/02/2017 CLINICAL DATA:  O G-tube placement EXAM: PORTABLE ABDOMEN - 1 VIEW COMPARISON:  01/26/2016 FINDINGS: Enteric tube tip is in the body of the stomach with side port well below GE junction. Adjacent indeterminate radiodensity projecting over the left upper quadrant of the abdomen measures 7 mm. The bowel gas pattern appears nonobstructed. A moderate stool burden noted throughout the colon. IMPRESSION: 1. Enteric tube tip is in the body of the stomach. Electronically Signed   By: Kerby Moors M.D.   On: 02/02/2017 11:07    Review of Systems  Unable to perform ROS: Acuity of condition   Blood pressure 105/88, pulse 94, temperature 99.2 F (37.3 C), temperature source Oral, resp. rate (!) 22, height '5\' 6"'$  (1.676 m), weight 72.3 kg (159 lb 6.3 oz), SpO2 100 %. Physical Exam  Constitutional: He appears distressed. He is intubated.  HENT:  Head: Normocephalic.  Eyes: Pupils are equal, round, and reactive to light.  Neck: Normal range of motion.  Cardiovascular: Normal rate and regular rhythm.  Respiratory: He is intubated.  GI: Soft. He exhibits distension. There is no tenderness.      Assessment/Plan: Patient Active Problem List   Diagnosis Date Noted  . Pelvic fracture ([place]) 02/01/2017  . Hip fracture requiring operative  repair, right, closed, initial encounter ([place]) 02/01/2017  . H/O: GI bleed 02/01/2017  . Traumatic retroperitoneal hemorrhage 02/01/2017  . Coronary artery calcification seen on CAT scan   . Abnormal nuclear stress test   . Dyspnea on exertion 04/09/2016  . Bowel obstruction ([place]) 04/09/2016  . Abdominal pain   . Hernia, abdominal 01/25/2016  . GERD without esophagitis 01/25/2016  . Incisional hernia   . Chronic anticoagulation 11/24/2014  . PAF (paroxysmal atrial fibrillation) ([place]) 11/10/2014  . Atrial fibrillation with RVR ([place]) 11/10/2014  . Chest pain 11/10/2014  . RUQ pain 11/10/2014  . Renal cyst, right 11/10/2014  . AAA  (abdominal aortic aneurysm) without rupture ([place]) 11/10/2014  . Ventral hernia 11/10/2014  . BPH (benign prostatic hyperplasia) 11/10/2014  . Nausea and vomiting 11/09/2014  . CAP (community acquired pneumonia) 11/09/2014  . COPD with acute exacerbation ([place]) 11/09/2014  . Abdominal pain, epigastric 11/09/2014  . Right hip pain 11/08/2014  . Acute urinary retention 11/08/2014  . Acute on chronic respiratory failure with hypoxia ([place]) 11/08/2014  . Fall   . Hypoxia      Incisional hernia  Most of this is from being on PPV and bowel gas  Will check CT  I don't think this is contributing to his underlying problem   Marcello Moores A Jazara Swiney 02/04/2017, 9:54 AM

## 2017-02-04 NOTE — Care Management Note (Addendum)
Case Management Note  Patient Details  Name: Lucienne MinksFarid Pagan MRN: [id number] Date of Birth: 1927/07/25  Subjective/Objective:  From home with son, [name]. NCM spoke with patient's son [name], he would like for patient to be able to go [facility] , [place] [facility] looking to see if patient is a candidate.  On 12/4 , she states he will need two more days in ICU.  Will discuss patient in  LOS for LTACH.   12/6 1541 Letha Capeeborah Elmire Amrein RN, BSN - Discussed in LOS, [facility] stable enough. [name] with [facility] following for LTACH.  12/7 1520 Letha Capeeborah Jarman Litton RN, BSN-He is unable to wean from ventilator at this time secondary to pain from fractured pelvis and fractured rib.  He will most likely the need end-of-life discussions or tracheostomy to be liberated from mechanical ventilatory support in a more leisurely rate per PCCM note.  Per family does not want trach or peg, will terminally extubate when family is ready, likely next week.    12/12 1049 Letha Capeeborah Raevon Broom RN, BSN - plan one way extubate [place], (prob Sat per MD note).                   Action/Plan: NCM will follow for dc needs.   Expected Discharge Date:                  Expected Discharge Plan:  Long Term Acute Care (LTAC)  In-House Referral:     Discharge planning Services  CM Consult  Post Acute Care Choice:    Choice offered to:     DME Arranged:    DME Agency:     HH Arranged:    HH Agency:     Status of Service:  In process, will continue to follow  If discussed at Long Length of Stay Meetings, dates discussed:    Additional Comments:  Leone Havenaylor, Judithe Keetch Clinton, RN 02/04/2017, 9:01 PM

## 2017-02-04 NOTE — Progress Notes (Signed)
PULMONARY / CRITICAL CARE MEDICINE   Name: Neil Terry MRN: [id number] DOB: 10/20/1927    ADMISSION DATE:  02/06/2017 CONSULTATION DATE:  02/01/2017  REFERRING MD:  Brett CanalesSheehan, Theresa  CHIEF COMPLAINT: We are asked to consult for increasing oxygen requirement and atrial fibrillation with rapid ventricular response  HISTORY OF PRESENT ILLNESS:   This is an 83 year old with oxygen dependent COPD at baseline who suffered from a ground-level fall yesterday.  Since admission he has developed increasing oxygen requirements and is developed atrial fibrillation with rapid ventricular response.  An amiodarone infusion was initiated this morning and he is already responded with a heart rate in the 80s during my examination.  He denies any increase in his usual cough or any subjective increase in dyspnea.  He is not having any sort of chest pain including including pleuritic chest pain.   SUBJECTIVE:  amio held Dig provided Rate controlled Pos 2.6 liters  VITAL SIGNS: BP 108/65   Pulse 72   Temp 98.9 F (37.2 C) (Axillary)   Resp (!) 24   Ht 5\' 6"  (1.676 m)   Wt 72.3 kg (159 lb 6.3 oz)   SpO2 100%   BMI 25.73 kg/m     VENTILATOR SETTINGS: Vent Mode: PRVC FiO2 (%):  [40 %] 40 % Set Rate:  [24 bmp] 24 bmp Vt Set:  [510 mL] 510 mL PEEP:  [5 cmH20] 5 cmH20 Plateau Pressure:  [20 cmH20-28 cmH20] 24 cmH20  INTAKE / OUTPUT: I/O last 3 completed shifts: In: 6227.4 [I.V.:3158.4; NG/GT:2549; IV Piggyback:520] Out: 2475 [Urine:2475]  PHYSICAL EXAMINATION: General: rass -1 Neuro: with son follows commands well, moves ext  HEENT: perrl, jvd evident PULM: CTA , no overt wheezing, redcued bases CV:  s1 s2 IRR no r, in and out fib GI: soft, Bs wnl, reducible hernia mid that I think totally reduced but has increased in size inferior Extremities: edema    LABS:  BMET Recent Labs  Lab 02/02/17 0229 02/03/17 0201 02/04/17 0219  NA 135 134* 138  K 4.8 4.0 4.7  CL 96* 95* 101  CO2  32 29 30  BUN 19 40* 41*  CREATININE 0.88 1.30* 0.91  GLUCOSE 112* 170* 125*    Electrolytes Recent Labs  Lab 02/02/17 0229  02/02/17 1620 02/03/17 0201 02/03/17 1631 02/04/17 0219  CALCIUM 8.2*  --   --  8.0*  --  8.0*  MG 2.0   < > 1.7 1.9 2.1  --   PHOS  --    < > 1.8* 1.5* 2.8  --    < > = values in this interval not displayed.    CBC Recent Labs  Lab 02/02/17 0229 02/03/17 0201 02/04/17 0620  WBC 17.9* 15.9* 15.1*  HGB 12.5* 10.4* 9.2*  HCT 42.1 34.2* 30.3*  PLT 142* 126* 146*    Coag's Recent Labs  Lab 2016/08/15 2334 02/01/17 1029 02/02/17 0229  APTT  --  35  --   INR 1.17 1.19 1.21    Sepsis Markers Recent Labs  Lab 02/01/17 0155  LATICACIDVEN 0.73    ABG Recent Labs  Lab 02/02/17 1212 02/02/17 1659 02/03/17 1048  PHART 7.222* 7.337* 7.332*  PCO2ART 72.1* 50.8* 56.8*  PO2ART 190.0* 63.0* 85.0    Liver Enzymes Recent Labs  Lab 02/02/17 0229 02/03/17 0201  AST 20 30  ALT 11* 12*  ALKPHOS 112 88  BILITOT 1.0 0.8  ALBUMIN 3.0* 2.6*    Cardiac Enzymes Recent Labs  Lab 02/01/17 0144 02/01/17 1029  TROPONINI <0.03 <0.03    Glucose Recent Labs  Lab 02/03/17 1137 02/03/17 1647 02/03/17 2021 02/03/17 2345 02/04/17 0334 02/04/17 0817  GLUCAP 137* 153* 114* 125* 111* 140*    Imaging No results found.   STUDIES:  CTA 12/2- copd, no pe    DISCUSSION: This is an 83 year old with O2 dependent COPD at baseline who is suffered from a fall with a subsequent right pelvic fracture.  We are asked to see the patient because of increasing oxygen requirement, and atrial fibrillation with rapid ventricular response.  There is no overt provocation by history exam or x-ray for an increased oxygen requirement and if his creatinine returns normal I think a CTA as you have ordered is prudent.  Pertinent to the A. fib he has already responded to amiodarone I have no other suggested intervention other than to ensure adequate pain control  which I was suspect was the driver of his arrhythmia.  Serial enzymes have been ordered and electrolytes are pending.  ASSESSMENT / PLAN:  COPD with exacerbation Acute resp failure ATX  Assess for asp PNA/ hcap Fib rvr with diastolic CHF ARF, 12/4 over diuresis? Now crt better hypophos Reducible chronic hernia abdomen INcreased in size, from pos pressure? POS baalnce  -pcxr which I reviewed shows worsening int rt base changes and left, this is unclear to me to be PNA vs effusion left , atx rt? -can re KVO -last abg reviewed, no new, wold prefer now lower MV / rate on vent with resolution to some degree of NONAG -repeat weaning, cpap 5 ps 5, goal 2 hours, failed, increase PS further to as high as 20 -repeat pcxr, may need CT chest  -dig mini load given and responded well, maintain dig, 0.125 and follow clinical response -MAP is at goal -reduce steroids as limited to no wheezing -I had concerns for over diuresis 12/4, and crt down, but some concern now for volume on chest, lasix per cards, follow trend crt in am  -feeding successful, monitor BM -will hold TF until have gen surgery assess -micro, reviewed, in full, no growth, consider to change to CTX -holding anticoagulation further with hip fx and bleeding risk Will update son when he arrives  Ccm time 32 min   Mcarthur RossettiDaniel J. Tyson [name], MD, FACP Pgr: [phone] [facility]

## 2017-02-04 NOTE — Progress Notes (Signed)
DAILY PROGRESS NOTE   Patient Name: Neil Terry Date of Encounter: 02/04/2017  Chief Complaint   Intubated, sedated on vent  Patient Profile   Neil Terry is a 83 y.o. male with a hx of atrial fibrillation who is being seen today for the evaluation of atrial fibrillation w RVR at the request of Dr. Evangeline Gula.  Subjective   CXR yesterday shows progressive bibasilar atelactasis and small left pleural effusion. Amiodarone held d/t ?toxicity - ESR of 56. I's/O's suggest he is 2L volume up. CXR today shows left pleural effusion may be worse per my review.  Objective   Vitals:   02/04/17 0500 02/04/17 0600 02/04/17 0700 02/04/17 0753  BP: 108/69 121/65 108/65 108/65  Pulse: 77 83 84 72  Resp: (!) 24 (!) 24 (!) 22 (!) 24  Temp:      TempSrc:      SpO2: 98% 99% 100% 100%  Weight:      Height:        Intake/Output Summary (Last 24 hours) at 02/04/2017 0842 Last data filed at 02/04/2017 0700 Gross per 24 hour  Intake 4095.49 ml  Output 1500 ml  Net 2595.49 ml   Filed Weights   02/02/17 0348 02/03/17 0401 02/04/17 0400  Weight: 153 lb 14.1 oz (69.8 kg) 159 lb 6.3 oz (72.3 kg) 159 lb 6.3 oz (72.3 kg)    Physical Exam   General appearance: intubated, sedated on vent, opens eyes to voice Neck: no carotid bruit, no JVD and thyroid not enlarged, symmetric, no tenderness/mass/nodules Lungs: diminished breath sounds bilaterally, dullness to percussion LLL and rales bibasilar Heart: irregularly irregular rhythm and S1, S2 normal Abdomen: soft, non-tender; bowel sounds normal; no masses,  no organomegaly Extremities: extremities normal, atraumatic, no cyanosis or edema Pulses: 2+ and symmetric Skin: Skin color, texture, turgor normal. No rashes or lesions Neurologic: Mental status: intubated, lightly sedated on vent, opens eyes to voice Psych: Cannot assess  Inpatient Medications    Scheduled Meds: . acetaminophen  500 mg Oral BID  . chlorhexidine gluconate (MEDLINE KIT)  15  mL Mouth Rinse BID  . cholecalciferol  2,000 Units Oral Daily  . digoxin  0.125 mg Oral Daily  . fentaNYL (SUBLIMAZE) injection  50 mcg Intravenous Once  . gabapentin  300 mg Oral TID  . insulin aspart  0-9 Units Subcutaneous Q4H  . ipratropium  0.5 mg Nebulization TID  . levalbuterol  0.63 mg Nebulization TID  . mouth rinse  15 mL Mouth Rinse QID  . methylPREDNISolone (SOLU-MEDROL) injection  40 mg Intravenous Q12H  . midazolam  2 mg Intravenous Once  . mometasone-formoterol  2 puff Inhalation BID  . pantoprazole sodium  40 mg Per Tube Daily  . polyethylene glycol  17 g Oral Daily  . potassium chloride  10 mEq Oral Daily  . pramipexole  0.25 mg Oral QHS  . senna-docusate  1 tablet Oral Daily  . sertraline  50 mg Oral QHS  . tamsulosin  0.4 mg Oral QPC supper    Continuous Infusions: . sodium chloride 75 mL/hr at 02/04/17 0700  . cefTAZidime (FORTAZ)  IV Stopped (02/04/17 0013)  . feeding supplement (VITAL AF 1.2 CAL) 1,000 mL (02/04/17 0700)  . fentaNYL infusion INTRAVENOUS 350 mcg/hr (02/04/17 0340)    PRN Meds: fentaNYL, fentaNYL (SUBLIMAZE) injection   Labs   Results for orders placed or performed during the hospital encounter of 02/21/2017 (from the past 48 hour(s))  I-STAT 3, arterial blood gas (G3+)  Status: Abnormal   Collection Time: 02/02/17  9:42 AM  Result Value Ref Range   pH, Arterial 7.208 (L) 7.350 - 7.450   pCO2 arterial 87.7 (HH) 32.0 - 48.0 mmHg   pO2, Arterial 71.0 (L) 83.0 - 108.0 mmHg   Bicarbonate 34.9 (H) 20.0 - 28.0 mmol/L   TCO2 38 (H) 22 - 32 mmol/L   O2 Saturation 88.0 %   Acid-Base Excess 4.0 (H) 0.0 - 2.0 mmol/L   Patient temperature 98.7 F    Collection site RADIAL, ALLEN'S TEST ACCEPTABLE    Drawn by RT    Sample type ARTERIAL   Magnesium     Status: None   Collection Time: 02/02/17 11:22 AM  Result Value Ref Range   Magnesium 1.9 1.7 - 2.4 mg/dL  Phosphorus     Status: Abnormal   Collection Time: 02/02/17 11:22 AM  Result Value  Ref Range   Phosphorus 4.8 (H) 2.5 - 4.6 mg/dL  Culture, respiratory (NON-Expectorated)     Status: None (Preliminary result)   Collection Time: 02/02/17 11:24 AM  Result Value Ref Range   Specimen Description TRACHEAL ASPIRATE    Special Requests NONE    Gram Stain      ABUNDANT WBC PRESENT, PREDOMINANTLY PMN RARE SQUAMOUS EPITHELIAL CELLS PRESENT FEW BUDDING YEAST SEEN FEW GRAM POSITIVE COCCI IN PAIRS IN CHAINS FEW GRAM NEGATIVE RODS RARE GRAM POSITIVE RODS    Culture CULTURE REINCUBATED FOR BETTER GROWTH    Report Status PENDING   I-STAT 3, arterial blood gas (G3+)     Status: Abnormal   Collection Time: 02/02/17 12:12 PM  Result Value Ref Range   pH, Arterial 7.222 (L) 7.350 - 7.450   pCO2 arterial 72.1 (HH) 32.0 - 48.0 mmHg   pO2, Arterial 190.0 (H) 83.0 - 108.0 mmHg   Bicarbonate 29.6 (H) 20.0 - 28.0 mmol/L   TCO2 32 22 - 32 mmol/L   O2 Saturation 99.0 %   Patient temperature 98.6 F    Collection site RADIAL, ALLEN'S TEST ACCEPTABLE    Drawn by RT    Sample type ARTERIAL    Comment NOTIFIED PHYSICIAN   Glucose, capillary     Status: Abnormal   Collection Time: 02/02/17 12:19 PM  Result Value Ref Range   Glucose-Capillary 104 (H) 65 - 99 mg/dL   Comment 1 Notify RN   Magnesium     Status: None   Collection Time: 02/02/17  4:20 PM  Result Value Ref Range   Magnesium 1.7 1.7 - 2.4 mg/dL  Phosphorus     Status: Abnormal   Collection Time: 02/02/17  4:20 PM  Result Value Ref Range   Phosphorus 1.8 (L) 2.5 - 4.6 mg/dL  Glucose, capillary     Status: Abnormal   Collection Time: 02/02/17  4:49 PM  Result Value Ref Range   Glucose-Capillary 160 (H) 65 - 99 mg/dL   Comment 1 Notify RN   I-STAT 3, arterial blood gas (G3+)     Status: Abnormal   Collection Time: 02/02/17  4:59 PM  Result Value Ref Range   pH, Arterial 7.337 (L) 7.350 - 7.450   pCO2 arterial 50.8 (H) 32.0 - 48.0 mmHg   pO2, Arterial 63.0 (L) 83.0 - 108.0 mmHg   Bicarbonate 27.2 20.0 - 28.0 mmol/L    TCO2 29 22 - 32 mmol/L   O2 Saturation 90.0 %   Acid-Base Excess 1.0 0.0 - 2.0 mmol/L   Patient temperature 98.4 F    Collection site RADIAL,  ALLEN'S TEST ACCEPTABLE    Drawn by RT    Sample type ARTERIAL   Glucose, capillary     Status: Abnormal   Collection Time: 02/02/17  8:34 PM  Result Value Ref Range   Glucose-Capillary 171 (H) 65 - 99 mg/dL   Comment 1 Capillary Specimen    Comment 2 Notify RN   Glucose, capillary     Status: Abnormal   Collection Time: 02/02/17 11:30 PM  Result Value Ref Range   Glucose-Capillary 164 (H) 65 - 99 mg/dL   Comment 1 Capillary Specimen    Comment 2 Notify RN   Comprehensive metabolic panel     Status: Abnormal   Collection Time: 02/03/17  2:01 AM  Result Value Ref Range   Sodium 134 (L) 135 - 145 mmol/L   Potassium 4.0 3.5 - 5.1 mmol/L   Chloride 95 (L) 101 - 111 mmol/L   CO2 29 22 - 32 mmol/L   Glucose, Bld 170 (H) 65 - 99 mg/dL   BUN 40 (H) 6 - 20 mg/dL   Creatinine, Ser 1.30 (H) 0.61 - 1.24 mg/dL   Calcium 8.0 (L) 8.9 - 10.3 mg/dL   Total Protein 6.3 (L) 6.5 - 8.1 g/dL   Albumin 2.6 (L) 3.5 - 5.0 g/dL   AST 30 15 - 41 U/L   ALT 12 (L) 17 - 63 U/L   Alkaline Phosphatase 88 38 - 126 U/L   Total Bilirubin 0.8 0.3 - 1.2 mg/dL   GFR calc non Af Amer 48 (L) >60 mL/min   GFR calc Af Amer 55 (L) >60 mL/min    Comment: (NOTE) The eGFR has been calculated using the CKD EPI equation. This calculation has not been validated in all clinical situations. eGFR's persistently <60 mL/min signify possible Chronic Kidney Disease.    Anion gap 10 5 - 15  CBC with Differential/Platelet     Status: Abnormal   Collection Time: 02/03/17  2:01 AM  Result Value Ref Range   WBC 15.9 (H) 4.0 - 10.5 K/uL   RBC 3.97 (L) 4.22 - 5.81 MIL/uL   Hemoglobin 10.4 (L) 13.0 - 17.0 g/dL   HCT 34.2 (L) 39.0 - 52.0 %   MCV 86.1 78.0 - 100.0 fL   MCH 26.2 26.0 - 34.0 pg   MCHC 30.4 30.0 - 36.0 g/dL   RDW 14.7 11.5 - 15.5 %   Platelets 126 (L) 150 - 400 K/uL    Neutrophils Relative % 96 %   Neutro Abs 15.3 (H) 1.7 - 7.7 K/uL   Lymphocytes Relative 3 %   Lymphs Abs 0.4 (L) 0.7 - 4.0 K/uL   Monocytes Relative 1 %   Monocytes Absolute 0.2 0.1 - 1.0 K/uL   Eosinophils Relative 0 %   Eosinophils Absolute 0.0 0.0 - 0.7 K/uL   Basophils Relative 0 %   Basophils Absolute 0.0 0.0 - 0.1 K/uL  Sedimentation rate     Status: Abnormal   Collection Time: 02/03/17  2:01 AM  Result Value Ref Range   Sed Rate 57 (H) 0 - 16 mm/hr  Magnesium     Status: None   Collection Time: 02/03/17  2:01 AM  Result Value Ref Range   Magnesium 1.9 1.7 - 2.4 mg/dL  Phosphorus     Status: Abnormal   Collection Time: 02/03/17  2:01 AM  Result Value Ref Range   Phosphorus 1.5 (L) 2.5 - 4.6 mg/dL  Glucose, capillary     Status: Abnormal   Collection  Time: 02/03/17  3:25 AM  Result Value Ref Range   Glucose-Capillary 146 (H) 65 - 99 mg/dL   Comment 1 Capillary Specimen    Comment 2 Notify RN   Glucose, capillary     Status: Abnormal   Collection Time: 02/03/17  8:07 AM  Result Value Ref Range   Glucose-Capillary 154 (H) 65 - 99 mg/dL   Comment 1 Notify RN   I-STAT 3, arterial blood gas (G3+)     Status: Abnormal   Collection Time: 02/03/17 10:48 AM  Result Value Ref Range   pH, Arterial 7.332 (L) 7.350 - 7.450   pCO2 arterial 56.8 (H) 32.0 - 48.0 mmHg   pO2, Arterial 85.0 83.0 - 108.0 mmHg   Bicarbonate 30.1 (H) 20.0 - 28.0 mmol/L   TCO2 32 22 - 32 mmol/L   O2 Saturation 95.0 %   Acid-Base Excess 3.0 (H) 0.0 - 2.0 mmol/L   Patient temperature HIDE    Collection site RADIAL, ALLEN'S TEST ACCEPTABLE    Drawn by RT    Sample type ARTERIAL   Glucose, capillary     Status: Abnormal   Collection Time: 02/03/17 11:37 AM  Result Value Ref Range   Glucose-Capillary 137 (H) 65 - 99 mg/dL   Comment 1 Notify RN   Magnesium     Status: None   Collection Time: 02/03/17  4:31 PM  Result Value Ref Range   Magnesium 2.1 1.7 - 2.4 mg/dL  Phosphorus     Status: None    Collection Time: 02/03/17  4:31 PM  Result Value Ref Range   Phosphorus 2.8 2.5 - 4.6 mg/dL  Glucose, capillary     Status: Abnormal   Collection Time: 02/03/17  4:47 PM  Result Value Ref Range   Glucose-Capillary 153 (H) 65 - 99 mg/dL   Comment 1 Notify RN   Glucose, capillary     Status: Abnormal   Collection Time: 02/03/17  8:21 PM  Result Value Ref Range   Glucose-Capillary 114 (H) 65 - 99 mg/dL   Comment 1 Capillary Specimen    Comment 2 Notify RN   Glucose, capillary     Status: Abnormal   Collection Time: 02/03/17 11:45 PM  Result Value Ref Range   Glucose-Capillary 125 (H) 65 - 99 mg/dL   Comment 1 Capillary Specimen    Comment 2 Notify RN   Basic metabolic panel     Status: Abnormal   Collection Time: 02/04/17  2:19 AM  Result Value Ref Range   Sodium 138 135 - 145 mmol/L   Potassium 4.7 3.5 - 5.1 mmol/L   Chloride 101 101 - 111 mmol/L   CO2 30 22 - 32 mmol/L   Glucose, Bld 125 (H) 65 - 99 mg/dL   BUN 41 (H) 6 - 20 mg/dL   Creatinine, Ser 0.91 0.61 - 1.24 mg/dL   Calcium 8.0 (L) 8.9 - 10.3 mg/dL   GFR calc non Af Amer >60 >60 mL/min   GFR calc Af Amer >60 >60 mL/min    Comment: (NOTE) The eGFR has been calculated using the CKD EPI equation. This calculation has not been validated in all clinical situations. eGFR's persistently <60 mL/min signify possible Chronic Kidney Disease.    Anion gap 7 5 - 15  Brain natriuretic peptide     Status: Abnormal   Collection Time: 02/04/17  2:19 AM  Result Value Ref Range   B Natriuretic Peptide 485.7 (H) 0.0 - 100.0 pg/mL  Glucose, capillary  Status: Abnormal   Collection Time: 02/04/17  3:34 AM  Result Value Ref Range   Glucose-Capillary 111 (H) 65 - 99 mg/dL   Comment 1 Capillary Specimen    Comment 2 Notify RN   CBC with Differential/Platelet     Status: Abnormal   Collection Time: 02/04/17  6:20 AM  Result Value Ref Range   WBC 15.1 (H) 4.0 - 10.5 K/uL   RBC 3.52 (L) 4.22 - 5.81 MIL/uL   Hemoglobin 9.2 (L) 13.0  - 17.0 g/dL   HCT 30.3 (L) 39.0 - 52.0 %   MCV 86.1 78.0 - 100.0 fL   MCH 26.1 26.0 - 34.0 pg   MCHC 30.4 30.0 - 36.0 g/dL   RDW 15.3 11.5 - 15.5 %   Platelets 146 (L) 150 - 400 K/uL   Neutrophils Relative % 93 %   Neutro Abs 14.1 (H) 1.7 - 7.7 K/uL   Lymphocytes Relative 2 %   Lymphs Abs 0.3 (L) 0.7 - 4.0 K/uL   Monocytes Relative 5 %   Monocytes Absolute 0.7 0.1 - 1.0 K/uL   Eosinophils Relative 0 %   Eosinophils Absolute 0.0 0.0 - 0.7 K/uL   Basophils Relative 0 %   Basophils Absolute 0.0 0.0 - 0.1 K/uL  Glucose, capillary     Status: Abnormal   Collection Time: 02/04/17  8:17 AM  Result Value Ref Range   Glucose-Capillary 140 (H) 65 - 99 mg/dL   Comment 1 Capillary Specimen     ECG   N/A  Telemetry   A-fib with CVR - personally reviewed  Radiology    Dg Pelvis Comp Min 3v  Result Date: 02/02/2017 CLINICAL DATA:  Pelvic fractures. EXAM: [facility]ET PELVIS - 3+ VIEW COMPARISON:  CT scan dated 02/02/2017 and radiographs dated 02/14/2017 FINDINGS: Again noted are multiple fractures of the right ilium with slight displacement noted on the Judet views. No significant displacement of the acetabular components of the fracture. Healed bilateral proximal femoral fractures with hardware in place. IMPRESSION: No appreciable change in alignment or position of the extensive right pelvic fractures. Electronically Signed   By: Lorriane Shire M.D.   On: 02/02/2017 11:20   Dg Chest Port 1 View  Result Date: 02/03/2017 CLINICAL DATA:  Acute respiratory acidosis. Endotracheal tube placement EXAM: PORTABLE CHEST 1 VIEW COMPARISON:  02/02/2017 FINDINGS: Endotracheal tube in good position.  Gastric tube in the stomach. Bibasilar atelectasis has progressed. Small left effusion has progressed. Negative for edema. IMPRESSION: Progressive bibasilar atelectasis and small left effusion. Endotracheal tube in good position. Electronically Signed   By: Franchot Gallo M.D.   On: 02/03/2017 08:38   Dg Chest  Port 1 View  Result Date: 02/02/2017 CLINICAL DATA:  Acute respiratory failure. EXAM: PORTABLE CHEST 1 VIEW COMPARISON:  02/01/2017 and 02/22/2017 FINDINGS: There is no opacification at the left lung base which could represent a moderate left effusion or consolidation in the left lower lobe. There is slight haziness in the left perihilar region extending into the left upper lobe. The right lung is clear. Multiple old right rib fractures and thoracic spine fractures. Multiple lumbar fractures treated with vertebroplasty. Posttraumatic and arthritic changes of proximal right humerus. Endotracheal tube and NG tube appear in good position. Aortic atherosclerosis. Small metallic foreign body in the posterior aspect of the fundus of the stomach, not present on the prior CT scan of 01/25/2016. IMPRESSION: 1. New opacification at the left lung base consistent with either moderate joint effusion or consolidation in the left  lower lobe. 2. New faint left perihilar infiltrate extending into the left upper lobe. 3. Endotracheal tube and NG tube in good position. 4. Small metallic foreign body in the stomach of unknown etiology. Electronically Signed   By: Lorriane Shire M.D.   On: 02/02/2017 11:16   Dg Abd Portable 1v  Result Date: 02/02/2017 CLINICAL DATA:  O G-tube placement EXAM: PORTABLE ABDOMEN - 1 VIEW COMPARISON:  01/26/2016 FINDINGS: Enteric tube tip is in the body of the stomach with side port well below GE junction. Adjacent indeterminate radiodensity projecting over the left upper quadrant of the abdomen measures 7 mm. The bowel gas pattern appears nonobstructed. A moderate stool burden noted throughout the colon. IMPRESSION: 1. Enteric tube tip is in the body of the stomach. Electronically Signed   By: Kerby Moors M.D.   On: 02/02/2017 11:07    Cardiac Studies   N/A  Assessment   Principal Problem:   Acute on chronic respiratory failure with hypoxia (HCC) Active Problems:   COPD with acute  exacerbation (HCC)   PAF (paroxysmal atrial fibrillation) (HCC)   Atrial fibrillation with RVR (HCC)   BPH (benign prostatic hyperplasia)   Pelvic fracture (HCC)   Hip fracture requiring operative repair, right, closed, initial encounter [facility])   Traumatic retroperitoneal hemorrhage   Coronary artery calcification seen on CAT scan   Abnormal nuclear stress test   Plan   1. Amiodarone discontinued - given digoxin for additional rate control. Remains predominantly in a-fib. Looks volume up - worsening left pleural effusion. Will give IV lasix today - check 2D echo - last study was 1 year ago, ?new cardiomyopathy in the setting of PAF.  Time Spent Directly with Patient:  I have spent a total of 25 minutes with the patient reviewing hospital notes, telemetry, EKGs, labs and examining the patient as well as establishing an assessment and plan that was discussed personally with the patient. > 50% of time was spent in direct patient care.  Length of Stay:  LOS: 3 days   Pixie Casino, MD, [facility], [place] Director of the [facility] Attending Cardiologist  Direct Dial: [phone]  Fax: [phone]  Website:  [email or web address] Hilty 02/04/2017, 8:42 AM

## 2017-02-05 ENCOUNTER — Inpatient Hospital Stay (HOSPITAL_COMMUNITY): Payer: Medicare Other

## 2017-02-05 DIAGNOSIS — S32443A Displaced fracture of posterior column [ilioischial] of unspecified acetabulum, initial encounter for closed fracture: Secondary | ICD-10-CM

## 2017-02-05 DIAGNOSIS — S32433A Displaced fracture of anterior column [iliopubic] of unspecified acetabulum, initial encounter for closed fracture: Secondary | ICD-10-CM

## 2017-02-05 DIAGNOSIS — I2510 Atherosclerotic heart disease of native coronary artery without angina pectoris: Secondary | ICD-10-CM

## 2017-02-05 DIAGNOSIS — I4891 Unspecified atrial fibrillation: Secondary | ICD-10-CM

## 2017-02-05 LAB — ECHOCARDIOGRAM COMPLETE
AOASC: 37 cm
CHL CUP MV DEC (S): 218
E/e' ratio: 9.02
EWDT: 218 ms
FS: 37 % (ref 28–44)
Height: 66 in
IV/PV OW: 1.34
LADIAMINDEX: 2.28 cm/m2
LASIZE: 42 mm
LAVOL: 85.2 mL
LAVOLA4C: 87 mL
LAVOLIN: 46.2 mL/m2
LEFT ATRIUM END SYS DIAM: 42 mm
LV E/e'average: 9.02
LV e' LATERAL: 10 cm/s
LVEEMED: 9.02
LVOT area: 3.14 cm2
LVOT diameter: 20 mm
MV pk A vel: 42.2 m/s
MVPG: 3 mmHg
MVPKEVEL: 90.2 m/s
PW: 8.28 mm — AB (ref 0.6–1.1)
RV LATERAL S' VELOCITY: 19.8 cm/s
Reg peak vel: 329 cm/s
TAPSE: 19.4 mm
TDI e' lateral: 10
TDI e' medial: 7.72
TRMAXVEL: 329 cm/s
Weight: 2539.7 oz

## 2017-02-05 LAB — BASIC METABOLIC PANEL
Anion gap: 8 (ref 5–15)
BUN: 32 mg/dL — AB (ref 6–20)
CALCIUM: 8.1 mg/dL — AB (ref 8.9–10.3)
CO2: 32 mmol/L (ref 22–32)
CREATININE: 0.76 mg/dL (ref 0.61–1.24)
Chloride: 98 mmol/L — ABNORMAL LOW (ref 101–111)
GFR calc Af Amer: >60 mL/min (ref >60)
GLUCOSE: 102 mg/dL — AB (ref 65–99)
Potassium: 4.8 mmol/L (ref 3.5–5.1)
Sodium: 138 mmol/L (ref 135–145)

## 2017-02-05 LAB — CBC WITH DIFFERENTIAL/PLATELET
BASOS ABS: 0 10*3/uL (ref 0.0–0.1)
BASOS PCT: 0 %
EOS PCT: 0 %
Eosinophils Absolute: 0 10*3/uL (ref 0.0–0.7)
HEMATOCRIT: 30.8 % — AB (ref 39.0–52.0)
Hemoglobin: 9.1 g/dL — ABNORMAL LOW (ref 13.0–17.0)
Lymphocytes Relative: 4 %
Lymphs Abs: 0.5 10*3/uL — ABNORMAL LOW (ref 0.7–4.0)
MCH: 25.5 pg — ABNORMAL LOW (ref 26.0–34.0)
MCHC: 29.5 g/dL — AB (ref 30.0–36.0)
MCV: 86.3 fL (ref 78.0–100.0)
MONO ABS: 0.4 10*3/uL (ref 0.1–1.0)
MONOS PCT: 3 %
NEUTROS ABS: 10.8 10*3/uL — AB (ref 1.7–7.7)
Neutrophils Relative %: 93 %
PLATELETS: 146 10*3/uL — AB (ref 150–400)
RBC: 3.57 MIL/uL — ABNORMAL LOW (ref 4.22–5.81)
RDW: 15.5 % (ref 11.5–15.5)
WBC: 11.7 10*3/uL — ABNORMAL HIGH (ref 4.0–10.5)

## 2017-02-05 LAB — GLUCOSE, CAPILLARY
GLUCOSE-CAPILLARY: 102 mg/dL — AB (ref 65–99)
GLUCOSE-CAPILLARY: 104 mg/dL — AB (ref 65–99)
GLUCOSE-CAPILLARY: 142 mg/dL — AB (ref 65–99)
Glucose-Capillary: 110 mg/dL — ABNORMAL HIGH (ref 65–99)
Glucose-Capillary: 98 mg/dL (ref 65–99)

## 2017-02-05 MED ORDER — PERFLUTREN LIPID MICROSPHERE
INTRAVENOUS | Status: AC
  Administered 2017-02-05: 4 mL via INTRAVENOUS
  Filled 2017-02-05: qty 10

## 2017-02-05 MED ORDER — CHLORHEXIDINE GLUCONATE 0.12 % MT SOLN
OROMUCOSAL | Status: AC
Start: 2017-02-05 — End: 2017-02-05
  Filled 2017-02-05: qty 15

## 2017-02-05 MED ORDER — PERFLUTREN LIPID MICROSPHERE
1.0000 mL | INTRAVENOUS | Status: AC | PRN
  Administered 2017-02-05: 4 mL via INTRAVENOUS
  Filled 2017-02-05 (×2): qty 10

## 2017-02-05 MED ORDER — FUROSEMIDE 10 MG/ML IJ SOLN
40.0000 mg | Freq: Once | INTRAMUSCULAR | Status: AC
  Administered 2017-02-05: 40 mg via INTRAVENOUS
  Filled 2017-02-05: qty 4

## 2017-02-05 MED ORDER — MIDAZOLAM HCL 2 MG/2ML IJ SOLN
2.0000 mg | Freq: Once | INTRAMUSCULAR | Status: AC
Start: 2017-02-05 — End: 2017-02-07
  Administered 2017-02-07: 2 mg via INTRAVENOUS

## 2017-02-05 MED ORDER — PERFLUTREN LIPID MICROSPHERE
INTRAVENOUS | Status: AC
  Filled 2017-02-05: qty 10

## 2017-02-05 MED ORDER — FENTANYL BOLUS VIA INFUSION
100.0000 ug | INTRAVENOUS | Status: DC | PRN
  Administered 2017-02-06: 100 ug via INTRAVENOUS
  Administered 2017-02-06 – 2017-02-07 (×4): 50 ug via INTRAVENOUS
  Administered 2017-02-08 – 2017-02-12 (×10): 100 ug via INTRAVENOUS
  Filled 2017-02-05: qty 100

## 2017-02-05 MED ORDER — DEXMEDETOMIDINE HCL IN NACL 400 MCG/100ML IV SOLN
0.4000 ug/kg/h | INTRAVENOUS | Status: DC
  Administered 2017-02-05: 0.8 ug/kg/h via INTRAVENOUS
  Administered 2017-02-05: 0.4 ug/kg/h via INTRAVENOUS
  Administered 2017-02-06 (×2): 0.5 ug/kg/h via INTRAVENOUS
  Administered 2017-02-07: 1 ug/kg/h via INTRAVENOUS
  Administered 2017-02-07: 0.5 ug/kg/h via INTRAVENOUS
  Administered 2017-02-07: 1.1 ug/kg/h via INTRAVENOUS
  Administered 2017-02-07 – 2017-02-09 (×6): 0.7 ug/kg/h via INTRAVENOUS
  Administered 2017-02-11 (×2): 0.4 ug/kg/h via INTRAVENOUS
  Administered 2017-02-12: 0.3 ug/kg/h via INTRAVENOUS
  Filled 2017-02-05 (×19): qty 100

## 2017-02-05 NOTE — Progress Notes (Signed)
  Echocardiogram 2D Echocardiogram has been performed.  Neil Terry [name] 02/05/2017, 11:35 AM

## 2017-02-05 NOTE — Progress Notes (Signed)
DAILY PROGRESS NOTE   Patient Name: Neil Terry Date of Encounter: 02/05/2017  Chief Complaint   Intubated, sedated on vent  Patient Profile   Neil Terry is a 83 y.o. male with a hx of atrial fibrillation who is being seen today for the evaluation of atrial fibrillation w RVR at the request of Dr. Evangeline Gula.  Subjective   Diursed 800 cc negative yesterday - creatinine stable. Echo pending. Evaluated for incisional hernia - CT abdomen suggests bilateral lower lobe pneumonia with small pleural effusions, complex ventral abdominal hernia without incarceration or bowel obstruction.  Objective   Vitals:   02/05/17 0600 02/05/17 0700 02/05/17 0753 02/05/17 0755  BP: 125/88   116/80  Pulse: 65 63    Resp: 16 18    Temp:    100.1 F (37.8 C)  TempSrc:    Axillary  SpO2: 98% 95% 95%   Weight:      Height:        Intake/Output Summary (Last 24 hours) at 02/05/2017 0630 Last data filed at 02/05/2017 0700 Gross per 24 hour  Intake 1636.25 ml  Output 2650 ml  Net -1013.75 ml   Filed Weights   02/03/17 0401 02/04/17 0400 02/05/17 0500  Weight: 159 lb 6.3 oz (72.3 kg) 159 lb 6.3 oz (72.3 kg) 158 lb 11.7 oz (72 kg)    Physical Exam   General appearance: intubated, sedated on vent, opens eyes to voice Neck: no carotid bruit, no JVD and thyroid not enlarged, symmetric, no tenderness/mass/nodules Lungs: diminished breath sounds bilaterally, dullness to percussion LLL and rales bibasilar Heart: irregularly irregular rhythm and S1, S2 normal Abdomen: soft, non-tender; bowel sounds normal; no masses,  no organomegaly Extremities: extremities normal, atraumatic, no cyanosis or edema Pulses: 2+ and symmetric Skin: Skin color, texture, turgor normal. No rashes or lesions Neurologic: Mental status: intubated, lightly sedated on vent, opens eyes to voice Psych: Cannot assess  Inpatient Medications    Scheduled Meds: . acetaminophen  500 mg Oral BID  . chlorhexidine gluconate  (MEDLINE KIT)  15 mL Mouth Rinse BID  . cholecalciferol  2,000 Units Oral Daily  . digoxin  0.125 mg Oral Daily  . fentaNYL (SUBLIMAZE) injection  50 mcg Intravenous Once  . gabapentin  300 mg Oral TID  . insulin aspart  0-9 Units Subcutaneous Q4H  . ipratropium  0.5 mg Nebulization TID  . levalbuterol  0.63 mg Nebulization TID  . mouth rinse  15 mL Mouth Rinse 10 times per day  . methylPREDNISolone (SOLU-MEDROL) injection  40 mg Intravenous Q24H  . midazolam  2 mg Intravenous Once  . mometasone-formoterol  2 puff Inhalation BID  . pantoprazole sodium  40 mg Per Tube Daily  . polyethylene glycol  17 g Oral Daily  . potassium chloride  10 mEq Oral Daily  . pramipexole  0.25 mg Oral QHS  . sennosides  5 mL Oral BID  . sertraline  50 mg Oral QHS  . tamsulosin  0.4 mg Oral QPC supper    Continuous Infusions: . sodium chloride 10 mL/hr at 02/05/17 0000  . cefTRIAXone (ROCEPHIN)  IV Stopped (02/04/17 1128)  . feeding supplement (VITAL AF 1.2 CAL) Stopped (02/04/17 0900)  . fentaNYL infusion INTRAVENOUS 300 mcg/hr (02/05/17 0230)    PRN Meds: fentaNYL, fentaNYL (SUBLIMAZE) injection, midazolam   Labs   Results for orders placed or performed during the hospital encounter of 02/14/2017 (from the past 48 hour(s))  I-STAT 3, arterial blood gas (G3+)  Status: Abnormal   Collection Time: 02/03/17 10:48 AM  Result Value Ref Range   pH, Arterial 7.332 (L) 7.350 - 7.450   pCO2 arterial 56.8 (H) 32.0 - 48.0 mmHg   pO2, Arterial 85.0 83.0 - 108.0 mmHg   Bicarbonate 30.1 (H) 20.0 - 28.0 mmol/L   TCO2 32 22 - 32 mmol/L   O2 Saturation 95.0 %   Acid-Base Excess 3.0 (H) 0.0 - 2.0 mmol/L   Patient temperature HIDE    Collection site RADIAL, ALLEN'S TEST ACCEPTABLE    Drawn by RT    Sample type ARTERIAL   Glucose, capillary     Status: Abnormal   Collection Time: 02/03/17 11:37 AM  Result Value Ref Range   Glucose-Capillary 137 (H) 65 - 99 mg/dL   Comment 1 Notify RN   Magnesium      Status: None   Collection Time: 02/03/17  4:31 PM  Result Value Ref Range   Magnesium 2.1 1.7 - 2.4 mg/dL  Phosphorus     Status: None   Collection Time: 02/03/17  4:31 PM  Result Value Ref Range   Phosphorus 2.8 2.5 - 4.6 mg/dL  Glucose, capillary     Status: Abnormal   Collection Time: 02/03/17  4:47 PM  Result Value Ref Range   Glucose-Capillary 153 (H) 65 - 99 mg/dL   Comment 1 Notify RN   Glucose, capillary     Status: Abnormal   Collection Time: 02/03/17  8:21 PM  Result Value Ref Range   Glucose-Capillary 114 (H) 65 - 99 mg/dL   Comment 1 Capillary Specimen    Comment 2 Notify RN   Glucose, capillary     Status: Abnormal   Collection Time: 02/03/17 11:45 PM  Result Value Ref Range   Glucose-Capillary 125 (H) 65 - 99 mg/dL   Comment 1 Capillary Specimen    Comment 2 Notify RN   Basic metabolic panel     Status: Abnormal   Collection Time: 02/04/17  2:19 AM  Result Value Ref Range   Sodium 138 135 - 145 mmol/L   Potassium 4.7 3.5 - 5.1 mmol/L   Chloride 101 101 - 111 mmol/L   CO2 30 22 - 32 mmol/L   Glucose, Bld 125 (H) 65 - 99 mg/dL   BUN 41 (H) 6 - 20 mg/dL   Creatinine, Ser 0.91 0.61 - 1.24 mg/dL   Calcium 8.0 (L) 8.9 - 10.3 mg/dL   GFR calc non Af Amer >60 >60 mL/min   GFR calc Af Amer >60 >60 mL/min    Comment: (NOTE) The eGFR has been calculated using the CKD EPI equation. This calculation has not been validated in all clinical situations. eGFR's persistently <60 mL/min signify possible Chronic Kidney Disease.    Anion gap 7 5 - 15  Brain natriuretic peptide     Status: Abnormal   Collection Time: 02/04/17  2:19 AM  Result Value Ref Range   B Natriuretic Peptide 485.7 (H) 0.0 - 100.0 pg/mL  Glucose, capillary     Status: Abnormal   Collection Time: 02/04/17  3:34 AM  Result Value Ref Range   Glucose-Capillary 111 (H) 65 - 99 mg/dL   Comment 1 Capillary Specimen    Comment 2 Notify RN   CBC with Differential/Platelet     Status: Abnormal   Collection  Time: 02/04/17  6:20 AM  Result Value Ref Range   WBC 15.1 (H) 4.0 - 10.5 K/uL   RBC 3.52 (L) 4.22 - 5.81 MIL/uL  Hemoglobin 9.2 (L) 13.0 - 17.0 g/dL   HCT 30.3 (L) 39.0 - 52.0 %   MCV 86.1 78.0 - 100.0 fL   MCH 26.1 26.0 - 34.0 pg   MCHC 30.4 30.0 - 36.0 g/dL   RDW 15.3 11.5 - 15.5 %   Platelets 146 (L) 150 - 400 K/uL   Neutrophils Relative % 93 %   Neutro Abs 14.1 (H) 1.7 - 7.7 K/uL   Lymphocytes Relative 2 %   Lymphs Abs 0.3 (L) 0.7 - 4.0 K/uL   Monocytes Relative 5 %   Monocytes Absolute 0.7 0.1 - 1.0 K/uL   Eosinophils Relative 0 %   Eosinophils Absolute 0.0 0.0 - 0.7 K/uL   Basophils Relative 0 %   Basophils Absolute 0.0 0.0 - 0.1 K/uL  Glucose, capillary     Status: Abnormal   Collection Time: 02/04/17  8:17 AM  Result Value Ref Range   Glucose-Capillary 140 (H) 65 - 99 mg/dL   Comment 1 Capillary Specimen   Glucose, capillary     Status: Abnormal   Collection Time: 02/04/17 12:08 PM  Result Value Ref Range   Glucose-Capillary 104 (H) 65 - 99 mg/dL   Comment 1 Capillary Specimen   Glucose, capillary     Status: None   Collection Time: 02/04/17  4:05 PM  Result Value Ref Range   Glucose-Capillary 94 65 - 99 mg/dL   Comment 1 Capillary Specimen   Glucose, capillary     Status: None   Collection Time: 02/04/17  7:51 PM  Result Value Ref Range   Glucose-Capillary 95 65 - 99 mg/dL   Comment 1 Capillary Specimen   Glucose, capillary     Status: None   Collection Time: 02/04/17 11:51 PM  Result Value Ref Range   Glucose-Capillary 93 65 - 99 mg/dL   Comment 1 Capillary Specimen   Glucose, capillary     Status: Abnormal   Collection Time: 02/05/17  3:54 AM  Result Value Ref Range   Glucose-Capillary 110 (H) 65 - 99 mg/dL   Comment 1 Capillary Specimen   Basic metabolic panel     Status: Abnormal   Collection Time: 02/05/17  4:22 AM  Result Value Ref Range   Sodium 138 135 - 145 mmol/L   Potassium 4.8 3.5 - 5.1 mmol/L   Chloride 98 (L) 101 - 111 mmol/L   CO2 32  22 - 32 mmol/L   Glucose, Bld 102 (H) 65 - 99 mg/dL   BUN 32 (H) 6 - 20 mg/dL   Creatinine, Ser 0.76 0.61 - 1.24 mg/dL   Calcium 8.1 (L) 8.9 - 10.3 mg/dL   GFR calc non Af Amer >60 >60 mL/min   GFR calc Af Amer >60 >60 mL/min    Comment: (NOTE) The eGFR has been calculated using the CKD EPI equation. This calculation has not been validated in all clinical situations. eGFR's persistently <60 mL/min signify possible Chronic Kidney Disease.    Anion gap 8 5 - 15  CBC with Differential/Platelet     Status: Abnormal   Collection Time: 02/05/17  4:22 AM  Result Value Ref Range   WBC 11.7 (H) 4.0 - 10.5 K/uL   RBC 3.57 (L) 4.22 - 5.81 MIL/uL   Hemoglobin 9.1 (L) 13.0 - 17.0 g/dL   HCT 30.8 (L) 39.0 - 52.0 %   MCV 86.3 78.0 - 100.0 fL   MCH 25.5 (L) 26.0 - 34.0 pg   MCHC 29.5 (L) 30.0 - 36.0 g/dL  RDW 15.5 11.5 - 15.5 %   Platelets 146 (L) 150 - 400 K/uL   Neutrophils Relative % 93 %   Neutro Abs 10.8 (H) 1.7 - 7.7 K/uL   Lymphocytes Relative 4 %   Lymphs Abs 0.5 (L) 0.7 - 4.0 K/uL   Monocytes Relative 3 %   Monocytes Absolute 0.4 0.1 - 1.0 K/uL   Eosinophils Relative 0 %   Eosinophils Absolute 0.0 0.0 - 0.7 K/uL   Basophils Relative 0 %   Basophils Absolute 0.0 0.0 - 0.1 K/uL  Glucose, capillary     Status: None   Collection Time: 02/05/17  8:00 AM  Result Value Ref Range   Glucose-Capillary 98 65 - 99 mg/dL   Comment 1 Capillary Specimen     ECG   N/A  Telemetry   Sinus rhythm in the 70's - personally reviewed  Radiology    Ct Chest W Contrast  Result Date: 02/04/2017 CLINICAL DATA:  83 year old male status post fall with complex right pelvic fracture. Intubated for respiratory failure. Incisional hernia from prior laparotomy. EXAM: CT CHEST, ABDOMEN, AND PELVIS WITH CONTRAST TECHNIQUE: Multidetector CT imaging of the chest, abdomen and pelvis was performed following the standard protocol during bolus administration of intravenous contrast. CONTRAST:  100 mL  Omnipaque 300 COMPARISON:  CTA chest 02/01/2017. Pelvis CT 02/19/2017. CT Abdomen and Pelvis 01/25/2016. FINDINGS: CT CHEST FINDINGS Cardiovascular: Stable cardiomegaly. Stable tortuosity of the thoracic aorta. No pericardial effusion. Mediastinum/Nodes: NG tube courses in the esophagus to the stomach. Small volume of oral contrast in the mid and distal esophagus. No lymphadenopathy. Lungs/Pleura: Endotracheal tube in place, terminates between the clavicles and carina. Major airways are patent, but bilateral lower lobe consolidation has developed since 02/01/2017 at the entire left lower lobe is consolidated. The superior segment of the right lower lobe is spared at this time. There is new patchy peribronchial opacity in the lingula and left upper lobe. The right upper lobe and middle lobe are spared at this time. New small Volume bilateral pleural effusions, mostly sub pulmonic on the right. Musculoskeletal: Osteopenia. Diffuse thoracic compression fractures except at the T1 and T4 levels. Chronic or congenital ankylosis of T11-T12. Sternum intact. Chronic left anterior first rib fracture. Un healed right seconds anterolateral rib fracture is subacute or chronic. Associated nondisplaced fractures of the right anterolateral third through eighth ribs which also appear stable and demonstrates some callus formation. Superimposed chronic right posterior third and ninth rib fractures. There is an acute nondisplaced left anterolateral third rib fracture on series 4, image 79. Superimposed subacute or chronic left fourth through sixth lateral rib fractures are stable. Chronic proximal right humerus deformity is stable. CT ABDOMEN PELVIS FINDINGS Hepatobiliary: Dilated gallbladder up to 5.5 cm diameter but no pericholecystic inflammation. Trace perihepatic fluid. Mild periportal edema. Stable liver parenchyma. Pancreas: Negative. Spleen: Trace perisplenic fluid. Adrenals/Urinary Tract: Normal adrenal glands. Bilateral renal  enhancement and contrast excretion appears symmetric and within normal limits. Mild perinephric stranding but otherwise both kidneys appear not significantly changed since 2017. The urinary bladder is decompressed by a Foley catheter, but the collapsed bladder parenchyma is layering along the right retroperitoneum in the pelvis as seen on series 3, image 94. No bladder hemorrhage suspected. Stomach/Bowel: Stable distal large bowel. Redundant sigmoid colon. Redundant and stool containing but nondilated left colon. Rectus muscle diastases with broad-based ventral abdominal hernia containing both small and large bowel loops. The superior aspect of the herniated bowel appears largely free and reducible. There is a more  in capsulated appearance of the caudal aspect of the hernia containing small bowel loops as seen on sagittal image 96, with a hernia sac up to 16 cm and a hernia neck of 2-3 cm see coronal image 30. Still, no evidence of incarceration at this time. Redundant right colon with retained stool. Small volume right pericolic gutter hematoma. The cecum is on a lax mesentery. Nondilated terminal ileum. Oral contrast has not yet reached the distal small bowel. No dilated small bowel. Enteric tube in the distal stomach, tip located near a chronic gastrojejunostomy. The distal stomach and duodenum C-loop appear to be surgically absent. There is a 9-10 mm square appearing highly dense metallic object located dependently in the stomach which is new since 2017. This was seen on 02/02/2017 radiographs. Vascular/Lymphatic: Aortoiliac calcified atherosclerosis. Ectatic infrarenal abdominal aorta up to 28 mm diameter. Major arterial structures in the abdomen and pelvis remain patent including the right iliac and proximal femoral arteries. Portal venous system appears to be patent. No lymphadenopathy. Reproductive:  Negative. Other: Right iliopsoas and right greater than left retroperitoneal hematoma has progressed since  02/04/2017. Hematoma tracks along the bilateral common iliac vessels greater on the right (series 3 image 86). Mild regional mass effect. Mild but increased presacral stranding.  No pelvic free fluid. Musculoskeletal: Chronic lumbar spine compression fractures sparing the L5 level are stable since 2017. L1 through L3 previously augmented. Severely comminuted fracture of the right iliac wing and acetabulum with up to moderate displacement of fragments and evidence of intraosseous hematoma in the right iliac wing has not significantly changed since 02/16/2017. Associated right pubic rami fractures. Preexisting bilateral proximal femur ORIF the sacrum and SI joints appear to remain intact. No new No acute osseous abnormality identified. IMPRESSION: 1. Bilateral lower lobe pneumonia with small pleural effusions has developed since 02/01/2017, more extensive on the left. 2. Superimposed developing bronchopneumonia suspected in the left lingula and upper lobe. 3. Complex, comminuted right hemipelvis fracture with intraosseous hematoma, and a mild to moderate volume of iliopsoas and retroperitoneal hematoma which has increased since 02/01/2017. Only mild mass effect on abdominal and pelvic viscera at this time. 4. Complex ventral abdominal hernia, the cephalad extent of which appears easily reducible while the caudal extent is more pedunculated and contains small bowel. No evidence of incarceration at this time. No bowel obstruction suspected. 5. Acute nondisplaced left anterior third rib fracture. Numerous superimposed bilateral subacute and/or chronic rib fractures. Widespread non-acute spinal compression fractures. 6. Dilated gallbladder and mild periportal edema. But no strong CT evidence of acute cholecystitis. 7. ET tube and enteric tube in place. The enteric tube tip terminates at a gastrojejunostomy. 8. Metallic highly dense ingested foreign body located dependently in the stomach remnant. Perhaps this is a piece  of dental amalgam. 9. Cardiomegaly. Ectatic abdominal aorta at risk for aneurysm development. Recommend followup by ultrasound in 5 years. This recommendation follows ACR consensus guidelines: White Paper of the ACR Incidental Findings Committee II on Vascular Findings. J Am Coll Radiol 2013; 10:789-794. Electronically Signed   By: Genevie Ann M.D.   On: 02/04/2017 15:52   Ct Abdomen Pelvis W Contrast  Result Date: 02/04/2017 CLINICAL DATA:  83 year old male status post fall with complex right pelvic fracture. Intubated for respiratory failure. Incisional hernia from prior laparotomy. EXAM: CT CHEST, ABDOMEN, AND PELVIS WITH CONTRAST TECHNIQUE: Multidetector CT imaging of the chest, abdomen and pelvis was performed following the standard protocol during bolus administration of intravenous contrast. CONTRAST:  100 mL Omnipaque 300  COMPARISON:  CTA chest 02/01/2017. Pelvis CT 02/24/2017. CT Abdomen and Pelvis 01/25/2016. FINDINGS: CT CHEST FINDINGS Cardiovascular: Stable cardiomegaly. Stable tortuosity of the thoracic aorta. No pericardial effusion. Mediastinum/Nodes: NG tube courses in the esophagus to the stomach. Small volume of oral contrast in the mid and distal esophagus. No lymphadenopathy. Lungs/Pleura: Endotracheal tube in place, terminates between the clavicles and carina. Major airways are patent, but bilateral lower lobe consolidation has developed since 02/01/2017 at the entire left lower lobe is consolidated. The superior segment of the right lower lobe is spared at this time. There is new patchy peribronchial opacity in the lingula and left upper lobe. The right upper lobe and middle lobe are spared at this time. New small Volume bilateral pleural effusions, mostly sub pulmonic on the right. Musculoskeletal: Osteopenia. Diffuse thoracic compression fractures except at the T1 and T4 levels. Chronic or congenital ankylosis of T11-T12. Sternum intact. Chronic left anterior first rib fracture. Un healed  right seconds anterolateral rib fracture is subacute or chronic. Associated nondisplaced fractures of the right anterolateral third through eighth ribs which also appear stable and demonstrates some callus formation. Superimposed chronic right posterior third and ninth rib fractures. There is an acute nondisplaced left anterolateral third rib fracture on series 4, image 79. Superimposed subacute or chronic left fourth through sixth lateral rib fractures are stable. Chronic proximal right humerus deformity is stable. CT ABDOMEN PELVIS FINDINGS Hepatobiliary: Dilated gallbladder up to 5.5 cm diameter but no pericholecystic inflammation. Trace perihepatic fluid. Mild periportal edema. Stable liver parenchyma. Pancreas: Negative. Spleen: Trace perisplenic fluid. Adrenals/Urinary Tract: Normal adrenal glands. Bilateral renal enhancement and contrast excretion appears symmetric and within normal limits. Mild perinephric stranding but otherwise both kidneys appear not significantly changed since 2017. The urinary bladder is decompressed by a Foley catheter, but the collapsed bladder parenchyma is layering along the right retroperitoneum in the pelvis as seen on series 3, image 94. No bladder hemorrhage suspected. Stomach/Bowel: Stable distal large bowel. Redundant sigmoid colon. Redundant and stool containing but nondilated left colon. Rectus muscle diastases with broad-based ventral abdominal hernia containing both small and large bowel loops. The superior aspect of the herniated bowel appears largely free and reducible. There is a more in capsulated appearance of the caudal aspect of the hernia containing small bowel loops as seen on sagittal image 96, with a hernia sac up to 16 cm and a hernia neck of 2-3 cm see coronal image 30. Still, no evidence of incarceration at this time. Redundant right colon with retained stool. Small volume right pericolic gutter hematoma. The cecum is on a lax mesentery. Nondilated terminal  ileum. Oral contrast has not yet reached the distal small bowel. No dilated small bowel. Enteric tube in the distal stomach, tip located near a chronic gastrojejunostomy. The distal stomach and duodenum C-loop appear to be surgically absent. There is a 9-10 mm square appearing highly dense metallic object located dependently in the stomach which is new since 2017. This was seen on 02/02/2017 radiographs. Vascular/Lymphatic: Aortoiliac calcified atherosclerosis. Ectatic infrarenal abdominal aorta up to 28 mm diameter. Major arterial structures in the abdomen and pelvis remain patent including the right iliac and proximal femoral arteries. Portal venous system appears to be patent. No lymphadenopathy. Reproductive:  Negative. Other: Right iliopsoas and right greater than left retroperitoneal hematoma has progressed since 02/25/2017. Hematoma tracks along the bilateral common iliac vessels greater on the right (series 3 image 86). Mild regional mass effect. Mild but increased presacral stranding.  No pelvic free fluid.  Musculoskeletal: Chronic lumbar spine compression fractures sparing the L5 level are stable since 2017. L1 through L3 previously augmented. Severely comminuted fracture of the right iliac wing and acetabulum with up to moderate displacement of fragments and evidence of intraosseous hematoma in the right iliac wing has not significantly changed since 02/14/2017. Associated right pubic rami fractures. Preexisting bilateral proximal femur ORIF the sacrum and SI joints appear to remain intact. No new No acute osseous abnormality identified. IMPRESSION: 1. Bilateral lower lobe pneumonia with small pleural effusions has developed since 02/01/2017, more extensive on the left. 2. Superimposed developing bronchopneumonia suspected in the left lingula and upper lobe. 3. Complex, comminuted right hemipelvis fracture with intraosseous hematoma, and a mild to moderate volume of iliopsoas and retroperitoneal hematoma  which has increased since 02/01/2017. Only mild mass effect on abdominal and pelvic viscera at this time. 4. Complex ventral abdominal hernia, the cephalad extent of which appears easily reducible while the caudal extent is more pedunculated and contains small bowel. No evidence of incarceration at this time. No bowel obstruction suspected. 5. Acute nondisplaced left anterior third rib fracture. Numerous superimposed bilateral subacute and/or chronic rib fractures. Widespread non-acute spinal compression fractures. 6. Dilated gallbladder and mild periportal edema. But no strong CT evidence of acute cholecystitis. 7. ET tube and enteric tube in place. The enteric tube tip terminates at a gastrojejunostomy. 8. Metallic highly dense ingested foreign body located dependently in the stomach remnant. Perhaps this is a piece of dental amalgam. 9. Cardiomegaly. Ectatic abdominal aorta at risk for aneurysm development. Recommend followup by ultrasound in 5 years. This recommendation follows ACR consensus guidelines: White Paper of the ACR Incidental Findings Committee II on Vascular Findings. J Am Coll Radiol 2013; 10:789-794. Electronically Signed   By: Genevie Ann M.D.   On: 02/04/2017 15:52   Dg Chest Port 1 View  Result Date: 02/05/2017 CLINICAL DATA:  Respiratory failure, intubated patient, bilateral pneumonia. EXAM: PORTABLE CHEST 1 VIEW COMPARISON:  Portable chest x-ray of February 04, 2017 and chest CT scan of the same day. FINDINGS: The lungs are reasonably well inflated. The interstitial markings remain increased. Confluent airspace opacities are present at both bases and appears stable. There is a small left pleural effusion. The cardiac silhouette is enlarged. The central pulmonary vascularity is prominent. There is calcification in the wall of the aortic arch. The endotracheal tube tip projects approximately 3.3 cm above the carina. The esophagogastric tube tip terminates just below the GE junction with the  proximal port in the distal esophagus. IMPRESSION: Bibasilar pneumonia with small left pleural effusion. Probable low-grade CHF. Fairly stable appearance since yesterday's study. High positioning of the esophagogastric tube. Advancement by 5-10 cm is recommended. Thoracic aortic atherosclerosis. Electronically Signed   By: David  Martinique M.D.   On: 02/05/2017 08:08   Dg Chest Port 1 View  Result Date: 02/04/2017 CLINICAL DATA:  Check endotracheal tube placement EXAM: PORTABLE CHEST 1 VIEW COMPARISON:  02/03/2017 FINDINGS: Endotracheal tube and nasogastric catheter are noted in satisfactory position. Cardiac shadow is enlarged but stable. Aortic calcifications are again seen. Small bilateral pleural effusions are noted left greater than right with patchy infiltrate in the bases bilaterally. The overall appearance is stable from the prior exam. Changes of prior vertebral augmentation are noted. IMPRESSION: Bibasilar changes as described stable from the prior exam. Electronically Signed   By: Inez Catalina M.D.   On: 02/04/2017 08:59    Cardiac Studies   Echo pending  Assessment   Principal Problem:  Acute on chronic respiratory failure with hypoxia (HCC) Active Problems:   COPD with acute exacerbation (HCC)   PAF (paroxysmal atrial fibrillation) (HCC)   Atrial fibrillation with RVR (HCC)   BPH (benign prostatic hyperplasia)   Pelvic fracture (HCC)   Hip fracture requiring operative repair, right, closed, initial encounter [facility])   Traumatic retroperitoneal hemorrhage   Coronary artery calcification seen on CAT scan   Abnormal nuclear stress test   Plan   1. In sinus rhythm this am - has been in and out of AF- amiodarone on hold. CT suggests bilateral pneumonia with small pleural effusions. Diuresed about 800 cc  Yesterday - will give additional 40 mg IV lasix today. Creatinine stable after diuresis yesterday.  Time Spent Directly with Patient:  I have spent a total of 25 minutes with the  patient reviewing hospital notes, telemetry, EKGs, labs and examining the patient as well as establishing an assessment and plan that was discussed personally with the patient. > 50% of time was spent in direct patient care.  Length of Stay:  LOS: 4 days   Pixie Casino, MD, [facility], [place] Director of the [facility] Attending Cardiologist  Direct Dial: [phone]  Fax: [phone]  Website:  [email or web address] 02/05/2017, 8:28 AM

## 2017-02-05 NOTE — Progress Notes (Signed)
PULMONARY / CRITICAL CARE MEDICINE   Name: Neil Terry MRN: [id number] DOB: [birth year]    ADMISSION DATE:  02/19/2017 CONSULTATION DATE:  02/01/2017  REFERRING MD:  Brett Canales  CHIEF COMPLAINT: We are asked to consult for increasing oxygen requirement and atrial fibrillation with rapid ventricular response  HISTORY OF PRESENT ILLNESS:   This is an 83 year old with oxygen dependent COPD at baseline who suffered from a ground-level fall yesterday.  Since admission he has developed increasing oxygen requirements and is developed atrial fibrillation with rapid ventricular response.  An amiodarone infusion was initiated this morning and he is already responded with a heart rate in the 80s during my examination.  He denies any increase in his usual cough or any subjective increase in dyspnea.  He is not having any sort of chest pain including including pleuritic chest pain.   SUBJECTIVE:  -1200 cc over 24 hours.  Unable to wean secondary to pain and need for high dose narcotics.  VITAL SIGNS: BP 116/80   Pulse 73   Temp 100.1 F (37.8 C) (Axillary)   Resp 18   Ht 5\' 6"  (1.676 m)   Wt 158 lb 11.7 oz (72 kg)   SpO2 95%   BMI 25.62 kg/m     VENTILATOR SETTINGS: Vent Mode: PRVC FiO2 (%):  [40 %] 40 % Set Rate:  [18 bmp] 18 bmp Vt Set:  [510 mL] 510 mL PEEP:  [5 cmH20] 5 cmH20 Plateau Pressure:  [19 cmH20-23 cmH20] 22 cmH20  INTAKE / OUTPUT: I/O last 3 completed shifts: In: 3896.3 [I.V.:2356.3; NG/GT:1440; IV Piggyback:100] Out: 3515 [Urine:3515]  PHYSICAL EXAMINATION: General: Frail elderly male currently on full mechanical dilatory support HEENT: Endotracheal tube connected to ventilator. PSY: Agitated or sedated Neuro: Does follow commands at time CV: Heart sounds are distant, at 70 PULM: Coarse rhonchi bilateral.  Decreased breath sounds in the bases [id number], non-tender, incisional hernias are noted.  Tube feeds currently on hold Extremities: warm/dry, 1+ edema   Skin: no rashes or lesions  LABS:  BMET Recent Labs  Lab 02/03/17 0201 02/04/17 0219 02/05/17 0422  NA 134* 138 138  K 4.0 4.7 4.8  CL 95* 101 98*  CO2 29 30 32  BUN 40* 41* 32*  CREATININE 1.30* 0.91 0.76  GLUCOSE 170* 125* 102*    Electrolytes Recent Labs  Lab 02/02/17 1620 02/03/17 0201 02/03/17 1631 02/04/17 0219 02/05/17 0422  CALCIUM  --  8.0*  --  8.0* 8.1*  MG 1.7 1.9 2.1  --   --   PHOS 1.8* 1.5* 2.8  --   --     CBC Recent Labs  Lab 02/03/17 0201 02/04/17 0620 02/05/17 0422  WBC 15.9* 15.1* 11.7*  HGB 10.4* 9.2* 9.1*  HCT 34.2* 30.3* 30.8*  PLT 126* 146* 146*   Coag's Recent Labs  Lab 02/05/2017 2334 02/01/17 1029 02/02/17 0229  APTT  --  35  --   INR 1.17 1.19 1.21   Sepsis Markers Recent Labs  Lab 02/01/17 0155  LATICACIDVEN 0.73   ABG Recent Labs  Lab 02/02/17 1212 02/02/17 1659 02/03/17 1048  PHART 7.222* 7.337* 7.332*  PCO2ART 72.1* 50.8* 56.8*  PO2ART 190.0* 63.0* 85.0   Liver Enzymes Recent Labs  Lab 02/02/17 0229 02/03/17 0201  AST 20 30  ALT 11* 12*  ALKPHOS 112 88  BILITOT 1.0 0.8  ALBUMIN 3.0* 2.6*   Cardiac Enzymes Recent Labs  Lab 02/01/17 0144 02/01/17 1029  TROPONINI <0.03 <0.03   Glucose Recent Labs  Lab 02/04/17 1208 02/04/17 1605 02/04/17 1951 02/04/17 2351 02/05/17 0354 02/05/17 0800  GLUCAP 104* 94 95 93 110* 98   Imaging Ct Chest W Contrast  Result Date: 02/04/2017 CLINICAL DATA:  83 year old male status post fall with complex right pelvic fracture. Intubated for respiratory failure. Incisional hernia from prior laparotomy. EXAM: CT CHEST, ABDOMEN, AND PELVIS WITH CONTRAST TECHNIQUE: Multidetector CT imaging of the chest, abdomen and pelvis was performed following the standard protocol during bolus administration of intravenous contrast. CONTRAST:  100 mL Omnipaque 300 COMPARISON:  CTA chest 02/01/2017. Pelvis CT 02/01/2017. CT Abdomen and Pelvis 01/25/2016. FINDINGS: CT CHEST FINDINGS  Cardiovascular: Stable cardiomegaly. Stable tortuosity of the thoracic aorta. No pericardial effusion. Mediastinum/Nodes: NG tube courses in the esophagus to the stomach. Small volume of oral contrast in the mid and distal esophagus. No lymphadenopathy. Lungs/Pleura: Endotracheal tube in place, terminates between the clavicles and carina. Major airways are patent, but bilateral lower lobe consolidation has developed since 02/01/2017 at the entire left lower lobe is consolidated. The superior segment of the right lower lobe is spared at this time. There is new patchy peribronchial opacity in the lingula and left upper lobe. The right upper lobe and middle lobe are spared at this time. New small Volume bilateral pleural effusions, mostly sub pulmonic on the right. Musculoskeletal: Osteopenia. Diffuse thoracic compression fractures except at the T1 and T4 levels. Chronic or congenital ankylosis of T11-T12. Sternum intact. Chronic left anterior first rib fracture. Un healed right seconds anterolateral rib fracture is subacute or chronic. Associated nondisplaced fractures of the right anterolateral third through eighth ribs which also appear stable and demonstrates some callus formation. Superimposed chronic right posterior third and ninth rib fractures. There is an acute nondisplaced left anterolateral third rib fracture on series 4, image 79. Superimposed subacute or chronic left fourth through sixth lateral rib fractures are stable. Chronic proximal right humerus deformity is stable. CT ABDOMEN PELVIS FINDINGS Hepatobiliary: Dilated gallbladder up to 5.5 cm diameter but no pericholecystic inflammation. Trace perihepatic fluid. Mild periportal edema. Stable liver parenchyma. Pancreas: Negative. Spleen: Trace perisplenic fluid. Adrenals/Urinary Tract: Normal adrenal glands. Bilateral renal enhancement and contrast excretion appears symmetric and within normal limits. Mild perinephric stranding but otherwise both kidneys  appear not significantly changed since 2017. The urinary bladder is decompressed by a Foley catheter, but the collapsed bladder parenchyma is layering along the right retroperitoneum in the pelvis as seen on series 3, image 94. No bladder hemorrhage suspected. Stomach/Bowel: Stable distal large bowel. Redundant sigmoid colon. Redundant and stool containing but nondilated left colon. Rectus muscle diastases with broad-based ventral abdominal hernia containing both small and large bowel loops. The superior aspect of the herniated bowel appears largely free and reducible. There is a more in capsulated appearance of the caudal aspect of the hernia containing small bowel loops as seen on sagittal image 96, with a hernia sac up to 16 cm and a hernia neck of 2-3 cm see coronal image 30. Still, no evidence of incarceration at this time. Redundant right colon with retained stool. Small volume right pericolic gutter hematoma. The cecum is on a lax mesentery. Nondilated terminal ileum. Oral contrast has not yet reached the distal small bowel. No dilated small bowel. Enteric tube in the distal stomach, tip located near a chronic gastrojejunostomy. The distal stomach and duodenum C-loop appear to be surgically absent. There is a 9-10 mm square appearing highly dense metallic object located dependently in the stomach which is new since 2017. This was seen  on 02/02/2017 radiographs. Vascular/Lymphatic: Aortoiliac calcified atherosclerosis. Ectatic infrarenal abdominal aorta up to 28 mm diameter. Major arterial structures in the abdomen and pelvis remain patent including the right iliac and proximal femoral arteries. Portal venous system appears to be patent. No lymphadenopathy. Reproductive:  Negative. Other: Right iliopsoas and right greater than left retroperitoneal hematoma has progressed since 02/15/2017. Hematoma tracks along the bilateral common iliac vessels greater on the right (series 3 image 86). Mild regional mass  effect. Mild but increased presacral stranding.  No pelvic free fluid. Musculoskeletal: Chronic lumbar spine compression fractures sparing the L5 level are stable since 2017. L1 through L3 previously augmented. Severely comminuted fracture of the right iliac wing and acetabulum with up to moderate displacement of fragments and evidence of intraosseous hematoma in the right iliac wing has not significantly changed since 02/06/2017. Associated right pubic rami fractures. Preexisting bilateral proximal femur ORIF the sacrum and SI joints appear to remain intact. No new No acute osseous abnormality identified. IMPRESSION: 1. Bilateral lower lobe pneumonia with small pleural effusions has developed since 02/01/2017, more extensive on the left. 2. Superimposed developing bronchopneumonia suspected in the left lingula and upper lobe. 3. Complex, comminuted right hemipelvis fracture with intraosseous hematoma, and a mild to moderate volume of iliopsoas and retroperitoneal hematoma which has increased since 02/01/2017. Only mild mass effect on abdominal and pelvic viscera at this time. 4. Complex ventral abdominal hernia, the cephalad extent of which appears easily reducible while the caudal extent is more pedunculated and contains small bowel. No evidence of incarceration at this time. No bowel obstruction suspected. 5. Acute nondisplaced left anterior third rib fracture. Numerous superimposed bilateral subacute and/or chronic rib fractures. Widespread non-acute spinal compression fractures. 6. Dilated gallbladder and mild periportal edema. But no strong CT evidence of acute cholecystitis. 7. ET tube and enteric tube in place. The enteric tube tip terminates at a gastrojejunostomy. 8. Metallic highly dense ingested foreign body located dependently in the stomach remnant. Perhaps this is a piece of dental amalgam. 9. Cardiomegaly. Ectatic abdominal aorta at risk for aneurysm development. Recommend followup by ultrasound in 5  years. This recommendation follows ACR consensus guidelines: White Paper of the ACR Incidental Findings Committee II on Vascular Findings. J Am Coll Radiol 2013; 10:789-794. Electronically Signed   By: Odessa Fleming M.D.   On: 02/04/2017 15:52   Ct Abdomen Pelvis W Contrast  Result Date: 02/04/2017 CLINICAL DATA:  83 year old male status post fall with complex right pelvic fracture. Intubated for respiratory failure. Incisional hernia from prior laparotomy. EXAM: CT CHEST, ABDOMEN, AND PELVIS WITH CONTRAST TECHNIQUE: Multidetector CT imaging of the chest, abdomen and pelvis was performed following the standard protocol during bolus administration of intravenous contrast. CONTRAST:  100 mL Omnipaque 300 COMPARISON:  CTA chest 02/01/2017. Pelvis CT 02/22/2017. CT Abdomen and Pelvis 01/25/2016. FINDINGS: CT CHEST FINDINGS Cardiovascular: Stable cardiomegaly. Stable tortuosity of the thoracic aorta. No pericardial effusion. Mediastinum/Nodes: NG tube courses in the esophagus to the stomach. Small volume of oral contrast in the mid and distal esophagus. No lymphadenopathy. Lungs/Pleura: Endotracheal tube in place, terminates between the clavicles and carina. Major airways are patent, but bilateral lower lobe consolidation has developed since 02/01/2017 at the entire left lower lobe is consolidated. The superior segment of the right lower lobe is spared at this time. There is new patchy peribronchial opacity in the lingula and left upper lobe. The right upper lobe and middle lobe are spared at this time. New small Volume bilateral pleural effusions, mostly sub  pulmonic on the right. Musculoskeletal: Osteopenia. Diffuse thoracic compression fractures except at the T1 and T4 levels. Chronic or congenital ankylosis of T11-T12. Sternum intact. Chronic left anterior first rib fracture. Un healed right seconds anterolateral rib fracture is subacute or chronic. Associated nondisplaced fractures of the right anterolateral third  through eighth ribs which also appear stable and demonstrates some callus formation. Superimposed chronic right posterior third and ninth rib fractures. There is an acute nondisplaced left anterolateral third rib fracture on series 4, image 79. Superimposed subacute or chronic left fourth through sixth lateral rib fractures are stable. Chronic proximal right humerus deformity is stable. CT ABDOMEN PELVIS FINDINGS Hepatobiliary: Dilated gallbladder up to 5.5 cm diameter but no pericholecystic inflammation. Trace perihepatic fluid. Mild periportal edema. Stable liver parenchyma. Pancreas: Negative. Spleen: Trace perisplenic fluid. Adrenals/Urinary Tract: Normal adrenal glands. Bilateral renal enhancement and contrast excretion appears symmetric and within normal limits. Mild perinephric stranding but otherwise both kidneys appear not significantly changed since 2017. The urinary bladder is decompressed by a Foley catheter, but the collapsed bladder parenchyma is layering along the right retroperitoneum in the pelvis as seen on series 3, image 94. No bladder hemorrhage suspected. Stomach/Bowel: Stable distal large bowel. Redundant sigmoid colon. Redundant and stool containing but nondilated left colon. Rectus muscle diastases with broad-based ventral abdominal hernia containing both small and large bowel loops. The superior aspect of the herniated bowel appears largely free and reducible. There is a more in capsulated appearance of the caudal aspect of the hernia containing small bowel loops as seen on sagittal image 96, with a hernia sac up to 16 cm and a hernia neck of 2-3 cm see coronal image 30. Still, no evidence of incarceration at this time. Redundant right colon with retained stool. Small volume right pericolic gutter hematoma. The cecum is on a lax mesentery. Nondilated terminal ileum. Oral contrast has not yet reached the distal small bowel. No dilated small bowel. Enteric tube in the distal stomach, tip  located near a chronic gastrojejunostomy. The distal stomach and duodenum C-loop appear to be surgically absent. There is a 9-10 mm square appearing highly dense metallic object located dependently in the stomach which is new since 2017. This was seen on 02/02/2017 radiographs. Vascular/Lymphatic: Aortoiliac calcified atherosclerosis. Ectatic infrarenal abdominal aorta up to 28 mm diameter. Major arterial structures in the abdomen and pelvis remain patent including the right iliac and proximal femoral arteries. Portal venous system appears to be patent. No lymphadenopathy. Reproductive:  Negative. Other: Right iliopsoas and right greater than left retroperitoneal hematoma has progressed since Feb 27, 2017. Hematoma tracks along the bilateral common iliac vessels greater on the right (series 3 image 86). Mild regional mass effect. Mild but increased presacral stranding.  No pelvic free fluid. Musculoskeletal: Chronic lumbar spine compression fractures sparing the L5 level are stable since 2017. L1 through L3 previously augmented. Severely comminuted fracture of the right iliac wing and acetabulum with up to moderate displacement of fragments and evidence of intraosseous hematoma in the right iliac wing has not significantly changed since 02/27/2017. Associated right pubic rami fractures. Preexisting bilateral proximal femur ORIF the sacrum and SI joints appear to remain intact. No new No acute osseous abnormality identified. IMPRESSION: 1. Bilateral lower lobe pneumonia with small pleural effusions has developed since 02/01/2017, more extensive on the left. 2. Superimposed developing bronchopneumonia suspected in the left lingula and upper lobe. 3. Complex, comminuted right hemipelvis fracture with intraosseous hematoma, and a mild to moderate volume of iliopsoas and retroperitoneal hematoma which  has increased since 02/01/2017. Only mild mass effect on abdominal and pelvic viscera at this time. 4. Complex ventral  abdominal hernia, the cephalad extent of which appears easily reducible while the caudal extent is more pedunculated and contains small bowel. No evidence of incarceration at this time. No bowel obstruction suspected. 5. Acute nondisplaced left anterior third rib fracture. Numerous superimposed bilateral subacute and/or chronic rib fractures. Widespread non-acute spinal compression fractures. 6. Dilated gallbladder and mild periportal edema. But no strong CT evidence of acute cholecystitis. 7. ET tube and enteric tube in place. The enteric tube tip terminates at a gastrojejunostomy. 8. Metallic highly dense ingested foreign body located dependently in the stomach remnant. Perhaps this is a piece of dental amalgam. 9. Cardiomegaly. Ectatic abdominal aorta at risk for aneurysm development. Recommend followup by ultrasound in 5 years. This recommendation follows ACR consensus guidelines: White Paper of the ACR Incidental Findings Committee II on Vascular Findings. J Am Coll Radiol 2013; 10:789-794. Electronically Signed   By: Odessa Fleming M.D.   On: 02/04/2017 15:52   Dg Chest Port 1 View  Result Date: 02/05/2017 CLINICAL DATA:  Respiratory failure, intubated patient, bilateral pneumonia. EXAM: PORTABLE CHEST 1 VIEW COMPARISON:  Portable chest x-ray of February 04, 2017 and chest CT scan of the same day. FINDINGS: The lungs are reasonably well inflated. The interstitial markings remain increased. Confluent airspace opacities are present at both bases and appears stable. There is a small left pleural effusion. The cardiac silhouette is enlarged. The central pulmonary vascularity is prominent. There is calcification in the wall of the aortic arch. The endotracheal tube tip projects approximately 3.3 cm above the carina. The esophagogastric tube tip terminates just below the GE junction with the proximal port in the distal esophagus. IMPRESSION: Bibasilar pneumonia with small left pleural effusion. Probable low-grade CHF.  Fairly stable appearance since yesterday's study. High positioning of the esophagogastric tube. Advancement by 5-10 cm is recommended. Thoracic aortic atherosclerosis. Electronically Signed   By: David  [place] M.D.   On: 02/05/2017 08:08   STUDIES:  CTA 12/2- copd, no pe  DISCUSSION: This is an 83 year old with O2 dependent COPD at baseline who is suffered from a fall with a subsequent right pelvic fracture.  We are asked to see the patient because of increasing oxygen requirement, and atrial fibrillation with rapid ventricular response.  There is no overt provocation by history exam or x-ray for an increased oxygen requirement and if his creatinine returns normal I think a CTA as you have ordered is prudent.  He is unable to wean from ventilator at this time secondary to pain from fractured pelvis and fractured rib.  He will most likely the need end-of-life discussions or tracheostomy to be liberated from mechanical ventilatory support in a more leisurely rate.  ASSESSMENT / PLAN:  PULMONARY A: Vent dependent respiratory failure in the setting of chronic obstructive pulmonary disease O2 dependent. Fractured rib. Chronic pain that requires high dose fentanyl infusion. P:   He is unable to tolerate weaning due to pain with fentanyl was decreased. Further he has a fractured rib that will impede weaning.  Further he has fractured right pelvis.  He is not a candidate for surgical intervention at this time. Due to his advanced age of 83 years of age and his fractured ribs along with his COPD he may need a tracheostomy to be liberated from the ventilator.  CARDIOVASCULAR A:  Atrial fibrillation ventricular response Paroxysmal atrial fibrillation P:  Continue negative I&O as tolerated  Appreciate cardiology input.  RENAL Lab Results  Component Value Date   CREATININE 0.76 02/05/2017   CREATININE 0.91 02/04/2017   CREATININE 1.30 (H) 02/03/2017   Recent Labs  Lab 02/03/17 0201  02/04/17 0219 02/05/17 0422  K 4.0 4.7 4.8     A:   No acute issues P:   Follow daily labs  GASTROINTESTINAL A:   GI protection Abdominal hernia from previous surgical incision P:   PPI Surgical consult performed on 02/04/2017.  No intervention required Currently n.p.o. we will restart tube feedings at trickle  HEMATOLOGIC Recent Labs    02/04/17 0620 02/05/17 0422  HGB 9.2* 9.1*    A:   DVT protection P:  PAS hose  INFECTIOUS A:   Suspected aspiration P:   Ceftriaxone started on 02/04/2017  ENDOCRINE CBG (last 3)  Recent Labs    02/04/17 2351 02/05/17 0354 02/05/17 0800  GLUCAP 93 110* 98   A:   Glucose within normal parameters P:   Follow glucose daily  NEUROLOGIC A:   Sedation for vent tolerance P:   [name] goal: -1 Daily wakeup assessment  FAMILY  - Updates:   - Inter-disciplinary family meet or Palliative Care meeting due by:  day 7   Ccm time 32 min   Attending Note:  83 year old male with hip and multiple rib fractures that continues to fail weaning due to pain.  On exam, diffuse crackles noted.  I reviewed CXR myself, ETT is in good position and pulmonary infiltrate noted.  Discussed with bedside RN and PCCM-NP.  Will hold of weaning given pain medications needs with multiple rib fractures.  I do not see this patient extubating given pain medications need.  Spoke with eldest son over the phone, they are contemplating the idea of trach/peg.  They are aware that it will take months for this to heal if at all.  We will be meeting with the entire family after 2 pm today.  The patient is critically ill with multiple organ systems failure and requires high complexity decision making for assessment and support, frequent evaluation and titration of therapies, application of advanced monitoring technologies and extensive interpretation of multiple databases.   Critical Care Time devoted to patient care services described in this note is  35   Minutes. This time reflects time of care of this signee Dr Koren BoundWesam Eulalio Reamy. This critical care time does not reflect procedure time, or teaching time or supervisory time of PA/NP/Med student/Med Resident etc but could involve care discussion time.  Alyson ReedyWesam G. Epifanio Labrador, M.D. [facility]. Pager: [phone]. After hours pager: [phone].

## 2017-02-05 NOTE — Progress Notes (Signed)
Subjective/Chief Complaint: Pt on vent    Objective: Vital signs in last 24 hours: Temp:  [97.5 F (36.4 C)-100.1 F (37.8 C)] 100.1 F (37.8 C) (12/06 0755) Pulse Rate:  [46-94] 73 (12/06 0834) Resp:  [11-24] 18 (12/06 0700) BP: (85-138)/(48-88) 116/80 (12/06 0755) SpO2:  [90 %-100 %] 95 % (12/06 0753) FiO2 (%):  [40 %] 40 % (12/06 0753) Weight:  [72 kg (158 lb 11.7 oz)] 72 kg (158 lb 11.7 oz) (12/06 0500) Last BM Date: (pta)  Intake/Output from previous day: 12/05 0701 - 12/06 0700 In: 1806.3 [I.V.:1036.3; NG/GT:720; IV Piggyback:50] Out: 2650 [Urine:2650] Intake/Output this shift: No intake/output data recorded.  GI: multiple soft hernia distended reducuble but full of air from being on ventilator   Lab Results:  Recent Labs    02/04/17 0620 02/05/17 0422  WBC 15.1* 11.7*  HGB 9.2* 9.1*  HCT 30.3* 30.8*  PLT 146* 146*   BMET Recent Labs    02/04/17 0219 02/05/17 0422  NA 138 138  K 4.7 4.8  CL 101 98*  CO2 30 32  GLUCOSE 125* 102*  BUN 41* 32*  CREATININE 0.91 0.76  CALCIUM 8.0* 8.1*   PT/INR No results for input(s): LABPROT, INR in the last 72 hours. ABG Recent Labs    02/02/17 1659 02/03/17 1048  PHART 7.337* 7.332*  HCO3 27.2 30.1*    Studies/Results: Ct Chest W Contrast  Result Date: 02/04/2017 CLINICAL DATA:  83 year old male status post fall with complex right pelvic fracture. Intubated for respiratory failure. Incisional hernia from prior laparotomy. EXAM: CT CHEST, ABDOMEN, AND PELVIS WITH CONTRAST TECHNIQUE: Multidetector CT imaging of the chest, abdomen and pelvis was performed following the standard protocol during bolus administration of intravenous contrast. CONTRAST:  100 mL Omnipaque 300 COMPARISON:  CTA chest 02/01/2017. Pelvis CT February 06, 2017. CT Abdomen and Pelvis 01/25/2016. FINDINGS: CT CHEST FINDINGS Cardiovascular: Stable cardiomegaly. Stable tortuosity of the thoracic aorta. No pericardial effusion. Mediastinum/Nodes: NG  tube courses in the esophagus to the stomach. Small volume of oral contrast in the mid and distal esophagus. No lymphadenopathy. Lungs/Pleura: Endotracheal tube in place, terminates between the clavicles and carina. Major airways are patent, but bilateral lower lobe consolidation has developed since 02/01/2017 at the entire left lower lobe is consolidated. The superior segment of the right lower lobe is spared at this time. There is new patchy peribronchial opacity in the lingula and left upper lobe. The right upper lobe and middle lobe are spared at this time. New small Volume bilateral pleural effusions, mostly sub pulmonic on the right. Musculoskeletal: Osteopenia. Diffuse thoracic compression fractures except at the T1 and T4 levels. Chronic or congenital ankylosis of T11-T12. Sternum intact. Chronic left anterior first rib fracture. Un healed right seconds anterolateral rib fracture is subacute or chronic. Associated nondisplaced fractures of the right anterolateral third through eighth ribs which also appear stable and demonstrates some callus formation. Superimposed chronic right posterior third and ninth rib fractures. There is an acute nondisplaced left anterolateral third rib fracture on series 4, image 79. Superimposed subacute or chronic left fourth through sixth lateral rib fractures are stable. Chronic proximal right humerus deformity is stable. CT ABDOMEN PELVIS FINDINGS Hepatobiliary: Dilated gallbladder up to 5.5 cm diameter but no pericholecystic inflammation. Trace perihepatic fluid. Mild periportal edema. Stable liver parenchyma. Pancreas: Negative. Spleen: Trace perisplenic fluid. Adrenals/Urinary Tract: Normal adrenal glands. Bilateral renal enhancement and contrast excretion appears symmetric and within normal limits. Mild perinephric stranding but otherwise both kidneys appear not significantly changed  since 2017. The urinary bladder is decompressed by a Foley catheter, but the collapsed  bladder parenchyma is layering along the right retroperitoneum in the pelvis as seen on series 3, image 94. No bladder hemorrhage suspected. Stomach/Bowel: Stable distal large bowel. Redundant sigmoid colon. Redundant and stool containing but nondilated left colon. Rectus muscle diastases with broad-based ventral abdominal hernia containing both small and large bowel loops. The superior aspect of the herniated bowel appears largely free and reducible. There is a more in capsulated appearance of the caudal aspect of the hernia containing small bowel loops as seen on sagittal image 96, with a hernia sac up to 16 cm and a hernia neck of 2-3 cm see coronal image 30. Still, no evidence of incarceration at this time. Redundant right colon with retained stool. Small volume right pericolic gutter hematoma. The cecum is on a lax mesentery. Nondilated terminal ileum. Oral contrast has not yet reached the distal small bowel. No dilated small bowel. Enteric tube in the distal stomach, tip located near a chronic gastrojejunostomy. The distal stomach and duodenum C-loop appear to be surgically absent. There is a 9-10 mm square appearing highly dense metallic object located dependently in the stomach which is new since 2017. This was seen on 02/02/2017 radiographs. Vascular/Lymphatic: Aortoiliac calcified atherosclerosis. Ectatic infrarenal abdominal aorta up to 28 mm diameter. Major arterial structures in the abdomen and pelvis remain patent including the right iliac and proximal femoral arteries. Portal venous system appears to be patent. No lymphadenopathy. Reproductive:  Negative. Other: Right iliopsoas and right greater than left retroperitoneal hematoma has progressed since February 09, 2017. Hematoma tracks along the bilateral common iliac vessels greater on the right (series 3 image 86). Mild regional mass effect. Mild but increased presacral stranding.  No pelvic free fluid. Musculoskeletal: Chronic lumbar spine compression  fractures sparing the L5 level are stable since 2017. L1 through L3 previously augmented. Severely comminuted fracture of the right iliac wing and acetabulum with up to moderate displacement of fragments and evidence of intraosseous hematoma in the right iliac wing has not significantly changed since 02-09-17. Associated right pubic rami fractures. Preexisting bilateral proximal femur ORIF the sacrum and SI joints appear to remain intact. No new No acute osseous abnormality identified. IMPRESSION: 1. Bilateral lower lobe pneumonia with small pleural effusions has developed since 02/01/2017, more extensive on the left. 2. Superimposed developing bronchopneumonia suspected in the left lingula and upper lobe. 3. Complex, comminuted right hemipelvis fracture with intraosseous hematoma, and a mild to moderate volume of iliopsoas and retroperitoneal hematoma which has increased since 02/01/2017. Only mild mass effect on abdominal and pelvic viscera at this time. 4. Complex ventral abdominal hernia, the cephalad extent of which appears easily reducible while the caudal extent is more pedunculated and contains small bowel. No evidence of incarceration at this time. No bowel obstruction suspected. 5. Acute nondisplaced left anterior third rib fracture. Numerous superimposed bilateral subacute and/or chronic rib fractures. Widespread non-acute spinal compression fractures. 6. Dilated gallbladder and mild periportal edema. But no strong CT evidence of acute cholecystitis. 7. ET tube and enteric tube in place. The enteric tube tip terminates at a gastrojejunostomy. 8. Metallic highly dense ingested foreign body located dependently in the stomach remnant. Perhaps this is a piece of dental amalgam. 9. Cardiomegaly. Ectatic abdominal aorta at risk for aneurysm development. Recommend followup by ultrasound in 5 years. This recommendation follows ACR consensus guidelines: White Paper of the ACR Incidental Findings Committee II on  Vascular Findings. J Am Coll Radiol 2013;  16:109-604. Electronically Signed   By: Odessa Fleming M.D.   On: 02/04/2017 15:52   Ct Abdomen Pelvis W Contrast  Result Date: 02/04/2017 CLINICAL DATA:  83 year old male status post fall with complex right pelvic fracture. Intubated for respiratory failure. Incisional hernia from prior laparotomy. EXAM: CT CHEST, ABDOMEN, AND PELVIS WITH CONTRAST TECHNIQUE: Multidetector CT imaging of the chest, abdomen and pelvis was performed following the standard protocol during bolus administration of intravenous contrast. CONTRAST:  100 mL Omnipaque 300 COMPARISON:  CTA chest 02/01/2017. Pelvis CT 02/09/2017. CT Abdomen and Pelvis 01/25/2016. FINDINGS: CT CHEST FINDINGS Cardiovascular: Stable cardiomegaly. Stable tortuosity of the thoracic aorta. No pericardial effusion. Mediastinum/Nodes: NG tube courses in the esophagus to the stomach. Small volume of oral contrast in the mid and distal esophagus. No lymphadenopathy. Lungs/Pleura: Endotracheal tube in place, terminates between the clavicles and carina. Major airways are patent, but bilateral lower lobe consolidation has developed since 02/01/2017 at the entire left lower lobe is consolidated. The superior segment of the right lower lobe is spared at this time. There is new patchy peribronchial opacity in the lingula and left upper lobe. The right upper lobe and middle lobe are spared at this time. New small Volume bilateral pleural effusions, mostly sub pulmonic on the right. Musculoskeletal: Osteopenia. Diffuse thoracic compression fractures except at the T1 and T4 levels. Chronic or congenital ankylosis of T11-T12. Sternum intact. Chronic left anterior first rib fracture. Un healed right seconds anterolateral rib fracture is subacute or chronic. Associated nondisplaced fractures of the right anterolateral third through eighth ribs which also appear stable and demonstrates some callus formation. Superimposed chronic right posterior  third and ninth rib fractures. There is an acute nondisplaced left anterolateral third rib fracture on series 4, image 79. Superimposed subacute or chronic left fourth through sixth lateral rib fractures are stable. Chronic proximal right humerus deformity is stable. CT ABDOMEN PELVIS FINDINGS Hepatobiliary: Dilated gallbladder up to 5.5 cm diameter but no pericholecystic inflammation. Trace perihepatic fluid. Mild periportal edema. Stable liver parenchyma. Pancreas: Negative. Spleen: Trace perisplenic fluid. Adrenals/Urinary Tract: Normal adrenal glands. Bilateral renal enhancement and contrast excretion appears symmetric and within normal limits. Mild perinephric stranding but otherwise both kidneys appear not significantly changed since 2017. The urinary bladder is decompressed by a Foley catheter, but the collapsed bladder parenchyma is layering along the right retroperitoneum in the pelvis as seen on series 3, image 94. No bladder hemorrhage suspected. Stomach/Bowel: Stable distal large bowel. Redundant sigmoid colon. Redundant and stool containing but nondilated left colon. Rectus muscle diastases with broad-based ventral abdominal hernia containing both small and large bowel loops. The superior aspect of the herniated bowel appears largely free and reducible. There is a more in capsulated appearance of the caudal aspect of the hernia containing small bowel loops as seen on sagittal image 96, with a hernia sac up to 16 cm and a hernia neck of 2-3 cm see coronal image 30. Still, no evidence of incarceration at this time. Redundant right colon with retained stool. Small volume right pericolic gutter hematoma. The cecum is on a lax mesentery. Nondilated terminal ileum. Oral contrast has not yet reached the distal small bowel. No dilated small bowel. Enteric tube in the distal stomach, tip located near a chronic gastrojejunostomy. The distal stomach and duodenum C-loop appear to be surgically absent. There is a  9-10 mm square appearing highly dense metallic object located dependently in the stomach which is new since 2017. This was seen on 02/02/2017 radiographs. Vascular/Lymphatic: Aortoiliac calcified  atherosclerosis. Ectatic infrarenal abdominal aorta up to 28 mm diameter. Major arterial structures in the abdomen and pelvis remain patent including the right iliac and proximal femoral arteries. Portal venous system appears to be patent. No lymphadenopathy. Reproductive:  Negative. Other: Right iliopsoas and right greater than left retroperitoneal hematoma has progressed since May 15, 2016. Hematoma tracks along the bilateral common iliac vessels greater on the right (series 3 image 86). Mild regional mass effect. Mild but increased presacral stranding.  No pelvic free fluid. Musculoskeletal: Chronic lumbar spine compression fractures sparing the L5 level are stable since 2017. L1 through L3 previously augmented. Severely comminuted fracture of the right iliac wing and acetabulum with up to moderate displacement of fragments and evidence of intraosseous hematoma in the right iliac wing has not significantly changed since May 15, 2016. Associated right pubic rami fractures. Preexisting bilateral proximal femur ORIF the sacrum and SI joints appear to remain intact. No new No acute osseous abnormality identified. IMPRESSION: 1. Bilateral lower lobe pneumonia with small pleural effusions has developed since 02/01/2017, more extensive on the left. 2. Superimposed developing bronchopneumonia suspected in the left lingula and upper lobe. 3. Complex, comminuted right hemipelvis fracture with intraosseous hematoma, and a mild to moderate volume of iliopsoas and retroperitoneal hematoma which has increased since 02/01/2017. Only mild mass effect on abdominal and pelvic viscera at this time. 4. Complex ventral abdominal hernia, the cephalad extent of which appears easily reducible while the caudal extent is more pedunculated and  contains small bowel. No evidence of incarceration at this time. No bowel obstruction suspected. 5. Acute nondisplaced left anterior third rib fracture. Numerous superimposed bilateral subacute and/or chronic rib fractures. Widespread non-acute spinal compression fractures. 6. Dilated gallbladder and mild periportal edema. But no strong CT evidence of acute cholecystitis. 7. ET tube and enteric tube in place. The enteric tube tip terminates at a gastrojejunostomy. 8. Metallic highly dense ingested foreign body located dependently in the stomach remnant. Perhaps this is a piece of dental amalgam. 9. Cardiomegaly. Ectatic abdominal aorta at risk for aneurysm development. Recommend followup by ultrasound in 5 years. This recommendation follows ACR consensus guidelines: White Paper of the ACR Incidental Findings Committee II on Vascular Findings. J Am Coll Radiol 2013; 10:789-794. Electronically Signed   By: Odessa FlemingH  Hall M.D.   On: 02/04/2017 15:52   Dg Chest Port 1 View  Result Date: 02/05/2017 CLINICAL DATA:  Respiratory failure, intubated patient, bilateral pneumonia. EXAM: PORTABLE CHEST 1 VIEW COMPARISON:  Portable chest x-ray of February 04, 2017 and chest CT scan of the same day. FINDINGS: The lungs are reasonably well inflated. The interstitial markings remain increased. Confluent airspace opacities are present at both bases and appears stable. There is a small left pleural effusion. The cardiac silhouette is enlarged. The central pulmonary vascularity is prominent. There is calcification in the wall of the aortic arch. The endotracheal tube tip projects approximately 3.3 cm above the carina. The esophagogastric tube tip terminates just below the GE junction with the proximal port in the distal esophagus. IMPRESSION: Bibasilar pneumonia with small left pleural effusion. Probable low-grade CHF. Fairly stable appearance since yesterday's study. High positioning of the esophagogastric tube. Advancement by 5-10 cm is  recommended. Thoracic aortic atherosclerosis. Electronically Signed   By: David  SwazilandJordan M.D.   On: 02/05/2017 08:08   Dg Chest Port 1 View  Result Date: 02/04/2017 CLINICAL DATA:  Check endotracheal tube placement EXAM: PORTABLE CHEST 1 VIEW COMPARISON:  02/03/2017 FINDINGS: Endotracheal tube and nasogastric catheter are noted in satisfactory position.  Cardiac shadow is enlarged but stable. Aortic calcifications are again seen. Small bilateral pleural effusions are noted left greater than right with patchy infiltrate in the bases bilaterally. The overall appearance is stable from the prior exam. Changes of prior vertebral augmentation are noted. IMPRESSION: Bibasilar changes as described stable from the prior exam. Electronically Signed   By: Alcide CleverMark  Lukens M.D.   On: 02/04/2017 08:59    Anti-infectives: Anti-infectives (From admission, onward)   Start     Dose/Rate Route Frequency Ordered Stop   02/04/17 1000  cefTRIAXone (ROCEPHIN) 1 g in dextrose 5 % 50 mL IVPB     1 g 100 mL/hr over 30 Minutes Intravenous Every 24 hours 02/04/17 0908     02/02/17 1200  cefTAZidime (FORTAZ) 1 g in dextrose 5 % 50 mL IVPB  Status:  Discontinued     1 g 100 mL/hr over 30 Minutes Intravenous Every 12 hours 02/02/17 1129 02/04/17 0908      Assessment/Plan: Incisional hernia Reviewed CT and compared to old CT Hernia date back to 2017 and were present  Due to being on a ventilator and having probable element of ileus these are distended but not obstructed nor does he show signs of incarceration or strangulation at this point No intervention required at this point but will follow along    LOS: 4 days    Maisie Fushomas A Tanica Gaige 02/05/2017

## 2017-02-05 NOTE — Progress Notes (Addendum)
Nutrition Follow-up  DOCUMENTATION CODES:   Non-severe (moderate) malnutrition in context of chronic illness  INTERVENTION:   Continue:  Vital AF 1.2 @ 60 ml/hr Provides 1728 kcals, 108 g of protein and 1166 mL of free water  NUTRITION DIAGNOSIS:   Moderate Malnutrition related to chronic illness(COPD) as evidenced by mild fat depletion, mild muscle depletion. Ongoing.   GOAL:   Patient will meet greater than or equal to 90% of their needs Progressing.   MONITOR:   TF tolerance, Vent status, Labs, Weight trends  REASON FOR ASSESSMENT:   Consult Hip fracture protocol, Enteral/tube feeding initiation and management  ASSESSMENT:   83 yo male admitted after fall at home with pelvic and hip fracture with retroperitoneal hemorrhage, acute on chronic respiratory failure. Pt with hx of COPD with chronic respiratory failure, abdominal hernias, hx of partial gastrectomy, perforated duodenal ulcer  Spoke with RN. TF was off for test and possible procedure. TF resumed today. Per MD pt is unable to wean from vent due to pain and may need trach/PEG.  No bm yet this admission, difficult to determine if abd distended due to hernia.   Patient is currently intubated on ventilator support MV: 9.4 L/min Temp (24hrs), Avg:98.7 F (37.1 C), Min:97.5 F (36.4 C), Max:100.1 F (37.8 C)  Medications reviewed and include: vitamin D, solumedrol, miralax, KCl, senokot Labs reviewed    Diet Order:  Diet NPO time specified  EDUCATION NEEDS:   Not appropriate for education at this time  Skin:  Skin Assessment: Reviewed RN Assessment  Last BM:  no documented BM  Height:   Ht Readings from Last 1 Encounters:  02/02/17 5\' 6"  (1.676 m)    Weight:   Wt Readings from Last 1 Encounters:  02/05/17 158 lb 11.7 oz (72 kg)    Ideal Body Weight:     BMI:  Body mass index is 25.62 kg/m.  Estimated Nutritional Needs:   Kcal:  1757 kcals  Protein:  105-140 g   Fluid:  >/= 1.7  L  Kendell BaneHeather Jaslynn Thome RD, LDN, CNSC [phone] Pager [phone] After Hours Pager

## 2017-02-05 NOTE — Progress Notes (Signed)
Pt was being pulled up in the bed when he raised his fist and began to get agitated.  At the same time his blood pressure had been increasing for the last 2 hours.  Pts pain medication was increased and pt was given medication to relax him.  Family at bedside and educated on what we are doing with pt to treat him at this time. Will continue to monitor.

## 2017-02-06 ENCOUNTER — Inpatient Hospital Stay (HOSPITAL_COMMUNITY): Payer: Medicare Other

## 2017-02-06 DIAGNOSIS — S32443A Displaced fracture of posterior column [ilioischial] of unspecified acetabulum, initial encounter for closed fracture: Secondary | ICD-10-CM

## 2017-02-06 DIAGNOSIS — Z7189 Other specified counseling: Secondary | ICD-10-CM

## 2017-02-06 DIAGNOSIS — Z515 Encounter for palliative care: Secondary | ICD-10-CM

## 2017-02-06 DIAGNOSIS — S32433A Displaced fracture of anterior column [iliopubic] of unspecified acetabulum, initial encounter for closed fracture: Secondary | ICD-10-CM

## 2017-02-06 LAB — BASIC METABOLIC PANEL WITH GFR
Anion gap: 8 (ref 5–15)
BUN: 28 mg/dL — ABNORMAL HIGH (ref 6–20)
CO2: 29 mmol/L (ref 22–32)
Calcium: 8 mg/dL — ABNORMAL LOW (ref 8.9–10.3)
Chloride: 99 mmol/L — ABNORMAL LOW (ref 101–111)
Creatinine, Ser: 0.78 mg/dL (ref 0.61–1.24)
GFR calc Af Amer: >60 mL/min
GFR calc non Af Amer: >60 mL/min
Glucose, Bld: 141 mg/dL — ABNORMAL HIGH (ref 65–99)
Potassium: 4.7 mmol/L (ref 3.5–5.1)
Sodium: 136 mmol/L (ref 135–145)

## 2017-02-06 LAB — CBC WITH DIFFERENTIAL/PLATELET
BASOS ABS: 0 10*3/uL (ref 0.0–0.1)
Basophils Relative: 0 %
Eosinophils Absolute: 0 10*3/uL (ref 0.0–0.7)
Eosinophils Relative: 0 %
HEMATOCRIT: 35.6 % — AB (ref 39.0–52.0)
Hemoglobin: 10.7 g/dL — ABNORMAL LOW (ref 13.0–17.0)
LYMPHS PCT: 7 %
Lymphs Abs: 0.8 10*3/uL (ref 0.7–4.0)
MCH: 26.2 pg (ref 26.0–34.0)
MCHC: 30.1 g/dL (ref 30.0–36.0)
MCV: 87 fL (ref 78.0–100.0)
MONO ABS: 0.2 10*3/uL (ref 0.1–1.0)
Monocytes Relative: 1 %
NEUTROS ABS: 11.2 10*3/uL — AB (ref 1.7–7.7)
Neutrophils Relative %: 92 %
PLATELETS: 181 10*3/uL (ref 150–400)
RBC: 4.09 MIL/uL — AB (ref 4.22–5.81)
RDW: 15.6 % — AB (ref 11.5–15.5)
WBC: 12.1 10*3/uL — AB (ref 4.0–10.5)

## 2017-02-06 LAB — GLUCOSE, CAPILLARY
GLUCOSE-CAPILLARY: 119 mg/dL — AB (ref 65–99)
GLUCOSE-CAPILLARY: 154 mg/dL — AB (ref 65–99)
Glucose-Capillary: 141 mg/dL — ABNORMAL HIGH (ref 65–99)
Glucose-Capillary: 126 mg/dL — ABNORMAL HIGH (ref 65–99)
Glucose-Capillary: 123 mg/dL — ABNORMAL HIGH (ref 65–99)
Glucose-Capillary: 106 mg/dL — ABNORMAL HIGH (ref 65–99)

## 2017-02-06 LAB — PHOSPHORUS: PHOSPHORUS: 3 mg/dL (ref 2.5–4.6)

## 2017-02-06 LAB — BLOOD GAS, ARTERIAL
ACID-BASE EXCESS: 7.2 mmol/L — AB (ref 0.0–2.0)
BICARBONATE: 31.9 mmol/L — AB (ref 20.0–28.0)
Drawn by: 511331
FIO2: 40
LHR: 18 {breaths}/min
O2 Saturation: 94.6 %
PATIENT TEMPERATURE: 98.6
PEEP/CPAP: 5 cmH2O
VT: 510 mL
pCO2 arterial: 51.8 mmHg — ABNORMAL HIGH (ref 32.0–48.0)
pH, Arterial: 7.406 (ref 7.350–7.450)
pO2, Arterial: 76.7 mmHg — ABNORMAL LOW (ref 83.0–108.0)

## 2017-02-06 LAB — MAGNESIUM: Magnesium: 2.2 mg/dL (ref 1.7–2.4)

## 2017-02-06 MED ORDER — PREDNISONE 20 MG PO TABS
20.0000 mg | ORAL_TABLET | Freq: Every day | ORAL | Status: AC
  Administered 2017-02-08: 20 mg via ORAL
  Filled 2017-02-06 (×2): qty 1

## 2017-02-06 MED ORDER — PREDNISONE 10 MG PO TABS
10.0000 mg | ORAL_TABLET | Freq: Every day | ORAL | Status: AC
  Administered 2017-02-09: 10 mg via ORAL
  Filled 2017-02-06: qty 1

## 2017-02-06 MED ORDER — GABAPENTIN 250 MG/5ML PO SOLN
300.0000 mg | Freq: Three times a day (TID) | ORAL | Status: DC
  Administered 2017-02-06 – 2017-02-11 (×16): 300 mg via ORAL
  Filled 2017-02-06 (×23): qty 6

## 2017-02-06 MED ORDER — PREDNISONE 20 MG PO TABS
30.0000 mg | ORAL_TABLET | Freq: Every day | ORAL | Status: AC
  Administered 2017-02-07: 10:00:00 30 mg via ORAL
  Filled 2017-02-06: qty 1

## 2017-02-06 NOTE — Progress Notes (Signed)
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  Since admission he has developed increasing oxygen requirements and is developed atrial fibrillation with rapid ventricular response.  An amiodarone infusion was initiated this morning and he is already responded with a heart rate in the 80s during my examination.  He denies any increase in his usual cough or any subjective increase in dyspnea.  He is not having any sort of chest pain including including pleuritic chest pain.   SUBJECTIVE:  No events overnight, no new complaints  VITAL SIGNS: BP (!) 102/58 (BP Location: Right Arm)   Pulse (!) 55   Temp 97.8 F (36.6 C) (Axillary)   Resp (!) 9   Ht 5\' 6"  (1.676 m)   Wt 72.2 kg (159 lb 2.8 oz)   SpO2 97%   BMI 25.69 kg/m   VENTILATOR SETTINGS: Vent Mode: PSV;CPAP FiO2 (%):  [40 %] 40 % Set Rate:  [18 bmp] 18 bmp Vt Set:  [510 mL] 510 mL PEEP:  [5 cmH20] 5 cmH20 Pressure Support:  [20 cmH20] 20 cmH20 Plateau Pressure:  [19 cmH20-22 cmH20] 22 cmH20  INTAKE / OUTPUT: I/O last 3 completed shifts: In: 1466.5 [I.V.:722.5; NG/GT:694; IV Piggyback:50] Out: 3085 [Urine:3085]  PHYSICAL EXAMINATION: General: Frail elderly male, NAD, sedate HEENT: [place]/AT, PERRL, EOM-I and MMM PSY: Sedated, NAD Neuro: Sedate, moving all ext to pain CV: RRR, Nl S1/S2, -M/R/G. PULM: CTA bilaterally GI: Soft, NT, ND and +BS Extremities: warm/dry, 1+ edema  Skin: no rashes or lesions  LABS:  BMET Recent Labs  Lab 02/04/17 0219 02/05/17 0422 02/06/17 0236  NA 138 138 136  K 4.7 4.8 4.7  CL  101 98* 99*  CO2 30 32 29  BUN 41* 32* 28*  CREATININE 0.91 0.76 0.78  GLUCOSE 125* 102* 141*   Electrolytes Recent Labs  Lab 02/03/17 0201 02/03/17 1631 02/04/17 0219 02/05/17 0422 02/06/17 0236  CALCIUM 8.0*  --  8.0* 8.1* 8.0*  MG 1.9 2.1  --   --  2.2  PHOS 1.5* 2.8  --   --  3.0   CBC Recent Labs  Lab 02/04/17 0620 02/05/17 0422 02/06/17 0236  WBC 15.1* 11.7* 12.1*  HGB 9.2* 9.1* 10.7*  HCT 30.3* 30.8* 35.6*  PLT 146* 146* 181   Coag's Recent Labs  Lab 02/02/2017 2334 02/01/17 1029 02/02/17 0229  APTT  --  35  --   INR 1.17 1.19 1.21   Sepsis Markers Recent Labs  Lab 02/01/17 0155  LATICACIDVEN 0.73   ABG Recent Labs  Lab 02/02/17 1659 02/03/17 1048 02/06/17 0455  PHART 7.337* 7.332* 7.406  PCO2ART 50.8* 56.8* 51.8*  PO2ART 63.0* 85.0 76.7*   Liver Enzymes Recent Labs  Lab 02/02/17 0229 02/03/17 0201  AST 20 30  ALT 11* 12*  ALKPHOS 112 88  BILITOT 1.0 0.8  ALBUMIN 3.0* 2.6*   Cardiac Enzymes Recent Labs  Lab 02/01/17 0144 02/01/17 1029  TROPONINI <0.03 <0.03   Glucose Recent Labs  Lab 02/05/17 0800 02/05/17 1206 02/05/17 1538 02/05/17 2339 02/06/17 0422 02/06/17 0750  GLUCAP 98 102* 104* 142* 141* 154*  Imaging Dg Chest Port 1 View  Result Date: 02/06/2017 CLINICAL DATA:  Check endotracheal tube placement EXAM: PORTABLE CHEST 1 VIEW COMPARISON:  02/05/2017 FINDINGS: Cardiac shadow is again enlarged. Endotracheal tube and nasogastric catheter are noted in satisfactory position. Bilateral pleural effusions are again noted with bibasilar infiltrates. Some mild vascular congestion and edema is noted as well. IMPRESSION: Overall stable appearance of the chest. Electronically Signed   By: Alcide CleverMark  Lukens M.D.   On: 02/06/2017 08:29   STUDIES:  CTA 12/2- copd, no pe  DISCUSSION: This is an 83 year old with O2 dependent COPD at baseline who is suffered from a fall with a subsequent right pelvic fracture.  We are asked to see the  patient because of increasing oxygen requirement, and atrial fibrillation with rapid ventricular response.  There is no overt provocation by history exam or x-ray for an increased oxygen requirement and if his creatinine returns normal I think a CTA as you have ordered is prudent.  He is unable to wean from ventilator at this time secondary to pain from fractured pelvis and fractured rib.  He will most likely the need end-of-life discussions or tracheostomy to be liberated from mechanical ventilatory support in a more leisurely rate.  ASSESSMENT / PLAN:  PULMONARY A: Vent dependent respiratory failure in the setting of chronic obstructive pulmonary disease O2 dependent. Fractured rib. Chronic pain that requires high dose fentanyl infusion. P:   No trach/peg per family Will terminally extubate when family is ready, likely next week Discussing DNR  CARDIOVASCULAR A:  Atrial fibrillation ventricular response Paroxysmal atrial fibrillation P:  Hold further lasix Tele monitoring  RENAL Lab Results  Component Value Date   CREATININE 0.78 02/06/2017   CREATININE 0.76 02/05/2017   CREATININE 0.91 02/04/2017   Recent Labs  Lab 02/04/17 0219 02/05/17 0422 02/06/17 0236  K 4.7 4.8 4.7   A:   No acute issues P:   BMET in AM Replace electrolytes as indicated KVO IVF  GASTROINTESTINAL A:   GI protection Abdominal hernia from previous surgical incision P:   PPI Surgical consult performed on 02/04/2017.  No intervention required TF per nutrition  HEMATOLOGIC Recent Labs    02/05/17 0422 02/06/17 0236  HGB 9.1* 10.7*   A:   DVT protection P:  PAS hose  INFECTIOUS A:   Suspected aspiration P:   Ceftriaxone started on 02/04/2017  ENDOCRINE CBG (last 3)  Recent Labs    02/05/17 2339 02/06/17 0422 02/06/17 0750  GLUCAP 142* 141* 154*   A:   Glucose within normal parameters P:   Follow glucose daily  NEUROLOGIC A:   Sedation for vent tolerance P:    [name] goal: -1 D/C wake up assessment  Fentanyl for comfort  FAMILY  - Updates: Spoke with family, no trach/peg, deciding on withdrawal when family arrives but given snow this weekend, may need to be next week.  - Inter-disciplinary family meet or Palliative Care meeting due by:  day 7  The patient is critically ill with multiple organ systems failure and requires high complexity decision making for assessment and support, frequent evaluation and titration of therapies, application of advanced monitoring technologies and extensive interpretation of multiple databases.   Critical Care Time devoted to patient care services described in this note is  35  Minutes. This time reflects time of care of this signee Dr Koren BoundWesam Muhammed Teutsch. This critical care time does not reflect procedure time, or teaching time or supervisory time of PA/NP/Med student/Med Resident etc but could  involve care discussion time.  Rush Farmer, M.D. [facility] Pulmonary/Critical Care Medicine. Pager: [phone]. After hours pager: [phone].

## 2017-02-06 NOTE — Progress Notes (Signed)
Subjective/Chief Complaint: On vent    Objective: Vital signs in last 24 hours: Temp:  [97.8 F (36.6 C)-99.4 F (37.4 C)] 97.8 F (36.6 C) (12/07 0747) Pulse Rate:  [50-84] 55 (12/07 0800) Resp:  [9-20] 9 (12/07 0800) BP: (83-183)/(58-162) 102/58 (12/07 0800) SpO2:  [92 %-100 %] 97 % (12/07 0800) FiO2 (%):  [40 %] 40 % (12/07 0800) Weight:  [72.2 kg (159 lb 2.8 oz)] 72.2 kg (159 lb 2.8 oz) (12/07 0425) Last BM Date: (pta)  Intake/Output from previous day: 12/06 0701 - 12/07 0700 In: 1024 [I.V.:280; NG/GT:694; IV Piggyback:50] Out: 2310 [Urine:2310] Intake/Output this shift: Total I/O In: 103.6 [I.V.:103.6] Out: 75 [Urine:75]  GI: hernia softer less distended reducible no redness   Lab Results:  Recent Labs    02/05/17 0422 02/06/17 0236  WBC 11.7* 12.1*  HGB 9.1* 10.7*  HCT 30.8* 35.6*  PLT 146* 181   BMET Recent Labs    02/05/17 0422 02/06/17 0236  NA 138 136  K 4.8 4.7  CL 98* 99*  CO2 32 29  GLUCOSE 102* 141*  BUN 32* 28*  CREATININE 0.76 0.78  CALCIUM 8.1* 8.0*   PT/INR No results for input(s): LABPROT, INR in the last 72 hours. ABG Recent Labs    02/03/17 1048 02/06/17 0455  PHART 7.332* 7.406  HCO3 30.1* 31.9*    Studies/Results: Ct Chest W Contrast  Result Date: 02/04/2017 CLINICAL DATA:  83 year old male status post fall with complex right pelvic fracture. Intubated for respiratory failure. Incisional hernia from prior laparotomy. EXAM: CT CHEST, ABDOMEN, AND PELVIS WITH CONTRAST TECHNIQUE: Multidetector CT imaging of the chest, abdomen and pelvis was performed following the standard protocol during bolus administration of intravenous contrast. CONTRAST:  100 mL Omnipaque 300 COMPARISON:  CTA chest 02/01/2017. Pelvis CT 02-27-17. CT Abdomen and Pelvis 01/25/2016. FINDINGS: CT CHEST FINDINGS Cardiovascular: Stable cardiomegaly. Stable tortuosity of the thoracic aorta. No pericardial effusion. Mediastinum/Nodes: NG tube courses in the  esophagus to the stomach. Small volume of oral contrast in the mid and distal esophagus. No lymphadenopathy. Lungs/Pleura: Endotracheal tube in place, terminates between the clavicles and carina. Major airways are patent, but bilateral lower lobe consolidation has developed since 02/01/2017 at the entire left lower lobe is consolidated. The superior segment of the right lower lobe is spared at this time. There is new patchy peribronchial opacity in the lingula and left upper lobe. The right upper lobe and middle lobe are spared at this time. New small Volume bilateral pleural effusions, mostly sub pulmonic on the right. Musculoskeletal: Osteopenia. Diffuse thoracic compression fractures except at the T1 and T4 levels. Chronic or congenital ankylosis of T11-T12. Sternum intact. Chronic left anterior first rib fracture. Un healed right seconds anterolateral rib fracture is subacute or chronic. Associated nondisplaced fractures of the right anterolateral third through eighth ribs which also appear stable and demonstrates some callus formation. Superimposed chronic right posterior third and ninth rib fractures. There is an acute nondisplaced left anterolateral third rib fracture on series 4, image 79. Superimposed subacute or chronic left fourth through sixth lateral rib fractures are stable. Chronic proximal right humerus deformity is stable. CT ABDOMEN PELVIS FINDINGS Hepatobiliary: Dilated gallbladder up to 5.5 cm diameter but no pericholecystic inflammation. Trace perihepatic fluid. Mild periportal edema. Stable liver parenchyma. Pancreas: Negative. Spleen: Trace perisplenic fluid. Adrenals/Urinary Tract: Normal adrenal glands. Bilateral renal enhancement and contrast excretion appears symmetric and within normal limits. Mild perinephric stranding but otherwise both kidneys appear not significantly changed since 2017. The  urinary bladder is decompressed by a Foley catheter, but the collapsed bladder parenchyma is  layering along the right retroperitoneum in the pelvis as seen on series 3, image 94. No bladder hemorrhage suspected. Stomach/Bowel: Stable distal large bowel. Redundant sigmoid colon. Redundant and stool containing but nondilated left colon. Rectus muscle diastases with broad-based ventral abdominal hernia containing both small and large bowel loops. The superior aspect of the herniated bowel appears largely free and reducible. There is a more in capsulated appearance of the caudal aspect of the hernia containing small bowel loops as seen on sagittal image 96, with a hernia sac up to 16 cm and a hernia neck of 2-3 cm see coronal image 30. Still, no evidence of incarceration at this time. Redundant right colon with retained stool. Small volume right pericolic gutter hematoma. The cecum is on a lax mesentery. Nondilated terminal ileum. Oral contrast has not yet reached the distal small bowel. No dilated small bowel. Enteric tube in the distal stomach, tip located near a chronic gastrojejunostomy. The distal stomach and duodenum C-loop appear to be surgically absent. There is a 9-10 mm square appearing highly dense metallic object located dependently in the stomach which is new since 2017. This was seen on 02/02/2017 radiographs. Vascular/Lymphatic: Aortoiliac calcified atherosclerosis. Ectatic infrarenal abdominal aorta up to 28 mm diameter. Major arterial structures in the abdomen and pelvis remain patent including the right iliac and proximal femoral arteries. Portal venous system appears to be patent. No lymphadenopathy. Reproductive:  Negative. Other: Right iliopsoas and right greater than left retroperitoneal hematoma has progressed since 11-03-16. Hematoma tracks along the bilateral common iliac vessels greater on the right (series 3 image 86). Mild regional mass effect. Mild but increased presacral stranding.  No pelvic free fluid. Musculoskeletal: Chronic lumbar spine compression fractures sparing the L5  level are stable since 2017. L1 through L3 previously augmented. Severely comminuted fracture of the right iliac wing and acetabulum with up to moderate displacement of fragments and evidence of intraosseous hematoma in the right iliac wing has not significantly changed since 11-03-16. Associated right pubic rami fractures. Preexisting bilateral proximal femur ORIF the sacrum and SI joints appear to remain intact. No new No acute osseous abnormality identified. IMPRESSION: 1. Bilateral lower lobe pneumonia with small pleural effusions has developed since 02/01/2017, more extensive on the left. 2. Superimposed developing bronchopneumonia suspected in the left lingula and upper lobe. 3. Complex, comminuted right hemipelvis fracture with intraosseous hematoma, and a mild to moderate volume of iliopsoas and retroperitoneal hematoma which has increased since 02/01/2017. Only mild mass effect on abdominal and pelvic viscera at this time. 4. Complex ventral abdominal hernia, the cephalad extent of which appears easily reducible while the caudal extent is more pedunculated and contains small bowel. No evidence of incarceration at this time. No bowel obstruction suspected. 5. Acute nondisplaced left anterior third rib fracture. Numerous superimposed bilateral subacute and/or chronic rib fractures. Widespread non-acute spinal compression fractures. 6. Dilated gallbladder and mild periportal edema. But no strong CT evidence of acute cholecystitis. 7. ET tube and enteric tube in place. The enteric tube tip terminates at a gastrojejunostomy. 8. Metallic highly dense ingested foreign body located dependently in the stomach remnant. Perhaps this is a piece of dental amalgam. 9. Cardiomegaly. Ectatic abdominal aorta at risk for aneurysm development. Recommend followup by ultrasound in 5 years. This recommendation follows ACR consensus guidelines: White Paper of the ACR Incidental Findings Committee II on Vascular Findings. J Am  Coll Radiol 2013; 10:789-794. Electronically Signed  By: Odessa FlemingH  Hall M.D.   On: 02/04/2017 15:52   Ct Abdomen Pelvis W Contrast  Result Date: 02/04/2017 CLINICAL DATA:  83 year old male status post fall with complex right pelvic fracture. Intubated for respiratory failure. Incisional hernia from prior laparotomy. EXAM: CT CHEST, ABDOMEN, AND PELVIS WITH CONTRAST TECHNIQUE: Multidetector CT imaging of the chest, abdomen and pelvis was performed following the standard protocol during bolus administration of intravenous contrast. CONTRAST:  100 mL Omnipaque 300 COMPARISON:  CTA chest 02/01/2017. Pelvis CT 02/22/2017. CT Abdomen and Pelvis 01/25/2016. FINDINGS: CT CHEST FINDINGS Cardiovascular: Stable cardiomegaly. Stable tortuosity of the thoracic aorta. No pericardial effusion. Mediastinum/Nodes: NG tube courses in the esophagus to the stomach. Small volume of oral contrast in the mid and distal esophagus. No lymphadenopathy. Lungs/Pleura: Endotracheal tube in place, terminates between the clavicles and carina. Major airways are patent, but bilateral lower lobe consolidation has developed since 02/01/2017 at the entire left lower lobe is consolidated. The superior segment of the right lower lobe is spared at this time. There is new patchy peribronchial opacity in the lingula and left upper lobe. The right upper lobe and middle lobe are spared at this time. New small Volume bilateral pleural effusions, mostly sub pulmonic on the right. Musculoskeletal: Osteopenia. Diffuse thoracic compression fractures except at the T1 and T4 levels. Chronic or congenital ankylosis of T11-T12. Sternum intact. Chronic left anterior first rib fracture. Un healed right seconds anterolateral rib fracture is subacute or chronic. Associated nondisplaced fractures of the right anterolateral third through eighth ribs which also appear stable and demonstrates some callus formation. Superimposed chronic right posterior third and ninth rib  fractures. There is an acute nondisplaced left anterolateral third rib fracture on series 4, image 79. Superimposed subacute or chronic left fourth through sixth lateral rib fractures are stable. Chronic proximal right humerus deformity is stable. CT ABDOMEN PELVIS FINDINGS Hepatobiliary: Dilated gallbladder up to 5.5 cm diameter but no pericholecystic inflammation. Trace perihepatic fluid. Mild periportal edema. Stable liver parenchyma. Pancreas: Negative. Spleen: Trace perisplenic fluid. Adrenals/Urinary Tract: Normal adrenal glands. Bilateral renal enhancement and contrast excretion appears symmetric and within normal limits. Mild perinephric stranding but otherwise both kidneys appear not significantly changed since 2017. The urinary bladder is decompressed by a Foley catheter, but the collapsed bladder parenchyma is layering along the right retroperitoneum in the pelvis as seen on series 3, image 94. No bladder hemorrhage suspected. Stomach/Bowel: Stable distal large bowel. Redundant sigmoid colon. Redundant and stool containing but nondilated left colon. Rectus muscle diastases with broad-based ventral abdominal hernia containing both small and large bowel loops. The superior aspect of the herniated bowel appears largely free and reducible. There is a more in capsulated appearance of the caudal aspect of the hernia containing small bowel loops as seen on sagittal image 96, with a hernia sac up to 16 cm and a hernia neck of 2-3 cm see coronal image 30. Still, no evidence of incarceration at this time. Redundant right colon with retained stool. Small volume right pericolic gutter hematoma. The cecum is on a lax mesentery. Nondilated terminal ileum. Oral contrast has not yet reached the distal small bowel. No dilated small bowel. Enteric tube in the distal stomach, tip located near a chronic gastrojejunostomy. The distal stomach and duodenum C-loop appear to be surgically absent. There is a 9-10 mm square  appearing highly dense metallic object located dependently in the stomach which is new since 2017. This was seen on 02/02/2017 radiographs. Vascular/Lymphatic: Aortoiliac calcified atherosclerosis. Ectatic infrarenal abdominal aorta  up to 28 mm diameter. Major arterial structures in the abdomen and pelvis remain patent including the right iliac and proximal femoral arteries. Portal venous system appears to be patent. No lymphadenopathy. Reproductive:  Negative. Other: Right iliopsoas and right greater than left retroperitoneal hematoma has progressed since 02-14-17. Hematoma tracks along the bilateral common iliac vessels greater on the right (series 3 image 86). Mild regional mass effect. Mild but increased presacral stranding.  No pelvic free fluid. Musculoskeletal: Chronic lumbar spine compression fractures sparing the L5 level are stable since 2017. L1 through L3 previously augmented. Severely comminuted fracture of the right iliac wing and acetabulum with up to moderate displacement of fragments and evidence of intraosseous hematoma in the right iliac wing has not significantly changed since 02/14/17. Associated right pubic rami fractures. Preexisting bilateral proximal femur ORIF the sacrum and SI joints appear to remain intact. No new No acute osseous abnormality identified. IMPRESSION: 1. Bilateral lower lobe pneumonia with small pleural effusions has developed since 02/01/2017, more extensive on the left. 2. Superimposed developing bronchopneumonia suspected in the left lingula and upper lobe. 3. Complex, comminuted right hemipelvis fracture with intraosseous hematoma, and a mild to moderate volume of iliopsoas and retroperitoneal hematoma which has increased since 02/01/2017. Only mild mass effect on abdominal and pelvic viscera at this time. 4. Complex ventral abdominal hernia, the cephalad extent of which appears easily reducible while the caudal extent is more pedunculated and contains small bowel.  No evidence of incarceration at this time. No bowel obstruction suspected. 5. Acute nondisplaced left anterior third rib fracture. Numerous superimposed bilateral subacute and/or chronic rib fractures. Widespread non-acute spinal compression fractures. 6. Dilated gallbladder and mild periportal edema. But no strong CT evidence of acute cholecystitis. 7. ET tube and enteric tube in place. The enteric tube tip terminates at a gastrojejunostomy. 8. Metallic highly dense ingested foreign body located dependently in the stomach remnant. Perhaps this is a piece of dental amalgam. 9. Cardiomegaly. Ectatic abdominal aorta at risk for aneurysm development. Recommend followup by ultrasound in 5 years. This recommendation follows ACR consensus guidelines: White Paper of the ACR Incidental Findings Committee II on Vascular Findings. J Am Coll Radiol 2013; 10:789-794. Electronically Signed   By: Odessa Fleming M.D.   On: 02/04/2017 15:52   Dg Chest Port 1 View  Result Date: 02/06/2017 CLINICAL DATA:  Check endotracheal tube placement EXAM: PORTABLE CHEST 1 VIEW COMPARISON:  02/05/2017 FINDINGS: Cardiac shadow is again enlarged. Endotracheal tube and nasogastric catheter are noted in satisfactory position. Bilateral pleural effusions are again noted with bibasilar infiltrates. Some mild vascular congestion and edema is noted as well. IMPRESSION: Overall stable appearance of the chest. Electronically Signed   By: Alcide Clever M.D.   On: 02/06/2017 08:29   Dg Chest Port 1 View  Result Date: 02/05/2017 CLINICAL DATA:  Respiratory failure, intubated patient, bilateral pneumonia. EXAM: PORTABLE CHEST 1 VIEW COMPARISON:  Portable chest x-ray of February 04, 2017 and chest CT scan of the same day. FINDINGS: The lungs are reasonably well inflated. The interstitial markings remain increased. Confluent airspace opacities are present at both bases and appears stable. There is a small left pleural effusion. The cardiac silhouette is  enlarged. The central pulmonary vascularity is prominent. There is calcification in the wall of the aortic arch. The endotracheal tube tip projects approximately 3.3 cm above the carina. The esophagogastric tube tip terminates just below the GE junction with the proximal port in the distal esophagus. IMPRESSION: Bibasilar pneumonia with small left  pleural effusion. Probable low-grade CHF. Fairly stable appearance since yesterday's study. High positioning of the esophagogastric tube. Advancement by 5-10 cm is recommended. Thoracic aortic atherosclerosis. Electronically Signed   By: David  [place] M.D.   On: 02/05/2017 08:08    Anti-infectives: Anti-infectives (From admission, onward)   Start     Dose/Rate Route Frequency Ordered Stop   02/04/17 1000  cefTRIAXone (ROCEPHIN) 1 g in dextrose 5 % 50 mL IVPB     1 g 100 mL/hr over 30 Minutes Intravenous Every 24 hours 02/04/17 0908     02/02/17 1200  cefTAZidime (FORTAZ) 1 g in dextrose 5 % 50 mL IVPB  Status:  Discontinued     1 g 100 mL/hr over 30 Minutes Intravenous Every 12 hours 02/02/17 1129 02/04/17 0908      Assessment/Plan: Incisional hernia Stable  Looks better today and softer  No acute surgical intervention at this point  Not incarcerated or obstructed  Will follow     LOS: 5 days    Clovis Pu Caeley Dohrmann 02/06/2017

## 2017-02-06 NOTE — Progress Notes (Signed)
Pt remains intubated at this time. CSW will assess pt once pt is more medically stable. CSW will continue to follow for further needs at this time.     Claude MangesKierra S. Editha Bridgeforth, MSW, LCSW-A Emergency Department Clinical Social Worker [phone]

## 2017-02-06 NOTE — Progress Notes (Signed)
Echo personally reviewed - normal LVEF. Creatinine stable and volume status even. Off amiodarone. Not an anticoagulation candidate. No further suggestions at this time. If a-fib recurs, would use amiodarone. Can follow-up with me after discharge.  Will sign-off. Call with questions.  Chrystie NoseKenneth C. Hilty, MD, [facility], LLCFACC, FACP  [place]  [facility]  Medical Director of the [facility]  [facility] Attending Cardiologist  Direct Dial: [phone]  Fax: [phone]  Website:  [email or web address]

## 2017-02-06 NOTE — Progress Notes (Signed)
Pt placed back on full vent support due to decreased RR. 

## 2017-02-07 LAB — GLUCOSE, CAPILLARY
GLUCOSE-CAPILLARY: 102 mg/dL — AB (ref 65–99)
GLUCOSE-CAPILLARY: 105 mg/dL — AB (ref 65–99)
GLUCOSE-CAPILLARY: 120 mg/dL — AB (ref 65–99)
GLUCOSE-CAPILLARY: 133 mg/dL — AB (ref 65–99)
Glucose-Capillary: 123 mg/dL — ABNORMAL HIGH (ref 65–99)
Glucose-Capillary: 106 mg/dL — ABNORMAL HIGH (ref 65–99)
Glucose-Capillary: 127 mg/dL — ABNORMAL HIGH (ref 65–99)

## 2017-02-07 LAB — DIGOXIN LEVEL: Digoxin Level: 0.4 ng/mL — ABNORMAL LOW (ref 0.8–2.0)

## 2017-02-07 MED ORDER — CHLORHEXIDINE GLUCONATE 0.12 % MT SOLN
OROMUCOSAL | Status: AC
  Administered 2017-02-07: 15 mL via OROMUCOSAL
  Filled 2017-02-07: qty 15

## 2017-02-07 MED ORDER — MIDAZOLAM HCL 2 MG/2ML IJ SOLN
2.0000 mg | INTRAMUSCULAR | Status: DC | PRN
  Administered 2017-02-07 – 2017-02-12 (×13): 2 mg via INTRAVENOUS
  Filled 2017-02-07 (×14): qty 2

## 2017-02-07 NOTE — Progress Notes (Signed)
   Subjective/Chief Complaint: On vent, family at bedside No acute changes   Objective: Vital signs in last 24 hours: Temp:  [97.6 F (36.4 C)-99.4 F (37.4 C)] 99.4 F (37.4 C) (12/08 0356) Pulse Rate:  [46-65] 62 (12/08 0755) Resp:  [9-20] 18 (12/08 0755) BP: (90-123)/(56-81) 94/62 (12/08 0755) SpO2:  [95 %-99 %] 98 % (12/08 0755) FiO2 (%):  [40 %] 40 % (12/08 0755) Weight:  [69.7 kg (153 lb 10.6 oz)] 69.7 kg (153 lb 10.6 oz) (12/08 0256) Last BM Date: (pta)  Intake/Output from previous day: 12/07 0701 - 12/08 0700 In: 1526.5 [I.V.:606.5; NG/GT:870; IV Piggyback:50] Out: 825 [Urine:825] Intake/Output this shift: No intake/output data recorded.  Exam: Abdomen soft, non-tender hernia, mostly reducible  Lab Results:  Recent Labs    02/05/17 0422 02/06/17 0236  WBC 11.7* 12.1*  HGB 9.1* 10.7*  HCT 30.8* 35.6*  PLT 146* 181   BMET Recent Labs    02/05/17 0422 02/06/17 0236  NA 138 136  K 4.8 4.7  CL 98* 99*  CO2 32 29  GLUCOSE 102* 141*  BUN 32* 28*  CREATININE 0.76 0.78  CALCIUM 8.1* 8.0*   PT/INR No results for input(s): LABPROT, INR in the last 72 hours. ABG Recent Labs    02/06/17 0455  PHART 7.406  HCO3 31.9*    Studies/Results: Dg Chest Port 1 View  Result Date: 02/06/2017 CLINICAL DATA:  Check endotracheal tube placement EXAM: PORTABLE CHEST 1 VIEW COMPARISON:  02/05/2017 FINDINGS: Cardiac shadow is again enlarged. Endotracheal tube and nasogastric catheter are noted in satisfactory position. Bilateral pleural effusions are again noted with bibasilar infiltrates. Some mild vascular congestion and edema is noted as well. IMPRESSION: Overall stable appearance of the chest. Electronically Signed   By: Alcide CleverMark  Lukens M.D.   On: 02/06/2017 08:29    Anti-infectives: Anti-infectives (From admission, onward)   Start     Dose/Rate Route Frequency Ordered Stop   02/04/17 1000  cefTRIAXone (ROCEPHIN) 1 g in dextrose 5 % 50 mL IVPB     1 g 100 mL/hr  over 30 Minutes Intravenous Every 24 hours 02/04/17 0908 02/09/17 0959   02/02/17 1200  cefTAZidime (FORTAZ) 1 g in dextrose 5 % 50 mL IVPB  Status:  Discontinued     1 g 100 mL/hr over 30 Minutes Intravenous Every 12 hours 02/02/17 1129 02/04/17 0908      Assessment/Plan:  Incisional hernia  No acute changes.  No acute surgery needed at this point Will see again Monday   LOS: 6 days    [place]ada Osterhout A 02/07/2017

## 2017-02-07 NOTE — Progress Notes (Addendum)
Stopped tube feedings at 530. In line secretions are thick, tan, and appear to have tube feeding present. Reported to CCM.

## 2017-02-07 NOTE — Progress Notes (Signed)
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  Since admission he has developed increasing oxygen requirements and is developed atrial fibrillation with rapid ventricular response.  An amiodarone infusion was initiated this morning and he is already responded with a heart rate in the 80s during my examination.  He denies any increase in his usual cough or any subjective increase in dyspnea.  He is not having any sort of chest pain including including pleuritic chest pain.   SUBJECTIVE:  No significant changes.  Requires sedation with analgesics due to his right hip pain and his chest fracture pain  VITAL SIGNS: BP 98/62   Pulse (!) 58   Temp 97.6 F (36.4 C) (Oral)   Resp 18   Ht 5\' 6"  (1.676 m)   Wt 153 lb 10.6 oz (69.7 kg)   SpO2 97%   BMI 24.80 kg/m   VENTILATOR SETTINGS: Vent Mode: PRVC FiO2 (%):  [40 %] 40 % Set Rate:  [18 bmp] 18 bmp Vt Set:  [510 mL] 510 mL PEEP:  [5 cmH20] 5 cmH20 Plateau Pressure:  [18 cmH20-29 cmH20] 29 cmH20  INTAKE / OUTPUT: I/O last 3 completed shifts: In: 2220.5 [I.V.:606.5; WU/JW:1191G/GT:1564; IV Piggyback:50] Out: 1375 [Urine:1375]  PHYSICAL EXAMINATION: General: Frail elderly male.  To voice follows simple commands.  Decays pain at chest and right hip where he has fractures. HEENT: Endotracheal tube to vent PSY: Dull effect Neuro: Follows commands CV: Heart sounds are distant PULM: Breath sounds in the base YN:WGNFGI:soft, non-tender, bsx4 active  Extremities: warm/dry, positive edema, right hip fracture extremely tender to any kind of  motion. Skin: no rashes or lesions   LABS:  BMET Recent Labs  Lab 02/04/17 0219 02/05/17 0422 02/06/17 0236  NA 138 138 136  K 4.7 4.8 4.7  CL 101 98* 99*  CO2 30 32 29  BUN 41* 32* 28*  CREATININE 0.91 0.76 0.78  GLUCOSE 125* 102* 141*   Electrolytes Recent Labs  Lab 02/03/17 0201 02/03/17 1631 02/04/17 0219 02/05/17 0422 02/06/17 0236  CALCIUM 8.0*  --  8.0* 8.1* 8.0*  MG 1.9 2.1  --   --  2.2  PHOS 1.5* 2.8  --   --  3.0   CBC Recent Labs  Lab 02/04/17 0620 02/05/17 0422 02/06/17 0236  WBC 15.1* 11.7* 12.1*  HGB 9.2* 9.1* 10.7*  HCT 30.3* 30.8* 35.6*  PLT 146* 146* 181   Coag's Recent Labs  Lab 02/05/2017 2334 02/01/17 1029 02/02/17 0229  APTT  --  35  --   INR 1.17 1.19 1.21   Sepsis Markers Recent Labs  Lab 02/01/17 0155  LATICACIDVEN 0.73   ABG Recent Labs  Lab 02/02/17 1659 02/03/17 1048 02/06/17 0455  PHART 7.337* 7.332* 7.406  PCO2ART 50.8* 56.8* 51.8*  PO2ART 63.0* 85.0 76.7*   Liver Enzymes Recent Labs  Lab 02/02/17 0229 02/03/17 0201  AST 20 30  ALT 11* 12*  ALKPHOS 112 88  BILITOT 1.0 0.8  ALBUMIN 3.0* 2.6*   Cardiac Enzymes Recent Labs  Lab 02/01/17 0144 02/01/17 1029  TROPONINI <0.03 <0.03   Glucose Recent Labs  Lab 02/06/17 0750 02/06/17 1137 02/06/17 1554 02/06/17 2025 02/06/17 2349 02/07/17 0355  GLUCAP 154* 126* 123* 106* 120* 123*   Imaging No results found. STUDIES:  CTA 12/2- copd, no pe  DISCUSSION: This is an 83 year old with O2 dependent COPD at baseline who is suffered from a fall with a subsequent right pelvic fracture.  We are asked to see the patient because of increasing oxygen requirement, and atrial fibrillation with rapid ventricular response.  There is no overt provocation by history exam or x-ray for an increased oxygen requirement and if his creatinine returns normal I think a CTA as you have ordered is prudent.  He is unable to wean from ventilator at this time secondary to pain  from fractured pelvis and fractured rib.  He will most likely the need end-of-life discussions or tracheostomy to be liberated from mechanical ventilatory support in a more leisurely rate.  ASSESSMENT / PLAN:  PULMONARY A: Vent dependent respiratory failure in the setting of chronic obstructive pulmonary disease O2 dependent. Fractured rib.  And right hip fracture Chronic pain that requires high dose fentanyl infusion. P:   No trach/peg per family.  02/07/2017 the family is continuing to have discussions concerning options for for continued care. Will terminally extubate when family is ready, likely next week Discussing DNR  CARDIOVASCULAR A:  Atrial fibrillation ventricular response Paroxysmal atrial fibrillation P:  Hold further lasix Tele monitoring  RENAL Lab Results  Component Value Date   CREATININE 0.78 02/06/2017   CREATININE 0.76 02/05/2017   CREATININE 0.91 02/04/2017   Recent Labs  Lab 02/04/17 0219 02/05/17 0422 02/06/17 0236  K 4.7 4.8 4.7   A:   No acute issues P:   BMET daily Replace electrolytes as indicated KVO IVF  GASTROINTESTINAL A:   GI protection Abdominal hernia from previous surgical incision P:   PPI Surgical consult performed on 02/04/2017.  No intervention required TF per nutrition  HEMATOLOGIC Recent Labs    02/05/17 0422 02/06/17 0236  HGB 9.1* 10.7*   A:   DVT protection P:  PAS hose  INFECTIOUS A:   Suspected aspiration P:   Ceftriaxone started on 02/04/2017 12 3 sputum cultures consistent with normal respiratory flora.  There is a stop date on ceftriaxone.  ENDOCRINE CBG (last 3)  Recent Labs    02/06/17 2025 02/06/17 2349 02/07/17 0355  GLUCAP 106* 120* 123*   A:   Glucose within normal parameters P:   Follow glucose daily  NEUROLOGIC A:   Sedation for vent tolerance P:   [name] goal: -1 D/C wake up assessment but he does arouse to voice and follows some commands 02/07/2017 Fentanyl for  comfort  FAMILY  - Updates: Spoke with family, no trach/peg, deciding on withdrawal when family arrives but given snow this weekend, may need to be next week. 02/07/2017 S Minor NP spoke at length with son concerning options for care. Son states more family members are arriving today and further discussions will be entertained at that time.  - Inter-disciplinary family meet or Palliative Care meeting due by:  day 7  [facility] Minor ACNP Adolph Pollack PCCM Pager [phone] till 1 pm If no answer page [phone] 02/07/2017, 8:28 AM  Attending Note:  83 year old s/p fall with hip fracture and rib fractures who evidently has been very depressed for the past year due to being ill and frequent hospitalization.  Patient is having a lot of pain from rib fractures and is unable to wean.  Ortho  does not feel the patient is a candidate for surgical repair of the hip.  After a long discussion with the family, the patient would not want trach/peg.  The family is awaiting arrival of the rest of the family to say their goodbyes and will likely withdraw towards the end of the week.  Discussion code status now.  Recommend full DNR.  The patient is critically ill with multiple organ systems failure and requires high complexity decision making for assessment and support, frequent evaluation and titration of therapies, application of advanced monitoring technologies and extensive interpretation of multiple databases.   Critical Care Time devoted to patient care services described in this note is  35  Minutes. This time reflects time of care of this signee Dr Koren BoundWesam Roshunda Keir. This critical care time does not reflect procedure time, or teaching time or supervisory time of PA/NP/Med student/Med Resident etc but could involve care discussion time.  Alyson ReedyWesam G. Fredrick Geoghegan, M.D. [facility]/Critical [facility] Medicine. Pager: [phone]. After hours pager: [phone].

## 2017-02-08 ENCOUNTER — Inpatient Hospital Stay (HOSPITAL_COMMUNITY): Payer: Medicare Other

## 2017-02-08 LAB — CBC WITH DIFFERENTIAL/PLATELET
Basophils Absolute: 0 10*3/uL (ref 0.0–0.1)
Basophils Relative: 0 %
EOS ABS: 0.1 10*3/uL (ref 0.0–0.7)
EOS PCT: 1 %
HCT: 32.9 % — ABNORMAL LOW (ref 39.0–52.0)
Hemoglobin: 9.9 g/dL — ABNORMAL LOW (ref 13.0–17.0)
LYMPHS ABS: 1.2 10*3/uL (ref 0.7–4.0)
Lymphocytes Relative: 9 %
MCH: 26.1 pg (ref 26.0–34.0)
MCHC: 30.1 g/dL (ref 30.0–36.0)
MCV: 86.8 fL (ref 78.0–100.0)
MONO ABS: 0.8 10*3/uL (ref 0.1–1.0)
Monocytes Relative: 6 %
Neutro Abs: 11 10*3/uL — ABNORMAL HIGH (ref 1.7–7.7)
Neutrophils Relative %: 84 %
PLATELETS: 204 10*3/uL (ref 150–400)
RBC: 3.79 MIL/uL — AB (ref 4.22–5.81)
RDW: 15.7 % — AB (ref 11.5–15.5)
WBC: 13.1 10*3/uL — AB (ref 4.0–10.5)

## 2017-02-08 LAB — BASIC METABOLIC PANEL
Anion gap: 7 (ref 5–15)
BUN: 22 mg/dL — AB (ref 6–20)
CALCIUM: 8 mg/dL — AB (ref 8.9–10.3)
CO2: 30 mmol/L (ref 22–32)
CREATININE: 0.69 mg/dL (ref 0.61–1.24)
Chloride: 100 mmol/L — ABNORMAL LOW (ref 101–111)
GFR calc Af Amer: >60 mL/min (ref >60)
Glucose, Bld: 96 mg/dL (ref 65–99)
POTASSIUM: 4.2 mmol/L (ref 3.5–5.1)
SODIUM: 137 mmol/L (ref 135–145)

## 2017-02-08 LAB — PHOSPHORUS: Phosphorus: 2.8 mg/dL (ref 2.5–4.6)

## 2017-02-08 LAB — GLUCOSE, CAPILLARY
GLUCOSE-CAPILLARY: 108 mg/dL — AB (ref 65–99)
GLUCOSE-CAPILLARY: 108 mg/dL — AB (ref 65–99)
Glucose-Capillary: 94 mg/dL (ref 65–99)
Glucose-Capillary: 83 mg/dL (ref 65–99)
Glucose-Capillary: 100 mg/dL — ABNORMAL HIGH (ref 65–99)
Glucose-Capillary: 117 mg/dL — ABNORMAL HIGH (ref 65–99)

## 2017-02-08 LAB — MAGNESIUM: MAGNESIUM: 2.1 mg/dL (ref 1.7–2.4)

## 2017-02-08 NOTE — Progress Notes (Signed)
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  Since admission he has developed increasing oxygen requirements and is developed atrial fibrillation with rapid ventricular response.  An amiodarone infusion was initiated this morning and he is already responded with a heart rate in the 80s during my examination.  He denies any increase in his usual cough or any subjective increase in dyspnea.  He is not having any sort of chest pain including including pleuritic chest pain.   SUBJECTIVE:  Sedated on ventilator, he is restless when stimulated VITAL SIGNS: BP (!) 93/52   Pulse (!) 50   Temp 98.2 F (36.8 C) (Oral)   Resp 18   Ht 5\' 6"  (1.676 m)   Wt 153 lb 10.6 oz (69.7 kg)   SpO2 98%   BMI 24.80 kg/m   VENTILATOR SETTINGS: Vent Mode: PRVC FiO2 (%):  [40 %] 40 % Set Rate:  [18 bmp] 18 bmp Vt Set:  [510 mL] 510 mL PEEP:  [5 cmH20] 5 cmH20 Plateau Pressure:  [18 cmH20-22 cmH20] 20 cmH20  INTAKE / OUTPUT: I/O last 3 completed shifts: In: 3232 [I.V.:1742; NG/GT:1440; IV Piggyback:50] Out: 1750 [Urine:1750]  PHYSICAL EXAMINATION: General: elderly white male; sedated on vent, agitated when stimulated HEENT: NCAT, orally intubated.  PSY: agitated at times Neuro: moves extremities, f/c at times.  CV: distant. AF on tele  PULM: course and diffuse rales  WU:JWJXGI:soft, not tender. + bowel sounds  Extremities: W&D, + LE edema. Pain w/ any movement particularly the hip/pelvis    LABS:  BMET Recent Labs  Lab 02/05/17 0422 02/06/17 0236 02/08/17 0222  NA 138 136 137  K 4.8 4.7  4.2  CL 98* 99* 100*  CO2 32 29 30  BUN 32* 28* 22*  CREATININE 0.76 0.78 0.69  GLUCOSE 102* 141* 96   Electrolytes Recent Labs  Lab 02/03/17 1631  02/05/17 0422 02/06/17 0236 02/08/17 0222  CALCIUM  --    < > 8.1* 8.0* 8.0*  MG 2.1  --   --  2.2 2.1  PHOS 2.8  --   --  3.0 2.8   < > = values in this interval not displayed.   CBC Recent Labs  Lab 02/05/17 0422 02/06/17 0236 02/08/17 0222  WBC 11.7* 12.1* 13.1*  HGB 9.1* 10.7* 9.9*  HCT 30.8* 35.6* 32.9*  PLT 146* 181 204   Coag's Recent Labs  Lab 02/01/17 1029 02/02/17 0229  APTT 35  --   INR 1.19 1.21   Sepsis Markers No results for input(s): LATICACIDVEN, PROCALCITON, O2SATVEN in the last 168 hours. ABG Recent Labs  Lab 02/02/17 1659 02/03/17 1048 02/06/17 0455  PHART 7.337* 7.332* 7.406  PCO2ART 50.8* 56.8* 51.8*  PO2ART 63.0* 85.0 76.7*   Liver Enzymes Recent Labs  Lab 02/02/17 0229 02/03/17 0201  AST 20 30  ALT 11* 12*  ALKPHOS 112 88  BILITOT 1.0 0.8  ALBUMIN 3.0* 2.6*   Cardiac Enzymes Recent Labs  Lab 02/01/17 1029  TROPONINI <0.03   Glucose Recent Labs  Lab 02/07/17 1149 02/07/17 1635 02/07/17 2016 02/07/17 2331 02/08/17 0537 02/08/17 0741  GLUCAP 106*  133* 127* 102* 94 83   Imaging Dg Chest Port 1 View  Result Date: 02/08/2017 CLINICAL DATA:  Followup for respiratory failure. EXAM: PORTABLE CHEST 1 VIEW COMPARISON:  02/06/2017 FINDINGS: There is vascular congestion with bilateral interstitial opacities and more confluent lung base opacity obscuring the left mostly obscuring the right hemidiaphragms. This is similar to the prior exam. Findings are most consistent with pulmonary edema with bilateral pleural effusions. No new lung abnormalities. No pneumothorax. Endotracheal tube and nasal/ orogastric tube are stable and well positioned. IMPRESSION: 1. Persistent lung opacities most consistent with pulmonary edema. No significant change from the previous day's exam.  Electronically Signed   By: Amie Portland M.D.   On: 02/08/2017 07:14   STUDIES:  CTA 12/2- copd, no pe  DISCUSSION: This is an 83 year old with O2 dependent COPD at baseline who is suffered from a fall with a subsequent right pelvic fracture.  We are asked to see the patient because of increasing oxygen requirement, and atrial fibrillation with rapid ventricular response.  There is no overt provocation by history exam or x-ray for an increased oxygen requirement and if his creatinine returns normal I think a CTA as you have ordered is prudent.  He is unable to wean from ventilator at this time secondary to pain from fractured pelvis and fractured rib.  Now full DO NOT RESUSCITATE will be a one-way extubation when all family available  ASSESSMENT / PLAN:  Right pelvic fracture and rib fracture with acute on chronic pain Plan As needed analgesia Not an operative candidate.  Strict nonweightbearing of right lower extremity  Vent dependent respiratory failure in the setting of chronic obstructive pulmonary disease O2 dependent. Family does not want trach or PEG; plan for one-way extubation when family arrives  Plan DO NOT RESUSCITATE PAD protocol with [name] goal 0--1 Continuing ceftriaxone; today will be day 7 of 7 Continue bronchodilators Prednisone taper   Atrial fibrillation ventricular response Paroxysmal atrial fibrillation Plan Holding further diuresis Continuing telemetry monitoring Not an anticoagulation candidate Continuing digoxin  Abdominal hernia from previous surgical incision: Seen by surgery no intervention indicated Protein calorie malnutrition in setting of critical illness Plan Continuing tube feeds PPI  Chronic anemia No evidence of bleeding Plan Trend serial CBC Conservative transfusion measures Continuing SCDs    FAMILY  - Updates: Spoke with family, no trach/peg, deciding on withdrawal when family arrives but given snow this weekend, may need to be  next week. 02/07/2017 S Minor NP spoke at length with son concerning options for care. Son states more family members are arriving today and further discussions will be entertained at that time.  - Inter-disciplinary family meet or Palliative Care meeting due by:  day 7   DVT prophylaxis: SCDs SUP: PPI Diet: Tube feed Activity: Bedrest Disposition : ICU  Simonne Martinet ACNP-BC [facility] Pulmonary/Critical Care Pager # [phone] OR # [phone] if no answer  Attending Note:  83 year old male s/p fall with hip and multiple rib fracture that is intubated and failing to wean.  On exam, heavily sedate with diffuse rales.  I reviewed CXR myself, ETT is in good position.  Discussed with PCCM-NP and bedside RN.  Will maintain comfortable for now.  Family does not wish for trach/peg.  They are waiting on additional family to arrive but given weather conditions they were unable to come in.  Will keep comfortable and awaite family timing for withdrawal.  Adjust vent for ABG.  Will need to discuss code status when  family is available, none today given weather conditions.  The patient is critically ill with multiple organ systems failure and requires high complexity decision making for assessment and support, frequent evaluation and titration of therapies, application of advanced monitoring technologies and extensive interpretation of multiple databases.   Critical Care Time devoted to patient care services described in this note is  35  Minutes. This time reflects time of care of this signee Dr Neil Terry. This critical care time does not reflect procedure time, or teaching time or supervisory time of PA/NP/Med student/Med Resident etc but could involve care discussion time.  Alyson ReedyWesam G. Terry, M.D. [facility] Pulmonary/Critical Care Medicine. Pager: [phone]. After hours pager: [phone].

## 2017-02-09 LAB — GLUCOSE, CAPILLARY
GLUCOSE-CAPILLARY: 96 mg/dL (ref 65–99)
GLUCOSE-CAPILLARY: 119 mg/dL — AB (ref 65–99)
Glucose-Capillary: 109 mg/dL — ABNORMAL HIGH (ref 65–99)
Glucose-Capillary: 109 mg/dL — ABNORMAL HIGH (ref 65–99)
Glucose-Capillary: 111 mg/dL — ABNORMAL HIGH (ref 65–99)
Glucose-Capillary: 120 mg/dL — ABNORMAL HIGH (ref 65–99)

## 2017-02-09 MED ORDER — HYDROMORPHONE HCL 1 MG/ML IJ SOLN
1.0000 mg | INTRAMUSCULAR | Status: DC | PRN
  Administered 2017-02-09 – 2017-02-11 (×5): 1 mg via INTRAVENOUS
  Filled 2017-02-09 (×5): qty 1

## 2017-02-09 MED ORDER — ACETAMINOPHEN 160 MG/5ML PO SOLN
500.0000 mg | Freq: Two times a day (BID) | ORAL | Status: DC
  Administered 2017-02-09 – 2017-02-12 (×7): 500 mg
  Filled 2017-02-09 (×9): qty 20.3

## 2017-02-09 MED ORDER — MORPHINE SULFATE 15 MG PO TABS
15.0000 mg | ORAL_TABLET | Freq: Three times a day (TID) | ORAL | Status: DC
  Administered 2017-02-09 – 2017-02-11 (×9): 15 mg
  Filled 2017-02-09 (×9): qty 1

## 2017-02-09 NOTE — Progress Notes (Signed)
Goals of care meeting with 3 sons They understand his prognosis and that he has slim to no chances of leaving the hospital or returning to his prior condition. They are conflicted about withdrawing ventilator and hastening his death. They understand that he is in severe pain and that he is suffering by being on the ventilator. Have a sister in [place] who would like to visit. They did not want to agree to care limitations at this time but would discuss more among themselves and get back to us  Rakesh V. Vassie LollAlva MD

## 2017-02-09 NOTE — Care Management Note (Signed)
Case Management Note Previous Note Created by Letha Capeeborah Taylor  Patient Details  Name: Neil Terry MRN: [id number] Date of Birth: 12/02/1927  Subjective/Objective:  From home with son, [name]. NCM spoke with patient's son [name], he would like for patient to be able [facility] , [place] [facility] looking to see if patient is a candidate.  On 12/4 , she states he will need two more days in ICU.  Will discuss patient in  LOS for LTACH.   12/6 1541 Letha Capeeborah Taylor RN, BSN - Discussed in LOS, [facility] enough. [name] with [facility] following for LTACH.  12/7 1520 Letha Capeeborah Taylor RN, BSN-He is unable to wean from ventilator at this time secondary to pain from fractured pelvis and fractured rib.  He will most likely the need end-of-life discussions or tracheostomy to be liberated from mechanical ventilatory support in a more leisurely rate per PCCM note.  Per family does not want trach or peg, will terminally extubate when family is ready, likely next week.                     Action/Plan: NCM will follow for dc needs.   Expected Discharge Date:                  Expected Discharge Plan:  Long Term Acute Care (LTAC)  In-House Referral:     Discharge planning Services  CM Consult  Post Acute Care Choice:    Choice offered to:     DME Arranged:    DME Agency:     HH Arranged:    HH Agency:     Status of Service:  In process, will continue to follow  If discussed at Long Length of Stay Meetings, dates discussed:    Additional Comments: 02/09/2017 GOC discussions ongoing.  Pt has very poor prognosis and recommendation is for withdrawal of care [name], [name], RN 02/09/2017, 3:46 PM

## 2017-02-09 NOTE — Progress Notes (Signed)
   Subjective/Chief Complaint: Remains intubated CCM's plans noted   Objective: Vital signs in last 24 hours: Temp:  [97.3 F (36.3 C)-98.3 F (36.8 C)] 97.3 F (36.3 C) (12/10 0401) Pulse Rate:  [45-83] 47 (12/10 0600) Resp:  [16-25] 18 (12/10 0600) BP: (67-134)/(45-73) 93/57 (12/10 0600) SpO2:  [96 %-100 %] 100 % (12/10 0600) FiO2 (%):  [40 %] 40 % (12/10 0600) Weight:  [71.9 kg (158 lb 8.2 oz)] 71.9 kg (158 lb 8.2 oz) (12/10 0500) Last BM Date: (pta)  Intake/Output from previous day: 12/09 0701 - 12/10 0700 In: 421.6 [I.V.:421.6] Out: 675 [Urine:675] Intake/Output this shift: No intake/output data recorded.  Exam: Abdomen soft, hernia unchanged, only partially reducible, questionably tender  Lab Results:  Recent Labs    02/08/17 0222  WBC 13.1*  HGB 9.9*  HCT 32.9*  PLT 204   BMET Recent Labs    02/08/17 0222  NA 137  K 4.2  CL 100*  CO2 30  GLUCOSE 96  BUN 22*  CREATININE 0.69  CALCIUM 8.0*   PT/INR No results for input(s): LABPROT, INR in the last 72 hours. ABG No results for input(s): PHART, HCO3 in the last 72 hours.  Invalid input(s): PCO2, PO2  Studies/Results: Dg Chest Port 1 View  Result Date: 02/08/2017 CLINICAL DATA:  Followup for respiratory failure. EXAM: PORTABLE CHEST 1 VIEW COMPARISON:  02/06/2017 FINDINGS: There is vascular congestion with bilateral interstitial opacities and more confluent lung base opacity obscuring the left mostly obscuring the right hemidiaphragms. This is similar to the prior exam. Findings are most consistent with pulmonary edema with bilateral pleural effusions. No new lung abnormalities. No pneumothorax. Endotracheal tube and nasal/ orogastric tube are stable and well positioned. IMPRESSION: 1. Persistent lung opacities most consistent with pulmonary edema. No significant change from the previous day's exam. Electronically Signed   By: Amie Portlandavid  Ormond M.D.   On: 02/08/2017 07:14     Anti-infectives: Anti-infectives (From admission, onward)   Start     Dose/Rate Route Frequency Ordered Stop   02/04/17 1000  cefTRIAXone (ROCEPHIN) 1 g in dextrose 5 % 50 mL IVPB     1 g 100 mL/hr over 30 Minutes Intravenous Every 24 hours 02/04/17 0908 02/09/17 0959   02/02/17 1200  cefTAZidime (FORTAZ) 1 g in dextrose 5 % 50 mL IVPB  Status:  Discontinued     1 g 100 mL/hr over 30 Minutes Intravenous Every 12 hours 02/02/17 1129 02/04/17 0908      Assessment/Plan:  Chronically incarcerated incisional hernia with evidence of bowel compromise with resp failure, on vent.  Family possibly going to withdrawal support.  No plans for surgical intervention. Will sign off.  Please call back if plans change.  LOS: 8 days    Kealey Kemmer A 02/09/2017

## 2017-02-09 NOTE — Progress Notes (Signed)
   02/09/17 1500  Clinical Encounter Type  Visited With Health care provider (CSW)  Referral From Nurse  Consult/Referral To Chaplain   2H Nurse paged in relationship to this patient asking for any assistance about expediting a VISA for the daughter of the patient to come from [place].  I connect with a CSW and they will see if there is anything they can do.  Reported back to the Nurse.  Will follow as needed. Chaplain Agustin CreeNewton Khamari Sheehan

## 2017-02-09 NOTE — Progress Notes (Signed)
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 He has h/o abdominal hernias with a bowel obstruction in November 2017 treated  Conservatively.  Course complicated by atrial fibrillation controlled by amiodarone drip and respiratory failure requiring mechanical ventilation   ETT 12/3  SUBJECTIVE:  Sedated on ventilator, high fent requirements, grimacing with breakthrough pain per RN afebrile  VITAL SIGNS: BP 102/61   Pulse (!) 48   Temp (!) 97.3 F (36.3 C) (Axillary)   Resp 18   Ht 5\' 6"  (1.676 m)   Wt 158 lb 8.2 oz (71.9 kg)   SpO2 99%   BMI 25.58 kg/m   VENTILATOR SETTINGS: Vent Mode: PRVC FiO2 (%):  [40 %] 40 % Set Rate:  [18 bmp] 18 bmp Vt Set:  [510 mL] 510 mL PEEP:  [5 cmH20] 5 cmH20 Plateau Pressure:  [19 cmH20-24 cmH20] 21 cmH20  INTAKE / OUTPUT: I/O last 3 completed shifts: In: 421.6 [I.V.:421.6] Out: 1400 [Urine:1400]  PHYSICAL EXAMINATION: General: elderly white male; sedated on vent, agitated when stimulated HEENT: NCAT, orally intubated.  PSY: agitated at times Neuro: moves extremities, f/c at times.  CV: distant. AF on tele  PULM: decreased BL  WU:JWJXGI:soft, not tender. + bowel sounds  Extremities: W&D, + LE edema. Pain w/ any movement particularly the hip/pelvis    LABS:  BMET Recent Labs  Lab 02/05/17 0422 02/06/17 0236 02/08/17 0222  NA 138 136 137  K 4.8 4.7 4.2  CL 98* 99* 100*  CO2 32 29 30  BUN 32* 28* 22*  CREATININE 0.76 0.78 0.69  GLUCOSE 102* 141* 96   Electrolytes Recent Labs  Lab 02/03/17 1631  02/05/17 0422  02/06/17 0236 02/08/17 0222  CALCIUM  --    < > 8.1* 8.0* 8.0*  MG 2.1  --   --  2.2 2.1  PHOS 2.8  --   --  3.0 2.8   < > = values in this interval not displayed.   CBC Recent Labs  Lab 02/05/17 0422 02/06/17 0236 02/08/17 0222  WBC 11.7* 12.1* 13.1*  HGB 9.1* 10.7* 9.9*  HCT 30.8* 35.6* 32.9*  PLT 146* 181 204   Coag's No results for input(s): APTT, INR in the last 168 hours. Sepsis Markers No results for input(s): LATICACIDVEN, PROCALCITON, O2SATVEN in the last 168 hours. ABG Recent Labs  Lab 02/02/17 1659 02/03/17 1048 02/06/17 0455  PHART 7.337* 7.332* 7.406  PCO2ART 50.8* 56.8* 51.8*  PO2ART 63.0* 85.0 76.7*   Liver Enzymes Recent Labs  Lab 02/03/17 0201  AST 30  ALT 12*  ALKPHOS 88  BILITOT 0.8  ALBUMIN 2.6*   Cardiac Enzymes No results for input(s): TROPONINI, PROBNP in the last 168 hours. Glucose Recent Labs  Lab 02/08/17 0741 02/08/17 1129 02/08/17 1631 02/08/17 2129 02/08/17 2310 02/09/17 0425  GLUCAP 83 100* 108* 108* 117* 109*   Imaging No results found.   STUDIES:  CTA 12/2- copd, no pe  DISCUSSION: He has been  unable to wean from ventilator  secondary to pain from fractured pelvis and fractured rib.  Now full DO NOT RESUSCITATE will be a one-way extubation  when all family available  ASSESSMENT / PLAN:  Acute respiratory failure  COPD O2 dependent. Family does not want trach or PEG; plan for one-way extubation when family arrives  Plan DO NOT RESUSCITATE Continue bronchodilators Prednisone taper  Acute pulmonary edema Atrial fibrillation ventricular response Paroxysmal atrial fibrillation Plan Lasix 40 daily  telemetry  Not an anticoagulation candidate Continuing digoxin  Chronically incarcerated incisional hernia : Seen by surgery not a candidate for surgery Protein calorie malnutrition in setting of critical illness Plan TFs held x 48h due to suspected aspiration episode 12/8 Resume trickle  12/10 PPI   Right pelvic fracture and rib fracture with acute on chronic pain, breakthrough on max fent gtt Plan Not an operative candidate.  Strict nonweightbearing of right lower extremity Start morphine IR  Chronic anemia No evidence of bleeding Plan Trend serial CBC Goal 7 & above Continuing SCDs    FAMILY  - Updates: Spoke with family, no trach/peg, deciding on withdrawal when family ready  - Inter-disciplinary family meet or Palliative Care meeting due by: done   DVT prophylaxis: SCDs SUP: PPI Diet: Tube feed Activity: Bedrest Disposition : ICU  The patient is critically ill with multiple organ systems failure and requires high complexity decision making for assessment and support, frequent evaluation and titration of therapies, application of advanced monitoring technologies and extensive interpretation of multiple databases. Critical Care Time devoted to patient care services described in this note independent of APP time is 32 minutes.    Cyril Mourningakesh Shaquera Ansley MD. Tonny BollmanFCCP. [facility] Pager [phone] If no response call [phone]   02/09/2017

## 2017-02-10 ENCOUNTER — Inpatient Hospital Stay (HOSPITAL_COMMUNITY): Payer: Medicare Other

## 2017-02-10 DIAGNOSIS — Z515 Encounter for palliative care: Secondary | ICD-10-CM

## 2017-02-10 DIAGNOSIS — Z7189 Other specified counseling: Secondary | ICD-10-CM

## 2017-02-10 LAB — BASIC METABOLIC PANEL
ANION GAP: 6 (ref 5–15)
BUN: 18 mg/dL (ref 6–20)
CALCIUM: 7.8 mg/dL — AB (ref 8.9–10.3)
CO2: 27 mmol/L (ref 22–32)
Chloride: 101 mmol/L (ref 101–111)
Creatinine, Ser: 0.62 mg/dL (ref 0.61–1.24)
GFR calc Af Amer: >60 mL/min (ref >60)
GLUCOSE: 95 mg/dL (ref 65–99)
Potassium: 3.9 mmol/L (ref 3.5–5.1)
SODIUM: 134 mmol/L — AB (ref 135–145)

## 2017-02-10 LAB — GLUCOSE, CAPILLARY
GLUCOSE-CAPILLARY: 105 mg/dL — AB (ref 65–99)
GLUCOSE-CAPILLARY: 110 mg/dL — AB (ref 65–99)
GLUCOSE-CAPILLARY: 122 mg/dL — AB (ref 65–99)
Glucose-Capillary: 88 mg/dL (ref 65–99)
Glucose-Capillary: 79 mg/dL (ref 65–99)
Glucose-Capillary: 98 mg/dL (ref 65–99)

## 2017-02-10 LAB — CBC
HCT: 32.7 % — ABNORMAL LOW (ref 39.0–52.0)
Hemoglobin: 10.2 g/dL — ABNORMAL LOW (ref 13.0–17.0)
MCH: 27 pg (ref 26.0–34.0)
MCHC: 31.2 g/dL (ref 30.0–36.0)
MCV: 86.5 fL (ref 78.0–100.0)
PLATELETS: 267 10*3/uL (ref 150–400)
RBC: 3.78 MIL/uL — ABNORMAL LOW (ref 4.22–5.81)
RDW: 15.9 % — AB (ref 11.5–15.5)
WBC: 11.7 10*3/uL — AB (ref 4.0–10.5)

## 2017-02-10 LAB — PHOSPHORUS: Phosphorus: 2.7 mg/dL (ref 2.5–4.6)

## 2017-02-10 LAB — MAGNESIUM: MAGNESIUM: 1.9 mg/dL (ref 1.7–2.4)

## 2017-02-10 MED ORDER — FUROSEMIDE 10 MG/ML IJ SOLN
40.0000 mg | Freq: Once | INTRAMUSCULAR | Status: AC
  Administered 2017-02-10: 40 mg via INTRAVENOUS
  Filled 2017-02-10: qty 4

## 2017-02-10 MED ORDER — LORAZEPAM 1 MG PO TABS
1.0000 mg | ORAL_TABLET | Freq: Two times a day (BID) | ORAL | Status: DC
  Administered 2017-02-10 – 2017-02-12 (×5): 1 mg
  Filled 2017-02-10 (×6): qty 1

## 2017-02-10 MED ORDER — LORAZEPAM 2 MG/ML PO CONC: 1.0000 mg | Freq: Two times a day (BID) | ORAL | Status: DC

## 2017-02-10 MED ORDER — VITAL HIGH PROTEIN PO LIQD
1000.0000 mL | ORAL | Status: DC
Start: 2017-02-10 — End: 2017-02-11
  Administered 2017-02-10 – 2017-02-11 (×2): 1000 mL

## 2017-02-10 MED ORDER — SODIUM CHLORIDE 0.9 % IV BOLUS (SEPSIS)
1000.0000 mL | Freq: Once | INTRAVENOUS | Status: AC
  Administered 2017-02-10: 1000 mL via INTRAVENOUS

## 2017-02-10 NOTE — Progress Notes (Signed)
eLink Physician-Brief Progress Note Patient Name: Neil MinksFarid Terry DOB: October 03, 1927 MRN: [id number]   Date of Service  02/10/2017  HPI/Events of Note  Multiple issues: 1. Hypotension - BP = 67/48. Likely related to sedation with Fentanyl and Precedex IV infusions and 2. Oliguria. LVEF = 55% to 60%.  eICU Interventions  Will order: 1. Bolus with 0.9 NaCl 1 liter over 1 hour now.      Intervention Category Major Interventions: Hypotension - evaluation and management  Sommer,Steven Eugene 02/10/2017, 11:19 PM

## 2017-02-10 NOTE — Progress Notes (Signed)
Nutrition Follow-up  DOCUMENTATION CODES:   Non-severe (moderate) malnutrition in context of chronic illness  INTERVENTION:   If aggressive care continues recommend:  Vital AF 1.2 @ 60 ml/hr Provides 1728 kcals, 108 g of protein and 1166 mL of free water  Recommend bowel regimen   NUTRITION DIAGNOSIS:   Moderate Malnutrition related to chronic illness(COPD) as evidenced by mild fat depletion, mild muscle depletion. Ongoing.   GOAL:   Patient will meet greater than or equal to 90% of their needs Progressing.   MONITOR:   TF tolerance, Vent status, Labs, Weight trends  ASSESSMENT:   83 yo male admitted after fall at home with pelvic and hip fracture with retroperitoneal hemorrhage, acute on chronic respiratory failure. Pt with hx of COPD with chronic respiratory failure, abdominal hernias, hx of partial gastrectomy, perforated duodenal ulcer  12/8 possible aspiration event, TF held 12/10 trickle TF restarted - Vital High Protein @ 20 ml/hr (480 kcal, 42 grams protein, and 401 ml free water) Palliative care following for goals of care. Per notes family does not want trach/PEG. Staff working to get daughter from [place] here to see pt.   Patient is currently intubated on ventilator support MV: 9.5 L/min Temp (24hrs), Avg:98.3 F (36.8 C), Min:97.4 F (36.3 C), Max:99.1 F (37.3 C)  Medications reviewed and include: vitamin D, miralax, senokot Labs reviewed: Na 134 (L)   Diet Order:  Diet NPO time specified  EDUCATION NEEDS:   Not appropriate for education at this time  Skin:  Skin Assessment: Reviewed RN Assessment  Last BM:  no documented BM  Height:   Ht Readings from Last 1 Encounters:  02/10/17 5\' 6"  (1.676 m)    Weight:   Wt Readings from Last 1 Encounters:  02/10/17 159 lb 13.3 oz (72.5 kg)    Ideal Body Weight:  64.5 kg  BMI:  Body mass index is 25.8 kg/m.  Estimated Nutritional Needs:   Kcal:  1757 kcals  Protein:  105-140 g   Fluid:   >/= 1.7 L  Kendell BaneHeather Jacey Pelc RD, LDN, CNSC [phone] Pager [phone] After Hours Pager

## 2017-02-10 NOTE — Progress Notes (Signed)
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 He has h/o abdominal hernias with a bowel obstruction in November 2017 treated  Conservatively.  Course complicated by atrial fibrillation controlled by amiodarone drip and respiratory failure requiring mechanical ventilation   ETT 12/3 >>  SUBJECTIVE:  Remains critically ill, on ventilator,  high fent requirements, grimacing with breakthrough pain per RN when lowered afebrile  VITAL SIGNS: BP (!) 73/51   Pulse (!) 56   Temp 98.1 F (36.7 C) (Oral)   Resp 18   Ht 5\' 6"  (1.676 m)   Wt 159 lb 13.3 oz (72.5 kg)   SpO2 95%   BMI 25.80 kg/m   VENTILATOR SETTINGS: Vent Mode: PRVC FiO2 (%):  [30 %-40 %] (P) 30 % Set Rate:  [18 bmp] 18 bmp Vt Set:  [510 mL] 510 mL PEEP:  [5 cmH20] 5 cmH20 Plateau Pressure:  [19 cmH20-26 cmH20] 26 cmH20  INTAKE / OUTPUT: I/O last 3 completed shifts: In: 1541.6 [I.V.:1271.6; Other:120; NG/GT:150] Out: 1570 [Urine:1570]  PHYSICAL EXAMINATION: General: elderly white male; sedated on vent, agitated when stimulated HEENT: NCAT, orally intubated.  PSY: agitated at times Neuro: moves extremities, f/c at times.  CV: distant. AF on tele  PULM: decreased BL  WU:JWJXGI:soft, not tender. + bowel sounds  Extremities: W&D, + LE edema. Pain w/ any movement particularly the hip/pelvis    LABS:  BMET Recent Labs  Lab 02/06/17 0236 02/08/17 0222 02/10/17 0259  NA 136 137 134*  K 4.7 4.2 3.9  CL 99* 100* 101  CO2 29 30 27   BUN 28* 22* 18  CREATININE 0.78 0.69 0.62  GLUCOSE 141* 96 95    Electrolytes Recent Labs  Lab 02/06/17 0236 02/08/17 0222 02/10/17 0259  CALCIUM 8.0* 8.0* 7.8*  MG 2.2 2.1 1.9  PHOS 3.0 2.8 2.7   CBC Recent Labs  Lab 02/06/17 0236 02/08/17 0222 02/10/17 0259  WBC 12.1* 13.1* 11.7*  HGB 10.7* 9.9* 10.2*  HCT 35.6* 32.9* 32.7*  PLT 181 204 267   Coag's No results for input(s): APTT, INR in the last 168 hours. Sepsis Markers No results for input(s): LATICACIDVEN, PROCALCITON, O2SATVEN in the last 168 hours. ABG Recent Labs  Lab 02/06/17 0455  PHART 7.406  PCO2ART 51.8*  PO2ART 76.7*   Liver Enzymes No results for input(s): AST, ALT, ALKPHOS, BILITOT, ALBUMIN in the last 168 hours. Cardiac Enzymes No results for input(s): TROPONINI, PROBNP in the last 168 hours. Glucose Recent Labs  Lab 02/09/17 1705 02/09/17 2003 02/09/17 2335 02/10/17 0353 02/10/17 0753 02/10/17 1135  GLUCAP 109* 111* 120* 88 79 98   Imaging Dg Chest Port 1 View  Result Date: 02/10/2017 CLINICAL DATA:  Hypoxia EXAM: PORTABLE CHEST 1 VIEW COMPARISON:  February 08, 2017 FINDINGS: Endotracheal tube tip is 3.9 cm above the carina. Nasogastric tube tip and side port are below the diaphragm. No pneumothorax. There is persistent interstitial edema with bilateral pleural effusions and bibasilar consolidation. No new opacity is identified compared to 2 days prior. Heart is prominent with pulmonary venous hypertension. Aorta is somewhat prominent and stable. There is aortic atherosclerosis. Patient has  had kyphoplasty procedures in the lower thoracic and upper lumbar regions. There is a stable metallic foreign body in the left upper quadrant. IMPRESSION: Tube positions as described without pneumothorax. Pleural effusions with interstitial edema and cardiomegaly. Suspect a degree of congestive heart failure. The appearance is stable compared to 2 days prior. There is aortic atherosclerosis. No new opacity evident. Aortic Atherosclerosis (ICD10-I70.0). Electronically  Signed   By: Bretta BangWilliam  Woodruff III M.D.   On: 02/10/2017 07:30     STUDIES:  CTA 12/2- copd, no pe  DISCUSSION: He has been  unable to wean from ventilator  secondary to pain from fractured pelvis and fractured rib.  Now full DO NOT RESUSCITATE will be a one-way extubation when all family available  ASSESSMENT / PLAN:  Acute respiratory failure  COPD O2 dependent.   Plan DO NOT RESUSCITATE Continue bronchodilators Prednisone taper Ongoing GOC discussion with sons  Acute pulmonary edema Atrial fibrillation ventricular response Paroxysmal atrial fibrillation Plan Lasix 40 daily if BP permits  telemetry  Not an anticoagulation candidate Continuing digoxin  Chronically incarcerated incisional hernia : Seen by surgery not a candidate for surgery Protein calorie malnutrition in setting of critical illness Plan Resume trickle Ts PPI   Right pelvic fracture and rib fracture with acute on chronic pain, breakthrough on max fent gtt Plan Not an operative candidate.  Strict nonweightbearing of right lower extremity Started morphine IR to try & decrease fent requirements  Chronic anemia No evidence of bleeding Plan Trend serial CBC Goal 7 & above Continuing SCDs    FAMILY  - Updates: Spoke with family, no trach/peg, they are conflicted about vent withdrawal  - Inter-disciplinary family meet or Palliative Care meeting due by: done   DVT prophylaxis: SCDs SUP: PPI Diet: Tube feed Activity: Bedrest Disposition : ICU  The patient is critically ill with multiple organ systems failure and requires high complexity decision making for assessment and support, frequent evaluation and titration of therapies, application of advanced monitoring technologies and extensive interpretation of multiple databases. Critical Care Time devoted to patient care services described in this note independent of APP time is 32 minutes.    Cyril Mourningakesh Alva MD. Tonny BollmanFCCP. [facility] Pager [phone] If no response call [phone]   02/10/2017

## 2017-02-10 NOTE — Progress Notes (Signed)
CSW wrote letter for patient daughter to bring to [place] in [place] to request temporary visa to visit patient prior.  Emailed letter to family in [place] as well as local family and provided pt son with copy of letter.  No further CSW needs at this time- please re-consult if further assistance is needed.  Burna SisJenna H. Rula [place], LCSW Clinical Social Worker [phone]

## 2017-02-10 NOTE — Consult Note (Signed)
Consultation Note Date: 02/10/2017   Patient Name: Neil Terry  DOB: 08/07/1927  MRN: [id number]  Age / Sex: 83 y.o., male  PCP: Patient, No Pcp Per Referring Physician: Rush Farmer, MD  Reason for Consultation: Establishing goals of care  HPI/Patient Profile: 83 y.o. male  with past medical history of COPD on home oxygen, hypertension, GERD, depression, BPH, duodenal ulcer, abdominal hernias with bowel obstruction, and falls admitted on 02/23/2017 with right hip pain after a fall at home. In ED, xray revealed right acetabular and right iliopubic ring fracture. Shortly after arrival to ED, heart rate increased to 140's with rapid ventricular response and intermittent nonsustained ventricular tachycardia. Temp up to 100.6 rectally and with tachypnea. Patient intubated on 12/3 and has been unable to wean secondary to pain from pelvic and rib fracture. Changed to limited code on 12/11. Plan is for one-way extubation when all family arrives. Palliative medicine consultation for goals of care/terminal care.   Clinical Assessment and Goals of Care: I have reviewed medical records, discussed with care team, and completed patient assessment. He remains intubated on Precedex and Fentanyl infusions. The patient opens eyes to voice. Family translates when I ask if he is in pain. Garrison nods his head no.   Met with eight family members in consultation room including three sons. [name] and Essam lead the conversation.  Introduced Palliative Medicine as specialized medical care for people living with serious illness. It focuses on providing relief from the symptoms and stress of a serious illness.  Family shares a brief life review of the patient. His wife died about 10 years ago. They have four sons and two daughters. All children are in the area besides a sister, who is in [place]. Prior to hospitalization, patient living with  son, [name]. He has multiple supportive family members that care for him. He has worn oxygen for many years. Despite using his cane or walker, Essam speaks of 4 falls in the past year. He has continued to surprise the family with how "stubborn" he is. 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   Discussed events leading up to hospitalization, hospital diagnoses, and interventions. Discussed poor prognosis secondary to acute on chronic respiratory failure and inability to wean from ventilator. [name] states "we were in shock" during initial conversations with critical care team but that much of the family is now "at peace" and do not want him to "suffer." Discussed symptom management medications.   Advanced directives, concepts specific to code status, and artifical feeding and hydration were discussed. [name] shares that their mother spent the last few months of her life with trach/feeding tube in a nursing home. After many conversations as a family, [name] confirms their decision against prolonging his life with trach/peg. "He wouldn't want that."   At this point, the family is hopeful that their sister will arrive from [place] by this weekend. She has an interview for expedited  VISA on Thursday. [name] shares that she was unable to be present when their mother died and the importance of trying to get her here before withdrawing care. [name] states "we are still waiting for a miracle" but also seems realistic with poor prognosis.   One family member shares that they are leaving in the "hands of God." Spiritual support provided. [name] again emphasizes the goal to prevent suffering.   [name] is very appreciative of the care his father has received during hospitalization. I left PMT contact information and reassured of our continued support during hospitalization.    SUMMARY OF RECOMMENDATIONS    Discussed diagnoses, interventions, and  poor prognosis.   No trach or peg.  Continue current plan of care per PCCM with focus on relief from pain and suffering.    Planning for withdraw of care/extubation when all family has arrived. Sons remain hopeful that their sister will arrive from [place] by this weekend. I am concerned the patient will not survive till then (currently hypotensive).  PMT will continue to support patient/family through hospitalization.   Code Status/Advance Care Planning:  Limited code: no resuscitation  Symptom Management:   Per attending  Palliative Prophylaxis:   Delirium Protocol, Frequent Pain Assessment, Oral Care and Turn Reposition  Psycho-social/Spiritual:   Desire for further Chaplaincy support: yes  Additional Recommendations: Caregiving  Support/Resources and Education on Hospice  Prognosis:   Poor prognosis: acute on chronic respiratory failure secondary to COPD on home oxygen, acute pulmonary edema, afib RVR, and rib/pelvic fracture with acute on chronic pain (not a surgical candidate).   Discharge Planning: Likely hospital death when transitioned to comfort with compassionate extubation.      Primary Diagnoses: Present on Admission: . Pelvic fracture ([place]) . Hip fracture requiring operative repair, right, closed, initial encounter ([place]) . Acute on chronic respiratory failure with hypoxia ([place]) . COPD with acute exacerbation ([place]) . PAF (paroxysmal atrial fibrillation) ([place]) . BPH (benign prostatic hyperplasia) . Traumatic retroperitoneal hemorrhage . Atrial fibrillation with RVR ([place])   I have reviewed the medical record, interviewed the patient and family, and examined the patient. The following aspects are pertinent.  Past Medical History:  Diagnosis Date  . BPH (benign prostatic hypertrophy)   . Bronchial asthma   . COPD (chronic obstructive pulmonary disease) ([place])   . Depression    hx  . Duodenal ulcer    perforated ~ 2013  . GERD (gastroesophageal reflux  disease)   . HTN (hypertension)   . On home oxygen therapy    "8h/day; don't know how many liters"   Social History   Socioeconomic History  . Marital status: Widowed    Spouse name: None  . Number of children: None  . Years of education: None  . Highest education level: None  Social Needs  . Financial resource strain: None  . Food insecurity - worry: None  . Food insecurity - inability: None  . Transportation needs - medical: None  . Transportation needs - non-medical: None  Occupational History  . None  Tobacco Use  . Smoking status: Former Smoker    Packs/day: 3.00    Years: 50.00    Pack years: 150.00    Types: Cigarettes  . Smokeless tobacco: Never Used  . Tobacco comment: "quit smoking cigarettes in the 1990's"  Substance and Sexual Activity  . Alcohol use: No    Alcohol/week: 0.0 oz  . Drug use: No  . Sexual activity: None  Other Topics Concern  . None  Social History Narrative   Lives with several sons between here and [place] .  Travels between sons's homes   Family History  Problem Relation Age of Onset  . Arrhythmia Son   . Stroke Brother   . Other Mother        Died of old age  . Asthma Father    Scheduled Meds: . acetaminophen (TYLENOL) oral liquid 160 mg/5 mL  500 mg Per Tube BID  . chlorhexidine gluconate (MEDLINE KIT)  15 mL Mouth Rinse BID  . cholecalciferol  2,000 Units Oral Daily  . digoxin  0.125 mg Oral Daily  . feeding supplement (VITAL HIGH PROTEIN)  1,000 mL Per Tube Q24H  . fentaNYL (SUBLIMAZE) injection  50 mcg Intravenous Once  . gabapentin  300 mg Oral TID  . insulin aspart  0-9 Units Subcutaneous Q4H  . ipratropium  0.5 mg Nebulization TID  . levalbuterol  0.63 mg Nebulization TID  . LORazepam  1 mg Per Tube BID  . mouth rinse  15 mL Mouth Rinse 10 times per day  . morphine  15 mg Per Tube TID  . pantoprazole sodium  40 mg Per Tube Daily  . polyethylene glycol  17 g Oral Daily  . potassium chloride  10 mEq Oral Daily  .  pramipexole  0.25 mg Oral QHS  . sennosides  5 mL Oral BID  . sertraline  50 mg Oral QHS  . tamsulosin  0.4 mg Oral QPC supper   Continuous Infusions: . sodium chloride 10 mL/hr at 02/10/17 1600  . dexmedetomidine (PRECEDEX) IV infusion 0.4 mcg/kg/hr (02/10/17 1600)  . fentaNYL infusion INTRAVENOUS 200 mcg/hr (02/10/17 1713)   PRN Meds:.fentaNYL, fentaNYL (SUBLIMAZE) injection, HYDROmorphone (DILAUDID) injection, midazolam Medications Prior to Admission:  Prior to Admission medications   Medication Sig Start Date End Date Taking? Authorizing Provider  acetaminophen (TYLENOL) 500 MG tablet Take 500 mg by mouth every 6 (six) hours as needed for mild pain.    Yes [provider]  aspirin 81 MG EC tablet Take 81 mg by mouth daily.    Yes [provider]  busPIRone (BUSPAR) 7.5 MG tablet Take 7.5 mg by mouth 2 (two) times daily as needed (anxiety).    Yes [provider]  ipratropium-albuterol (DUONEB) 0.5-2.5 (3) MG/3ML SOLN Take 3 mLs by nebulization every 4 (four) hours as needed.    Yes [provider]  potassium chloride (K-DUR) 10 MEQ tablet Take 10 mEq by mouth daily.   Yes [provider]  pramipexole (MIRAPEX) 0.25 MG tablet Take 0.25 mg by mouth at bedtime.   Yes [provider]  senna-docusate (SENOKOT-S) 8.6-50 MG tablet Take 1 tablet by mouth daily as needed for mild constipation.    Yes [provider]  tamsulosin (FLOMAX) 0.4 MG CAPS capsule Take 0.4 mg by mouth daily after supper.    Yes [provider]  albuterol (PROVENTIL HFA;VENTOLIN HFA) 108 (90 Base) MCG/ACT inhaler Inhale 2 puffs into the lungs every 6 (six) hours as needed for wheezing or shortness of breath. 04/09/16   Erlene Quan, PA-C  budesonide-formoterol (SYMBICORT) 80-4.5 MCG/ACT inhaler Inhale 2 puffs into the lungs 2 (two) times daily.    [provider]  Cholecalciferol 1000 units tablet Take 2,000 Units by mouth daily.    [provider]  esomeprazole (NEXIUM) 20 MG capsule Take 20 mg by mouth daily at 12 noon.    [provider]  furosemide (LASIX) 20 MG tablet Take 20  mg by mouth.    [provider]  gabapentin (NEURONTIN) 300 MG capsule Take 300 mg by mouth 3 (three) times daily.    [provider]  metoprolol tartrate (LOPRESSOR) 25 MG tablet Take 25 mg by mouth 2 (two) times daily.     [provider]  polyethylene glycol (MIRALAX / GLYCOLAX) packet Take 17 g by mouth daily. Patient taking differently: Take 17 g by mouth at bedtime.  01/31/16   Reyne Dumas, MD  sertraline (ZOLOFT) 50 MG tablet Take 50 mg by mouth daily.     [provider]   No Known Allergies Review of Systems  Unable to perform ROS: Acuity of condition   Physical Exam  Constitutional: He appears ill. He is sedated and intubated.  HENT:  Head: Normocephalic and atraumatic.  Pulmonary/Chest: No tachypnea. He is intubated. No respiratory distress.  Neurological:  Opens eyes to voice, nods head no when asked if in pain  Skin: Skin is warm and dry.  Nursing note and vitals reviewed.  Vital Signs: BP (!) 73/54   Pulse 62   Temp (!) 97.4 F (36.3 C) (Oral)   Resp 18   Ht '5\' 6"'$  (1.676 m)   Wt 72.5 kg (159 lb 13.3 oz)   SpO2 94%   BMI 25.80 kg/m  Pain Assessment: (P) CPOT POSS *See Group Information*: S-Acceptable,Sleep, easy to arouse Pain Score: Asleep  SpO2: SpO2: 94 % O2 Device:SpO2: 94 % O2 Flow Rate: .O2 Flow Rate (L/min): 10 L/min  IO: Intake/output summary:   Intake/Output Summary (Last 24 hours) at 02/10/2017 1726 Last data filed at 02/10/2017 1600 Gross per 24 hour  Intake 1425.78 ml  Output 1565 ml  Net -139.22 ml    LBM: Last BM Date: (PTA) Baseline Weight: Weight: 73.5 kg (162 lb) Most recent weight: Weight: 72.5 kg (159 lb 13.3 oz)     Palliative Assessment/Data: PPS 10%   Flowsheet Rows     Most Recent Value  Intake Tab  Referral Department   Critical care  Unit at Time of Referral  ICU  Palliative Care Primary Diagnosis  Trauma  Palliative Care Type  New Palliative care  Date first seen by Palliative Care  02/10/17  Clinical Assessment  Palliative Performance Scale Score  10%  Psychosocial & Spiritual Assessment  Palliative Care Outcomes  Patient/Family meeting held?  Yes  Who was at the meeting?  multiple family members including sons  Palliative Care Outcomes  Clarified goals of care, Provided end of life care assistance, Provided psychosocial or spiritual support      Time In: 1600 Time Out: 1710 Time Total: 49mn Greater than 50%  of this time was spent counseling and coordinating care related to the above assessment and plan.  Signed by:  MIhor Dow FNP-C Palliative Medicine Team  Phone: [phone]: [phone]  Please contact Palliative Medicine Team phone at [phone] questions and concerns.  For individual provider: See AShea Evans

## 2017-02-11 DIAGNOSIS — Z7189 Other specified counseling: Secondary | ICD-10-CM

## 2017-02-11 LAB — GLUCOSE, CAPILLARY
GLUCOSE-CAPILLARY: 115 mg/dL — AB (ref 65–99)
GLUCOSE-CAPILLARY: 131 mg/dL — AB (ref 65–99)
Glucose-Capillary: 107 mg/dL — ABNORMAL HIGH (ref 65–99)
Glucose-Capillary: 112 mg/dL — ABNORMAL HIGH (ref 65–99)
Glucose-Capillary: 115 mg/dL — ABNORMAL HIGH (ref 65–99)
Glucose-Capillary: 120 mg/dL — ABNORMAL HIGH (ref 65–99)

## 2017-02-11 LAB — CBC
HEMATOCRIT: 31.3 % — AB (ref 39.0–52.0)
HEMOGLOBIN: 9.5 g/dL — AB (ref 13.0–17.0)
MCH: 26.1 pg (ref 26.0–34.0)
MCHC: 30.4 g/dL (ref 30.0–36.0)
MCV: 86 fL (ref 78.0–100.0)
PLATELETS: 258 10*3/uL (ref 150–400)
RBC: 3.64 MIL/uL — AB (ref 4.22–5.81)
RDW: 15.9 % — ABNORMAL HIGH (ref 11.5–15.5)
WBC: 14.4 10*3/uL — AB (ref 4.0–10.5)

## 2017-02-11 LAB — BASIC METABOLIC PANEL
ANION GAP: 5 (ref 5–15)
BUN: 22 mg/dL — ABNORMAL HIGH (ref 6–20)
CHLORIDE: 104 mmol/L (ref 101–111)
CO2: 27 mmol/L (ref 22–32)
Calcium: 7.5 mg/dL — ABNORMAL LOW (ref 8.9–10.3)
Creatinine, Ser: 0.76 mg/dL (ref 0.61–1.24)
GFR calc non Af Amer: >60 mL/min (ref >60)
Glucose, Bld: 122 mg/dL — ABNORMAL HIGH (ref 65–99)
POTASSIUM: 3.7 mmol/L (ref 3.5–5.1)
SODIUM: 136 mmol/L (ref 135–145)

## 2017-02-11 MED ORDER — VITAL HIGH PROTEIN PO LIQD: 1000.0000 mL | ORAL | Status: DC

## 2017-02-11 NOTE — Progress Notes (Signed)
PULMONARY / CRITICAL CARE MEDICINE   Name: Neil MinksFarid Terry MRN: [id number] DOB: 1927-07-18    ADMISSION DATE:  02/23/2017 CONSULTATION DATE:  02/01/2017  REFERRING MD:  Brett CanalesSheehan, Theresa  CHIEF COMPLAINT:  increasing oxygen requirement and atrial fibrillation with rapid ventricular response  HISTORY OF PRESENT ILLNESS:   This is an 83 year old with oxygen dependent COPD at baseline who suffered from a ground-level fall 12/1. He has h/o abdominal hernias with a bowel obstruction in November 2017 treated  Conservatively.  Course complicated by atrial fibrillation controlled by amiodarone drip and respiratory failure requiring mechanical ventilation   ETT 12/3 >>  SUBJECTIVE:  Remains critically ill, on ventilator,  Appears more comfortable this am on lower fent gtt 200, less pain afebrile  VITAL SIGNS: BP (!) 87/57   Pulse 73   Temp 98.3 F (36.8 C) (Oral)   Resp 20   Ht 5\' 6"  (1.676 m)   Wt 163 lb 5.8 oz (74.1 kg)   SpO2 98%   BMI 26.37 kg/m   VENTILATOR SETTINGS: Vent Mode: PRVC FiO2 (%):  [30 %-40 %] 40 % Set Rate:  [18 bmp] 18 bmp Vt Set:  [510 mL] 510 mL PEEP:  [5 cmH20] 5 cmH20 Plateau Pressure:  [24 cmH20-26 cmH20] 26 cmH20  INTAKE / OUTPUT: I/O last 3 completed shifts: In: 2898.9 [I.V.:1735.6; NG/GT:580; IV Piggyback:583.3] Out: 2570 [Urine:2570]  PHYSICAL EXAMINATION: General: elderly white male; on vent,calm HEENT: NCAT, orally intubated.  PSY: agitated at times Neuro: moves extremities,follows commands CV: distant. AF on tele  PULM: decreased BL  QI:ONGEGI:soft, not tender. + bowel sounds  Extremities: W&D, + LE edema. Pain w/ any movement particularly the hip/pelvis    LABS:  BMET Recent Labs  Lab 02/08/17 0222 02/10/17 0259 02/11/17 0254  NA 137 134* 136  K 4.2 3.9 3.7  CL 100* 101 104  CO2 30 27 27   BUN 22* 18 22*  CREATININE 0.69 0.62 0.76  GLUCOSE 96 95 122*   Electrolytes Recent Labs  Lab 02/06/17 0236 02/08/17 0222 02/10/17 0259  02/11/17 0254  CALCIUM 8.0* 8.0* 7.8* 7.5*  MG 2.2 2.1 1.9  --   PHOS 3.0 2.8 2.7  --    CBC Recent Labs  Lab 02/08/17 0222 02/10/17 0259 02/11/17 0254  WBC 13.1* 11.7* 14.4*  HGB 9.9* 10.2* 9.5*  HCT 32.9* 32.7* 31.3*  PLT 204 267 258   Coag's No results for input(s): APTT, INR in the last 168 hours. Sepsis Markers No results for input(s): LATICACIDVEN, PROCALCITON, O2SATVEN in the last 168 hours. ABG Recent Labs  Lab 02/06/17 0455  PHART 7.406  PCO2ART 51.8*  PO2ART 76.7*   Liver Enzymes No results for input(s): AST, ALT, ALKPHOS, BILITOT, ALBUMIN in the last 168 hours. Cardiac Enzymes No results for input(s): TROPONINI, PROBNP in the last 168 hours. Glucose Recent Labs  Lab 02/10/17 1135 02/10/17 1619 02/10/17 1929 02/10/17 2314 02/11/17 0324 02/11/17 0806  GLUCAP 98 105* 110* 122* 115* 107*   Imaging No results found.   STUDIES:  CTA 12/2- copd, no pe  DISCUSSION: He has been  unable to wean from ventilator  secondary to pain from fractured pelvis and fractured rib & underlying lung disease.  DO NOT RESUSCITATE ,will be a one-way extubation when all family available  ASSESSMENT / PLAN:  Acute respiratory failure  COPD O2 dependent.  Plan DO NOT RESUSCITATE Start SBTs but doubt will liberate Continue bronchodilators Prednisone taper Ongoing GOC discussion with sons  Acute pulmonary edema Atrial fibrillation ventricular  response Paroxysmal atrial fibrillation Hypotension due to meds , doubt sepsis Plan Lasix on hold due to low BP  telemetry  Not an anticoagulation candidate Continuing digoxin NO pressors  Chronically incarcerated incisional hernia : Seen by surgery not a candidate for surgery Protein calorie malnutrition in setting of critical illness Plan Increase TFs to 30/h PPI   Right pelvic fracture and rib fracture with acute on chronic pain, breakthrough on max fent gtt Plan Not an operative candidate.  Strict  nonweightbearing of right lower extremity Ct morphine IR  Tid - has helped to decrease fent requirements  Chronic anemia No evidence of bleeding Plan Trend serial CBC Goal 7 & above Continuing SCDs    FAMILY  - Updates: Spoke with family, no trach/peg, they are conflicted about  withdrawing life support  - Inter-disciplinary family meet or Palliative Care meeting due by: 12/10 , no escalation, no trach/PEG , one way extubation if weans or when sister arrives   DVT prophylaxis: SCDs SUP: PPI Diet: Tube feed Activity: Bedrest Disposition : ICU  The patient is critically ill with multiple organ systems failure and requires high complexity decision making for assessment and support, frequent evaluation and titration of therapies, application of advanced monitoring technologies and extensive interpretation of multiple databases. Critical Care Time devoted to patient care services described in this note independent of APP time is 31 minutes.    Cyril Mourningakesh Briceson Broadwater MD. Tonny BollmanFCCP. [facility] Pager [phone] If no response call [phone]   02/11/2017

## 2017-02-11 NOTE — Progress Notes (Signed)
Daily Progress Note   Patient Name: Neil Terry       Date: 02/11/2017 DOB: 1927/10/28  Age: 83 y.o. MRN#: [id number] Attending Physician: Rush Farmer, MD Primary Care Physician: Patient, No Pcp Per Admit Date: 02/13/2017  Reason for Consultation/Follow-up: Establishing goals of care  Subjective: Patient in bed, intubated. More alert. Following commands. In process of trying to wean from vent. He is tachypneic with RR in upper 30's. Nods yes when asked if he is in pain. Asked RN to administer fentanyl bolus. Noticeable improvement in RR after fentanyl bolus complete.  No family at bedside.  Review of Systems  Unable to perform ROS: Intubated    Length of Stay: 10  Current Medications: Scheduled Meds:  . acetaminophen (TYLENOL) oral liquid 160 mg/5 mL  500 mg Per Tube BID  . chlorhexidine gluconate (MEDLINE KIT)  15 mL Mouth Rinse BID  . cholecalciferol  2,000 Units Oral Daily  . digoxin  0.125 mg Oral Daily  . [START ON 02/12/2017] feeding supplement (VITAL HIGH PROTEIN)  1,000 mL Per Tube Q24H  . fentaNYL (SUBLIMAZE) injection  50 mcg Intravenous Once  . gabapentin  300 mg Oral TID  . insulin aspart  0-9 Units Subcutaneous Q4H  . ipratropium  0.5 mg Nebulization TID  . levalbuterol  0.63 mg Nebulization TID  . LORazepam  1 mg Per Tube BID  . mouth rinse  15 mL Mouth Rinse 10 times per day  . morphine  15 mg Per Tube TID  . pantoprazole sodium  40 mg Per Tube Daily  . polyethylene glycol  17 g Oral Daily  . potassium chloride  10 mEq Oral Daily  . pramipexole  0.25 mg Oral QHS  . sennosides  5 mL Oral BID  . sertraline  50 mg Oral QHS  . tamsulosin  0.4 mg Oral QPC supper    Continuous Infusions: . sodium chloride 10 mL/hr at 02/11/17 0700  . dexmedetomidine  (PRECEDEX) IV infusion 0.4 mcg/kg/hr (02/11/17 0700)  . fentaNYL infusion INTRAVENOUS 200 mcg/hr (02/11/17 0700)    PRN Meds: fentaNYL, fentaNYL (SUBLIMAZE) injection, HYDROmorphone (DILAUDID) injection, midazolam  Physical Exam  Constitutional:  Frail, ill appearing  Cardiovascular: Normal rate and regular rhythm.  Pulmonary/Chest:  Intubated, tachypneaic while weaning  Neurological:  Awake, but lethargic, follows commands  Skin: Skin  is warm and dry.  Nursing note and vitals reviewed.           Vital Signs: BP 96/65   Pulse 85   Temp 98.3 F (36.8 C) (Oral)   Resp 18   Ht '5\' 6"'$  (1.676 m)   Wt 74.1 kg (163 lb 5.8 oz)   SpO2 100%   BMI 26.37 kg/m  SpO2: SpO2: 100 % O2 Device: O2 Device: Ventilator O2 Flow Rate: O2 Flow Rate (L/min): 10 L/min  Intake/output summary:   Intake/Output Summary (Last 24 hours) at 02/11/2017 1011 Last data filed at 02/11/2017 0900 Gross per 24 hour  Intake 1919.83 ml  Output 1765 ml  Net 154.83 ml   LBM: Last BM Date: (PTA) Baseline Weight: Weight: 73.5 kg (162 lb) Most recent weight: Weight: 74.1 kg (163 lb 5.8 oz)       Palliative Assessment/Data: PPS: 10%    Flowsheet Rows     Most Recent Value  Intake Tab  Referral Department  Critical care  Unit at Time of Referral  ICU  Palliative Care Primary Diagnosis  Trauma  Date Notified  02/09/17  Palliative Care Type  New Palliative care  Reason for referral  Clarify Goals of Care  Date of Admission  03/02/2017  Date first seen by Palliative Care  02/10/17  # of days Palliative referral response time  1 Day(s)  # of days IP prior to Palliative referral  9  Clinical Assessment  Palliative Performance Scale Score  10%  Psychosocial & Spiritual Assessment  Palliative Care Outcomes  Patient/Family meeting held?  Yes  Who was at the meeting?  multiple family members including sons  Palliative Care Outcomes  Clarified goals of care, Provided end of life care assistance, Provided  psychosocial or spiritual support      Patient Active Problem List   Diagnosis Date Noted  . Palliative care by specialist   . Goals of care, counseling/discussion   . Closed fracture of both anterior and posterior columns of acetabulum ([place]) 02/05/2017  . Pelvic fracture ([place]) 02/01/2017  . Hip fracture requiring operative repair, right, closed, initial encounter ([place]) 02/01/2017  . H/O: GI bleed 02/01/2017  . Traumatic retroperitoneal hemorrhage 02/01/2017  . Coronary artery calcification seen on CAT scan   . Abnormal nuclear stress test   . Dyspnea on exertion 04/09/2016  . Bowel obstruction ([place]) 04/09/2016  . Abdominal pain   . Hernia, abdominal 01/25/2016  . GERD without esophagitis 01/25/2016  . Incisional hernia   . Chronic anticoagulation 11/24/2014  . PAF (paroxysmal atrial fibrillation) ([place]) 11/10/2014  . Atrial fibrillation with RVR ([place]) 11/10/2014  . Chest pain 11/10/2014  . RUQ pain 11/10/2014  . Renal cyst, right 11/10/2014  . AAA (abdominal aortic aneurysm) without rupture ([place]) 11/10/2014  . Ventral hernia 11/10/2014  . BPH (benign prostatic hyperplasia) 11/10/2014  . Nausea and vomiting 11/09/2014  . CAP (community acquired pneumonia) 11/09/2014  . COPD exacerbation ([place]) 11/09/2014  . Abdominal pain, epigastric 11/09/2014  . Right hip pain 11/08/2014  . Acute urinary retention 11/08/2014  . Acute on chronic respiratory failure with hypoxia ([place]) 11/08/2014  . Fall   . Hypoxia     Palliative Care Assessment & Plan   Patient Profile: 83 y.o. male  with past medical history of COPD on home oxygen, hypertension, GERD, depression, BPH, duodenal ulcer, abdominal hernias with bowel obstruction, and falls admitted on 02/23/2017 with right hip pain after a fall at home. In ED, xray revealed right acetabular  and right iliopubic ring fracture. Shortly after arrival to ED, heart rate increased to 140's with rapid ventricular response and intermittent nonsustained  ventricular tachycardia. Temp up to 100.6 rectally and with tachypnea. Patient intubated on 12/3 and has been unable to wean secondary to pain from pelvic and rib fracture. Changed to limited code on 12/11. Plan is for one-way extubation when all family arrives. Palliative medicine consultation for goals of care/terminal care.     Assessment/Recommendations/Plan   Continue current level of care  [place]- support patient and provide comfort interventions until daughter can arrive from [place]- hopefully by Saturday  No trach, no PEG   Goals of Care and Additional Recommendations:  Limitations on Scope of Treatment: No Tracheostomy  Code Status:  Limited code - patient intubated- no CPR, no ACLS  Prognosis:   Unable to determine  Discharge Planning:  To Be Determined  Care plan was discussed with patient's nurse- Earlie Server.  Thank you for allowing the Palliative Medicine Team to assist in the care of this patient.   Time In: 0935 Time Out: 1010 Total Time 35 mins Prolonged Time Billed no      Greater than 50%  of this time was spent counseling and coordinating care related to the above assessment and plan.  Mariana Kaufman, AGNP-C Palliative Medicine   Please contact Palliative Medicine Team phone at [phone] for questions and concerns.

## 2017-02-12 ENCOUNTER — Inpatient Hospital Stay (HOSPITAL_COMMUNITY): Payer: Medicare Other

## 2017-02-12 DIAGNOSIS — T148XXA Other injury of unspecified body region, initial encounter: Secondary | ICD-10-CM

## 2017-02-12 DIAGNOSIS — G9340 Encephalopathy, unspecified: Secondary | ICD-10-CM

## 2017-02-12 LAB — BASIC METABOLIC PANEL
ANION GAP: 5 (ref 5–15)
BUN: 17 mg/dL (ref 6–20)
CALCIUM: 7.8 mg/dL — AB (ref 8.9–10.3)
CHLORIDE: 105 mmol/L (ref 101–111)
CO2: 29 mmol/L (ref 22–32)
Creatinine, Ser: 0.63 mg/dL (ref 0.61–1.24)
GFR calc Af Amer: >60 mL/min (ref >60)
GFR calc non Af Amer: >60 mL/min (ref >60)
GLUCOSE: 125 mg/dL — AB (ref 65–99)
Potassium: 3.7 mmol/L (ref 3.5–5.1)
Sodium: 139 mmol/L (ref 135–145)

## 2017-02-12 LAB — GLUCOSE, CAPILLARY
Glucose-Capillary: 105 mg/dL — ABNORMAL HIGH (ref 65–99)
Glucose-Capillary: 96 mg/dL (ref 65–99)

## 2017-02-12 LAB — CBC
HEMATOCRIT: 31.9 % — AB (ref 39.0–52.0)
Hemoglobin: 9.6 g/dL — ABNORMAL LOW (ref 13.0–17.0)
MCH: 26.2 pg (ref 26.0–34.0)
MCHC: 30.1 g/dL (ref 30.0–36.0)
MCV: 86.9 fL (ref 78.0–100.0)
Platelets: 302 10*3/uL (ref 150–400)
RBC: 3.67 MIL/uL — ABNORMAL LOW (ref 4.22–5.81)
RDW: 16.1 % — AB (ref 11.5–15.5)
WBC: 14.3 10*3/uL — AB (ref 4.0–10.5)

## 2017-02-12 MED ORDER — MIDAZOLAM HCL 2 MG/2ML IJ SOLN
1.0000 mg | INTRAMUSCULAR | Status: DC | PRN
  Administered 2017-02-12: 2 mg via INTRAVENOUS
  Administered 2017-02-12 (×2): 1 mg via INTRAVENOUS
  Administered 2017-02-13: 2 mg via INTRAVENOUS
  Filled 2017-02-12 (×4): qty 2

## 2017-02-12 MED ORDER — ORAL CARE MOUTH RINSE
15.0000 mL | Freq: Four times a day (QID) | OROMUCOSAL | Status: DC
  Administered 2017-02-12 – 2017-02-20 (×31): 15 mL via OROMUCOSAL

## 2017-02-12 MED ORDER — CHLORHEXIDINE GLUCONATE 0.12% ORAL RINSE (MEDLINE KIT)
15.0000 mL | Freq: Two times a day (BID) | OROMUCOSAL | Status: DC
  Administered 2017-02-14 – 2017-02-20 (×13): 15 mL via OROMUCOSAL

## 2017-02-12 MED ORDER — SODIUM CHLORIDE 0.9 % IV SOLN
1.0000 mg/h | INTRAVENOUS | Status: DC
  Administered 2017-02-12: 2 mg/h via INTRAVENOUS
  Administered 2017-02-13: 20 mg/h via INTRAVENOUS
  Filled 2017-02-12 (×2): qty 10

## 2017-02-12 MED ORDER — MORPHINE BOLUS VIA INFUSION
2.0000 mg | INTRAVENOUS | Status: DC | PRN
  Administered 2017-02-12 – 2017-02-13 (×2): 2 mg via INTRAVENOUS
  Filled 2017-02-12: qty 2

## 2017-02-12 NOTE — Progress Notes (Signed)
Wasted 1mg  of Ativan with Hollie SalkZeinab Hassan, RN

## 2017-02-12 NOTE — Progress Notes (Signed)
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 He has h/o abdominal hernias with a bowel obstruction in November 2017 treated  Conservatively.  Course complicated by atrial fibrillation controlled by amiodarone drip and respiratory failure requiring mechanical ventilation  ETT 12/3 >>  SUBJECTIVE:  Excruciating pain this AM, increased sedation and now much more comfortable.  VITAL SIGNS: BP (!) 78/60   Pulse 79   Temp (!) 96.7 F (35.9 C) (Axillary)   Resp 18   Ht 5\' 6"  (1.676 m)   Wt 76 kg (167 lb 8.8 oz)   SpO2 100%   BMI 27.04 kg/m   VENTILATOR SETTINGS: Vent Mode: PRVC FiO2 (%):  [40 %] 40 % Set Rate:  [18 bmp] 18 bmp Vt Set:  [510 mL] 510 mL PEEP:  [5 cmH20] 5 cmH20 Plateau Pressure:  [19 cmH20-39 cmH20] 39 cmH20  INTAKE / OUTPUT: I/O last 3 completed shifts: In: 2734.1 [I.V.:1294.3; NG/GT:856.5; IV Piggyback:583.3] Out: 1640 [Urine:1640]  PHYSICAL EXAMINATION: General: Elderly male, in severe painful distress HEENT: [place]/AT, PERRL, EOM-I and MMM PSY: Was agitated, now sedate Neuro: Withdraws to pain but now sedate CV: RRR, Nl S1/S2 and -M/R/G. PULM: Coarse BS diffusely GI: Soft, NT, ND and +BS Extremities: W&D, + LE edema. Pain w/ any movement particularly the hip/pelvis   LABS:  BMET Recent Labs  Lab 02/10/17 0259 02/11/17 0254 02/12/17 0230  NA 134* 136 139  K 3.9 3.7 3.7  CL 101 104 105  CO2 27 27 29   BUN 18 22* 17  CREATININE 0.62 0.76 0.63  GLUCOSE 95 122* 125*   Electrolytes Recent Labs  Lab 02/06/17 0236 02/08/17 0222 02/10/17 0259 02/11/17 0254 02/12/17 0230   CALCIUM 8.0* 8.0* 7.8* 7.5* 7.8*  MG 2.2 2.1 1.9  --   --   PHOS 3.0 2.8 2.7  --   --    CBC Recent Labs  Lab 02/10/17 0259 02/11/17 0254 02/12/17 0230  WBC 11.7* 14.4* 14.3*  HGB 10.2* 9.5* 9.6*  HCT 32.7* 31.3* 31.9*  PLT 267 258 302   Coag's No results for input(s): APTT, INR in the last 168 hours. Sepsis Markers No results for input(s): LATICACIDVEN, PROCALCITON, O2SATVEN in the last 168 hours. ABG Recent Labs  Lab 02/06/17 0455  PHART 7.406  PCO2ART 51.8*  PO2ART 76.7*   Liver Enzymes No results for input(s): AST, ALT, ALKPHOS, BILITOT, ALBUMIN in the last 168 hours. Cardiac Enzymes No results for input(s): TROPONINI, PROBNP in the last 168 hours. Glucose Recent Labs  Lab 02/11/17 1303 02/11/17 1622 02/11/17 2017 02/11/17 2346 02/12/17 0335 02/12/17 0736  GLUCAP 131* 115* 112* 120* 105* 96   Imaging Dg Abd Portable 1v  Result Date: 02/12/2017 CLINICAL DATA:  Orogastric tube placement. EXAM: PORTABLE ABDOMEN - 1 VIEW COMPARISON:  Abdominal radiograph performed 02/02/2017, and CT of the abdomen and pelvis from 02/04/2017 FINDINGS: The patient's enteric tube is noted ending overlying the body of the stomach. The visualized bowel gas pattern is unremarkable. Scattered air and stool filled loops of colon are seen; no abnormal dilatation of small bowel loops is seen to suggest small bowel obstruction. No free intra-abdominal air is  identified, though evaluation for free air is limited on a single supine view. The patient is status post vertebroplasty at the upper lumbar spine; the sacroiliac joints are unremarkable in appearance. Small bilateral pleural effusions are noted. IMPRESSION: 1. Enteric tube noted ending overlying the body of the stomach. 2. Unremarkable bowel gas pattern; no free intra-abdominal air seen. 3. Small bilateral pleural effusions seen. Electronically Signed   By: Roanna RaiderJeffery  Chang M.D.   On: 02/12/2017 05:02     STUDIES:  CTA 12/2- copd, no  pe  DISCUSSION: He has been  unable to wean from ventilator  secondary to pain from fractured pelvis and fractured rib & underlying lung disease.  DO NOT RESUSCITATE ,will be a one-way extubation when all family available  ASSESSMENT / PLAN:  Acute respiratory failure  COPD O2 dependent.  Plan Partial code, no CPR/cardioversion No weaning Continue bronchodilators Prednisone taper Family awaiting the arrival of a daughter from [place], plan withdrawal on Saturday.  Acute pulmonary edema Atrial fibrillation ventricular response Paroxysmal atrial fibrillation Hypotension due to meds , doubt sepsis Plan D/C diuretics Tele monitoring but LCB with no CPR/cardioversion. Not an anticoagulation candidate D/C digoxin NO pressors  Chronically incarcerated incisional hernia : Seen by surgery not a candidate for surgery Protein calorie malnutrition in setting of critical illness Plan TF per nutrition PPI  Right pelvic fracture and rib fracture with acute on chronic pain, breakthrough on max fent gtt Plan Not an operative candidate.  Strict nonweightbearing of right lower extremity Ct morphine IR  Tid - has helped to decrease fent requirements  Chronic anemia No evidence of bleeding Plan Trend serial CBC Goal 7 & above Continuing SCDs  FAMILY  - Updates: No family bedside today, need to determine plan of care.  - Inter- Awaiting on family for withdrawal, likely Saturday.  The patient is critically ill with multiple organ systems failure and requires high complexity decision making for assessment and support, frequent evaluation and titration of therapies, application of advanced monitoring technologies and extensive interpretation of multiple databases.   Critical Care Time devoted to patient care services described in this note is  35  Minutes. This time reflects time of care of this signee Dr Koren BoundWesam Yacoub. This critical care time does not reflect procedure time, or teaching  time or supervisory time of PA/NP/Med student/Med Resident etc but could involve care discussion time.  Alyson ReedyWesam G. Yacoub, M.D. [facility] Pulmonary/Critical Care Medicine. Pager: [phone]. After hours pager: [phone].  02/12/2017

## 2017-02-12 NOTE — Progress Notes (Signed)
Daily Progress Note   Patient Name: Neil [place]       Date: 02/12/2017 DOB: 1928-02-14  Age: 83 y.o. MRN#: [id number] Attending Physician: Rush Farmer, MD Primary Care Physician: Patient, No Pcp Per Admit Date: 02/08/2017  Reason for Consultation/Follow-up: Establishing goals of care  Subjective: Patient in bed, intubated. Son and son in law at bedside.  Daughter's visa has been postponed until Sunday and they are hopeful she will arrive on Monday. Plan is to continue current level of care until she is able to arrive.  Patient aroused to son's voice. He denied pain. Pain med and sedation titration per critical care team.   Review of Systems  Unable to perform ROS: Intubated    Length of Stay: 11  Current Medications: Scheduled Meds:  . acetaminophen (TYLENOL) oral liquid 160 mg/5 mL  500 mg Per Tube BID  . chlorhexidine gluconate (MEDLINE KIT)  15 mL Mouth Rinse BID  . cholecalciferol  2,000 Units Oral Daily  . feeding supplement (VITAL HIGH PROTEIN)  1,000 mL Per Tube Q24H  . ipratropium  0.5 mg Nebulization TID  . levalbuterol  0.63 mg Nebulization TID  . LORazepam  1 mg Per Tube BID  . mouth rinse  15 mL Mouth Rinse QID  . pantoprazole sodium  40 mg Per Tube Daily  . polyethylene glycol  17 g Oral Daily  . potassium chloride  10 mEq Oral Daily  . sennosides  5 mL Oral BID    Continuous Infusions: . sodium chloride 10 mL/hr at 02/11/17 2100  . morphine 4 mg/hr (02/12/17 1151)    PRN Meds: HYDROmorphone (DILAUDID) injection, midazolam  Physical Exam  Constitutional:  Frail, ill appearing  Cardiovascular: Normal rate and regular rhythm.  Pulmonary/Chest:  Intubated, tachypneaic while weaning  Neurological:  Awake, but lethargic, follows commands  Skin: Skin  is warm and dry.  Nursing note and vitals reviewed.           Vital Signs: BP (!) 98/59 (BP Location: Left Arm)   Pulse 90   Temp (!) 96.7 F (35.9 C) (Axillary)   Resp 19   Ht '5\' 6"'$  (1.676 m)   Wt 76 kg (167 lb 8.8 oz)   SpO2 100%   BMI 27.04 kg/m  SpO2: SpO2: 100 % O2 Device: O2 Device: Ventilator O2  Flow Rate: O2 Flow Rate (L/min): 10 L/min  Intake/output summary:   Intake/Output Summary (Last 24 hours) at 02/12/2017 1311 Last data filed at 02/12/2017 1200 Gross per 24 hour  Intake 1300.74 ml  Output 1205 ml  Net 95.74 ml   LBM: Last BM Date: (PTA) Baseline Weight: Weight: 73.5 kg (162 lb) Most recent weight: Weight: 76 kg (167 lb 8.8 oz)       Palliative Assessment/Data: PPS: 10%    Flowsheet Rows     Most Recent Value  Intake Tab  Referral Department  Critical care  Unit at Time of Referral  ICU  Palliative Care Primary Diagnosis  Trauma  Date Notified  02/09/17  Palliative Care Type  New Palliative care  Reason for referral  Clarify Goals of Care  Date of Admission  02/24/2017  Date first seen by Palliative Care  02/10/17  # of days Palliative referral response time  1 Day(s)  # of days IP prior to Palliative referral  9  Clinical Assessment  Palliative Performance Scale Score  10%  Psychosocial & Spiritual Assessment  Palliative Care Outcomes  Patient/Family meeting held?  Yes  Who was at the meeting?  multiple family members including sons  Palliative Care Outcomes  Clarified goals of care, Provided end of life care assistance, Provided psychosocial or spiritual support      Patient Active Problem List   Diagnosis Date Noted  . Advance care planning   . Palliative care by specialist   . Goals of care, counseling/discussion   . Closed fracture of both anterior and posterior columns of acetabulum ([place]) 02/05/2017  . Pelvic fracture ([place]) 02/01/2017  . Hip fracture requiring operative repair, right, closed, initial encounter ([place]) 02/01/2017  .  H/O: GI bleed 02/01/2017  . Traumatic retroperitoneal hemorrhage 02/01/2017  . Coronary artery calcification seen on CAT scan   . Abnormal nuclear stress test   . Dyspnea on exertion 04/09/2016  . Bowel obstruction ([place]) 04/09/2016  . Abdominal pain   . Hernia, abdominal 01/25/2016  . GERD without esophagitis 01/25/2016  . Incisional hernia   . Chronic anticoagulation 11/24/2014  . PAF (paroxysmal atrial fibrillation) ([place]) 11/10/2014  . Atrial fibrillation with RVR ([place]) 11/10/2014  . Chest pain 11/10/2014  . RUQ pain 11/10/2014  . Renal cyst, right 11/10/2014  . AAA (abdominal aortic aneurysm) without rupture ([place]) 11/10/2014  . Ventral hernia 11/10/2014  . BPH (benign prostatic hyperplasia) 11/10/2014  . Nausea and vomiting 11/09/2014  . CAP (community acquired pneumonia) 11/09/2014  . COPD exacerbation ([place]) 11/09/2014  . Abdominal pain, epigastric 11/09/2014  . Right hip pain 11/08/2014  . Acute urinary retention 11/08/2014  . Acute on chronic respiratory failure with hypoxia ([place]) 11/08/2014  . Fall   . Hypoxia     Palliative Care Assessment & Plan   Patient Profile: 83 y.o. male  with past medical history of COPD on home oxygen, hypertension, GERD, depression, BPH, duodenal ulcer, abdominal hernias with bowel obstruction, and falls admitted on 02/10/2017 with right hip pain after a fall at home. In ED, xray revealed right acetabular and right iliopubic ring fracture. Shortly after arrival to ED, heart rate increased to 140's with rapid ventricular response and intermittent nonsustained ventricular tachycardia. Temp up to 100.6 rectally and with tachypnea. Patient intubated on 12/3 and has been unable to wean secondary to pain from pelvic and rib fracture. Changed to limited code on 12/11. Plan is for one-way extubation when all family arrives. Palliative medicine consultation for goals  of care/terminal care.    Assessment/Recommendations/Plan   Continue current level of  care  [place]- support patient and provide comfort interventions until daughter can arrive from [place]- hopefully by Monday  No trach, no PEG   Goals of Care and Additional Recommendations:  Limitations on Scope of Treatment: No Tracheostomy  Code Status:  Limited code - patient intubated- no CPR, no ACLS  Prognosis:   Unable to determine  Discharge Planning:  To Be Determined  Care plan was discussed with patient family.  Thank you for allowing the Palliative Medicine Team to assist in the care of this patient.   Time In: 1030 Time Out: 1100 Total Time 30 mins Prolonged Time Billed no      Greater than 50%  of this time was spent counseling and coordinating care related to the above assessment and plan.  Mariana Kaufman, AGNP-C Palliative Medicine   Please contact Palliative Medicine Team phone at [phone] for questions and concerns.

## 2017-02-12 NOTE — Progress Notes (Signed)
125 ml Fentanyl gtt wasted in sink with Stefani DamaKristina Kelley, RN.

## 2017-02-13 LAB — GLUCOSE, CAPILLARY: Glucose-Capillary: 75 mg/dL (ref 65–99)

## 2017-02-13 MED ORDER — LORAZEPAM 2 MG/ML IJ SOLN: 1.0000 mg | INTRAMUSCULAR | Status: DC | PRN

## 2017-02-13 MED ORDER — SODIUM CHLORIDE 0.9 % IV SOLN
4.0000 mg/h | INTRAVENOUS | Status: DC
  Administered 2017-02-13 – 2017-02-14 (×2): 4 mg/h via INTRAVENOUS
  Administered 2017-02-14 – 2017-02-16 (×5): 5 mg/h via INTRAVENOUS
  Administered 2017-02-16 – 2017-02-17 (×5): 6 mg/h via INTRAVENOUS
  Administered 2017-02-18: 8 mg/h via INTRAVENOUS
  Administered 2017-02-18: 6 mg/h via INTRAVENOUS
  Administered 2017-02-19 – 2017-02-20 (×3): 4 mg/h via INTRAVENOUS
  Administered 2017-02-20: 6 mg/h via INTRAVENOUS
  Administered 2017-02-20: 8 mg/h via INTRAVENOUS
  Filled 2017-02-13 (×19): qty 5

## 2017-02-13 MED ORDER — LORAZEPAM 2 MG/ML IJ SOLN
1.0000 mg | INTRAMUSCULAR | Status: DC
  Administered 2017-02-13 – 2017-02-16 (×17): 1 mg via INTRAVENOUS
  Filled 2017-02-13 (×17): qty 1

## 2017-02-13 MED ORDER — ACETAMINOPHEN 160 MG/5ML PO SOLN: 650.0000 mg | Freq: Four times a day (QID) | ORAL | Status: DC | PRN

## 2017-02-13 MED ORDER — HYDROMORPHONE BOLUS VIA INFUSION
2.0000 mg | INTRAVENOUS | Status: DC | PRN
  Administered 2017-02-14 – 2017-02-20 (×8): 2 mg via INTRAVENOUS
  Filled 2017-02-13: qty 2

## 2017-02-13 MED ORDER — ACETAMINOPHEN 650 MG RE SUPP
650.0000 mg | Freq: Four times a day (QID) | RECTAL | Status: DC | PRN
  Administered 2017-02-15 – 2017-02-16 (×2): 650 mg via RECTAL
  Filled 2017-02-13 (×2): qty 1

## 2017-02-13 NOTE — Progress Notes (Signed)
Rt called to pt's room due to desat.  Pt placed on 100% FIO2, sat improved to 91%.  MD notified.

## 2017-02-13 NOTE — Progress Notes (Signed)
Palliative Medicine RN Note: Arrived for symptom check. No family present. Patient is red faced, coughing during suctioning. RN gave prn morphine (on 20 mg continuous, got a 2mg  bolus). Called Dr Phillips OdorGolding. She reviewed meds with me and adjusted regimen. Patient still has orders for multiple meds via tube, so these were stopped, as the patient no longer has an NG or OG tube. Pt was transitioned to dilaudid due to high doses of morphine. Orders were started conservatively, as pt is not full comfort care.   PMT will follow for symptoms. We await family's arrival on Monday.  Neil ChanceMelanie G. Yaritzel Stange, RN, BSN, [facility] 02/13/2017 2:06 PM Cell [phone] 8:00-4:00 Monday-Friday Office [phone]

## 2017-02-13 NOTE — Progress Notes (Deleted)
Spoke with Spring Kirk, phlebotomist this am in regards to pending heparin lab draw per pharmacist.  Will continue to monitor.   

## 2017-02-13 NOTE — Care Management Note (Signed)
Case Management Note Previous Note Created by Letha Capeeborah Taylor  Patient Details  Name: Neil Terry Tsan MRN: [id number] Date of Birth: 1927/07/20  Subjective/Objective:  From home with son, [name]. NCM spoke with patient's son [name], he would like for patient to be able to go [facility] , [place] [facility] looking to see if patient is a candidate.  On 12/4 , she states he will need two more days in ICU.  Will discuss patient in  LOS for LTACH.   12/6 1541 Letha Capeeborah Taylor RN, BSN - Discussed in LOS, [facility] feels [facility] when stable enough. [name] with [facility] following for LTACH.  12/7 1520 Letha Capeeborah Taylor RN, BSN-He is unable to wean from ventilator at this time secondary to pain from fractured pelvis and fractured rib.  He will most likely the need end-of-life discussions or tracheostomy to be liberated from mechanical ventilatory support in a more leisurely rate per PCCM note.  Per family does not want trach or peg, will terminally extubate when family is ready, likely next week.                     Action/Plan: NCM will follow for dc needs.   Expected Discharge Date:                  Expected Discharge Plan:  Long Term Acute Care (LTAC)  In-House Referral:     Discharge planning Services  CM Consult  Post Acute Care Choice:    Choice offered to:     DME Arranged:    DME Agency:     HH Arranged:    HH Agency:     Status of Service:  In process, will continue to follow  If discussed at Long Length of Stay Meetings, dates discussed:    Additional Comments: 02/13/2017  Pt transitioning to full comfort care   02/09/17 GOC discussions ongoing.  Pt has very poor prognosis and recommendation is for withdrawal of care [name], Kerrie Timm S, RN 02/13/2017, 3:48 PM

## 2017-02-13 NOTE — Progress Notes (Signed)
PCCM PROGRESS NOTE  Admission date: 02/21/2017  CC: Fall  Summary: 83 yo male had fall at home.  Found to have Rt acetabular and pelvic fracture.  Orthopedics consulted, and decided for non operative management given age and comorbid conditions.  He developed A fib with RVR and cardiology consulted.  He developed progressive hypoxia with concern for aspiration pneumonia.  He was transferred to ICU and required intubation.  General surgery was consulted to assess incisional hernia, and no surgical intervention was needed.  He continued to have increased oxygenation needs and not able to be weaned from vent.  Family decided against additional aggressive measures.  DNR established.  Awaiting family to arrive from [place], and then likely transition to comfort care.  Subjective: Remains on vent  Vital signs: BP (!) 77/60   Pulse 76   Temp (!) 96.7 F (35.9 C) (Axillary)   Resp 18   Ht 5\' 6"  (1.676 m)   Wt 167 lb 8.8 oz (76 kg)   SpO2 99%   BMI 27.04 kg/m   Intake/output: I/O last 3 completed shifts: In: 1239.9 [I.V.:819.9; NG/GT:420] Out: 1650 [Urine:1650]  Physical Exam: General - sedated Eyes - pupils pinpoint ENT - ETT in place Cardiac - regular, no murmur Chest - no wheeze, rales Abd - soft, non tender Ext - no edema Skin - no rashes Neuro - [name] -3  CMP Latest Ref Rng & Units 02/12/2017 02/11/2017 02/10/2017  Glucose 65 - 99 mg/dL 161(W125(H) 960(A122(H) 95  BUN 6 - 20 mg/dL 17 54(U22(H) 18  Creatinine 0.61 - 1.24 mg/dL 9.810.63 1.910.76 4.780.62  Sodium 135 - 145 mmol/L 139 136 134(L)  Potassium 3.5 - 5.1 mmol/L 3.7 3.7 3.9  Chloride 101 - 111 mmol/L 105 104 101  CO2 22 - 32 mmol/L 29 27 27   Calcium 8.9 - 10.3 mg/dL 7.8(L) 7.5(L) 7.8(L)  Total Protein 6.5 - 8.1 g/dL - - -  Total Bilirubin 0.3 - 1.2 mg/dL - - -  Alkaline Phos 38 - 126 U/L - - -  AST 15 - 41 U/L - - -  ALT 17 - 63 U/L - - -    CBC Latest Ref Rng & Units 02/12/2017 02/11/2017 02/10/2017  WBC 4.0 - 10.5 K/uL 14.3(H)  14.4(H) 11.7(H)  Hemoglobin 13.0 - 17.0 g/dL 2.9(F9.6(L) 6.2(Z9.5(L) 10.2(L)  Hematocrit 39.0 - 52.0 % 31.9(L) 31.3(L) 32.7(L)  Platelets 150 - 400 K/uL 302 258 267    Assessment: Acute hypoxic respiratory failure Aspiration pneumonia Comminuted displaced fracture Rt iliac wing Non displaced Rt inferior pubic ramus fracture Extraperitoneal hemorrhage from pelvic fracture COPD A fib with RVR Hx of Depression Hx of duodenal ulcer Hx of hypertension Incisional hernia  Plan: Continue full vent support Adjust PEEP/FiO2 to keep SpO2 > 88% Defer additional lab testing or imaging studies Scheduled BDs Continue Ativan Morphine gtt for comfort  Updated pt's family at bedside.  Explained that he might not be able to sustain his oxygenation long enough to allow family to arrive from [place].  Coralyn HellingVineet Danee Soller, MD [facility]/Critical [facility] 02/13/2017, 9:10 AM Pager:  [phone] After 3pm call: [phone]

## 2017-02-13 NOTE — Progress Notes (Signed)
Morphine 15mL wasted in sink, witnessed by Dan MakerJames Dunlop, RN.

## 2017-02-14 DIAGNOSIS — L8990 Pressure ulcer of unspecified site, unspecified stage: Secondary | ICD-10-CM

## 2017-02-14 NOTE — Progress Notes (Signed)
Daily Progress Note   Patient Name: Neil Terry       Date: 02/14/2017 DOB: [birth year]  Age: 83 y.o. MRN#: [id number] Attending Physician: Rush Farmer, MD Primary Care Physician: Patient, No Pcp Per Admit Date: 02/11/2017  Reason for Consultation/Follow-up: Non pain symptom management and Pain control  Subjective: Patient started on a Dilaudid drip on 02/13/2017.  Per nursing, patient appears much more comfortable.  Patient seen, no nonverbal signs and symptoms of pain, agitation.  Dilaudid drip at 5 mg an hour.  Minimal boluses utilized.  RN reports boluses primarily used as premedication for turning, ADLs  Family at the bedside feels he looks comfortable.  We are maintaining life support until daughter can arrive from [place] which should be Sunday or Monday for one-way extubation  Length of Stay: 13  Current Medications: Scheduled Meds:  . chlorhexidine gluconate (MEDLINE KIT)  15 mL Mouth Rinse BID  . ipratropium  0.5 mg Nebulization TID  . levalbuterol  0.63 mg Nebulization TID  . LORazepam  1 mg Intravenous Q4H  . mouth rinse  15 mL Mouth Rinse QID    Continuous Infusions: . sodium chloride 10 mL/hr at 02/14/17 0700  . HYDROmorphone 5 mg/hr (02/14/17 1249)    PRN Meds: acetaminophen, HYDROmorphone, LORazepam  Physical Exam  Constitutional: He appears well-developed.  Acutely ill-appearing elderly man; intubated, unresponsive  Pulmonary/Chest:  Intubated  Genitourinary:  Genitourinary Comments: Foley  Neurological:  Unresponsive  Skin: Skin is warm and dry.  Psychiatric:  Unable to test No agitation  Nursing note and vitals reviewed.           Vital Signs: BP (!) 91/57   Pulse 84   Temp 99.7 F (37.6 C) (Oral)   Resp 18   Ht 5' 6" (1.676 m)   Wt 74 kg  (163 lb 2.3 oz)   SpO2 97%   BMI 26.33 kg/m  SpO2: SpO2: 97 % O2 Device: O2 Device: Ventilator O2 Flow Rate: O2 Flow Rate (L/min): 10 L/min  Intake/output summary:   Intake/Output Summary (Last 24 hours) at 02/14/2017 1550 Last data filed at 02/14/2017 1500 Gross per 24 hour  Intake 445.1 ml  Output 515 ml  Net -69.9 ml   LBM: Last BM Date: 02/13/17 Baseline Weight: Weight: 73.5 kg (162 lb) Most recent weight: Weight: 74 kg (  163 lb 2.3 oz)       Palliative Assessment/Data:    Flowsheet Rows     Most Recent Value  Intake Tab  Referral Department  Critical care  Unit at Time of Referral  ICU  Palliative Care Primary Diagnosis  Trauma  Date Notified  02/09/17  Palliative Care Type  New Palliative care  Reason for referral  Clarify Goals of Care  Date of Admission  02/15/2017  Date first seen by Palliative Care  02/10/17  # of days Palliative referral response time  1 Day(s)  # of days IP prior to Palliative referral  9  Clinical Assessment  Palliative Performance Scale Score  10%  Psychosocial & Spiritual Assessment  Palliative Care Outcomes  Patient/Family meeting held?  Yes  Who was at the meeting?  multiple family members including sons  Palliative Care Outcomes  Clarified goals of care, Provided end of life care assistance, Provided psychosocial or spiritual support      Patient Active Problem List   Diagnosis Date Noted  . Pressure injury of skin 02/14/2017  . Fracture   . Advance care planning   . Palliative care by specialist   . Goals of care, counseling/discussion   . Closed fracture of both anterior and posterior columns of acetabulum ([place]) 02/05/2017  . Pelvic fracture ([place]) 02/01/2017  . Hip fracture requiring operative repair, right, closed, initial encounter ([place]) 02/01/2017  . H/O: GI bleed 02/01/2017  . Traumatic retroperitoneal hemorrhage 02/01/2017  . Coronary artery calcification seen on CAT scan   . Abnormal nuclear stress test   . Dyspnea  on exertion 04/09/2016  . Bowel obstruction ([place]) 04/09/2016  . Abdominal pain   . Hernia, abdominal 01/25/2016  . GERD without esophagitis 01/25/2016  . Incisional hernia   . Chronic anticoagulation 11/24/2014  . PAF (paroxysmal atrial fibrillation) ([place]) 11/10/2014  . Atrial fibrillation with RVR ([place]) 11/10/2014  . Chest pain 11/10/2014  . RUQ pain 11/10/2014  . Renal cyst, right 11/10/2014  . AAA (abdominal aortic aneurysm) without rupture ([place]) 11/10/2014  . Ventral hernia 11/10/2014  . BPH (benign prostatic hyperplasia) 11/10/2014  . Nausea and vomiting 11/09/2014  . CAP (community acquired pneumonia) 11/09/2014  . COPD exacerbation ([place]) 11/09/2014  . Abdominal pain, epigastric 11/09/2014  . Right hip pain 11/08/2014  . Acute urinary retention 11/08/2014  . Acute on chronic respiratory failure with hypoxia ([place]) 11/08/2014  . Fall   . Hypoxia     Palliative Care Assessment & Plan   Patient Profile: 83 y.o.malewith past medical history of COPD on home oxygen, hypertension, GERD, depression, BPH, duodenal ulcer, abdominal hernias with bowel obstruction, and fallsadmitted on 12/1/2018with right hip pain after a fall at home.In ED, xray revealed right acetabular and right iliopubic ring fracture. Shortly after arrival to ED, heart rate increased to 140's with rapid ventricular response and intermittent nonsustained ventricular tachycardia. Temp up to 100.6 rectally and with tachypnea. Patient intubated on 12/3 and has been unable to wean secondary to pain from pelvic and rib fracture. Changed to limited code on 12/11. Plan is for one-way extubation when all family arrives.   Palliative medicine consultation for goals of care/terminal care.     Assessment: Patient seen, chart reviewed.  Discussed clinical condition with bedside RN.  Patient now on Dilaudid drip and more comfortable.  He is currently at 5 mg an hour with boluses required for ADLs, premedication for  turning.  No myoclonus observed, no agitation  Recommendations/Plan:  Pain: Improving.  Continue hydromorphone infusion at 5 mg an hour and 2 mg every 30 minutes as needed for pain or shortness of breath  Agitation: Continue scheduled Ativan, 1 mg every 4 hours and 1 mg every 2 hours as needed for anxiety or agitation   Code Status:    Code Status Orders  (From admission, onward)        Start     Ordered   02/10/17 1522  Limited resuscitation (code)  Continuous    Question Answer Comment  In the event of cardiac or respiratory ARREST: Initiate Code Blue, Call Rapid Response No   In the event of cardiac or respiratory ARREST: Perform CPR No   In the event of cardiac or respiratory ARREST: Perform Intubation/Mechanical Ventilation Yes   In the event of cardiac or respiratory ARREST: Use NIPPV/BiPAp only if indicated Yes   In the event of cardiac or respiratory ARREST: Administer ACLS medications if indicated No   In the event of cardiac or respiratory ARREST: Perform Defibrillation or Cardioversion if indicated No      02/10/17 1521    Code Status History    Date Active Date Inactive Code Status Order ID Comments User Context   02/01/2017 08:18 02/10/2017 15:21 Full Code [id number]  Lady Deutscher, MD Inpatient   01/25/2016 16:48 01/30/2016 19:08 Full Code [id number]  Waldemar Dickens, MD ED   11/08/2014 01:38 11/12/2014 21:50 Full Code [id number]  Etta Quill, DO ED       Prognosis:   < 2 weeks of hip fracture and subsequent A. fib with RVR, respiratory failure.  Patient is currently intubated, plan for one-way extubation when patient's daughter arrives from [place] either on February 15, 2017 or February 16, 2017  Discharge Planning:  Anticipated Hospital Death    Thank you for allowing the Palliative Medicine Team to assist in the care of this patient.   Time In: 1530 Time Out: 1555 Total Time 71mn Prolonged Time Billed  no       Greater than 50%  of this time  was spent counseling and coordinating care related to the above assessment and plan.  SDory Horn NP  Please contact Palliative Medicine Team phone at [phone] questions and concerns.

## 2017-02-14 NOTE — Progress Notes (Addendum)
CRITICAL CARE PROGRESS NOTE  Admission date: 02/16/2017  CC: Fall  Summary: 83 yo male had fall at home.  Found to have Rt acetabular and pelvic fracture.  Orthopedics consulted, and decided for non operative management given age and comorbid conditions.  He developed A fib with RVR and cardiology consulted.  He developed progressive hypoxia with concern for aspiration pneumonia.  He was transferred to ICU and required intubation.  General surgery was consulted to assess incisional hernia, and no surgical intervention was needed.  He continued to have increased oxygenation needs and not able to be weaned from vent.  Family decided against additional aggressive measures.  DNR.    New Events   Sedated, no purposeful response  Awaiting family to arrive from [place], and then likely transition to comfort care.    Past Medical History:  Diagnosis Date  . BPH (benign prostatic hypertrophy)   . Bronchial asthma   . COPD (chronic obstructive pulmonary disease) ([place])   . Depression    hx  . Duodenal ulcer    perforated ~ 2013  . GERD (gastroesophageal reflux disease)   . HTN (hypertension)   . On home oxygen therapy    "8h/day; don't know how many liters"     Current Facility-Administered Medications:  .  0.9 %  sodium chloride infusion, , Intravenous, Continuous, Raylene Miyamoto, MD, Last Rate: 10 mL/hr at 02/14/17 0700 .  acetaminophen (TYLENOL) suppository 650 mg, 650 mg, Rectal, Q6H PRN, Lane Hacker L, DO .  chlorhexidine gluconate (MEDLINE KIT) (PERIDEX) 0.12 % solution 15 mL, 15 mL, Mouth Rinse, BID, Rush Farmer, MD, 15 mL at 02/14/17 0825 .  HYDROmorphone (DILAUDID) 50 mg in sodium chloride 0.9 % 100 mL (0.5 mg/mL) infusion, 4-8 mg/hr, Intravenous, Continuous, Acquanetta Chain, DO, Last Rate: 10 mL/hr at 02/14/17 1249, 5 mg/hr at 02/14/17 1249 .  HYDROmorphone (DILAUDID) bolus via infusion 2 mg, 2 mg, Intravenous, Q30 min PRN, Lane Hacker L, DO .   ipratropium (ATROVENT) nebulizer solution 0.5 mg, 0.5 mg, Nebulization, TID, Evangeline Gula Wyatt Haste, MD, 0.5 mg at 02/14/17 0743 .  levalbuterol (XOPENEX) nebulizer solution 0.63 mg, 0.63 mg, Nebulization, TID, Evangeline Gula Wyatt Haste, MD, 0.63 mg at 02/14/17 0744 .  LORazepam (ATIVAN) injection 1 mg, 1 mg, Intravenous, Q4H, Golding, Elizabeth L, DO, 1 mg at 02/14/17 1047 .  LORazepam (ATIVAN) injection 1 mg, 1 mg, Intravenous, Q2H PRN, Lane Hacker L, DO .  MEDLINE mouth rinse, 15 mL, Mouth Rinse, QID, Rush Farmer, MD, 15 mL at 02/14/17 1208  PHYSICAL EXAM:    NAD, on vent Blood pressure 95/60, pulse 80, temperature 99.7 F (37.6 C), temperature source Oral, resp. rate 18, height '5\' 6"'$  (1.676 m), weight 163 lb 2.3 oz (74 kg), SpO2 97 %. Eyes: Pupils equal, Neck: No neck swelling CVS:  -s1 s2 regular Resp:Breath sounds equal; no rales/rhonchi Abdomen:abd-soft, non tender,    BS+ Hernias noted Extremities-no cyanosis Neuro:sedated, Tone  Normal Skin: no rash Integumentary: No clubbing   Ventilator  Fi02- 55%, Peep- 10, Rate- 18, Vt-  546m Pip- 40  ;No AutoPEEP     LABS:   Results for YIRVEN, INGALSBE(MRN [id number] as of 02/14/2017 13:11  Ref. Range 02/06/2017 04:55  pH, Arterial Latest Ref Range: 7.350 - 7.450  7.406  pCO2 arterial Latest Ref Range: 32.0 - 48.0 mmHg 51.8 (H)  pO2, Arterial Latest Ref Range: 83.0 - 108.0 mmHg 76.7 (L)  Acid-Base Excess Latest Ref Range: 0.0 - 2.0 mmol/L 7.2 (  H)  Bicarbonate Latest Ref Range: 20.0 - 28.0 mmol/L 31.9 (H)   Results for MALIQ, PILLEY (MRN [id number]) as of 02/14/2017 13:11  Ref. Range 02/12/2017 02:30  WBC Latest Ref Range: 4.0 - 10.5 K/uL 14.3 (H)  RBC Latest Ref Range: 4.22 - 5.81 MIL/uL 3.67 (L)  Hemoglobin Latest Ref Range: 13.0 - 17.0 g/dL 9.6 (L)  HCT Latest Ref Range: 39.0 - 52.0 % 31.9 (L)  MCV Latest Ref Range: 78.0 - 100.0 fL 86.9  MCH Latest Ref Range: 26.0 - 34.0 pg 26.2  MCHC Latest Ref Range: 30.0 - 36.0 g/dL 30.1   RDW Latest Ref Range: 11.5 - 15.5 % 16.1 (H)  Platelets Latest Ref Range: 150 - 400 K/uL 302   Results for DARUIS, SWAIM (MRN [id number]) as of 02/14/2017 13:11  Ref. Range 02/12/2017 02:30  Sodium Latest Ref Range: 135 - 145 mmol/L 139  Potassium Latest Ref Range: 3.5 - 5.1 mmol/L 3.7  Chloride Latest Ref Range: 101 - 111 mmol/L 105  CO2 Latest Ref Range: 22 - 32 mmol/L 29  Glucose Latest Ref Range: 65 - 99 mg/dL 125 (H)  BUN Latest Ref Range: 6 - 20 mg/dL 17  Creatinine Latest Ref Range: 0.61 - 1.24 mg/dL 0.63  Calcium Latest Ref Range: 8.9 - 10.3 mg/dL 7.8 (L)  Anion gap Latest Ref Range: 5 - 15  5    CHEST X-RAY:  12/11- bilateral basal atelectases/pleural effusions ETT, G tube- good position    Ekg12/3- sinus-rythm   12/6 echo Left ventricle: Systolic function was normal. The estimated   ejection fraction was in the range of 55% to 60%. Wall motion was   normal; there were no regional wall motion abnormalities. Left   ventricular diastolic function parameters were normal. - Left atrium: The atrium was mildly dilated. - Pulmonary arteries: Systolic pressure was moderately increased.   PA peak pressure: 51 mm Hg     12/5 CT C,A,P 1. Bilateral lower lobe pneumonia with small pleural effusions has developed since 02/01/2017, more extensive on the left. 2. Superimposed developing bronchopneumonia suspected in the left lingula and upper lobe. 3. Complex, comminuted right hemipelvis fracture with intraosseous hematoma, and a mild to moderate volume of iliopsoas and retroperitoneal hematoma which has increased since 02/01/2017. Only mild mass effect on abdominal and pelvic viscera at this time. 4. Complex ventral abdominal hernia, the cephalad extent of which appears easily reducible while the caudal extent is more pedunculated and contains small bowel. No evidence of incarceration at this time. No bowel obstruction suspected. 5. Acute nondisplaced left anterior third rib  fracture. Numerous superimposed bilateral subacute and/or chronic rib fractures. Widespread non-acute spinal compression fractures. 6. Dilated gallbladder and mild periportal edema. But no strong CT evidence of acute cholecystitis. 7. ET tube and enteric tube in place. The enteric tube tip terminates at a gastrojejunostomy. 8. Metallic highly dense ingested foreign body located dependently in the stomach remnant. Perhaps this is a piece of dental amalgam. 9. Cardiomegaly. Ectatic abdominal aorta at risk for aneurysm development.    Assessment- Plan:  Acute hypoxic respiratory failure Aspiration pneumonia Comminuted displaced fracture Rt iliac wing Non displaced Rt inferior pubic ramus fracture Extraperitoneal hemorrhage from pelvic fracture COPD A fib with RVR Hx of Depression Hx of duodenal ulcer Hx of hypertension Incisional hernia  Plan: Continue full vent support  PEEP/FiO2 to keep SpO2 > 88% Hold off  additional lab testing or imaging studies  Continue Ativan dilaudid for comfort  Family were told in the  past  that he might not be able to sustain his oxygenation long enough to allow family to arrive from [place].    Prognosis poor  Thank you for letting me participate in the care of your patient.  ^^^^^^^^^^ I  Have personally spent   50  Minutes  In the care of this Patient providing Critical care Services; Time includes review of chart, labs, imaging, coordinating care with other physicians and healthcare team members. Also includes time for frequent reevaluation and additional treatment implementation due to change in clinical condiiton of patient. Excludes time spent for Procedure and Teaching.   ^^^^^^^^^^  Note subject to typographical and grammatical errors;   Any formal questions or concerns about the content, text, or information contained within the body of this dictation should be directly addressed to the physician  for  clarification.   Evans Lance, MD Pulmonary and [place] Pager: [phone]

## 2017-02-15 NOTE — Progress Notes (Signed)
CRITICAL CARE PROGRESS NOTE   Patient Summary:       Admission date:02/27/2017  BP:ZWCH  Summary: 83 yo male had fall at home. Found to have Rt acetabular and pelvic fracture. Orthopedics consulted, and decided for non operative management given age and comorbid conditions. He developed A fib with RVR and cardiology consulted. He developed progressive hypoxia with concern for aspiration pneumonia. He was transferred to ICU and required intubation. General surgery was consulted to assess incisional hernia, and no surgical intervention was needed. He continued to have increased oxygenation needs and not able to be weaned from vent. Family decided against additional aggressive measures. DNR.   New Events   Had a fever  Sedated, no purposeful response  Awaiting family to arrive from [place], and then likely transition to comfort care.    Current Facility-Administered Medications:  .  0.9 %  sodium chloride infusion, , Intravenous, Continuous, Raylene Miyamoto, MD, Last Rate: 10 mL/hr at 02/15/17 0700 .  acetaminophen (TYLENOL) suppository 650 mg, 650 mg, Rectal, Q6H PRN, Acquanetta Chain, DO, 650 mg at 02/15/17 0750 .  chlorhexidine gluconate (MEDLINE KIT) (PERIDEX) 0.12 % solution 15 mL, 15 mL, Mouth Rinse, BID, Rush Farmer, MD, 15 mL at 02/15/17 0800 .  HYDROmorphone (DILAUDID) 50 mg in sodium chloride 0.9 % 100 mL (0.5 mg/mL) infusion, 4-8 mg/hr, Intravenous, Continuous, Lane Hacker L, DO, Last Rate: 10 mL/hr at 02/15/17 0700, 5 mg/hr at 02/15/17 0700 .  HYDROmorphone (DILAUDID) bolus via infusion 2 mg, 2 mg, Intravenous, Q30 min PRN, Lane Hacker L, DO, 2 mg at 02/15/17 0636 .  ipratropium (ATROVENT) nebulizer solution 0.5 mg, 0.5 mg, Nebulization, TID, Evangeline Gula Wyatt Haste, MD, 0.5 mg at 02/15/17 0737 .  levalbuterol (XOPENEX) nebulizer solution 0.63 mg, 0.63 mg, Nebulization, TID, Evangeline Gula Wyatt Haste, MD, 0.63 mg at 02/15/17 0737 .  LORazepam (ATIVAN)  injection 1 mg, 1 mg, Intravenous, Q4H, Golding, Elizabeth L, DO, 1 mg at 02/15/17 1031 .  LORazepam (ATIVAN) injection 1 mg, 1 mg, Intravenous, Q2H PRN, Lane Hacker L, DO .  MEDLINE mouth rinse, 15 mL, Mouth Rinse, QID, Rush Farmer, MD, 15 mL at 02/15/17 0400   PHYSICAL EXAM:   Blood pressure 102/63, pulse 84, temperature (!) 101.5 F (38.6 C), temperature source Axillary, resp. rate 18, height _0  (1.676 m), weight 163 lb 2.3 oz (74 kg), SpO2 97 %.  NAD, on vent, comfortable Eyes: Pupils equal, Neck: No neck swelling CVS:  -s1 s2 regular Resp:Breath sounds equal; no rales/rhonchi Abdomen:abd-soft, non tender,    BS+ Hernias noted Extremities-no cyanosis Neuro:sedated, Tone  Normal Skin: no rash Integumentary: No clubbing   Ventilator  Fi02- 40%, Peep- 10, Rate- 18, Vt-  531m Pip- 40  ;No AutoPEEP  Current medication list reviewed.    LABS:   Results for YSHAWNTA, ZIMBELMAN(MRN [id number] as of 02/14/2017 13:11  Ref. Range 02/06/2017 04:55  pH, Arterial Latest Ref Range: 7.350 - 7.450  7.406  pCO2 arterial Latest Ref Range: 32.0 - 48.0 mmHg 51.8 (H)  pO2, Arterial Latest Ref Range: 83.0 - 108.0 mmHg 76.7 (L)  Acid-Base Excess Latest Ref Range: 0.0 - 2.0 mmol/L 7.2 (H)  Bicarbonate Latest Ref Range: 20.0 - 28.0 mmol/L 31.9 (H)   Results for YLORN, BUTCHER(MRN [id number] as of 02/14/2017 13:11  Ref. Range 02/12/2017 02:30  WBC Latest Ref Range: 4.0 - 10.5 K/uL 14.3 (H)  RBC Latest Ref Range: 4.22 - 5.81 MIL/uL 3.67 (L)  Hemoglobin Latest Ref Range: 13.0 -  17.0 g/dL 9.6 (L)  HCT Latest Ref Range: 39.0 - 52.0 % 31.9 (L)  MCV Latest Ref Range: 78.0 - 100.0 fL 86.9  MCH Latest Ref Range: 26.0 - 34.0 pg 26.2  MCHC Latest Ref Range: 30.0 - 36.0 g/dL 30.1  RDW Latest Ref Range: 11.5 - 15.5 % 16.1 (H)  Platelets Latest Ref Range: 150 - 400 K/uL 302   Results for ANDRUS, SHARP (MRN [id number]) as of 02/14/2017 13:11  Ref. Range 02/12/2017 02:30  Sodium Latest  Ref Range: 135 - 145 mmol/L 139  Potassium Latest Ref Range: 3.5 - 5.1 mmol/L 3.7  Chloride Latest Ref Range: 101 - 111 mmol/L 105  CO2 Latest Ref Range: 22 - 32 mmol/L 29  Glucose Latest Ref Range: 65 - 99 mg/dL 125 (H)  BUN Latest Ref Range: 6 - 20 mg/dL 17  Creatinine Latest Ref Range: 0.61 - 1.24 mg/dL 0.63  Calcium Latest Ref Range: 8.9 - 10.3 mg/dL 7.8 (L)  Anion gap Latest Ref Range: 5 - 15  5    CHEST X-RAY:  12/11- bilateral basal atelectases/pleural effusions ETT, G tube- good position    Ekg12/3- sinus-rythm   12/6 echo Left ventricle: Systolic function was normal. The estimated ejection fraction was in the range of 55% to 60%. Wall motion was normal; there were no regional wall motion abnormalities. Left ventricular diastolic function parameters were normal. - Left atrium: The atrium was mildly dilated. - Pulmonary arteries: Systolic pressure was moderately increased. PA peak pressure: 51 mm Hg     12/5 CT C,A,P 1. Bilateral lower lobe pneumonia with small pleural effusions has developed since 02/01/2017, more extensive on the left. 2. Superimposed developing bronchopneumonia suspected in the left lingula and upper lobe. 3. Complex, comminuted right hemipelvis fracture with intraosseous hematoma, and a mild to moderate volume of iliopsoas and retroperitoneal hematoma which has increased since 02/01/2017. Only mild mass effect on abdominal and pelvic viscera at this time. 4. Complex ventral abdominal hernia, the cephalad extent of which appears easily reducible while the caudal extent is more pedunculated and contains small bowel. No evidence of incarceration at this time. No bowel obstruction suspected. 5. Acute nondisplaced left anterior third rib fracture. Numerous superimposed bilateral subacute and/or chronic rib fractures. Widespread non-acute spinal compression fractures. 6. Dilated gallbladder and mild periportal edema. But no strong  CT evidence of acute cholecystitis. 7. ET tube and enteric tube in place. The enteric tube tip terminates at a gastrojejunostomy. 8. Metallic highly dense ingested foreign body located dependently in the stomach remnant. Perhaps this is a piece of dental amalgam. 9. Cardiomegaly. Ectatic abdominal aorta at risk for aneurysm development.    Assessment- Plan:  Acute hypoxic respiratory failure Aspiration pneumonia Comminuted displaced fracture Rt iliac wing Non displaced Rt inferior pubic ramus fracture Extraperitoneal hemorrhage from pelvic fracture COPD A fib with RVR Hx of Depression Hx of duodenal ulcer Hx of hypertension Incisional hernia  Plan: Continue full vent support  PEEP/FiO2 to keep SpO2 > 88% Hold off  additional lab testing or imaging studies  Continue Ativan Dilaudid drip  for comfort  Family were told in the past  that he might not be able to sustain his oxygenation long enough to allow family to arrive from [place]. Son updated at bedside.    Prognosis poor  Thank you for letting me participate in the care of your patient.    ^^^^^^^^^^ I  Have personally spent   45  Minutes  In the care of this Patient  providing Critical care Services; Time includes review of chart, labs, imaging, coordinating care with other physicians and healthcare team members. Also includes time for frequent reevaluation and additional treatment implementation due to change in clinical condiiton of patient. Excludes time spent for Procedure and Teaching.   ^^^^^^^^^^  Note subject to typographical and grammatical errors;   Any formal questions or concerns about the content, text, or information contained within the body of this dictation should be directly addressed to the physician  for  clarification.   Evans Lance, MD Pulmonary and [place] Pager: [phone]

## 2017-02-15 NOTE — Progress Notes (Addendum)
Daily Progress Note   Patient Name: Neil Terry       Date: 02/15/2017 DOB: [birth year]  Age: 83 y.o. MRN#: [id number] Attending Physician: Rush Farmer, MD Primary Care Physician: Patient, No Pcp Per Admit Date: 02/25/2017  Reason for Consultation/Follow-up: Establishing goals of care, Non pain symptom management, Terminal Care and Withdrawal of life-sustaining treatment  Subjective: Patient unresponsive. Continues to exhibit signs of pain/discomfort for nursing staff. Slight brow furrow when I was at bedside. Patient recently given scheduled Valium. Receiving Dilaudid 16m/hr continuous infusion.   GOC:  I met with two sons (S66and OIleana Ladd and grandson in family conference room. RN included. I met them during initial palliative GSanta [place]on 12/11. Again discussed course of hospitalization and interventions. Explained my concern that we are prolonging his suffering and he continues to show signs of discomfort/agitation despite continuous dilaudid and scheduled ativan. [name] and Osama agree that their dad is suffering and has been more uncomfortable and unresponsive in the last few days. [name] speaks of video [place] last week, and the patient telling her to come.   It remains very important for their sister to get to [place] from [place] They again speak of her in great distress when their mother died and she was unable to be present for the last days and funeral rituals. [name] and OIleana Laddare hopeful her VQuinn Axewill be approved by Tuesday or Wednesday and she will be on a flight as soon as possible.   Osama asks how long we can continue with the current plan of care. I encouraged them to keep their father at the center and again explained every further day is another day of  [place]ore[place]ong[place] his suffering. That we cannot allow this medically and ethically to go on much longer. (There had been word of another week). They shake their head "no" to allowing another week of this. [name] questions withdrawal of care Thursday or Friday, whether the sister has arrived or not. At the beginning of our conversation, [name] spoke of his father being able to endure pain/suffering in order to see his daughter from [place] They are very hopeful to have an answer by tomorrow. Osama states "bear with [place]"   We also talked about anything happening at any time, with his blood pressure  remaining soft and HR lower than it has been.    I explored if all family members are understanding of the goals of care and how critically ill he is (women at bedside this weekend were asking nursing staff why Mr. Armon wasn't woken up by RN every 15 minutes). [name] and Osama tell me they have spoken with family and they have a good understanding of terminal extubation when sister arrives. They said to "defer" family members to them if there are questions or concerns.   We discussed process of terminal extubation with goal of comfort and dignity at EOL. Explained short prognosis of likely hours. [name] and Osama confirm understanding.   Osama and [name] share cultural and spiritual values that are important. [name] tells me his father can recite the entire [place]. When he dies, it is important for them to bathe him before burial and also for him to be buried as soon as possible after death.   Family very appreciative of the care and support from all team members.   Answered questions. They have PMT contact information.   Length of Stay: 14  Current Medications: Scheduled Meds:  . chlorhexidine gluconate (MEDLINE KIT)  15 mL Mouth Rinse BID  . ipratropium  0.5 mg Nebulization TID  . levalbuterol  0.63 mg Nebulization TID  . LORazepam  1 mg Intravenous Q4H  . mouth rinse  15 mL Mouth Rinse QID    Continuous Infusions: .  sodium chloride 10 mL/hr at 02/15/17 0700  . HYDROmorphone 5 mg/hr (02/15/17 0700)    PRN Meds: acetaminophen, HYDROmorphone, LORazepam  Physical Exam  Constitutional: He appears ill. He is sedated and intubated.  HENT:  Head: Normocephalic and atraumatic.  Cardiovascular: Regular rhythm.  Pulmonary/Chest: He is intubated. He has decreased breath sounds.  Genitourinary:  Genitourinary Comments: Foley  Musculoskeletal: He exhibits edema (generalized).  Neurological: He is unresponsive.  Skin: Skin is warm and dry.  Nursing note and vitals reviewed.           Vital Signs: BP 102/63   Pulse 84   Temp (!) 101.5 F (38.6 C) (Axillary)   Resp 18   Ht '5\' 6"'$  (1.676 m)   Wt 74 kg (163 lb 2.3 oz)   SpO2 97%   BMI 26.33 kg/m  SpO2: SpO2: 97 % O2 Device: O2 Device: Ventilator O2 Flow Rate: O2 Flow Rate (L/min): 10 L/min  Intake/output summary:   Intake/Output Summary (Last 24 hours) at 02/15/2017 1052 Last data filed at 02/15/2017 1000 Gross per 24 hour  Intake 500 ml  Output 250 ml  Net 250 ml   LBM: Last BM Date: 02/13/17 Baseline Weight: Weight: 73.5 kg (162 lb) Most recent weight: Weight: 74 kg (163 lb 2.3 oz)       Palliative Assessment/Data:  PPS 10%   Flowsheet Rows     Most Recent Value  Intake Tab  Referral Department  Critical care  Unit at Time of Referral  ICU  Palliative Care Primary Diagnosis  Trauma  Date Notified  02/09/17  Palliative Care Type  New Palliative care  Reason for referral  Clarify Goals of Care  Date of Admission  02/27/2017  Date first seen by Palliative Care  02/10/17  # of days Palliative referral response time  1 Day(s)  # of days IP prior to Palliative referral  9  Clinical Assessment  Palliative Performance Scale Score  10%  Psychosocial & Spiritual Assessment  Palliative Care Outcomes  Patient/Family meeting held?  Yes  Who was at the meeting?  multiple family members including sons  Palliative Care Outcomes  Clarified  goals of care, Provided end of life care assistance, Provided psychosocial or spiritual support      Patient Active Problem List   Diagnosis Date Noted  . Pressure injury of skin 02/14/2017  . Fracture   . Advance care planning   . Palliative care by specialist   . Goals of care, counseling/discussion   . Closed fracture of both anterior and posterior columns of acetabulum ([place]) 02/05/2017  . Pelvic fracture ([place]) 02/01/2017  . Hip fracture requiring operative repair, right, closed, initial encounter ([place]) 02/01/2017  . H/O: GI bleed 02/01/2017  . Traumatic retroperitoneal hemorrhage 02/01/2017  . Coronary artery calcification seen on CAT scan   . Abnormal nuclear stress test   . Dyspnea on exertion 04/09/2016  . Bowel obstruction ([place]) 04/09/2016  . Abdominal pain   . Hernia, abdominal 01/25/2016  . GERD without esophagitis 01/25/2016  . Incisional hernia   . Chronic anticoagulation 11/24/2014  . PAF (paroxysmal atrial fibrillation) ([place]) 11/10/2014  . Atrial fibrillation with RVR ([place]) 11/10/2014  . Chest pain 11/10/2014  . RUQ pain 11/10/2014  . Renal cyst, right 11/10/2014  . AAA (abdominal aortic aneurysm) without rupture ([place]) 11/10/2014  . Ventral hernia 11/10/2014  . BPH (benign prostatic hyperplasia) 11/10/2014  . Nausea and vomiting 11/09/2014  . CAP (community acquired pneumonia) 11/09/2014  . COPD exacerbation ([place]) 11/09/2014  . Abdominal pain, epigastric 11/09/2014  . Right hip pain 11/08/2014  . Acute urinary retention 11/08/2014  . Acute on chronic respiratory failure with hypoxia ([place]) 11/08/2014  . Fall   . Hypoxia     Palliative Care Assessment & Plan   Patient Profile: 83 y.o.malewith past medical history of COPD on home oxygen, hypertension, GERD, depression, BPH, duodenal ulcer, abdominal hernias with bowel obstruction, and fallsadmitted on 12/1/2018with right hip pain after a fall at home.In ED, xray revealed right acetabular and right  iliopubic ring fracture. Shortly after arrival to ED, heart rate increased to 140's with rapid ventricular response and intermittent nonsustained ventricular tachycardia. Temp up to 100.6 rectally and with tachypnea. Patient intubated on 12/3 and has been unable to wean secondary to pain from pelvic and rib fracture. Changed to limited code on 12/11. Plan is for one-way extubation when all family arrives.   Palliative medicine consultation for goals of care/terminal care.   Recommendations/Plan:  Continue current plan of care with goal of terminal extubation/withdrawing care when sister arrives from [place].   Sons are hopeful to have an answer by Tuesday or Wednesday. If not, we spoke of extubating by Thursday/Friday regardless. Sons agree he is suffering but it remains very important for them to try and get their sister here.   Continue current symptom management medications.   PMT will continue to follow-up with family daily.   Code Status:    Code Status Orders  (From admission, onward)        Start     Ordered   02/10/17 1522  Limited resuscitation (code)  Continuous    Question Answer Comment  In the event of cardiac or respiratory ARREST: Initiate Code Blue, Call Rapid Response No   In the event of cardiac or respiratory ARREST: Perform CPR No   In the event of cardiac or respiratory ARREST: Perform Intubation/Mechanical Ventilation Yes   In the event of cardiac or respiratory ARREST: Use NIPPV/BiPAp only if indicated Yes   In the event of cardiac  or respiratory ARREST: Administer ACLS medications if indicated No   In the event of cardiac or respiratory ARREST: Perform Defibrillation or Cardioversion if indicated No      02/10/17 1521    Code Status History    Date Active Date Inactive Code Status Order ID Comments User Context   02/01/2017 08:18 02/10/2017 15:21 Full Code [id number]  Lady Deutscher, MD Inpatient   01/25/2016 16:48 01/30/2016 19:08 Full Code [id number]   Waldemar Dickens, MD ED   11/08/2014 01:38 11/12/2014 21:50 Full Code [id number]  Etta Quill, DO ED       Prognosis:  Hours when terminally extubated.  Discharge Planning:  Anticipated Hospital Death    Thank you for allowing the Palliative Medicine Team to assist in the care of this patient.   Time In: 1500 Time Out: 1615 Total Time 75 min Prolonged Time Billed yes      Greater than 50%  of this time was spent counseling and coordinating care related to the above assessment and plan.  Ihor Dow, FNP-C Palliative Medicine Team  Phone: [phone] Fax: [phone]  Please contact Palliative Medicine Team phone at [phone] for questions and concerns.

## 2017-02-16 LAB — GLUCOSE, CAPILLARY: GLUCOSE-CAPILLARY: 121 mg/dL — AB (ref 65–99)

## 2017-02-16 MED ORDER — DIAZEPAM 5 MG/ML IJ SOLN
5.0000 mg | Freq: Two times a day (BID) | INTRAMUSCULAR | Status: DC
  Administered 2017-02-16 – 2017-02-20 (×9): 5 mg via INTRAVENOUS
  Filled 2017-02-16 (×10): qty 2

## 2017-02-16 NOTE — Progress Notes (Signed)
Nutrition Brief Note  Chart reviewed.  Discussed patient in ICU rounds and with RN today. Patient's family does not want any escalation of care, including trach & PEG.  Patient remains intubated.  No longer receiving TF.  Plans to transition to comfort care when daughter arrives from [place]. No further nutrition interventions warranted at this time.  Please re-consult as needed.   Joaquin CourtsKimberly Harris, RD, LDN, CNSC Pager [phone] After Hours Pager [phone]

## 2017-02-16 NOTE — Progress Notes (Signed)
Communicated with Palliative care about current plan for pt with daughter coming from [place]. Nursing Staff is unsure if pt's daughter has been granted Visa for travel at this time and nursing concern with pt current comfort management.

## 2017-02-16 NOTE — Progress Notes (Signed)
   Name: Neil Terry MRN: [id number] DOB: Nov 17, 1927     ADMISSION DATE:  02/04/2017 CONSULTATION DATE:  02/01/2017  REFERRING MD:  Brett CanalesSheehan, Theresa  Summary:   83 yo male had fall at home. Found to have Rt acetabular and pelvic fracture. Orthopedics consulted, and decided for non operative management given age and comorbid conditions. He developed A fib with RVR and cardiology consulted. He developed progressive hypoxia with concern for aspiration pneumonia. He was transferred to ICU and required intubation. General surgery was consulted to assess incisional hernia, and no surgical intervention was needed. He continued to have increased oxygenation needs and not able to be weaned from vent. Family decided against additional aggressive measures. DNR  SUBJECTIVE:  No family at bedside.  [place] from [place].   Remains on dilaudid drip, minimal response to pain  VITAL SIGNS: Pulse Rate:  [84-117] 105 (12/17 0600) Resp:  [18-21] 18 (12/17 0600) BP: (79-102)/(55-63) 83/59 (12/17 0600) SpO2:  [96 %-100 %] 96 % (12/17 0730) FiO2 (%):  [40 %] 40 % (12/17 0730)  PHYSICAL EXAMINATION: General:  Critically ill appearing male in NAD on MV HEENT: ETT/OGT-clamped, pupils 2/sluggish Neuro: sedated, minimal response to noxious stimuli, no f/c or opens eyes, will resist movement in extremities CV: IRIR 120's, +2 dp PULM: even/non-labored on MV, lungs bilaterally rhonchi, scattered exp wheeze GI: soft, bs active, + hernias Extremities: warm/dry, generalized edema, keeps right leg flexed Skin: no rashes   Recent Labs  Lab 02/10/17 0259 02/11/17 0254 02/12/17 0230  NA 134* 136 139  K 3.9 3.7 3.7  CL 101 104 105  CO2 27 27 29   BUN 18 22* 17  CREATININE 0.62 0.76 0.63  GLUCOSE 95 122* 125*   Recent Labs  Lab 02/10/17 0259 02/11/17 0254 02/12/17 0230  HGB 10.2* 9.5* 9.6*  HCT 32.7* 31.3* 31.9*  WBC 11.7* 14.4* 14.3*  PLT 267 258 302   No results  found.  ASSESSMENT / PLAN:  Acute hypoxic respiratory failure Aspiration pneumonia Comminuted displaced fracture Rt iliac wing Non displaced Rt inferior pubic ramus fracture Extraperitoneal hemorrhage from pelvic fracture COPD A fib with RVR Hx of Depression Hx of duodenal ulcer Hx of hypertension Incisional hernia  Plan: Continue full vent support PEEP/FiO2 to keep SpO2 >88% Hold offadditional lab testing or imaging studies Continue Ativan prn Dilaudid drip  for comfort Appreciate PMT assistance  Global: Prognosis is poor.  Awaiting family to arrive from [place] prior to transitioning to full comfort care;  however there is concern how long patient will be able to sustain.  Intubated since 12/3.  No family at bedside on rounds 12/17.  Will need to attempt to set timeframe for realistic goals with family when they arrive.   Posey BoyerBrooke Ralynn San, AGACNP-BC [facility] Pgr: [phone] or if no answer [phone] 02/16/2017, 9:21 AM

## 2017-02-17 DIAGNOSIS — Z515 Encounter for palliative care: Secondary | ICD-10-CM

## 2017-02-17 NOTE — Care Management (Addendum)
Discussed in LOS 12/18 -  Per physician advisor pt may be appropriate for the [facility] palliative program - [facility] Liaison accepted tentative referral.  CM spoke with Palliative NP - Palliative team to discuss referral recommendationl for program and NP will follow back up with CM.   Update: Plan is for attending and Palliative to discuss with family one way extubation no later than Friday 12/.21/18 [facility](LTACH referral declined)

## 2017-02-17 NOTE — Progress Notes (Addendum)
Daily Progress Note   Patient Name: Neil Terry       Date: 02/17/2017 DOB: August 16, 1927  Age: 83 y.o. MRN#: [id number] Attending Physician: Marshell Garfinkel, MD Primary Care Physician: Patient, No Pcp Per Admit Date: 02/27/2017  Reason for Consultation/Follow-up: Establishing goals of care, Non pain symptom management, Terminal Care and Withdrawal of life-sustaining treatment  Subjective: Patient unresponsive. Appears more comfortable compared to yesterday. Receiving Dilaudid '6mg'$ /hr and scheduled Valium. RN bolus via infusion as needed.   GOC:  Follow-up with [name] and Osama in family waiting area. Still have not received Visa approval for sister. Remain hopeful that tomorrow they will hear. [name] points to the ceiling and states "leaving it up to him." [name] and Osama understand the decision to withdraw care needs to be made soon. They are reassured that he is more comfortable today but also understand he continues to suffer the more we prolong EOL.   Emotional and spiritual support provided.    Length of Stay: 16  Current Medications: Scheduled Meds:  . chlorhexidine gluconate (MEDLINE KIT)  15 mL Mouth Rinse BID  . diazepam  5 mg Intravenous Q12H  . mouth rinse  15 mL Mouth Rinse QID    Continuous Infusions: . sodium chloride 10 mL/hr at 02/15/17 0700  . HYDROmorphone 6 mg/hr (02/17/17 1324)    PRN Meds: acetaminophen, HYDROmorphone  Physical Exam  Constitutional: He appears ill. He is sedated and intubated.  HENT:  Head: Normocephalic and atraumatic.  Cardiovascular: Regular rhythm.  Pulmonary/Chest: He is intubated. He has decreased breath sounds.  Genitourinary:  Genitourinary Comments: Foley  Musculoskeletal: He exhibits edema (generalized).  Neurological: He is  unresponsive.  Skin: Skin is warm and dry.  Nursing note and vitals reviewed.          Vital Signs: BP 91/62   Pulse 94   Temp 98.6 F (37 C) (Oral)   Resp 18   Ht '5\' 6"'$  (1.676 m)   Wt 74 kg (163 lb 2.3 oz)   SpO2 99%   BMI 26.33 kg/m  SpO2: SpO2: 99 % O2 Device: O2 Device: Ventilator O2 Flow Rate: O2 Flow Rate (L/min): 10 L/min  Intake/output summary:   Intake/Output Summary (Last 24 hours) at 02/17/2017 1505 Last data filed at 02/17/2017 1500 Gross per 24 hour  Intake 530.33 ml  Output  225 ml  Net 305.33 ml   LBM: Last BM Date: (UTA, PTA?) Baseline Weight: Weight: 73.5 kg (162 lb) Most recent weight: Weight: 74 kg (163 lb 2.3 oz)       Palliative Assessment/Data:  PPS 10%   Flowsheet Rows     Most Recent Value  Intake Tab  Referral Department  Critical care  Unit at Time of Referral  ICU  Palliative Care Primary Diagnosis  Trauma  Date Notified  02/09/17  Palliative Care Type  New Palliative care  Reason for referral  Clarify Goals of Care  Date of Admission  02/17/2017  Date first seen by Palliative Care  02/10/17  # of days Palliative referral response time  1 Day(s)  # of days IP prior to Palliative referral  9  Clinical Assessment  Palliative Performance Scale Score  10%  Psychosocial & Spiritual Assessment  Palliative Care Outcomes  Patient/Family meeting held?  Yes  Who was at the meeting?  multiple family members including sons  Palliative Care Outcomes  Clarified goals of care, Provided end of life care assistance, Provided psychosocial or spiritual support      Patient Active Problem List   Diagnosis Date Noted  . Pressure injury of skin 02/14/2017  . Fracture   . Advance care planning   . Palliative care by specialist   . Goals of care, counseling/discussion   . Closed fracture of both anterior and posterior columns of acetabulum ([place]) 02/05/2017  . Pelvic fracture ([place]) 02/01/2017  . Hip fracture requiring operative repair, right, closed,  initial encounter ([place]) 02/01/2017  . H/O: GI bleed 02/01/2017  . Traumatic retroperitoneal hemorrhage 02/01/2017  . Coronary artery calcification seen on CAT scan   . Abnormal nuclear stress test   . Dyspnea on exertion 04/09/2016  . Bowel obstruction ([place]) 04/09/2016  . Abdominal pain   . Hernia, abdominal 01/25/2016  . GERD without esophagitis 01/25/2016  . Incisional hernia   . Chronic anticoagulation 11/24/2014  . PAF (paroxysmal atrial fibrillation) ([place]) 11/10/2014  . Atrial fibrillation with RVR ([place]) 11/10/2014  . Chest pain 11/10/2014  . RUQ pain 11/10/2014  . Renal cyst, right 11/10/2014  . AAA (abdominal aortic aneurysm) without rupture ([place]) 11/10/2014  . Ventral hernia 11/10/2014  . BPH (benign prostatic hyperplasia) 11/10/2014  . Nausea and vomiting 11/09/2014  . CAP (community acquired pneumonia) 11/09/2014  . COPD exacerbation ([place]) 11/09/2014  . Abdominal pain, epigastric 11/09/2014  . Right hip pain 11/08/2014  . Acute urinary retention 11/08/2014  . Acute on chronic respiratory failure with hypoxia ([place]) 11/08/2014  . Fall   . Hypoxia     Palliative Care Assessment & Plan   Patient Profile: 83 y.o.malewith past medical history of COPD on home oxygen, hypertension, GERD, depression, BPH, duodenal ulcer, abdominal hernias with bowel obstruction, and fallsadmitted on 12/1/2018with right hip pain after a fall at home.In ED, xray revealed right acetabular and right iliopubic ring fracture. Shortly after arrival to ED, heart rate increased to 140's with rapid ventricular response and intermittent nonsustained ventricular tachycardia. Temp up to 100.6 rectally and with tachypnea. Patient intubated on 12/3 and has been unable to wean secondary to pain from pelvic and rib fracture. Changed to limited code on 12/11. Plan is for one-way extubation when all family arrives. Palliative medicine consultation for goals of care/terminal  care.   Recommendations/Plan:  Continue current plan of care with goal of terminal extubation/withdrawing care when sister arrives from [place].   Daughter's visa is still not  approved. We spoke of extubating by Thursday/Friday regardless. Sons agree he is suffering but it remains very important for them to try and get their sister here.   Continue current symptom management medications.   Discussed with RN CM. I do not feel he is stable for transfer or appropriate to offer family option of transferring to [facility] for palliative program. They have made clear goals of no trach/peg with terminal wean. They are understanding and accepting of prognosis and EOL, just waiting for visa approval.   PMT will continue to follow-up with family daily. Will facilitate meeting with PCCM and sons tomorrow afternoon and determine extubation date.   Code Status:    Code Status Orders  (From admission, onward)        Start     Ordered   02/10/17 1522  Limited resuscitation (code)  Continuous    Question Answer Comment  In the event of cardiac or respiratory ARREST: Initiate Code Blue, Call Rapid Response No   In the event of cardiac or respiratory ARREST: Perform CPR No   In the event of cardiac or respiratory ARREST: Perform Intubation/Mechanical Ventilation Yes   In the event of cardiac or respiratory ARREST: Use NIPPV/BiPAp only if indicated Yes   In the event of cardiac or respiratory ARREST: Administer ACLS medications if indicated No   In the event of cardiac or respiratory ARREST: Perform Defibrillation or Cardioversion if indicated No      02/10/17 1521    Code Status History    Date Active Date Inactive Code Status Order ID Comments User Context   02/01/2017 08:18 02/10/2017 15:21 Full Code [id number]  Lady Deutscher, MD Inpatient   01/25/2016 16:48 01/30/2016 19:08 Full Code [id number]  Waldemar Dickens, MD ED   11/08/2014 01:38 11/12/2014 21:50 Full Code [id number]  Etta Quill, DO  ED       Prognosis:  Hours when terminally extubated.  Discharge Planning:  Anticipated Hospital Death  Discussed case during PMT rounds and also with RN, Jerene Pitch PCCM NP, sons ([name] and [place]), and RN CM.   Thank you for allowing the Palliative Medicine Team to assist in the care of this patient.     Time In: 1430 Time Out: 1505 Total Time 5mn Prolonged Time Billed no      Greater than 50%  of this time was spent counseling and coordinating care related to the above assessment and plan.  MIhor Dow FNP-C Palliative Medicine Team  Phone: [phone]: [phone] Please contact Palliative Medicine Team phone at [phone] questions and concerns.

## 2017-02-17 NOTE — Progress Notes (Signed)
   Name: Neil MinksFarid Terry MRN: [id number] DOB: [birth year]     ADMISSION DATE:  02/27/2017 CONSULTATION DATE:  02/01/2017  REFERRING MD:  Brett CanalesSheehan, Theresa  Summary:   83 yo male had fall at home. Found to have Rt acetabular and pelvic fracture. Orthopedics consulted, and decided for non operative management given age and comorbid conditions. He developed A fib with RVR and cardiology consulted. He developed progressive hypoxia with concern for aspiration pneumonia. He was transferred to ICU and required intubation. General surgery was consulted to assess incisional hernia, and no surgical intervention was needed. He continued to have increased oxygenation needs and not able to be weaned from vent. Family decided against additional aggressive measures. DNR  SUBJECTIVE:  No change in status Remains on Dilaudid drip.  Minimally responsive.  VITAL SIGNS: Temp:  [98.6 F (37 C)] 98.6 F (37 C) (12/17 1924) Pulse Rate:  [70-101] 76 (12/18 1200) Resp:  [17-18] 18 (12/18 1200) BP: (81-108)/(57-72) 108/72 (12/18 1200) SpO2:  [94 %-100 %] 98 % (12/18 1200) FiO2 (%):  [40 %] 40 % (12/18 1100)  PHYSICAL EXAMINATION: Gen:      No acute distress, frail, chronically ill. HEENT:  EOMI, sclera anicteric, ETT Neck:     No masses; no thyromegaly Lungs:    Clear to auscultation bilaterally; normal respiratory effort CV:         Regular rate and rhythm; no murmurs Abd:      + bowel sounds; soft, non-tender; no palpable masses, no distension Ext:    No edema; adequate peripheral perfusion Skin:      Warm and dry; no rash Neuro: Sedated, unresponsive.   Recent Labs  Lab 02/11/17 0254 02/12/17 0230  NA 136 139  K 3.7 3.7  CL 104 105  CO2 27 29  BUN 22* 17  CREATININE 0.76 0.63  GLUCOSE 122* 125*   Recent Labs  Lab 02/11/17 0254 02/12/17 0230  HGB 9.5* 9.6*  HCT 31.3* 31.9*  WBC 14.4* 14.3*  PLT 258 302   No results found.  ASSESSMENT / PLAN: Acute hypoxic respiratory  failure Aspiration pneumonia Comminuted displaced fracture Rt iliac wing Non displaced Rt inferior pubic ramus fracture Extraperitoneal hemorrhage from pelvic fracture COPD A fib with RVR Hx of Depression Hx of duodenal ulcer Hx of hypertension Incisional hernia  Plan: Continue full vent support No blood draws or imaging Continue Ativan as needed, Dilaudid for comfort Started on Valium every 12  Global: Prognosis is poor.  Awaiting family to arrive from [place] prior to transitioning to full comfort care;  however there is concern how long patient will be able to sustain.  Intubated since 12/3.   Appreciate help from palliative care with difficult family discussions.  The patient is critically ill with multiple organ system failure and requires high complexity decision making for assessment and support, frequent evaluation and titration of therapies, advanced monitoring, review of radiographic studies and interpretation of complex data.   Critical Care Time devoted to patient care services, exclusive of separately billable procedures, described in this note is 35 minutes.   Chilton GreathousePraveen Nichol Ator MD [facility] Pager [phone] If no answer or after 3pm call: [phone] 02/17/2017, 12:50 PM

## 2017-02-18 DIAGNOSIS — E440 Moderate protein-calorie malnutrition: Secondary | ICD-10-CM

## 2017-02-18 NOTE — Progress Notes (Signed)
Daily Progress Note   Patient Name: Neil Terry       Date: 02/18/2017 DOB: Jul 12, 1927  Age: 83 y.o. MRN#: [id number] Attending Physician: Marshell Garfinkel, MD Primary Care Physician: Patient, No Pcp Per Admit Date: 02/11/2017  Reason for Consultation/Follow-up: Establishing goals of care, Non pain symptom management, Terminal Care and Withdrawal of life-sustaining treatment  Subjective: Patient unresponsive. Appears comfortable. Receiving Dilaudid '6mg'$ /hr and scheduled Valium. RN bolus via infusion as needed.   GOC:  Follow-up with Neil Terry and the patient's grandson. Dr Vaughan Browner and RN present. Still have not received visa approval. I offered to write Neil Terry another letter to provide to the [place]. We discussed extubating Friday if she still has not received approval. Neil Terry understands a decision needs to be made and will discuss with family tonight. Neil Terry also understands that nursing staff will respect any EOL rituals that are important to them before and after extubation.   Emotional and spiritual support provided.    Length of Stay: 17  Current Medications: Scheduled Meds:  . chlorhexidine gluconate (MEDLINE KIT)  15 mL Mouth Rinse BID  . diazepam  5 mg Intravenous Q12H  . mouth rinse  15 mL Mouth Rinse QID    Continuous Infusions: . sodium chloride 10 mL/hr at 02/15/17 0700  . HYDROmorphone 8 mg/hr (02/18/17 1500)    PRN Meds: acetaminophen, HYDROmorphone  Physical Exam  Constitutional: He appears ill. He is sedated and intubated.  HENT:  Head: Normocephalic and atraumatic.  Cardiovascular: Regular rhythm.  Pulmonary/Chest: He is intubated. He has decreased breath sounds.  Genitourinary:  Genitourinary Comments: Foley  Musculoskeletal: He exhibits edema (generalized).    Neurological: He is unresponsive.  Skin: Skin is warm and dry.  Nursing note and vitals reviewed.          Vital Signs: BP (!) 83/58   Pulse (!) 105   Temp 100.1 F (37.8 C) (Axillary)   Resp 18   Ht '5\' 6"'$  (1.676 m)   Wt 74 kg (163 lb 2.3 oz)   SpO2 93%   BMI 26.33 kg/m  SpO2: SpO2: 93 % O2 Device: O2 Device: Ventilator O2 Flow Rate: O2 Flow Rate (L/min): 10 L/min  Intake/output summary:   Intake/Output Summary (Last 24 hours) at 02/18/2017 1643 Last data filed at 02/18/2017 1500 Gross per 24 hour  Intake 316  ml  Output 590 ml  Net -274 ml   LBM: Last BM Date: (UTA, PTA?) Baseline Weight: Weight: 73.5 kg (162 lb) Most recent weight: Weight: 74 kg (163 lb 2.3 oz)       Palliative Assessment/Data:  PPS 10%   Flowsheet Rows     Most Recent Value  Intake Tab  Referral Department  Critical care  Unit at Time of Referral  ICU  Palliative Care Primary Diagnosis  Trauma  Date Notified  02/09/17  Palliative Care Type  New Palliative care  Reason for referral  Clarify Goals of Care  Date of Admission  02/16/2017  Date first seen by Palliative Care  02/10/17  # of days Palliative referral response time  1 Day(s)  # of days IP prior to Palliative referral  9  Clinical Assessment  Palliative Performance Scale Score  10%  Psychosocial & Spiritual Assessment  Palliative Care Outcomes  Patient/Family meeting held?  Yes  Who was at the meeting?  multiple family members including sons  Palliative Care Outcomes  Clarified goals of care, Provided end of life care assistance, Provided psychosocial or spiritual support      Patient Active Problem List   Diagnosis Date Noted  . Terminal care   . Pressure injury of skin 02/14/2017  . Fracture   . Advance care planning   . Palliative care by specialist   . Goals of care, counseling/discussion   . Closed fracture of both anterior and posterior columns of acetabulum ([place]) 02/05/2017  . Pelvic fracture ([place]) 02/01/2017  . Hip  fracture requiring operative repair, right, closed, initial encounter ([place]) 02/01/2017  . H/O: GI bleed 02/01/2017  . Traumatic retroperitoneal hemorrhage 02/01/2017  . Coronary artery calcification seen on CAT scan   . Abnormal nuclear stress test   . Dyspnea on exertion 04/09/2016  . Bowel obstruction ([place]) 04/09/2016  . Abdominal pain   . Hernia, abdominal 01/25/2016  . GERD without esophagitis 01/25/2016  . Incisional hernia   . Chronic anticoagulation 11/24/2014  . PAF (paroxysmal atrial fibrillation) ([place]) 11/10/2014  . Atrial fibrillation with RVR ([place]) 11/10/2014  . Chest pain 11/10/2014  . RUQ pain 11/10/2014  . Renal cyst, right 11/10/2014  . AAA (abdominal aortic aneurysm) without rupture ([place]) 11/10/2014  . Ventral hernia 11/10/2014  . BPH (benign prostatic hyperplasia) 11/10/2014  . Nausea and vomiting 11/09/2014  . CAP (community acquired pneumonia) 11/09/2014  . COPD exacerbation ([place]) 11/09/2014  . Abdominal pain, epigastric 11/09/2014  . Right hip pain 11/08/2014  . Acute urinary retention 11/08/2014  . Acute on chronic respiratory failure with hypoxia ([place]) 11/08/2014  . Fall   . Hypoxia     Palliative Care Assessment & Plan   Patient Profile: 83 y.o.malewith past medical history of COPD on home oxygen, hypertension, GERD, depression, BPH, duodenal ulcer, abdominal hernias with bowel obstruction, and fallsadmitted on 12/1/2018with right hip pain after a fall at home.In ED, xray revealed right acetabular and right iliopubic ring fracture. Shortly after arrival to ED, heart rate increased to 140's with rapid ventricular response and intermittent nonsustained ventricular tachycardia. Temp up to 100.6 rectally and with tachypnea. Patient intubated on 12/3 and has been unable to wean secondary to pain from pelvic and rib fracture. Changed to limited code on 12/11. Plan is for one-way extubation when all family arrives. Palliative medicine consultation for goals of  care/terminal care.   Recommendations/Plan:  Continue current plan of care with goal of terminal extubation/withdrawing care when sister arrives from  [place].   Daughter's visa is still not approved. We spoke of extubating by Friday regardless. Sons agree he is suffering but it remains very important for them to try and get their sister here.   Provided another letter to son, Neil Terry to present to [place].   Continue current symptom management medications.   PMT will continue to follow-up with family daily.   Code Status:    Code Status Orders  (From admission, onward)        Start     Ordered   02/10/17 1522  Limited resuscitation (code)  Continuous    Question Answer Comment  In the event of cardiac or respiratory ARREST: Initiate Code Blue, Call Rapid Response No   In the event of cardiac or respiratory ARREST: Perform CPR No   In the event of cardiac or respiratory ARREST: Perform Intubation/Mechanical Ventilation Yes   In the event of cardiac or respiratory ARREST: Use NIPPV/BiPAp only if indicated Yes   In the event of cardiac or respiratory ARREST: Administer ACLS medications if indicated No   In the event of cardiac or respiratory ARREST: Perform Defibrillation or Cardioversion if indicated No      02/10/17 1521    Code Status History    Date Active Date Inactive Code Status Order ID Comments User Context   02/01/2017 08:18 02/10/2017 15:21 Full Code [id number]  Lady Deutscher, MD Inpatient   01/25/2016 16:48 01/30/2016 19:08 Full Code [id number]  Waldemar Dickens, MD ED   11/08/2014 01:38 11/12/2014 21:50 Full Code [id number]  Etta Quill, DO ED       Prognosis:  Hours when terminally extubated.  Discharge Planning:  Anticipated Hospital Death  Discussed case during PMT rounds and also with RN, Dr. Vaughan Browner, son Neil Terry)  Thank you for allowing the Palliative Medicine Team to assist in the care of this patient.     Time In: 1530 Time Out: 1630 Total Time  23mn Prolonged Time Billed no      Greater than 50%  of this time was spent counseling and coordinating care related to the above assessment and plan.  MIhor Dow FNP-C Palliative Medicine Team  Phone: [phone]: [phone] Please contact Palliative Medicine Team phone at [phone] questions and concerns.

## 2017-02-18 NOTE — Progress Notes (Signed)
   Name: Neil Terry MRN: [id number] DOB: 12-02-1927     ADMISSION DATE:  02/10/2017 CONSULTATION DATE:  02/01/2017  REFERRING MD:  Brett CanalesSheehan, Theresa  Summary:   83 yo male had fall at home. Found to have Rt acetabular and pelvic fracture. Orthopedics consulted, and decided for non operative management given age and comorbid conditions. He developed A fib with RVR and cardiology consulted. He developed progressive hypoxia with concern for aspiration pneumonia. He was transferred to ICU and required intubation. General surgery was consulted to assess incisional hernia, and no surgical intervention was needed. He continued to have increased oxygenation needs and not able to be weaned from vent. Family decided against additional aggressive measures. DNR  SUBJECTIVE:  No change in status  VITAL SIGNS: Temp:  [100.1 F (37.8 C)] 100.1 F (37.8 C) (12/19 0600) Pulse Rate:  [62-138] 111 (12/19 1000) Resp:  [17-21] 18 (12/19 1000) BP: (81-108)/(54-72) 101/64 (12/19 1000) SpO2:  [92 %-99 %] 94 % (12/19 1000) FiO2 (%):  [40 %] 40 % (12/19 0821)  PHYSICAL EXAMINATION: Gen:      No acute distress, frail, chronically ill appearing HEENT:  EOMI, sclera anicteric, ETT Neck:     No masses; no thyromegaly,  Lungs:    Clear to auscultation bilaterally; normal respiratory effort CV:         Regular rate and rhythm; no murmurs Abd:      + bowel sounds; soft, non-tender; no palpable masses, no distension Ext:    No edema; adequate peripheral perfusion Skin:      Warm and dry; no rash Neuro: Sedated, unresponsive  Recent Labs  Lab 02/12/17 0230  NA 139  K 3.7  CL 105  CO2 29  BUN 17  CREATININE 0.63  GLUCOSE 125*   Recent Labs  Lab 02/12/17 0230  HGB 9.6*  HCT 31.9*  WBC 14.3*  PLT 302   No results found.  ASSESSMENT / PLAN: Acute hypoxic respiratory failure Aspiration pneumonia Comminuted displaced fracture Rt iliac wing Non displaced Rt inferior pubic ramus  fracture Extraperitoneal hemorrhage from pelvic fracture COPD A fib with RVR Hx of Depression Hx of duodenal ulcer Hx of hypertension Incisional hernia  Plan: Continue full vent support No blood draws or imaging Continue Ativan as needed, Dilaudid for comfort Valium every 12  Global: Prognosis is poor.  Awaiting family to arrive from [place] prior to transitioning to full comfort care;  however there is concern how long patient will be able to sustain.  Intubated since 12/3.   Appreciate help from palliative care with difficult family discussions.  The patient is critically ill with multiple organ system failure and requires high complexity decision making for assessment and support, frequent evaluation and titration of therapies, advanced monitoring, review of radiographic studies and interpretation of complex data.   Critical Care Time devoted to patient care services, exclusive of separately billable procedures, described in this note is 35 minutes.   Chilton GreathousePraveen Denika Krone MD [place] Pager [phone] If no answer or after 3pm call: [phone] 02/18/2017, 10:30 AM

## 2017-02-18 NOTE — Progress Notes (Signed)
Meeting with sons and PCCM today @ 1530.   NO CHARGE  Vennie HomansMegan Blaike Vickers, FNP-C Palliative Medicine Team  Phone: [phone] Fax: [phone]

## 2017-02-19 NOTE — Progress Notes (Signed)
Daily Progress Note   Patient Name: Neil Terry       Date: 02/19/2017 DOB: 05/26/1927  Age: 83 y.o. MRN#: [id number] Attending Physician: Marshell Garfinkel, MD Primary Care Physician: Patient, No Pcp Per Admit Date: 02/14/2017  Reason for Consultation/Follow-up: Establishing goals of care, Non pain symptom management, Terminal Care and Withdrawal of life-sustaining treatment  Subjective:  Patient unresponsive. Appears comfortable. Receiving Dilaudid '4mg'$ /hr and scheduled Valium. Hypotensive today.   GOC:  Another f/u with sons in family waiting area. [name], Osama, and Essam all present. Visa remains pending. We discussed terminal extubation tomorrow. [name] states "tomorrow is not a good day" and mentioned Saturday. He tells me family members have differing opinions--some feel he should have already been extubated. I again explained that we are prolonging his father's suffering the longer we wait.   Per [name]'s request, I sent an email to embassy with my letter from yesterday.    Length of Stay: 18  Current Medications: Scheduled Meds:  . chlorhexidine gluconate (MEDLINE KIT)  15 mL Mouth Rinse BID  . diazepam  5 mg Intravenous Q12H  . mouth rinse  15 mL Mouth Rinse QID    Continuous Infusions: . sodium chloride 10 mL/hr at 02/15/17 0700  . HYDROmorphone 4 mg/hr (02/19/17 1518)    PRN Meds: acetaminophen, HYDROmorphone  Physical Exam  Constitutional: He appears ill. He is sedated and intubated.  HENT:  Head: Normocephalic and atraumatic.  Cardiovascular: Regular rhythm.  Pulmonary/Chest: He is intubated. He has decreased breath sounds.  Genitourinary:  Genitourinary Comments: Foley  Musculoskeletal: He exhibits edema (generalized).  Neurological: He is unresponsive.  Skin: Skin  is warm and dry.  Nursing note and vitals reviewed.          Vital Signs: BP (!) 85/59   Pulse 79   Temp 100.1 F (37.8 C) (Axillary)   Resp 18   Ht '5\' 6"'$  (1.676 m)   Wt 74 kg (163 lb 2.3 oz)   SpO2 96%   BMI 26.33 kg/m  SpO2: SpO2: 96 % O2 Device: O2 Device: Ventilator O2 Flow Rate: O2 Flow Rate (L/min): 10 L/min  Intake/output summary:   Intake/Output Summary (Last 24 hours) at 02/19/2017 1738 Last data filed at 02/19/2017 1700 Gross per 24 hour  Intake 231.47 ml  Output 625 ml  Net -393.53 ml  LBM: Last BM Date: (PTA) Baseline Weight: Weight: 73.5 kg (162 lb) Most recent weight: Weight: 74 kg (163 lb 2.3 oz)       Palliative Assessment/Data:  PPS 10%   Flowsheet Rows     Most Recent Value  Intake Tab  Referral Department  Critical care  Unit at Time of Referral  ICU  Palliative Care Primary Diagnosis  Trauma  Date Notified  02/09/17  Palliative Care Type  New Palliative care  Reason for referral  Clarify Goals of Care  Date of Admission  02/18/2017  Date first seen by Palliative Care  02/10/17  # of days Palliative referral response time  1 Day(s)  # of days IP prior to Palliative referral  9  Clinical Assessment  Palliative Performance Scale Score  10%  Psychosocial & Spiritual Assessment  Palliative Care Outcomes  Patient/Family meeting held?  Yes  Who was at the meeting?  multiple family members including sons  Palliative Care Outcomes  Clarified goals of care, Provided end of life care assistance, Provided psychosocial or spiritual support      Patient Active Problem List   Diagnosis Date Noted  . Malnutrition of moderate degree 02/18/2017  . Terminal care   . Pressure injury of skin 02/14/2017  . Fracture   . Advance care planning   . Palliative care by specialist   . Goals of care, counseling/discussion   . Closed fracture of both anterior and posterior columns of acetabulum ([place]) 02/05/2017  . Pelvic fracture ([place]) 02/01/2017  . Hip  fracture requiring operative repair, right, closed, initial encounter ([place]) 02/01/2017  . H/O: GI bleed 02/01/2017  . Traumatic retroperitoneal hemorrhage 02/01/2017  . Coronary artery calcification seen on CAT scan   . Abnormal nuclear stress test   . Dyspnea on exertion 04/09/2016  . Bowel obstruction ([place]) 04/09/2016  . Abdominal pain   . Hernia, abdominal 01/25/2016  . GERD without esophagitis 01/25/2016  . Incisional hernia   . Chronic anticoagulation 11/24/2014  . PAF (paroxysmal atrial fibrillation) ([place]) 11/10/2014  . Atrial fibrillation with RVR ([place]) 11/10/2014  . Chest pain 11/10/2014  . RUQ pain 11/10/2014  . Renal cyst, right 11/10/2014  . AAA (abdominal aortic aneurysm) without rupture ([place]) 11/10/2014  . Ventral hernia 11/10/2014  . BPH (benign prostatic hyperplasia) 11/10/2014  . Nausea and vomiting 11/09/2014  . CAP (community acquired pneumonia) 11/09/2014  . COPD exacerbation ([place]) 11/09/2014  . Abdominal pain, epigastric 11/09/2014  . Right hip pain 11/08/2014  . Acute urinary retention 11/08/2014  . Acute on chronic respiratory failure with hypoxia ([place]) 11/08/2014  . Fall   . Hypoxia     Palliative Care Assessment & Plan   Patient Profile: 83 y.o.malewith past medical history of COPD on home oxygen, hypertension, GERD, depression, BPH, duodenal ulcer, abdominal hernias with bowel obstruction, and fallsadmitted on 12/1/2018with right hip pain after a fall at home.In ED, xray revealed right acetabular and right iliopubic ring fracture. Shortly after arrival to ED, heart rate increased to 140's with rapid ventricular response and intermittent nonsustained ventricular tachycardia. Temp up to 100.6 rectally and with tachypnea. Patient intubated on 12/3 and has been unable to wean secondary to pain from pelvic and rib fracture. Changed to limited code on 12/11. Plan is for one-way extubation when all family arrives. Palliative medicine consultation for goals of  care/terminal care.   Recommendations/Plan:  Continue current plan of care with goal of terminal extubation/withdrawing care when sister arrives from [place].   Daughter's visa  is still not approved. Possible terminal extubation Friday but now son is saying "tomorrow is not a good day."   Will need further family discussions with PMT/PCCM.    Continue current symptom management medications.   Code Status:    Code Status Orders  (From admission, onward)        Start     Ordered   02/10/17 1522  Limited resuscitation (code)  Continuous    Question Answer Comment  In the event of cardiac or respiratory ARREST: Initiate Code Blue, Call Rapid Response No   In the event of cardiac or respiratory ARREST: Perform CPR No   In the event of cardiac or respiratory ARREST: Perform Intubation/Mechanical Ventilation Yes   In the event of cardiac or respiratory ARREST: Use NIPPV/BiPAp only if indicated Yes   In the event of cardiac or respiratory ARREST: Administer ACLS medications if indicated No   In the event of cardiac or respiratory ARREST: Perform Defibrillation or Cardioversion if indicated No      02/10/17 1521    Code Status History    Date Active Date Inactive Code Status Order ID Comments User Context   02/01/2017 08:18 02/10/2017 15:21 Full Code [id number]  Lady Deutscher, MD Inpatient   01/25/2016 16:48 01/30/2016 19:08 Full Code [id number]  Waldemar Dickens, MD ED   11/08/2014 01:38 11/12/2014 21:50 Full Code [id number]  Etta Quill, DO ED       Prognosis:  Hours when terminally extubated.  Discharge Planning:  Anticipated Hospital Death  Discussed case with RN, sons ([name], Osama, Essam), and PMT   Thank you for allowing the Palliative Medicine Team to assist in the care of this patient.     Time In: 1600 Time Out: 1625 Total Time 47mn Prolonged Time Billed no      Greater than 50%  of this time was spent counseling and coordinating care related to the  above assessment and plan.  MIhor Dow FNP-C Palliative Medicine Team  Phone: [phone]: [phone] Please contact Palliative Medicine Team phone at [phone] questions and concerns.

## 2017-02-19 NOTE — Progress Notes (Signed)
There is an significant amount of family at the bedside. Pt SATs are stable at this time. Pt appears comfortable.

## 2017-02-19 NOTE — Progress Notes (Signed)
   Name: Neil Terry MRN: [id number] DOB: 06-Apr-1927     ADMISSION DATE:  02/16/2017 CONSULTATION DATE:  02/01/2017  REFERRING MD:  Randa Spike  Summary:   83 yo male had fall at home. Found to have Rt acetabular and pelvic fracture. Orthopedics consulted, and decided for non operative management given age and comorbid conditions. He developed A fib with RVR and cardiology consulted. He developed progressive hypoxia with concern for aspiration pneumonia. He was transferred to ICU and required intubation. General surgery was consulted to assess incisional hernia, and no surgical intervention was needed. He continued to have increased oxygenation needs and not able to be weaned from vent. Family decided against additional aggressive measures. DNR  SUBJECTIVE:  No change in status  VITAL SIGNS: Pulse Rate:  [77-111] 78 (12/20 0900) Resp:  [18] 18 (12/20 0900) BP: (80-97)/(52-79) 85/60 (12/20 0900) SpO2:  [92 %-99 %] 96 % (12/20 0900) FiO2 (%):  [40 %] 40 % (12/20 0716)  PHYSICAL EXAMINATION: Gen:      No acute distress, frail, elderly HEENT:  EOMI, sclera anicteric Neck:     No masses; no thyromegaly, ETT in place Lungs:    Clear to auscultation bilaterally; normal respiratory effort CV:         Regular rate and rhythm; no murmurs Abd:      + bowel sounds; soft, non-tender; no palpable masses, no distension Ext:    No edema; adequate peripheral perfusion Skin:      Warm and dry; no rash Neuro: Sedated, intubated  No results for input(s): NA, K, CL, CO2, BUN, CREATININE, GLUCOSE in the last 168 hours. No results for input(s): HGB, HCT, WBC, PLT in the last 168 hours. No results found.  ASSESSMENT / PLAN: Acute hypoxic respiratory failure Aspiration pneumonia Comminuted displaced fracture Rt iliac wing Non displaced Rt inferior pubic ramus fracture Extraperitoneal hemorrhage from pelvic fracture COPD A fib with RVR Hx of Depression Hx of duodenal ulcer Hx of  hypertension Incisional hernia  Plan: Continue full vent support No blood draws or imaging Continue Ativan as needed, Dilaudid for comfort Valium every 12  Global: Prognosis is poor.  Awaiting family to arrive from [place] prior to transitioning to full comfort care;  however there is concern how long patient will be able to sustain.  Intubated since 12/3.   Appreciate help from palliative care with difficult family discussions. Met with family yesterday and told them we will plan on extubating Friday unless we hear that the visa for daughter has been approved.   The patient is critically ill with multiple organ system failure and requires high complexity decision making for assessment and support, frequent evaluation and titration of therapies, advanced monitoring, review of radiographic studies and interpretation of complex data.   Critical Care Time devoted to patient care services, exclusive of separately billable procedures, described in this note is 35 minutes.   Marshell Garfinkel MD [facility] Pager [phone] If no answer or after 3pm call: [phone] 02/19/2017, 10:58 AM

## 2017-02-20 DIAGNOSIS — Z515 Encounter for palliative care: Secondary | ICD-10-CM

## 2017-02-20 MED ORDER — MIDAZOLAM HCL 2 MG/2ML IJ SOLN
INTRAMUSCULAR | Status: AC
  Administered 2017-02-20: 2 mg
  Filled 2017-02-20: qty 2

## 2017-03-03 NOTE — Procedures (Signed)
Extubation Procedure Note  Patient Details:   Name: Neil Terry DOB: [birth year] MRN: [id number]   Airway Documentation:     Evaluation  O2 sats: currently acceptable Complications: No apparent complications Patient did tolerate procedure well. Bilateral Breath Sounds: Diminished   No, Pt terminally extubated per MD order, placed on 5L [place] for family comfort  Neil Terry, Neil Terry 2016/12/03, 4:31 PM

## 2017-03-03 NOTE — Progress Notes (Signed)
Daily Progress Note   Patient Name: Neil Terry       Date: 2017-02-28 DOB: 1927-08-26  Age: 84 y.o. MRN#: [id number] Attending Physician: Marshell Garfinkel, MD Primary Care Physician: Patient, No Pcp Per Admit Date: 02/03/2017  Reason for Consultation/Follow-up: Establishing goals of care, Terminal Care and Withdrawal of life-sustaining treatment  Subjective: Neil Terry is on vent but comfortable with dilaudid infusion.   Length of Stay: 19  Current Medications: Scheduled Meds:  . chlorhexidine gluconate (MEDLINE KIT)  15 mL Mouth Rinse BID  . diazepam  5 mg Intravenous Q12H  . mouth rinse  15 mL Mouth Rinse QID    Continuous Infusions: . sodium chloride 10 mL/hr at 2017-02-28 0600  . HYDROmorphone 8 mg/hr (2017-02-28 1310)    PRN Meds: acetaminophen, HYDROmorphone  Physical Exam  Constitutional: He is intubated.  Thin, frail, elderly  Cardiovascular: An irregularly irregular rhythm present. Tachycardia present.  Pulmonary/Chest: He is intubated. No respiratory distress.  Abdominal: Normal appearance.  Neurological: He is unresponsive.  Nursing note and vitals reviewed.           Vital Signs: BP (!) 74/54   Pulse 78   Temp 100.1 F (37.8 C) (Axillary)   Resp 18   Ht '5\' 6"'$  (1.676 m)   Wt 69.6 kg (153 lb 7 oz)   SpO2 98%   BMI 24.77 kg/m  SpO2: SpO2: 98 % O2 Device: O2 Device: Ventilator O2 Flow Rate: O2 Flow Rate (L/min): 10 L/min  Intake/output summary:   Intake/Output Summary (Last 24 hours) at 02-28-2017 1705 Last data filed at 28-Feb-2017 1500 Gross per 24 hour  Intake 828 ml  Output 470 ml  Net 358 ml   LBM: Last BM Date: (PTA) Baseline Weight: Weight: 73.5 kg (162 lb) Most recent weight: Weight: 69.6 kg (153 lb 7 oz)       Palliative  Assessment/Data: 10%   Flowsheet Rows     Most Recent Value  Intake Tab  Referral Department  Critical care  Unit at Time of Referral  ICU  Palliative Care Primary Diagnosis  Trauma  Date Notified  02/09/17  Palliative Care Type  New Palliative care  Reason for referral  Clarify Goals of Care  Date of Admission  02/22/2017  Date first seen by Palliative Care  02/10/17  # of  days Palliative referral response time  1 Day(s)  # of days IP prior to Palliative referral  9  Clinical Assessment  Palliative Performance Scale Score  10%  Psychosocial & Spiritual Assessment  Palliative Care Outcomes  Patient/Family meeting held?  Yes  Who was at the meeting?  multiple family members including sons  Palliative Care Outcomes  Clarified goals of care, Provided end of life care assistance, Provided psychosocial or spiritual support      Patient Active Problem List   Diagnosis Date Noted  . Malnutrition of moderate degree 02/18/2017  . Terminal care   . Pressure injury of skin 02/14/2017  . Fracture   . Advance care planning   . Palliative care by specialist   . Goals of care, counseling/discussion   . Closed fracture of both anterior and posterior columns of acetabulum ([place]) 02/05/2017  . Pelvic fracture ([place]) 02/01/2017  . Hip fracture requiring operative repair, right, closed, initial encounter ([place]) 02/01/2017  . H/O: GI bleed 02/01/2017  . Traumatic retroperitoneal hemorrhage 02/01/2017  . Coronary artery calcification seen on CAT scan   . Abnormal nuclear stress test   . Dyspnea on exertion 04/09/2016  . Bowel obstruction ([place]) 04/09/2016  . Abdominal pain   . Hernia, abdominal 01/25/2016  . GERD without esophagitis 01/25/2016  . Incisional hernia   . Chronic anticoagulation 11/24/2014  . PAF (paroxysmal atrial fibrillation) ([place]) 11/10/2014  . Atrial fibrillation with RVR ([place]) 11/10/2014  . Chest pain 11/10/2014  . RUQ pain 11/10/2014  . Renal cyst, right 11/10/2014  .  AAA (abdominal aortic aneurysm) without rupture ([place]) 11/10/2014  . Ventral hernia 11/10/2014  . BPH (benign prostatic hyperplasia) 11/10/2014  . Nausea and vomiting 11/09/2014  . CAP (community acquired pneumonia) 11/09/2014  . COPD exacerbation ([place]) 11/09/2014  . Abdominal pain, epigastric 11/09/2014  . Right hip pain 11/08/2014  . Acute urinary retention 11/08/2014  . Acute on chronic respiratory failure with hypoxia ([place]) 11/08/2014  . Fall   . Hypoxia     Palliative Care Assessment & Plan   HPI: 84 y.o.malewith past medical history of COPD on home oxygen, hypertension, GERD, depression, BPH, duodenal ulcer, abdominal hernias with bowel obstruction, and fallsadmitted on 12/1/2018with right hip pain after a fall at home.In ED, xray revealed right acetabular and right iliopubic ring fracture. Shortly after arrival to ED, heart rate increased to 140's with rapid ventricular response and intermittent nonsustained ventricular tachycardia. Temp up to 100.6 rectally and with tachypnea. Patient intubated on 12/3 and has been unable to wean secondary to pain from pelvic and rib fracture. Changed to limited code on 12/11. Plan is for one-way extubation. Palliative medicine consultation for goals of care/terminal care.  Assessment: I met today with Neil Terry 4 sons ([name], [place], [place], and [place]) and daughter who were present along with Dr. Vaughan Browner. Questions answered and decision made for extubation today. Family struggling with decision and have made clear that they will trust the medical team to proceed with what is necessary.   I was present with family when Neil Terry was extubated with family, RN, RT. Extubated to comfort on dilaudid infusion and total 4 mg Versed provided. Provided comfort and guidance to family after extubation and ensured comfort. Neil Terry pronounced dead 65 with RN Apolonio Schneiders and family notified at bedside. RN to continue support to family for their religious  traditions surrounding EOL and death.   Recommendations/Plan:  Dilaudid infusion continued with bolus as needed.   Goals of Care and Additional  Recommendations:  Limitations on Scope of Treatment: Full Comfort Care  Code Status:  DNR  Prognosis:   Hours - Days  Discharge Planning:  Anticipated Hospital Death   Thank you for allowing the Palliative Medicine Team to assist in the care of this patient.   Time In: 1600 Time Out: 1720 Total Time 80 min Prolonged Time Billed  no       Greater than 50%  of this time was spent counseling and coordinating care related to the above assessment and plan.  Vinie Sill, NP Palliative Medicine Team Pager # [phone] (M-F 8a-5p) Team Phone # [phone] (Nights/Weekends)

## 2017-03-03 NOTE — Progress Notes (Signed)
[facility] Time of death [id number].

## 2017-03-03 NOTE — Discharge Summary (Signed)
Physician Discharge Summary  Patient ID: Neil MinksFarid Terry MRN: [id number] DOB/AGE: 1928-01-30 84 y.o.  Admit date: 02/25/2017 Discharge date: 02/27/2017  Admission Diagnoses: Fall with   Discharge Diagnoses:  Principal Problem:   Acute on chronic respiratory failure with hypoxia (HCC) Active Problems:   COPD exacerbation (HCC)   PAF (paroxysmal atrial fibrillation) (HCC)   Atrial fibrillation with RVR (HCC)   BPH (benign prostatic hyperplasia)   Pelvic fracture (HCC)   Hip fracture requiring operative repair, right, closed, initial encounter [facility](HCC)   Traumatic retroperitoneal hemorrhage   Coronary artery calcification seen on CAT scan   Abnormal nuclear stress test   Closed fracture of both anterior and posterior columns of acetabulum (HCC)   Palliative care by specialist   Goals of care, counseling/discussion   Advance care planning   Fracture   Pressure injury of skin   Terminal care   Malnutrition of moderate degree   Palliative care encounter   Discharged Condition: Deceased  Hospital Course: This is an 84 year old with oxygen dependent COPD at baseline who suffered from a ground-level fall 12/1.  Imaging revealed right acetabular and pelvic fracture.  He was evaluated by orthopedics but felt that he was not a good candidate for any invasive intervention. He has h/o abdominal hernias with a bowel obstruction in November 2017 treated conservatively.    Surgery was consulted for chronically incarcerated hernia.  He is not a candidate for surgical treatment.  Course complicated by atrial fibrillation controlled by amiodarone drip and respiratory failure requiring mechanical ventilation.  Cardiology was consulted There were multiple goals of discussion with the family.  The family understood that his prognosis is poor and he has no chance of leaving the hospital to return to his prior condition.  Patient was in severe pain due to multiple fractures with difficulty keeping his pain  under control.  Palliative care was involved with goals of care discussion.  He was made DNR and care was withdrawn on 12/21.  He passed away shortly thereafter.  Consults: Orthopedics, cardiology, general surgery, palliative care.  Disposition: 20-Expired   Allergies as of 02/06/2017   No Known Allergies     Medication List    ASK your doctor about these medications   acetaminophen 500 MG tablet Commonly known as:  TYLENOL Take 500 mg by mouth every 6 (six) hours as needed for mild pain.   albuterol 108 (90 Base) MCG/ACT inhaler Commonly known as:  PROVENTIL HFA;VENTOLIN HFA Inhale 2 puffs into the lungs every 6 (six) hours as needed for wheezing or shortness of breath.   aspirin 81 MG EC tablet Take 81 mg by mouth daily.   budesonide-formoterol 80-4.5 MCG/ACT inhaler Commonly known as:  SYMBICORT Inhale 2 puffs into the lungs 2 (two) times daily.   busPIRone 7.5 MG tablet Commonly known as:  BUSPAR Take 7.5 mg by mouth 2 (two) times daily as needed (anxiety).   Cholecalciferol 1000 units tablet Take 2,000 Units by mouth daily.   esomeprazole 20 MG capsule Commonly known as:  NEXIUM Take 20 mg by mouth daily at 12 noon.   furosemide 20 MG tablet Commonly known as:  LASIX Take 20 mg by mouth.   gabapentin 300 MG capsule Commonly known as:  NEURONTIN Take 300 mg by mouth 3 (three) times daily.   ipratropium-albuterol 0.5-2.5 (3) MG/3ML Soln Commonly known as:  DUONEB Take 3 mLs by nebulization every 4 (four) hours as needed.   metoprolol tartrate 25 MG tablet Commonly known as:  LOPRESSOR Take  25 mg by mouth 2 (two) times daily.   polyethylene glycol packet Commonly known as:  MIRALAX / GLYCOLAX Take 17 g by mouth daily.   potassium chloride 10 MEQ tablet Commonly known as:  K-DUR Take 10 mEq by mouth daily.   pramipexole 0.25 MG tablet Commonly known as:  MIRAPEX Take 0.25 mg by mouth at bedtime.   senna-docusate 8.6-50 MG tablet Commonly known as:   Senokot-S Take 1 tablet by mouth daily as needed for mild constipation.   sertraline 50 MG tablet Commonly known as:  ZOLOFT Take 50 mg by mouth daily.   tamsulosin 0.4 MG Caps capsule Commonly known as:  FLOMAX Take 0.4 mg by mouth daily after supper.        Signed: Armaan Pond 02/27/2017, 3:58 AM

## 2017-03-03 NOTE — Progress Notes (Signed)
   Name: Neil Terry MRN: [id number] DOB: [birth year]     ADMISSION DATE:  02/24/2017 CONSULTATION DATE:  02/01/2017  REFERRING MD:  Randa Spike  Summary:   84 yo male had fall at home. Found to have Rt acetabular and pelvic fracture. Orthopedics consulted, and decided for non operative management given age and comorbid conditions. He developed A fib with RVR and cardiology consulted. He developed progressive hypoxia with concern for aspiration pneumonia. He was transferred to ICU and required intubation. General surgery was consulted to assess incisional hernia, and no surgical intervention was needed. He continued to have increased oxygenation needs and not able to be weaned from vent. Family decided against additional aggressive measures. DNR  SUBJECTIVE:  No change in status  VITAL SIGNS: Pulse Rate:  [67-138] 87 (12/21 0600) Resp:  [18] 18 (12/21 0700) BP: (71-100)/(54-87) 79/61 (12/21 0700) SpO2:  [94 %-98 %] 97 % (12/21 0724) FiO2 (%):  [40 %] 40 % (12/21 0724) Weight:  [153 lb 7 oz (69.6 kg)] 153 lb 7 oz (69.6 kg) (12/21 0300)  PHYSICAL EXAMINATION: Gen:      Frail, elderly HEENT:  EOMI, sclera anicteric, ETT in place Neck:     No masses; no thyromegaly Lungs:    Clear to auscultation bilaterally; normal respiratory effort CV:         Regular rate and rhythm; no murmurs Abd:      + bowel sounds; soft, non-tender; no palpable masses, no distension Ext:    No edema; adequate peripheral perfusion Skin:      Warm and dry; no rash Neuro: Sedated, unresponsive  No results for input(s): NA, K, CL, CO2, BUN, CREATININE, GLUCOSE in the last 168 hours. No results for input(s): HGB, HCT, WBC, PLT in the last 168 hours. No results found.  ASSESSMENT / PLAN: Acute hypoxic respiratory failure Aspiration pneumonia Comminuted displaced fracture Rt iliac wing Non displaced Rt inferior pubic ramus fracture Extraperitoneal hemorrhage from pelvic fracture COPD A fib  with RVR Hx of Depression Hx of duodenal ulcer Hx of hypertension Incisional hernia  Plan: Continue full vent support No blood draws or imaging Continue Ativan as needed, Dilaudid for comfort Valium every 12  Global: Prognosis is poor.  Awaiting family to arrive from [place] prior to transitioning to full comfort care;  however there is concern how long patient will be able to sustain.  Intubated since 12/3.   Appreciate help from palliative care with difficult family discussions. Met with family yesterday and we will plan on extubating Friday unless we hear that the visa for daughter has been approved. May need ethics to get involved  The patient is critically ill with multiple organ system failure and requires high complexity decision making for assessment and support, frequent evaluation and titration of therapies, advanced monitoring, review of radiographic studies and interpretation of complex data.   Critical Care Time devoted to patient care services, exclusive of separately billable procedures, described in this note is 35 minutes.   Marshell Garfinkel MD [facility] Pager [phone] If no answer or after 3pm call: [phone] 05-Mar-2017, 9:31 AM

## 2017-03-03 NOTE — Progress Notes (Signed)
Pt. Extubated and transitioned to comfort care. Pt's dignity and comfort maintained. Pt's family at bedside. Pt resting comfortably. Will continue to closely monitor.

## 2017-03-03 DEATH — deceased

## 2017-04-16 IMAGING — CT CT PELVIS W/O CM
1 of 4 series · 7 of 46 positions shown, 8 images · non-contrast
Comparison: Pelvis and right hip radiographs 11/07/2014

CLINICAL DATA: Right hip pain after a fall today.

EXAM:
CT PELVIS AND RIGHT LOWER EXTREMITY WITHOUT CONTRAST
TECHNIQUE: Multidetector CT imaging of the pelvis and right lower extremity was
performed according to the standard protocol without intravenous
contrast. Multiplanar CT image reconstructions were also generated.

[Series 202: soft tissue · axial · 0.56mm/px · z∈[-79,+155]mm · 7 of 145 slices shown, 8 images]
[im 14/145  soft-tissue]
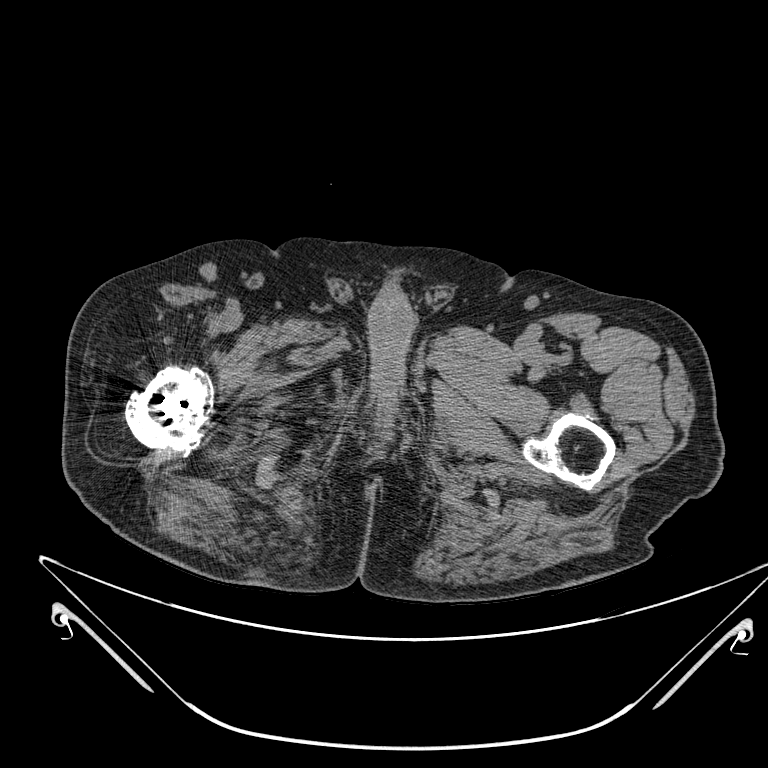
[im 14/145  bone]
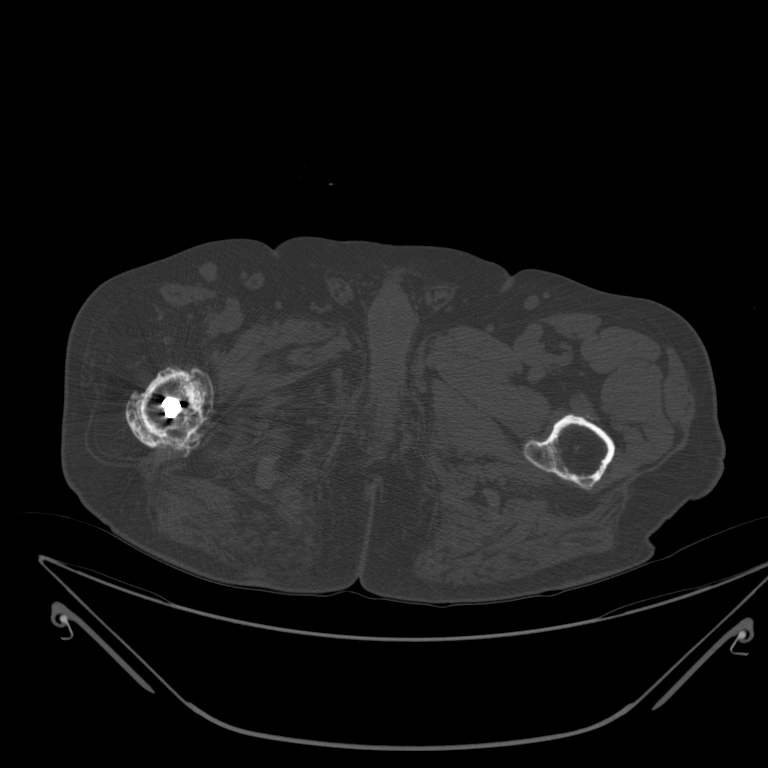
[im 33/145  soft-tissue]
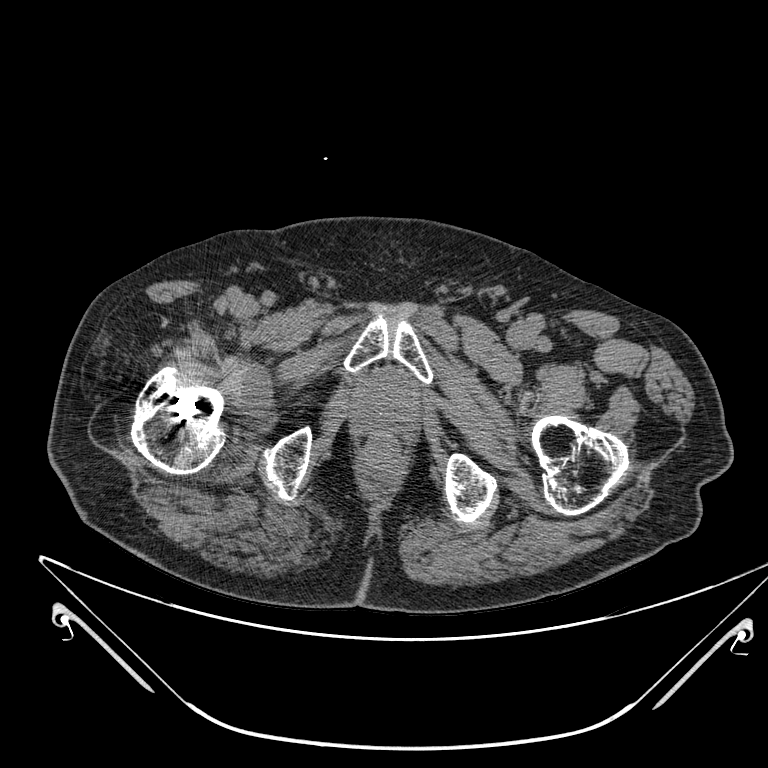
[im 52/145  soft-tissue]
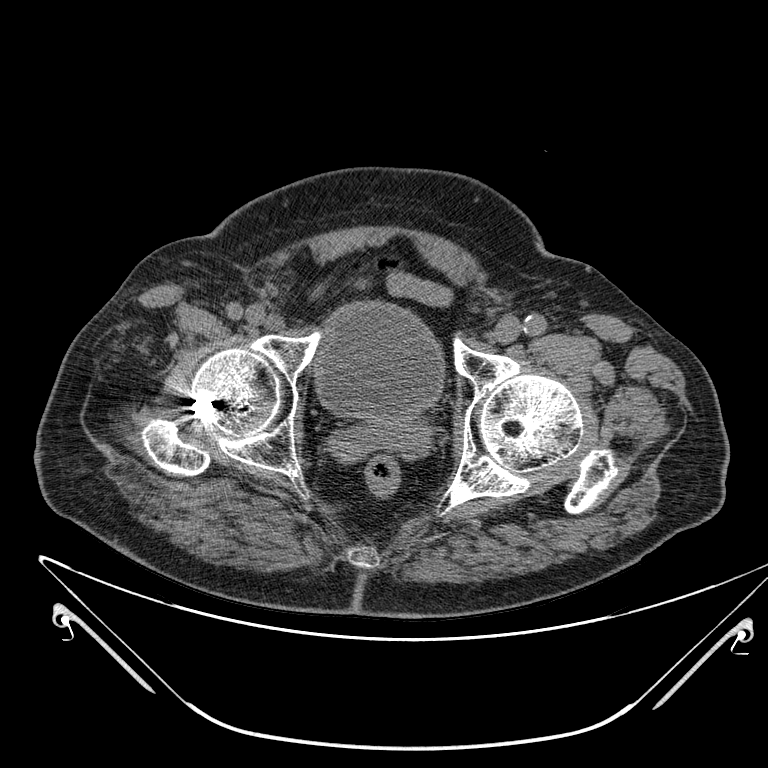
[im 75/145  soft-tissue]
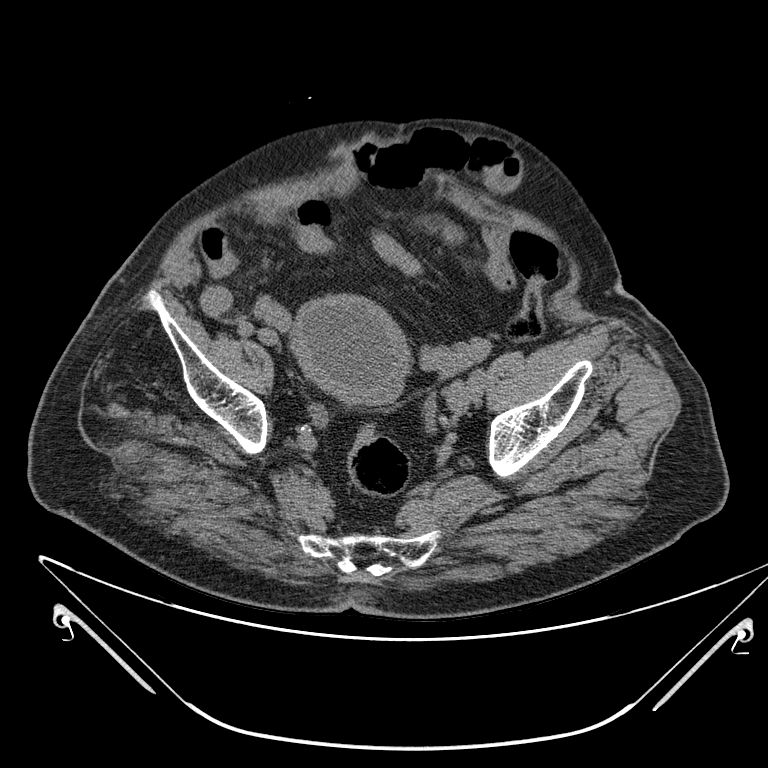
[im 93/145  soft-tissue]
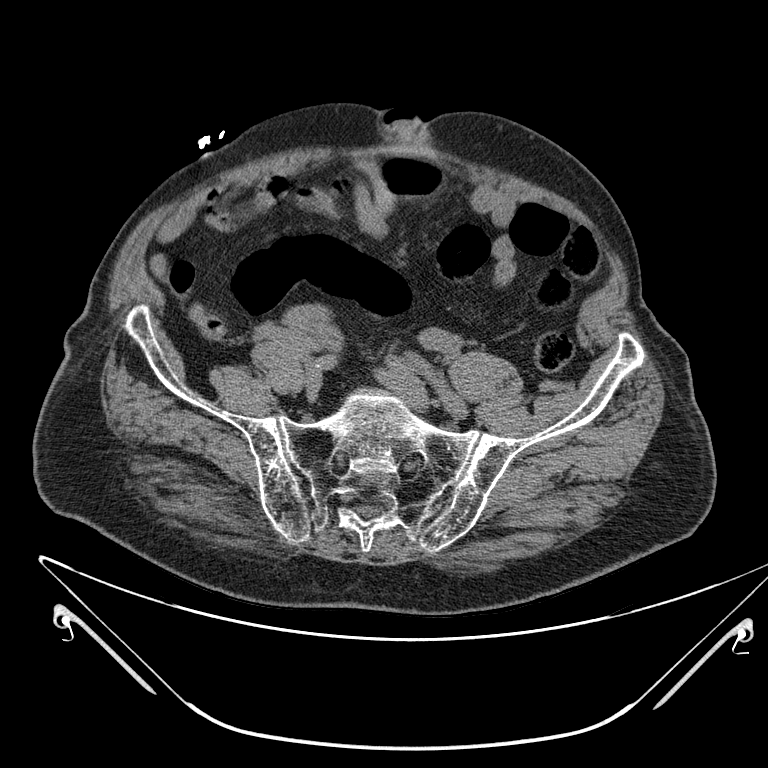
[im 112/145  soft-tissue]
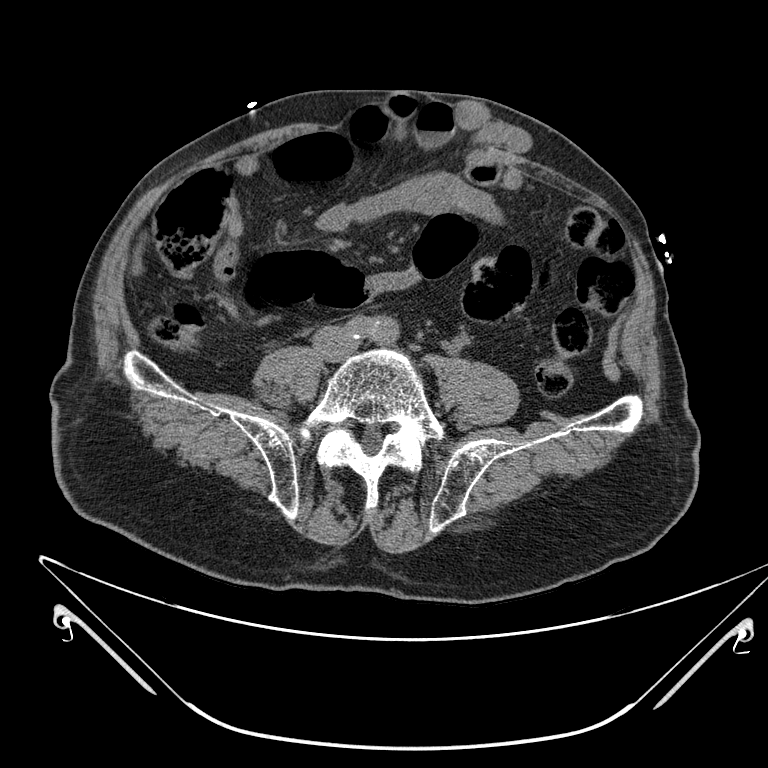
[im 131/145  soft-tissue]
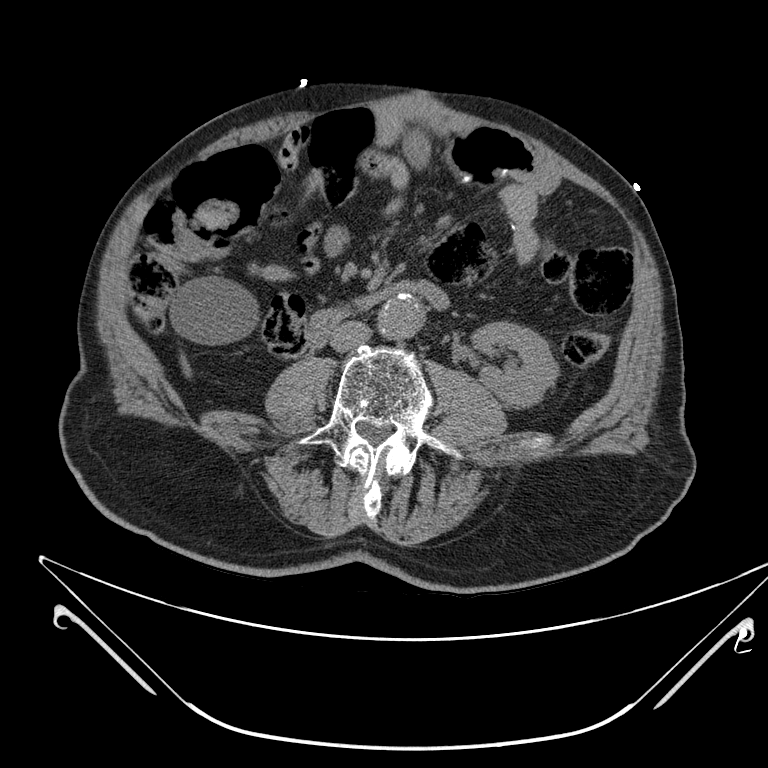

[7 of 46 positions shown; findings below may reference images not displayed]

FINDINGS: CT PELVIS FINDINGS

Diffuse bone demineralization. The sacrum and SI joints appear
intact and symmetrical. Pelvis appears intact. No acute displaced
fractures identified. Symphysis pubis is not displaced. No focal
bone lesion or bone destruction. Incidental note of cement at L3
consistent with prior kyphoplasty. Compression of the superior and
inferior endplates at L4. Changes are likely due to osteoporosis.

Soft tissue pelvis demonstrates vascular calcification in the
abdominal aorta. There are multiple ventral abdominal wall hernias
containing colon and small bowel. No visualized bowel obstruction.
Bladder wall is mildly thickened which may be due to cystitis or
hypertrophy from outlet obstruction. Prostate gland is enlarged and
prostate calcifications are present.

CT RIGHT LOWER EXTREMITY FINDINGS

Diffuse bone demineralization. Previous intra medullary rod fixation
of the femur with distal locking screw. No evidence of displacement
of hardware components. Old healed fracture deformity of the inter
trochanteric region. No evidence of acute fracture or dislocation of
the right femur. No focal bone lesion or bone destruction. No
significant effusion at the knee. Soft tissues of the right leg
demonstrate diffuse muscular fatty atrophy. Scattered vascular
calcifications in the superficial femoral artery. No focal fluid
collection or hematoma.
IMPRESSION: Diffuse bone demineralization. Lower lumbar compression fractures
consistent with osteoporosis. No acute fractures demonstrated in the
pelvis or right femur. Previous intra medullary rod fixation of the
right femur with and old healed fracture of the inter trochanteric
region. Diffuse fatty atrophy of the right leg muscles.

## 2017-04-16 IMAGING — CT CT FEMUR *R* W/O CM
2 of 3 series · 4 of 33 positions shown, 5 images · non-contrast
Comparison: Pelvis and right hip radiographs 11/07/2014

CLINICAL DATA: Right hip pain after a fall today.

EXAM:
CT PELVIS AND RIGHT LOWER EXTREMITY WITHOUT CONTRAST
TECHNIQUE: Multidetector CT imaging of the pelvis and right lower extremity was
performed according to the standard protocol without intravenous
contrast. Multiplanar CT image reconstructions were also generated.

[Series 203: soft tissue · axial · 0.31mm/px · z∈[-197,-197]mm · 1 of 259 slices shown, 2 images]
[im 139/259  soft-tissue]
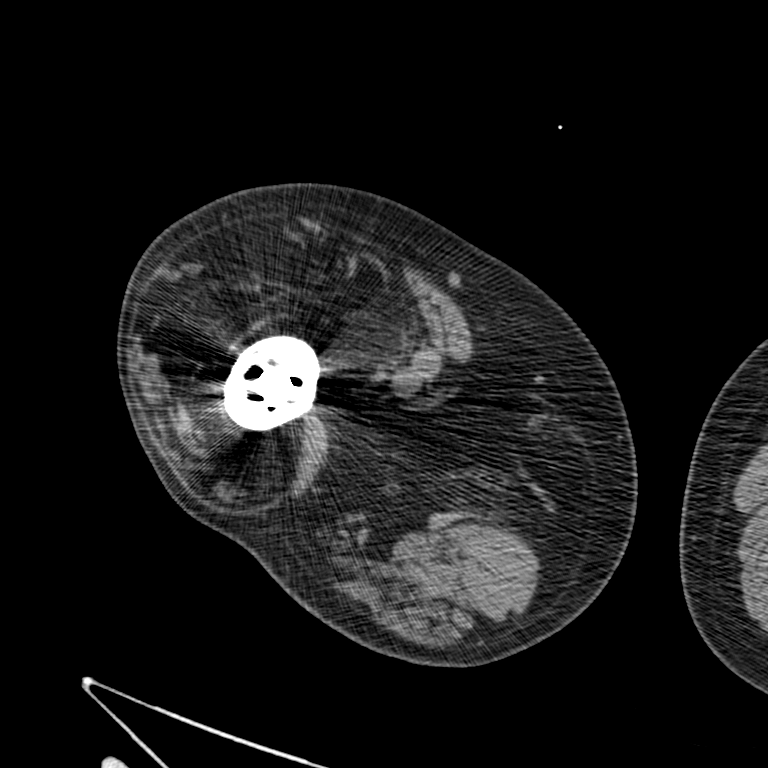
[im 139/259  bone]
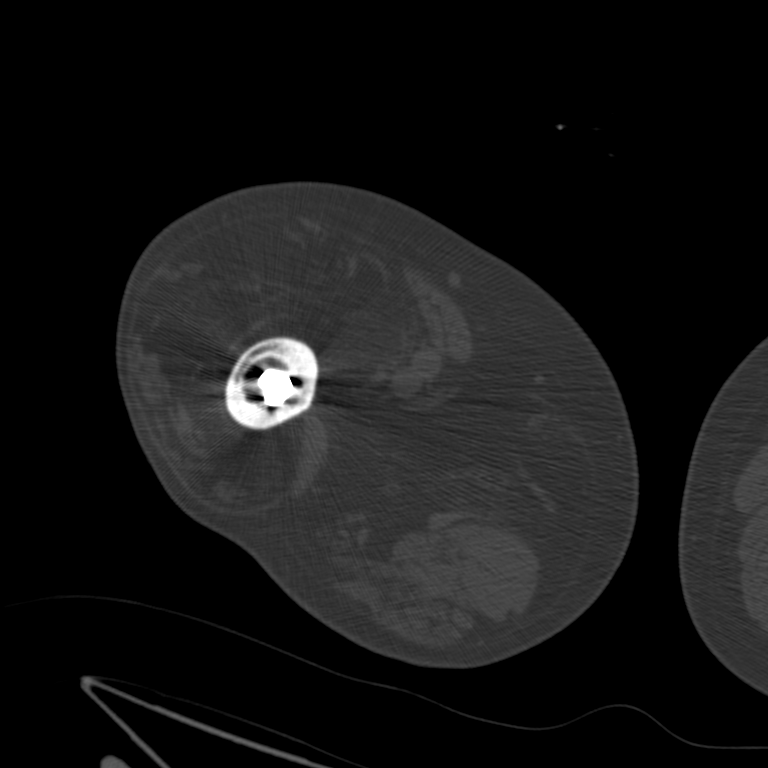

[Series 206: coronal soft · coronal · 0.31mm/px · 3 of 108 slices shown]
[im 22/108  bone]
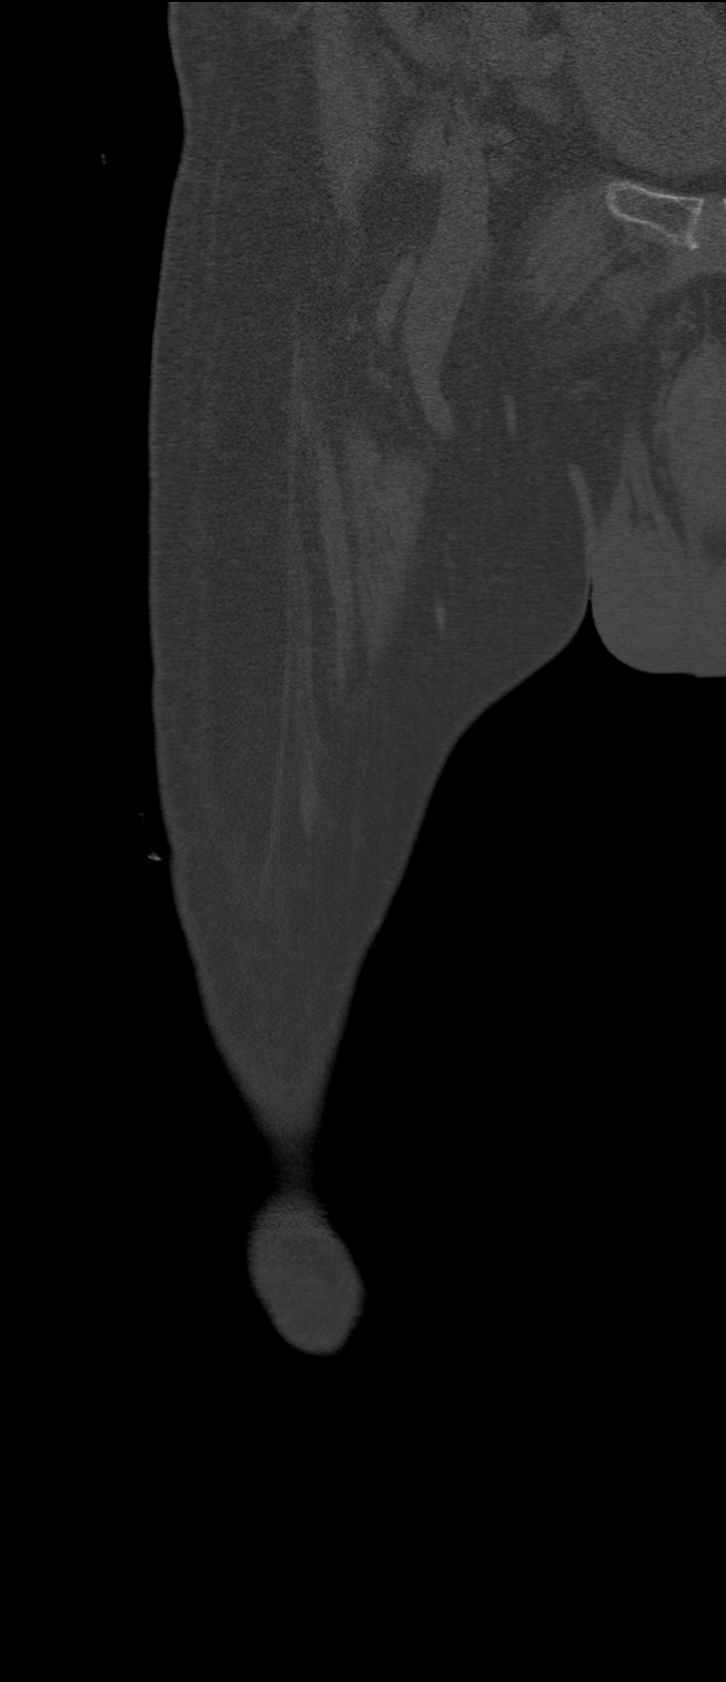
[im 43/108  bone]
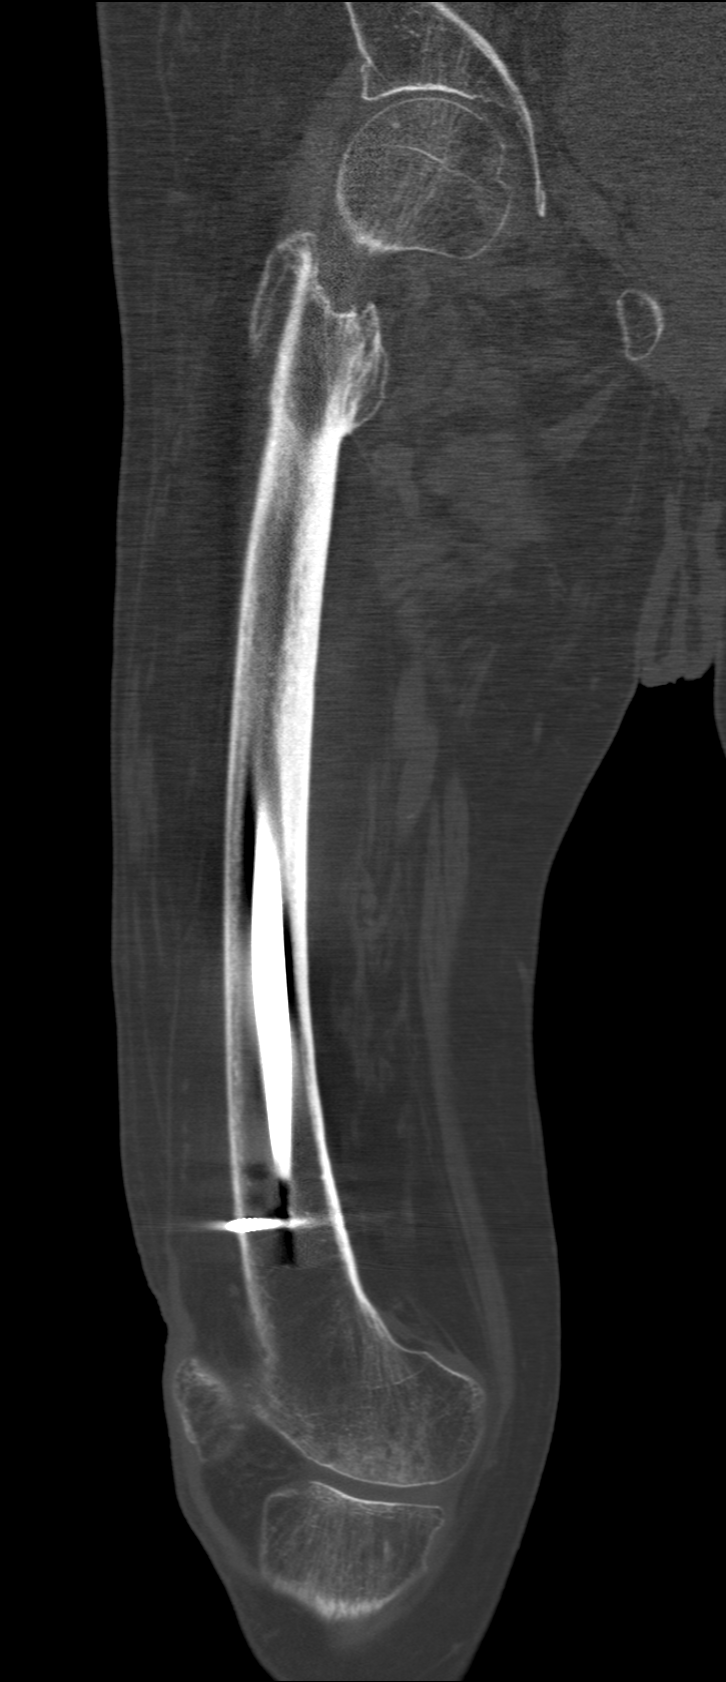
[im 65/108  bone]
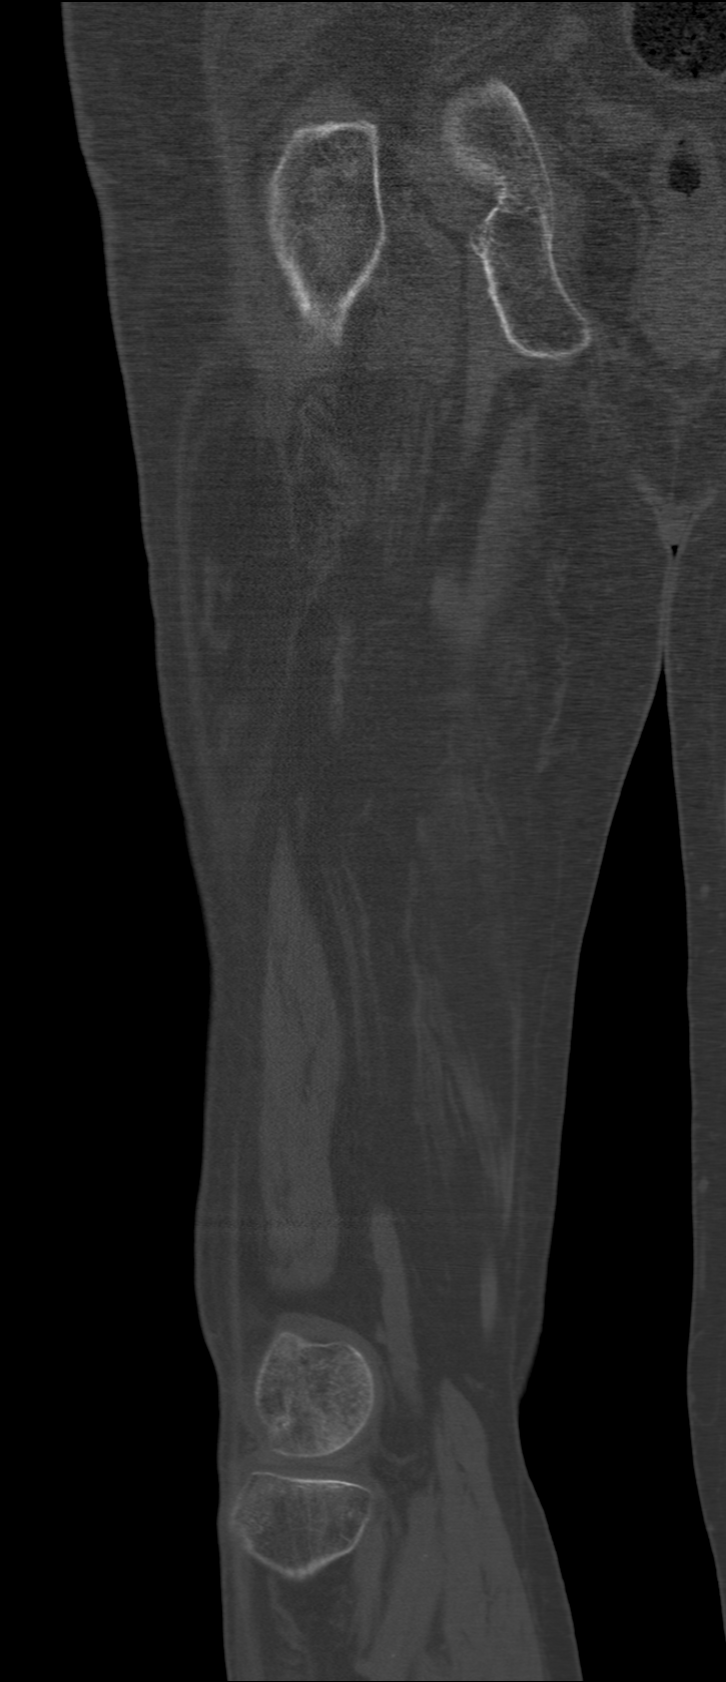

[4 of 33 positions shown; findings below may reference images not displayed]

FINDINGS: CT PELVIS FINDINGS

Diffuse bone demineralization. The sacrum and SI joints appear
intact and symmetrical. Pelvis appears intact. No acute displaced
fractures identified. Symphysis pubis is not displaced. No focal
bone lesion or bone destruction. Incidental note of cement at L3
consistent with prior kyphoplasty. Compression of the superior and
inferior endplates at L4. Changes are likely due to osteoporosis.

Soft tissue pelvis demonstrates vascular calcification in the
abdominal aorta. There are multiple ventral abdominal wall hernias
containing colon and small bowel. No visualized bowel obstruction.
Bladder wall is mildly thickened which may be due to cystitis or
hypertrophy from outlet obstruction. Prostate gland is enlarged and
prostate calcifications are present.

CT RIGHT LOWER EXTREMITY FINDINGS

Diffuse bone demineralization. Previous intra medullary rod fixation
of the femur with distal locking screw. No evidence of displacement
of hardware components. Old healed fracture deformity of the inter
trochanteric region. No evidence of acute fracture or dislocation of
the right femur. No focal bone lesion or bone destruction. No
significant effusion at the knee. Soft tissues of the right leg
demonstrate diffuse muscular fatty atrophy. Scattered vascular
calcifications in the superficial femoral artery. No focal fluid
collection or hematoma.
IMPRESSION: Diffuse bone demineralization. Lower lumbar compression fractures
consistent with osteoporosis. No acute fractures demonstrated in the
pelvis or right femur. Previous intra medullary rod fixation of the
right femur with and old healed fracture of the inter trochanteric
region. Diffuse fatty atrophy of the right leg muscles.

## 2018-07-04 IMAGING — CR DG CHEST 2V
2 series · 3 of 3 positions shown · non-contrast
Comparison: 11/08/2014

CLINICAL DATA: Shortness of Breath

EXAM:
CHEST  2 VIEW

[Series 2: chest lat · 0.14mm/px · 2 of 2 slices shown]
[im 1/2]
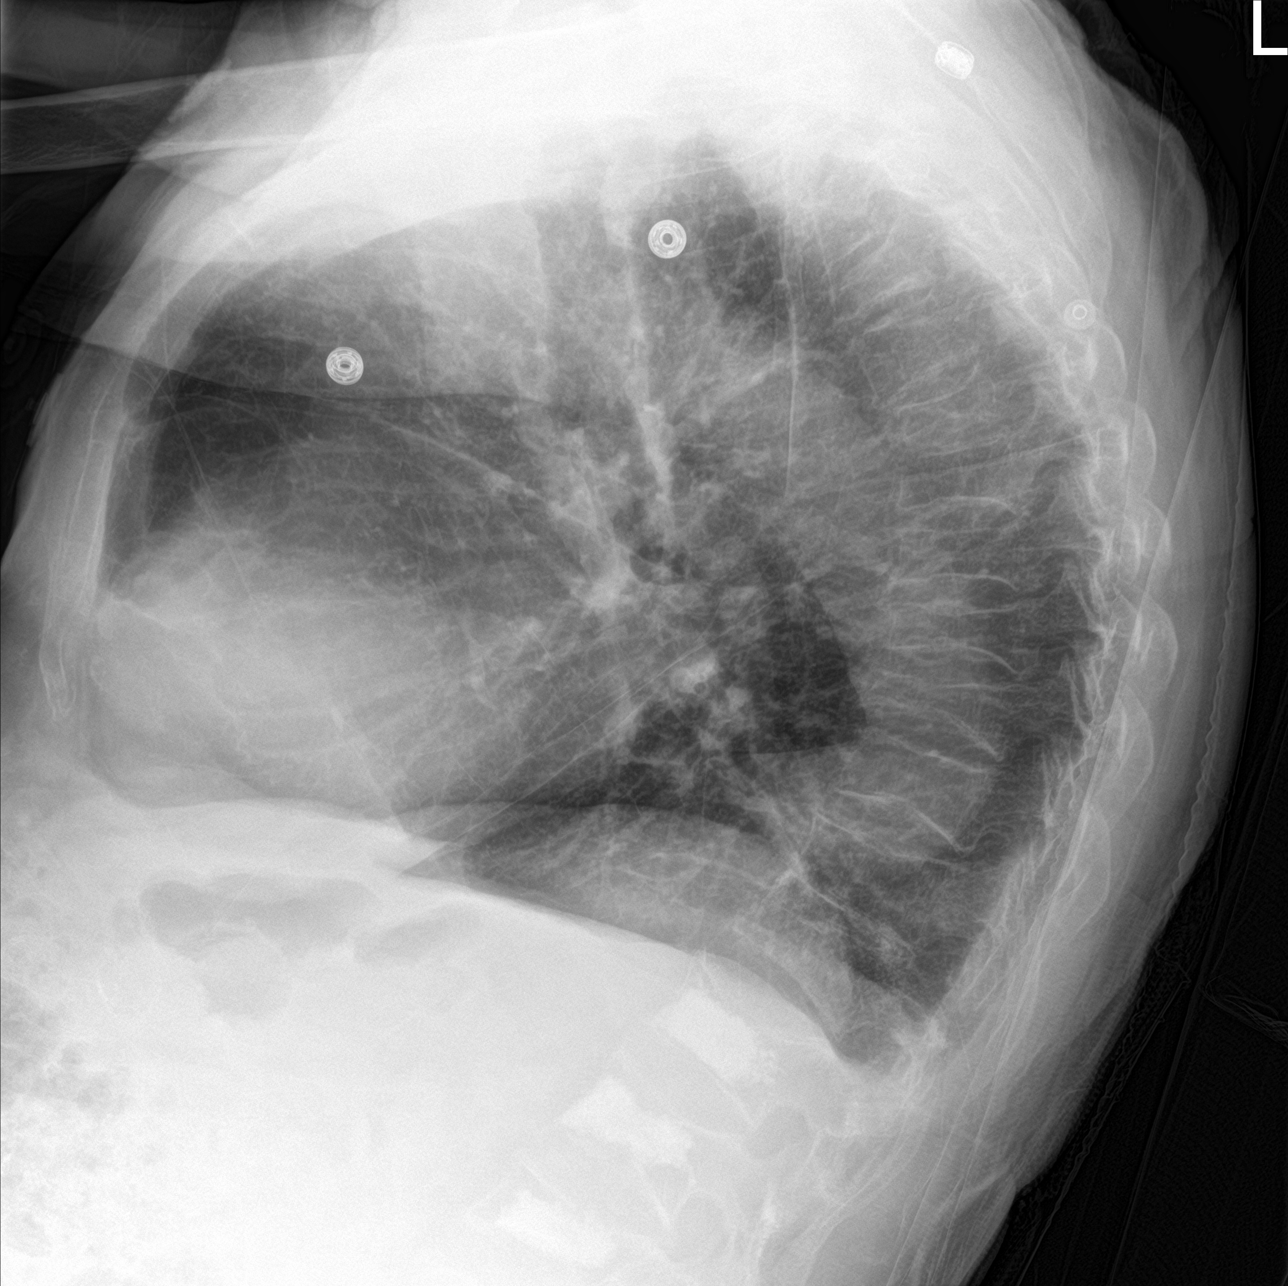
[im 2/2]
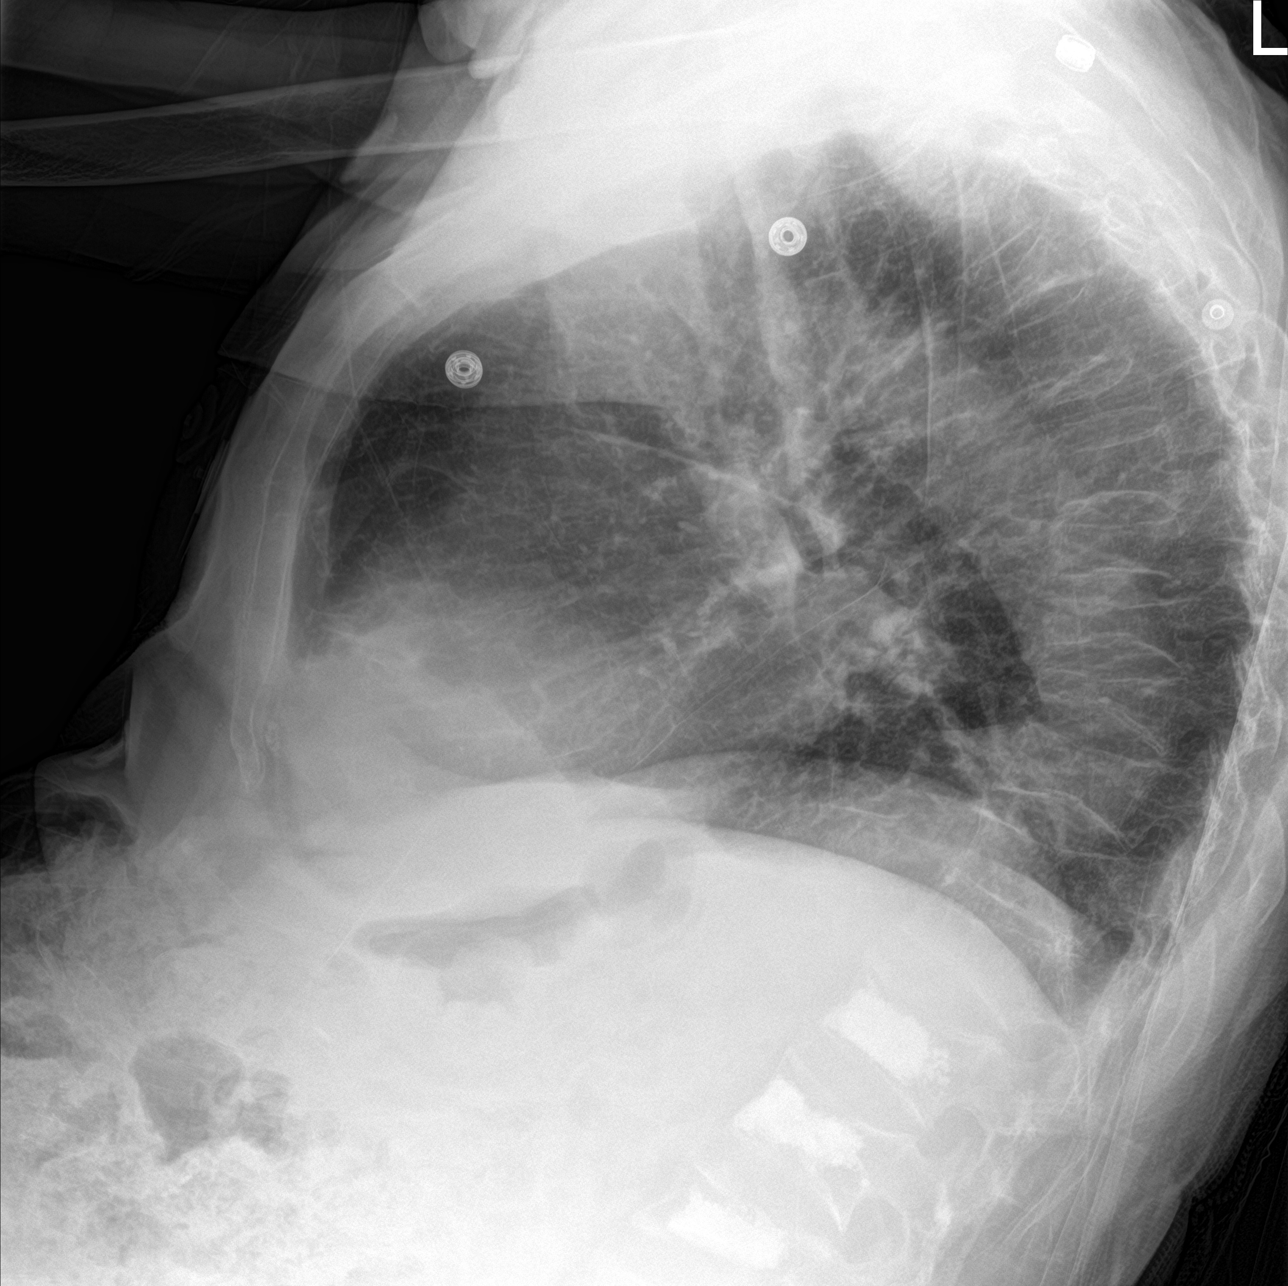

[chest ap]
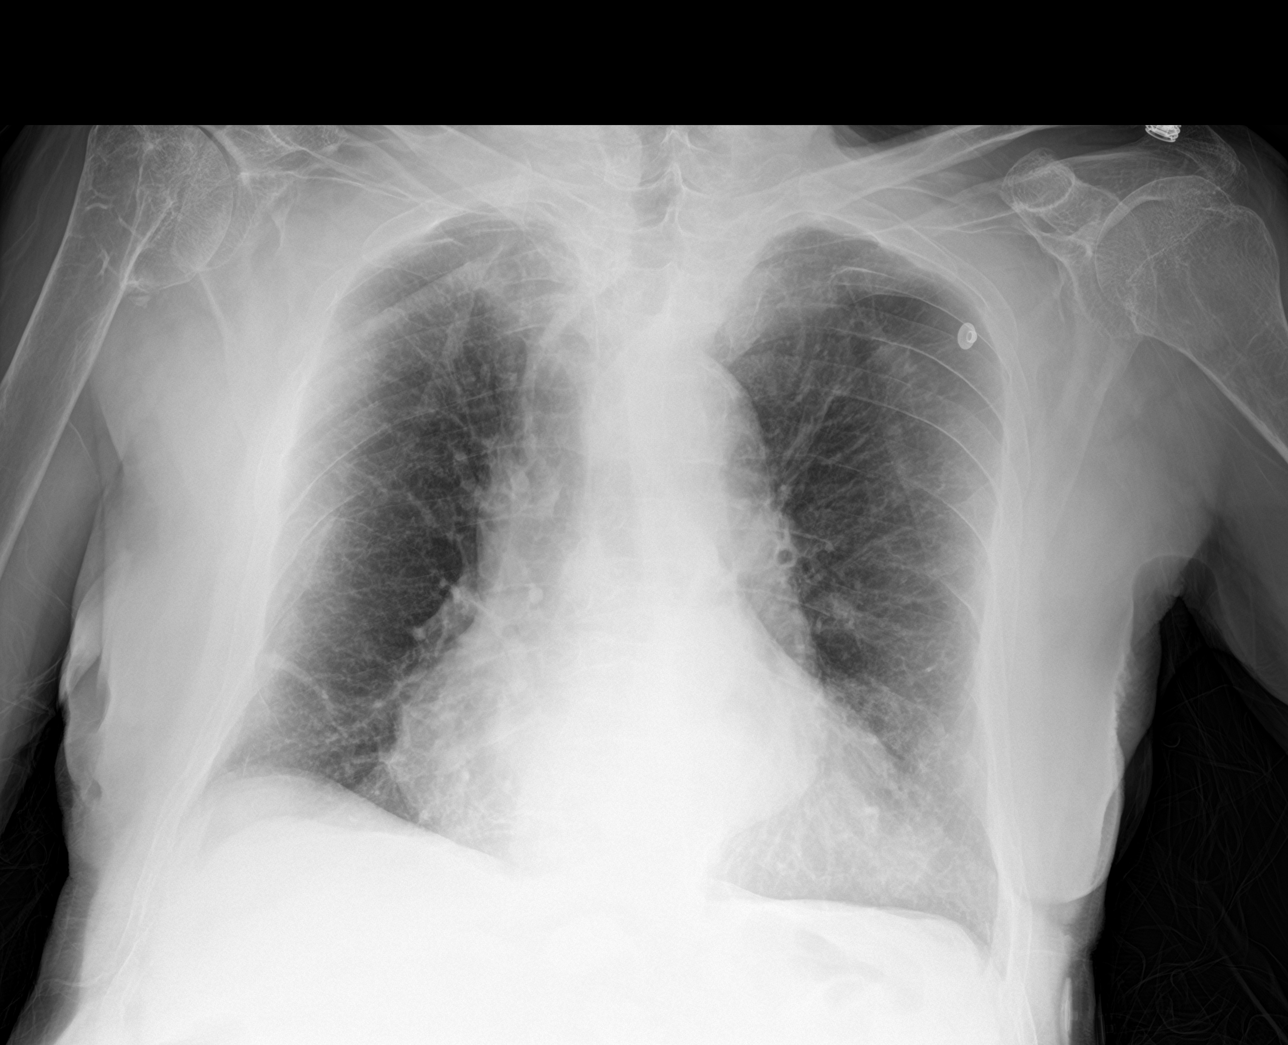

[3 of 3 positions shown; findings below may reference images not displayed]

FINDINGS: Cardiomegaly. Underline COPD. Linear scarring or atelectasis at the
right lung base. No effusions. Multiple moderate mid and lower
thoracic spine compression fractures, unchanged since prior CT
IMPRESSION: COPD.  Cardiomegaly.  No active disease.
